# Patient Record
Sex: Male | Born: 1950 | Race: White | Hispanic: No | Marital: Single | Smoking: Current every day smoker
Health system: Southern US, Community
[De-identification: ages and names within clinical notes are randomized; demographics above are authoritative.]

## PROBLEM LIST (undated history)

## (undated) DIAGNOSIS — N289 Disorder of kidney and ureter, unspecified: Secondary | ICD-10-CM

## (undated) DIAGNOSIS — J189 Pneumonia, unspecified organism: Secondary | ICD-10-CM

## (undated) DIAGNOSIS — I509 Heart failure, unspecified: Secondary | ICD-10-CM

## (undated) DIAGNOSIS — D649 Anemia, unspecified: Secondary | ICD-10-CM

## (undated) DIAGNOSIS — Z992 Dependence on renal dialysis: Secondary | ICD-10-CM

## (undated) DIAGNOSIS — Z972 Presence of dental prosthetic device (complete) (partial): Secondary | ICD-10-CM

## (undated) DIAGNOSIS — K08109 Complete loss of teeth, unspecified cause, unspecified class: Secondary | ICD-10-CM

## (undated) DIAGNOSIS — J449 Chronic obstructive pulmonary disease, unspecified: Secondary | ICD-10-CM

## (undated) DIAGNOSIS — I639 Cerebral infarction, unspecified: Secondary | ICD-10-CM

## (undated) DIAGNOSIS — I10 Essential (primary) hypertension: Secondary | ICD-10-CM

## (undated) HISTORY — PX: MULTIPLE TOOTH EXTRACTIONS: SHX2053

## (undated) HISTORY — PX: COLON SURGERY: SHX602

---

## 2017-09-11 ENCOUNTER — Encounter (HOSPITAL_COMMUNITY): Payer: Self-pay | Admitting: *Deleted

## 2017-09-11 ENCOUNTER — Emergency Department (HOSPITAL_COMMUNITY): Payer: Medicare Other

## 2017-09-11 ENCOUNTER — Inpatient Hospital Stay (HOSPITAL_COMMUNITY)
Admission: EM | Admit: 2017-09-11 | Discharge: 2017-09-17 | DRG: 690 | Disposition: A | Discharge status: Home / Self Care | Payer: Medicare Other | Attending: Family Medicine | Admitting: Family Medicine

## 2017-09-11 ENCOUNTER — Inpatient Hospital Stay (HOSPITAL_COMMUNITY): Payer: Medicare Other

## 2017-09-11 DIAGNOSIS — Z1611 Resistance to penicillins: Secondary | ICD-10-CM | POA: Diagnosis present

## 2017-09-11 DIAGNOSIS — N10 Acute tubulo-interstitial nephritis: Secondary | ICD-10-CM | POA: Diagnosis present

## 2017-09-11 DIAGNOSIS — Z590 Homelessness: Secondary | ICD-10-CM | POA: Diagnosis not present

## 2017-09-11 DIAGNOSIS — I132 Hypertensive heart and chronic kidney disease with heart failure and with stage 5 chronic kidney disease, or end stage renal disease: Secondary | ICD-10-CM | POA: Diagnosis present

## 2017-09-11 DIAGNOSIS — R338 Other retention of urine: Secondary | ICD-10-CM | POA: Diagnosis present

## 2017-09-11 DIAGNOSIS — I701 Atherosclerosis of renal artery: Secondary | ICD-10-CM | POA: Diagnosis present

## 2017-09-11 DIAGNOSIS — I1311 Hypertensive heart and chronic kidney disease without heart failure, with stage 5 chronic kidney disease, or end stage renal disease: Secondary | ICD-10-CM | POA: Insufficient documentation

## 2017-09-11 DIAGNOSIS — I5032 Chronic diastolic (congestive) heart failure: Secondary | ICD-10-CM | POA: Diagnosis present

## 2017-09-11 DIAGNOSIS — D631 Anemia in chronic kidney disease: Secondary | ICD-10-CM | POA: Diagnosis present

## 2017-09-11 DIAGNOSIS — I160 Hypertensive urgency: Secondary | ICD-10-CM | POA: Diagnosis present

## 2017-09-11 DIAGNOSIS — Z5329 Procedure and treatment not carried out because of patient's decision for other reasons: Secondary | ICD-10-CM | POA: Diagnosis not present

## 2017-09-11 DIAGNOSIS — N401 Enlarged prostate with lower urinary tract symptoms: Secondary | ICD-10-CM | POA: Diagnosis present

## 2017-09-11 DIAGNOSIS — N179 Acute kidney failure, unspecified: Secondary | ICD-10-CM | POA: Diagnosis present

## 2017-09-11 DIAGNOSIS — N12 Tubulo-interstitial nephritis, not specified as acute or chronic: Secondary | ICD-10-CM

## 2017-09-11 DIAGNOSIS — R0602 Shortness of breath: Secondary | ICD-10-CM | POA: Diagnosis present

## 2017-09-11 DIAGNOSIS — N2889 Other specified disorders of kidney and ureter: Secondary | ICD-10-CM

## 2017-09-11 DIAGNOSIS — Z87891 Personal history of nicotine dependence: Secondary | ICD-10-CM | POA: Diagnosis not present

## 2017-09-11 DIAGNOSIS — J449 Chronic obstructive pulmonary disease, unspecified: Secondary | ICD-10-CM | POA: Diagnosis present

## 2017-09-11 DIAGNOSIS — I151 Hypertension secondary to other renal disorders: Secondary | ICD-10-CM

## 2017-09-11 DIAGNOSIS — I10 Essential (primary) hypertension: Secondary | ICD-10-CM

## 2017-09-11 DIAGNOSIS — N185 Chronic kidney disease, stage 5: Secondary | ICD-10-CM | POA: Diagnosis present

## 2017-09-11 DIAGNOSIS — F1010 Alcohol abuse, uncomplicated: Secondary | ICD-10-CM | POA: Diagnosis present

## 2017-09-11 DIAGNOSIS — B9620 Unspecified Escherichia coli [E. coli] as the cause of diseases classified elsewhere: Secondary | ICD-10-CM | POA: Diagnosis present

## 2017-09-11 DIAGNOSIS — N189 Chronic kidney disease, unspecified: Secondary | ICD-10-CM

## 2017-09-11 DIAGNOSIS — R0902 Hypoxemia: Secondary | ICD-10-CM

## 2017-09-11 DIAGNOSIS — N419 Inflammatory disease of prostate, unspecified: Secondary | ICD-10-CM

## 2017-09-11 HISTORY — DX: Disorder of kidney and ureter, unspecified: N28.9

## 2017-09-11 HISTORY — DX: Chronic obstructive pulmonary disease, unspecified: J44.9

## 2017-09-11 HISTORY — DX: Essential (primary) hypertension: I10

## 2017-09-11 HISTORY — DX: Heart failure, unspecified: I50.9

## 2017-09-11 HISTORY — DX: Tubulo-interstitial nephritis, not specified as acute or chronic: N12

## 2017-09-11 LAB — CBC
HEMATOCRIT: 32.8 % — AB (ref 39.0–52.0)
Hemoglobin: 10.8 g/dL — ABNORMAL LOW (ref 13.0–17.0)
MCH: 29.3 pg (ref 26.0–34.0)
MCHC: 32.9 g/dL (ref 30.0–36.0)
MCV: 88.9 fL (ref 78.0–100.0)
Platelets: 173 10*3/uL (ref 150–400)
RBC: 3.69 MIL/uL — ABNORMAL LOW (ref 4.22–5.81)
RDW: 15.6 % — ABNORMAL HIGH (ref 11.5–15.5)
WBC: 12.4 10*3/uL — AB (ref 4.0–10.5)

## 2017-09-11 LAB — COMPREHENSIVE METABOLIC PANEL
ALK PHOS: 83 U/L (ref 38–126)
ALT: 15 U/L — ABNORMAL LOW (ref 17–63)
ANION GAP: 9 (ref 5–15)
AST: 26 U/L (ref 15–41)
Albumin: 3.5 g/dL (ref 3.5–5.0)
BILIRUBIN TOTAL: 0.6 mg/dL (ref 0.3–1.2)
BUN: 42 mg/dL — AB (ref 6–20)
CALCIUM: 8.9 mg/dL (ref 8.9–10.3)
CO2: 19 mmol/L — ABNORMAL LOW (ref 22–32)
Chloride: 108 mmol/L (ref 101–111)
Creatinine, Ser: 3.64 mg/dL — ABNORMAL HIGH (ref 0.61–1.24)
GFR calc Af Amer: 18 mL/min — ABNORMAL LOW (ref >60)
GFR, EST NON AFRICAN AMERICAN: 16 mL/min — AB (ref >60)
GLUCOSE: 120 mg/dL — AB (ref 65–99)
POTASSIUM: 4.1 mmol/L (ref 3.5–5.1)
Sodium: 136 mmol/L (ref 135–145)
TOTAL PROTEIN: 7.2 g/dL (ref 6.5–8.1)

## 2017-09-11 LAB — URINALYSIS, ROUTINE W REFLEX MICROSCOPIC
BILIRUBIN URINE: NEGATIVE
Glucose, UA: NEGATIVE mg/dL
KETONES UR: NEGATIVE mg/dL
NITRITE: POSITIVE — AB
SPECIFIC GRAVITY, URINE: 1.011 (ref 1.005–1.030)
pH: 5 (ref 5.0–8.0)

## 2017-09-11 LAB — ETHANOL

## 2017-09-11 LAB — I-STAT CG4 LACTIC ACID, ED: LACTIC ACID, VENOUS: 0.72 mmol/L (ref 0.5–1.9)

## 2017-09-11 LAB — LIPASE, BLOOD: Lipase: 28 U/L (ref 11–51)

## 2017-09-11 MED ORDER — TAMSULOSIN HCL 0.4 MG PO CAPS
0.4000 mg | ORAL_CAPSULE | Freq: Every day | ORAL | Status: DC
  Administered 2017-09-11 – 2017-09-17 (×7): 0.4 mg via ORAL
  Filled 2017-09-11 (×7): qty 1

## 2017-09-11 MED ORDER — SODIUM CHLORIDE 0.9 % IV BOLUS (SEPSIS)
1000.0000 mL | Freq: Once | INTRAVENOUS | Status: DC
  Administered 2017-09-11: 1000 mL via INTRAVENOUS

## 2017-09-11 MED ORDER — DEXTROSE 5 % IV SOLN
1.0000 g | Freq: Once | INTRAVENOUS | Status: AC
  Administered 2017-09-11: 1 g via INTRAVENOUS
  Filled 2017-09-11: qty 10

## 2017-09-11 MED ORDER — LORAZEPAM 2 MG/ML IJ SOLN
1.0000 mg | Freq: Four times a day (QID) | INTRAMUSCULAR | Status: AC | PRN
  Administered 2017-09-11: 1 mg via INTRAVENOUS
  Filled 2017-09-11: qty 1

## 2017-09-11 MED ORDER — ONDANSETRON HCL 4 MG PO TABS: 4.0000 mg | ORAL_TABLET | Freq: Four times a day (QID) | ORAL | Status: DC | PRN

## 2017-09-11 MED ORDER — HEPARIN SODIUM (PORCINE) 5000 UNIT/ML IJ SOLN
5000.0000 [IU] | Freq: Three times a day (TID) | INTRAMUSCULAR | Status: DC
  Filled 2017-09-11 (×6): qty 1

## 2017-09-11 MED ORDER — ONDANSETRON HCL 4 MG/2ML IJ SOLN
4.0000 mg | Freq: Once | INTRAMUSCULAR | Status: AC
  Administered 2017-09-11: 4 mg via INTRAVENOUS
  Filled 2017-09-11: qty 2

## 2017-09-11 MED ORDER — LEVOFLOXACIN 500 MG PO TABS: 500.0000 mg | ORAL_TABLET | ORAL | 0 refills | Status: DC

## 2017-09-11 MED ORDER — IPRATROPIUM-ALBUTEROL 0.5-2.5 (3) MG/3ML IN SOLN
3.0000 mL | Freq: Four times a day (QID) | RESPIRATORY_TRACT | Status: DC
  Administered 2017-09-12: 3 mL via RESPIRATORY_TRACT
  Filled 2017-09-11: qty 3

## 2017-09-11 MED ORDER — LEVOFLOXACIN IN D5W 500 MG/100ML IV SOLN
500.0000 mg | INTRAVENOUS | Status: DC
  Administered 2017-09-11: 500 mg via INTRAVENOUS
  Filled 2017-09-11: qty 100

## 2017-09-11 MED ORDER — FOLIC ACID 1 MG PO TABS
1.0000 mg | ORAL_TABLET | Freq: Every day | ORAL | Status: DC
  Administered 2017-09-11 – 2017-09-17 (×7): 1 mg via ORAL
  Filled 2017-09-11 (×7): qty 1

## 2017-09-11 MED ORDER — ACETAMINOPHEN 325 MG PO TABS
650.0000 mg | ORAL_TABLET | Freq: Four times a day (QID) | ORAL | Status: DC | PRN
  Administered 2017-09-11 – 2017-09-15 (×3): 650 mg via ORAL
  Filled 2017-09-11 (×3): qty 2

## 2017-09-11 MED ORDER — IPRATROPIUM-ALBUTEROL 0.5-2.5 (3) MG/3ML IN SOLN
3.0000 mL | Freq: Once | RESPIRATORY_TRACT | Status: AC
  Administered 2017-09-11: 3 mL via RESPIRATORY_TRACT
  Filled 2017-09-11: qty 3

## 2017-09-11 MED ORDER — ACETAMINOPHEN 650 MG RE SUPP: 650.0000 mg | Freq: Four times a day (QID) | RECTAL | Status: DC | PRN

## 2017-09-11 MED ORDER — HYDRALAZINE HCL 25 MG PO TABS
25.0000 mg | ORAL_TABLET | Freq: Once | ORAL | Status: AC
  Administered 2017-09-11: 25 mg via ORAL
  Filled 2017-09-11: qty 1

## 2017-09-11 MED ORDER — THIAMINE HCL 100 MG/ML IJ SOLN: 100.0000 mg | Freq: Every day | INTRAMUSCULAR | Status: DC

## 2017-09-11 MED ORDER — ONDANSETRON HCL 4 MG/2ML IJ SOLN
4.0000 mg | Freq: Four times a day (QID) | INTRAMUSCULAR | Status: DC | PRN
  Administered 2017-09-12: 4 mg via INTRAVENOUS
  Filled 2017-09-11 (×2): qty 2

## 2017-09-11 MED ORDER — ADULT MULTIVITAMIN W/MINERALS CH
1.0000 | ORAL_TABLET | Freq: Every day | ORAL | Status: DC
  Administered 2017-09-11 – 2017-09-17 (×7): 1 via ORAL
  Filled 2017-09-11 (×7): qty 1

## 2017-09-11 MED ORDER — HYDRALAZINE HCL 25 MG PO TABS: 50.0000 mg | ORAL_TABLET | Freq: Two times a day (BID) | ORAL | Status: DC

## 2017-09-11 MED ORDER — CARVEDILOL 12.5 MG PO TABS
12.5000 mg | ORAL_TABLET | Freq: Two times a day (BID) | ORAL | Status: DC
  Administered 2017-09-12 – 2017-09-15 (×6): 12.5 mg via ORAL
  Filled 2017-09-11 (×8): qty 1

## 2017-09-11 MED ORDER — VITAMIN B-1 100 MG PO TABS
100.0000 mg | ORAL_TABLET | Freq: Every day | ORAL | Status: DC
  Administered 2017-09-11 – 2017-09-17 (×7): 100 mg via ORAL
  Filled 2017-09-11 (×7): qty 1

## 2017-09-11 MED ORDER — ISOSORBIDE MONONITRATE ER 30 MG PO TB24
30.0000 mg | ORAL_TABLET | Freq: Every morning | ORAL | Status: DC
  Administered 2017-09-12 – 2017-09-17 (×6): 30 mg via ORAL
  Filled 2017-09-11 (×6): qty 1

## 2017-09-11 MED ORDER — HYDRALAZINE HCL 50 MG PO TABS
50.0000 mg | ORAL_TABLET | Freq: Two times a day (BID) | ORAL | Status: DC
  Administered 2017-09-12 (×2): 50 mg via ORAL
  Filled 2017-09-11 (×2): qty 1

## 2017-09-11 MED ORDER — POLYETHYLENE GLYCOL 3350 17 G PO PACK: 17.0000 g | PACK | Freq: Every day | ORAL | Status: DC | PRN

## 2017-09-11 MED ORDER — DEXTROSE 5 % IV SOLN
1.0000 g | INTRAVENOUS | Status: DC
  Administered 2017-09-12 – 2017-09-13 (×2): 1 g via INTRAVENOUS
  Filled 2017-09-11 (×3): qty 10

## 2017-09-11 MED ORDER — LORAZEPAM 1 MG PO TABS: 1.0000 mg | ORAL_TABLET | Freq: Four times a day (QID) | ORAL | Status: AC | PRN

## 2017-09-11 MED ORDER — SODIUM CHLORIDE 0.9 % IV BOLUS (SEPSIS)
1000.0000 mL | Freq: Once | INTRAVENOUS | Status: AC
  Administered 2017-09-11: 1000 mL via INTRAVENOUS

## 2017-09-11 MED ORDER — ONDANSETRON HCL 4 MG PO TABS: 4.0000 mg | ORAL_TABLET | Freq: Four times a day (QID) | ORAL | 0 refills | Status: DC | PRN

## 2017-09-11 MED ORDER — LABETALOL HCL 5 MG/ML IV SOLN
10.0000 mg | Freq: Once | INTRAVENOUS | Status: AC
  Administered 2017-09-11: 10 mg via INTRAVENOUS
  Filled 2017-09-11: qty 4

## 2017-09-11 NOTE — Progress Notes (Signed)
Pharmacy Antibiotic Note  Travonte Byard is a 66 y.o. male admitted on 09/11/2017 with UTI.  Pharmacy has been consulted for Ceftriaxone dosing.  Plan: Ceftriaxone 1g IV X1 received in ED, then ceftriaxone 1g every 24 hours. Ceftriaxone is not renally adjusted. Pharmacy will sign off on consult.      Temp (24hrs), Avg:99.1 F (37.3 C), Min:99.1 F (37.3 C), Max:99.1 F (37.3 C)   Recent Labs Lab 09/11/17 1111 09/11/17 1356  WBC 12.4*  --   CREATININE 3.64*  --   LATICACIDVEN  --  0.72    CrCl cannot be calculated (Unknown ideal weight.).    No Known Allergies   Thank you for allowing pharmacy to be a part of this patient's care.  Jerrye Noble, PharmD Candidate 09/11/2017 8:53 PM

## 2017-09-11 NOTE — ED Notes (Signed)
Unsuccessful attempt at giving report to 2W. 

## 2017-09-11 NOTE — ED Notes (Signed)
Small condom catheter administered to pt. at 18:17.

## 2017-09-11 NOTE — ED Notes (Signed)
Per CT staff patient vomited and filled up 3 emesis bags with red tinged emesis. MD made aware and nausea meds ordered

## 2017-09-11 NOTE — ED Triage Notes (Signed)
To ED for eval of left lower abd pain and weakness since waking this am. States yesterday he walked to the store and felt fine. This weakness and pain started last pm. Vomiting this am. No pain currently but states it will come and go

## 2017-09-11 NOTE — ED Notes (Signed)
Kerry Underwood--[phone] [place], [place]  Pt is able to live with him.

## 2017-09-11 NOTE — H&P (Signed)
[facility] Admission History and Physical Service Pager: [phone]  Patient name: Kerry Underwood Medical record number: [id number] Date of birth: 1951-05-02 Age: 66 y.o. Gender: male  Primary Care Provider: Patient, No Pcp Per Consultants: none Code Status:   Chief Complaint: abdominal pain  Assessment and Plan: Kerry Underwood is a 66 y.o. male with PMH of HTN, COPD, HFpEF, BPH, CKD3, homelessness presenting with abdominal pain and dysuria.    # Pyelonephritis  Patient presents with 1 day history of abdominal and back pain with urination, foul smelling urine, dysuria. CT scan neg for stone but shows L perinephric edema and stranding with slight enlargement of L ureter. Patient is afebrile but with elevated white count to 12.4 and UA positive for leukocytes and nitrites. Does not meet sepsis criteria. He has CVA tenderness on exam. Given timecourse, imaging, and foul smelling urine the most likely diagnosis is pyelonephritis. Will plan to admit for IV antibiotics. Received dose of ceftriaxone in ed. Is possible that patient has accompanying stone that was difficult to visualize on CT scan. - admit to family medicine teaching service, appropriate for floor, Dr. Gwendlyn Deutscher - regular diet as tolerated - vitals signs per unit routine - Ceftriaxone 1g daily - tylenol prn for pain or fever - zofran prn for nausea - urine rapid drug screen - f/u urine culture for sensitivities - cbc, bmp in am  # Diastolic heart failure Patient with diastolic heart failure per cath at last admission at [facility] in June. Unclear what pulmonary artery pressure was at that time. LVEF 55%. Will be hesitant to give patient any fluids during admission. Patient desatted down to below 90s and became acutely confused with rhonchi/wheezes after laying flat. He had a difficult time recognizing family and developed difficulty with speech. DDX includes CHF exacerbation but no signs of fluid overload.  Patient also found to have CIWA score of 16 and was given dose of prn ativan. This falsely elevated score most likely to presenting symptoms. Patient with ethanol level of <10 so will stop CIWA precautions. - avoid giving fluids if possible - Sit upright in bed - bnp during admission - consider repeat echo - continuous pulse ox - chest xray to evaluate for vascular congestion - hold off lasix for now as appears euvolemic  #COPD. DDx for oxygen desaturation include possible COPD exacerbation thought patient has had no cough and had a transient episode of increased work of breathing. He is not on any controlled medications and is not requiring any oxygen supplementation at this time - duonebs q6prn - needs outpatient follow up for PFTs  # Hypertensive urgency, improved. BP now 152/87. Patient with recently diagnosed hypertension during admission at OSH around 3 months ago. Hypertensive up to 233/103 at presentation. Received on dose of labetolol and one dose of hydralazine in ed. Will continue home meds, plan to give prns if hypertensive during admission. - continue home coreg - continue home hydralazine - continue home imdur  # CKD Baseline creatinine appears to be around 3.5 from OSH records. Creatinine at 3.6 at presentation. Has not seen nephrologist - avoid nephrotoxic meds - monitor cr with daily bmp  # BPH. Has had difficuly urinating for the past year with weak stream. Was on flomax during hospital stay in June but not continued as outpatient. - restart flomax  #Poor social situation. Homelessness and questionable h/o ETOH abuse. Denies ETOH used in years on admission but had reportedly told ED provider that drinks 6 pack per  day. ETOH level <10. - Social work consulted - monitor under CIWA protocol  PMH is significant for copd, bph, ckd III, and hypertension.  FEN/GI: regular diet Prophylaxis: heparin 5000U, ciwa  Disposition: home  History of Present Illness:  Kerry Underwood is a 66 y.o. male presenting with one day history of nausea, vomiting, and abdominal pain. Patient had sudden onset of nausea and subsequent vomiting after dinner on 10/2. His vomit was Non-bloody and non-bilious and was mostly "clear". He said that he vomited 3 times prior to coming to the emergency department and they were all the same consistency and color. After the first episode of vomiting he noticed that the whenever he tried to urinate he would have pain in his left side. He would have difficulty starting a stream, and that whenever he would stop urinating the pain would dissipate. He has had these problems for around one year. No h/o kidney stones. He has noticed a very foul odor with urination since his pain began yesterday. This abdominal pain is a sharp, twisting pain that feels like a stabbing sensation.   Workup with the ed consisted of a cbc, bmp, LA, and CT abdomen and pelvis. CBC, BMP significant for hgb of 10.8, wbc 12.4, creatinine 3.64, bun 42. Lactic acid 0.72. CT showed  Left perinephric edema and fat stranding. No hydronephrosis, slight enlargement of the mid distal left ureter however no definitive ureteral stone visualized. Patient with atrophic right kidney. Patient received one dose hydralazine, and labetalol in ed. Patient received one dose of cetriaxone in ed. UA obtained positive for nitrites and a large amount of LE.   Review Of Systems: Per HPI with the following additions:  Review of Systems  Constitutional: Negative for chills and fever.  Respiratory: Negative for cough, shortness of breath and wheezing.   Cardiovascular: Negative for chest pain and palpitations.  Gastrointestinal: Positive for abdominal pain, nausea and vomiting.  Genitourinary: Positive for dysuria and urgency. Negative for hematuria.    There are no active problems to display for this patient.   Past Medical History: Past Medical History:  Diagnosis Date  . COPD (chronic obstructive  pulmonary disease) ([place])   . Hypertension   . Renal disorder   BPH CKD 3 CHF   Past Surgical History: History reviewed. No pertinent surgical history.  Social History: Social History  Substance Use Topics  . Smoking status: Former Research scientist (life sciences)  . Smokeless tobacco: Never Used  . Alcohol use No   Additional social history: denies ETOH "long time". 6-7 cig a day. No recreational drug use. Not sexually active in the past 8 years . No h/o STDs Please also refer to relevant sections of EMR.  Family History: Patient denies any family history   Allergies and Medications: No Known Allergies No current facility-administered medications on file prior to encounter.    No current outpatient prescriptions on file prior to encounter.  hydralazine 50mg  BID Coreg 12.5mg  BID  ASA 325mg  qd  imdur ER 30mg  qd   Objective: BP (!) 219/90   Pulse 75   Temp 99.1 F (37.3 C) (Oral)   Resp (!) 21   SpO2 94%  Physical Exam  Constitutional: He is oriented to person, place, and time.  Thin appearing male, no acute distress  HENT:  Head: Normocephalic and atraumatic.  Right Ear: External ear normal.  Left Ear: External ear normal.  Eyes: Pupils are equal, round, and reactive to light. Conjunctivae and EOM are normal.  Neck: Normal range of  motion. Thyromegaly present.  Cardiovascular: Normal rate, regular rhythm, normal heart sounds and intact distal pulses.   Palpable pt/dp bilaterally, palpable radial pulse bilaterally  Pulmonary/Chest: Effort normal and breath sounds normal. No respiratory distress. He has no wheezes.  Abdominal: Soft. He exhibits no distension. There is tenderness (tender to deep palpation in RLQ). There is no rebound.  Genitourinary:  Genitourinary Comments: Patient with costovertebral angle tenderness on left side  Musculoskeletal: Normal range of motion. He exhibits no edema or deformity.  Neurological: He is alert and oriented to person, place, and time. No cranial nerve  deficit. Coordination normal. GCS score is 15.  Skin: Skin is warm. No erythema.  Psychiatric: Affect and judgment normal.   Labs and Imaging: CBC BMET   Recent Labs Lab 09/11/17 1111  WBC 12.4*  HGB 10.8*  HCT 32.8*  PLT 173    Recent Labs Lab 09/11/17 1111  NA 136  K 4.1  CL 108  CO2 19*  BUN 42*  CREATININE 3.64*  GLUCOSE 120*  CALCIUM 8.9     Lactic Acid, Venous    Component Value Date/Time   LATICACIDVEN 0.72 09/11/2017 1356    Urinalysis    Component Value Date/Time   COLORURINE YELLOW 09/11/2017 1155   APPEARANCEUR CLOUDY (A) 09/11/2017 1155   LABSPEC 1.011 09/11/2017 1155   PHURINE 5.0 09/11/2017 1155   GLUCOSEU NEGATIVE 09/11/2017 1155   HGBUR SMALL (A) 09/11/2017 1155   BILIRUBINUR NEGATIVE 09/11/2017 1155   KETONESUR NEGATIVE 09/11/2017 1155   PROTEINUR >=300 (A) 09/11/2017 1155   NITRITE POSITIVE (A) 09/11/2017 1155   LEUKOCYTESUR LARGE (A) 09/11/2017 1155     Ct Renal Stone Study  Result Date: 09/11/2017 CLINICAL DATA:  Left lower abdominal pain EXAM: CT ABDOMEN AND PELVIS WITHOUT CONTRAST TECHNIQUE: Multidetector CT imaging of the abdomen and pelvis was performed following the standard protocol without IV contrast. COMPARISON:  None. FINDINGS: Lower chest: Lung bases demonstrate no acute consolidation or effusion. Normal heart size. Small hiatal hernia. Hepatobiliary: Lobulated liver contour. No focal hepatic abnormality, calcified gallstone or biliary dilatation Pancreas: Unremarkable. No pancreatic ductal dilatation or surrounding inflammatory changes. Spleen: Normal in size without focal abnormality. Adrenals/Urinary Tract: Adrenal glands are within normal limits. Markedly atrophic right kidney without hydronephrosis. Punctate stone in the upper pole of the right kidney. Moderate-to-marked left perinephric edema and fat stranding. 2 cm low-density lesion mid left kidney. No significant left hydronephrosis. Slightly enlarged mid to distal left  ureter. No stones along the course of the left ureter. Slightly thick-walled appearance of the bladder Stomach/Bowel: Stomach is within normal limits. Appendix appears normal. No evidence of bowel wall thickening, distention, or inflammatory changes. Vascular/Lymphatic: Aortic atherosclerosis. Mild retroperitoneal adenopathy with left periaortic lymph node measuring up to 9 mm. Reproductive: Slightly enlarged prostate Other: Negative for free air or free fluid. Within the bilateral inguinal canals Musculoskeletal: No acute or suspicious bone lesion IMPRESSION: 1. Moderate-to-marked left perinephric edema and fat stranding. No significant left hydronephrosis, slight enlargement of the mid to distal left ureter however no definitive ureteral stone is visualized. Differential considerations include recently passed stone ; could also consider pyelonephritis. 2. Thick-walled appearance of the bladder suggesting cystitis or chronic obstruction 3. Very atrophic right kidney without hydronephrosis. Punctate stone in the upper pole of the right kidney 4. 2 cm hypodensity mid pole left kidney, possible cyst but further evaluation limited without contrast 5. Lobulated liver contour, query cirrhosis or hepatocellular disease Electronically Signed   By: Madie Reno.D.  On: 09/11/2017 16:55    Guadalupe Dawn, MD 09/11/2017, 6:37 PM PGY-1, [facility] Intern pager: [phone], text pages welcome  FPTS Upper-Level Resident Addendum  I have independently interviewed and examined the patient. I have discussed the above with the original author and agree with their documentation. My edits for correction/addition/clarification are in blue. Please see also any attending notes.   Bufford Lope, DO PGY-2, [facility] 09/11/2017 11:58 PM  [facility] Service pager: [phone] (text pages welcome through [facility])

## 2017-09-11 NOTE — ED Notes (Signed)
Patient has had intermittent episodes of shortness of breath that have been relieved with readjusting patient in bed and deep breathing. Patient lungs diminished and mild rhonchi throughout, family notes edema in bilateral hands and sts pt has hx of CHF. Admitting residents made aware

## 2017-09-11 NOTE — ED Notes (Addendum)
Family friend that patient is staying with presents to visit and for update on patients status. Per Family friend patient does not have own place to stay but has been staying with them with plan to ride around on his [place] and visit different friends.  Rosalio Loud [phone]   Fulton Reek [employer] (friend) [phone]   Earnie Larsson (friend has his [place]) [phone]

## 2017-09-11 NOTE — ED Provider Notes (Signed)
[facility] DEPT Provider Note   CSN: [id number] Arrival date & time: 09/11/17  1045     History   Chief Complaint Chief Complaint  Patient presents with  . Abdominal Pain  . Emesis   HPI   Blood pressure (!) 149/75, pulse 61, temperature 99.1 F (37.3 C), temperature source Oral, resp. rate 17, SpO2 99 %.  Kerry Underwood is a 66 y.o. male complaining of Generalized fatigue, dysuria, difficulty initiating stream with a severe colicky left lower quadrant pain and multiple episodes of emesis onset this morning. Patient denies fevers, chills, history of kidney stones, diarrhea, melena, hematochezia. On review of systems he notes he has bowel movements only once every 4 days and he does state that they are painful.   Past Medical History:  Diagnosis Date  . Hypertension   . Renal disorder     There are no active problems to display for this patient.   History reviewed. No pertinent surgical history.     Home Medications    Prior to Admission medications   Medication Sig Start Date End Date Taking? Authorizing Provider  levofloxacin (LEVAQUIN) 500 MG tablet Take 1 tablet (500 mg total) by mouth every other day. 09/11/17   Elleanor Guyett, Elmyra Ricks, PA-C  ondansetron (ZOFRAN) 4 MG tablet Take 1 tablet (4 mg total) by mouth every 6 (six) hours as needed for nausea or vomiting. 09/11/17   Daxson Reffett, Charna Elizabeth    Family History No family history on file.  Social History Social History  Substance Use Topics  . Smoking status: Former Research scientist (life sciences)  . Smokeless tobacco: Never Used  . Alcohol use No     Allergies   Patient has no allergy information on record.   Review of Systems Review of Systems  A complete review of systems was obtained and all systems are negative except as noted in the HPI and PMH.    Physical Exam Updated Vital Signs BP (!) 187/86   Pulse 80   Temp 99.1 F (37.3 C) (Oral)   Resp (!) 23   SpO2 93%   Physical Exam  Constitutional: He is oriented  to person, place, and time. He appears well-developed and well-nourished. No distress.  HENT:  Head: Normocephalic and atraumatic.  Mouth/Throat: Oropharynx is clear and moist.  Eyes: Pupils are equal, round, and reactive to light. Conjunctivae and EOM are normal.  Neck: Normal range of motion.  Cardiovascular: Normal rate, regular rhythm and intact distal pulses.   Pulmonary/Chest: Effort normal. He has wheezes.  Abdominal: Soft. He exhibits no distension and no mass. There is no tenderness. There is no rebound and no guarding. No hernia.  Genitourinary:  Genitourinary Comments: No CVA tenderness to percussion bilaterally.  Musculoskeletal: Normal range of motion.  Neurological: He is alert and oriented to person, place, and time.  Skin: He is not diaphoretic.  Psychiatric: He has a normal mood and affect.  Nursing note and vitals reviewed.    ED Treatments / Results  Labs (all labs ordered are listed, but only abnormal results are displayed) Labs Reviewed  COMPREHENSIVE METABOLIC PANEL - Abnormal; Notable for the following:       Result Value   CO2 19 (*)    Glucose, Bld 120 (*)    BUN 42 (*)    Creatinine, Ser 3.64 (*)    ALT 15 (*)    GFR calc non Af Amer 16 (*)    GFR calc Af Amer 18 (*)    All other components within normal  limits  CBC - Abnormal; Notable for the following:    WBC 12.4 (*)    RBC 3.69 (*)    Hemoglobin 10.8 (*)    HCT 32.8 (*)    RDW 15.6 (*)    All other components within normal limits  URINALYSIS, ROUTINE W REFLEX MICROSCOPIC - Abnormal; Notable for the following:    APPearance CLOUDY (*)    Hgb urine dipstick SMALL (*)    Protein, ur >=300 (*)    Nitrite POSITIVE (*)    Leukocytes, UA LARGE (*)    Bacteria, UA MANY (*)    Squamous Epithelial / LPF 0-5 (*)    All other components within normal limits  URINE CULTURE  LIPASE, BLOOD  I-STAT CG4 LACTIC ACID, ED    EKG  EKG Interpretation None       Radiology No results  found.  Procedures Procedures (including critical care time)  Medications Ordered in ED Medications  levofloxacin (LEVAQUIN) IVPB 500 mg (500 mg Intravenous New Bag/Given 09/11/17 1559)  sodium chloride 0.9 % bolus 1,000 mL (0 mLs Intravenous Stopped 09/11/17 1509)  ondansetron (ZOFRAN) injection 4 mg (4 mg Intravenous Given 09/11/17 1352)  ipratropium-albuterol (DUONEB) 0.5-2.5 (3) MG/3ML nebulizer solution 3 mL (3 mLs Nebulization Given 09/11/17 1514)     Initial Impression / Assessment and Plan / ED Course  I have reviewed the triage vital signs and the nursing notes.  Pertinent labs & imaging results that were available during my care of the patient were reviewed by me and considered in my medical decision making (see chart for details).     Vitals:   09/11/17 1430 09/11/17 1445 09/11/17 1500 09/11/17 1530  BP: (!) 145/72 (!) 155/75 (!) 149/75 (!) 187/86  Pulse: 60 61 61 80  Resp: 17 12 17  (!) 23  Temp:      TempSrc:      SpO2: 100% 100% 99% 93%    Medications  levofloxacin (LEVAQUIN) IVPB 500 mg (500 mg Intravenous New Bag/Given 09/11/17 1559)  sodium chloride 0.9 % bolus 1,000 mL (0 mLs Intravenous Stopped 09/11/17 1509)  ondansetron (ZOFRAN) injection 4 mg (4 mg Intravenous Given 09/11/17 1352)  ipratropium-albuterol (DUONEB) 0.5-2.5 (3) MG/3ML nebulizer solution 3 mL (3 mLs Nebulization Given 09/11/17 1514)    Kerry Underwood is 66 y.o. male presenting with Dysuria, difficult to initiate urinary stream, pain with defecation left lower quadrant pain with several episodes of emesis this morning and generalized weakness. Abdominal exam is benign. Urinalysis is consistent with infection, urine culture pending. Patient has an elevated creatinine at 3.5. Looking at care everywhere with test from with forced [facility] health this is consistent with his baseline. Discussed with pharmacist Levaquin for COPD exacerbation in addition to his presumed prostatitis. She recommends 500 mg every 48  hours. CT to evaluate for renal stone.  Case signed out to Dr. Rene Kocher at shift change: Plan is to follow-up CT abdomen pelvis, by mouth challenge and discharged with Levaquin and urology follow-up.   Final Clinical Impressions(s) / ED Diagnoses   Final diagnoses:  Prostatitis, unspecified prostatitis type    New Prescriptions New Prescriptions   LEVOFLOXACIN (LEVAQUIN) 500 MG TABLET    Take 1 tablet (500 mg total) by mouth every other day.   ONDANSETRON (ZOFRAN) 4 MG TABLET    Take 1 tablet (4 mg total) by mouth every 6 (six) hours as needed for nausea or vomiting.     Waynetta Pean 09/11/17 1637    Malvin Johns, MD  09/12/17 0705  

## 2017-09-11 NOTE — ED Notes (Signed)
On way to XR 

## 2017-09-11 NOTE — ED Provider Notes (Signed)
Pt care assumed from [facility], [place]. Pt awaiting CT scan and PO challenge.  Pt vomited in CT scanner, pt denies seeing blood. Continues to look uncomfortable and mildly ill. He states he has nausea with movement.  CT stone study showing moderate-to-marked L perinephric edema and stranding, findings of possible cystitis vs chronic obstruction, atrophic right kidney, and possible cirrhosis or hepatocellular dz. He states he drinks at least a six-pack of beer daily. Feel pt requires admission for pyelonephritis and dehydration. Pt admitted to [facility] for further management and evaluation. Pt stable at time of transfer.   Payton Emerald, MD 09/12/17 0109    Isla Pence, MD 09/12/17 2253

## 2017-09-12 ENCOUNTER — Encounter (HOSPITAL_COMMUNITY): Payer: Self-pay | Admitting: *Deleted

## 2017-09-12 DIAGNOSIS — I160 Hypertensive urgency: Secondary | ICD-10-CM

## 2017-09-12 DIAGNOSIS — N12 Tubulo-interstitial nephritis, not specified as acute or chronic: Secondary | ICD-10-CM

## 2017-09-12 LAB — RAPID URINE DRUG SCREEN, HOSP PERFORMED
Amphetamines: NOT DETECTED
BENZODIAZEPINES: NOT DETECTED
Barbiturates: NOT DETECTED
Cocaine: NOT DETECTED
Opiates: NOT DETECTED
Tetrahydrocannabinol: NOT DETECTED

## 2017-09-12 LAB — BASIC METABOLIC PANEL
ANION GAP: 10 (ref 5–15)
BUN: 47 mg/dL — AB (ref 6–20)
CO2: 17 mmol/L — ABNORMAL LOW (ref 22–32)
Calcium: 8.2 mg/dL — ABNORMAL LOW (ref 8.9–10.3)
Chloride: 110 mmol/L (ref 101–111)
Creatinine, Ser: 4.02 mg/dL — ABNORMAL HIGH (ref 0.61–1.24)
GFR calc Af Amer: 16 mL/min — ABNORMAL LOW (ref >60)
GFR, EST NON AFRICAN AMERICAN: 14 mL/min — AB (ref >60)
Glucose, Bld: 103 mg/dL — ABNORMAL HIGH (ref 65–99)
POTASSIUM: 3.7 mmol/L (ref 3.5–5.1)
SODIUM: 137 mmol/L (ref 135–145)

## 2017-09-12 LAB — CBC
HEMATOCRIT: 28.6 % — AB (ref 39.0–52.0)
Hemoglobin: 9.4 g/dL — ABNORMAL LOW (ref 13.0–17.0)
MCH: 29.3 pg (ref 26.0–34.0)
MCHC: 32.9 g/dL (ref 30.0–36.0)
MCV: 89.1 fL (ref 78.0–100.0)
Platelets: 124 10*3/uL — ABNORMAL LOW (ref 150–400)
RBC: 3.21 MIL/uL — ABNORMAL LOW (ref 4.22–5.81)
RDW: 15.6 % — AB (ref 11.5–15.5)
WBC: 10.9 10*3/uL — AB (ref 4.0–10.5)

## 2017-09-12 MED ORDER — INFLUENZA VAC SPLIT HIGH-DOSE 0.5 ML IM SUSY
0.5000 mL | PREFILLED_SYRINGE | INTRAMUSCULAR | Status: DC
  Filled 2017-09-12: qty 0.5

## 2017-09-12 MED ORDER — PNEUMOCOCCAL VAC POLYVALENT 25 MCG/0.5ML IJ INJ
0.5000 mL | INJECTION | INTRAMUSCULAR | Status: DC
  Filled 2017-09-12: qty 0.5

## 2017-09-12 MED ORDER — IPRATROPIUM-ALBUTEROL 0.5-2.5 (3) MG/3ML IN SOLN
3.0000 mL | RESPIRATORY_TRACT | Status: DC | PRN
  Administered 2017-09-13: 3 mL via RESPIRATORY_TRACT
  Filled 2017-09-12: qty 3

## 2017-09-12 NOTE — Consult Note (Signed)
[facility] Consult Note     Date: 09/12/2017                  Patient Name:  Kerry Underwood  MRN: [id number]  DOB: 05-01-1951  Age / Sex: 66 y.o., male         PCP: Patient, No Pcp Per                 Service Requesting Consult: Family Medicine Teaching Service, Dr. 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    Briefly, pt was admitted to [facility] in 05/2017 with SOB and hypertensive emergency in the setting of a diastolic CHF exacerbation.  Creatinine at that hospitalization ranged from 3.2-3.9 and pt was discharged with a Cr of 3.56.  Of note, he was not discharged on any Lasix because he was living in a non-climate controlled horse trailer at the time.    He was also noted to have BPH with some mild hydronephrosis and bladder distention on renal US.  Kidneys were also echogenic.  He initially had a Foley catheter placed and this was removed prior to discharge.    He returned to his usual activities until 2 days prior to admission on 10/3 when he began experiencing L abd pain and n/v.  Presented to [facility] where he was found to have L perinephric edema and fat stranding consistent with pyelo and a very atrophic R kidney.  Dilated L ureter.  No frank hydronephrosis.  + Thick-walled bladder.  He was admitted and started on IV antibiotics (ceftriaxone).  His creatinine was 3.64 yesterday and is now 4.02 today.    He has never seen a nephrologist as an outpatient.    Past Medical History:  Diagnosis Date  . CHF (congestive heart failure) ([place])   . COPD (chronic obstructive pulmonary disease) ([place])   . Hypertension   . Renal disorder      History reviewed. No pertinent surgical history.  History reviewed. No pertinent family history. Social History:  reports that he has quit smoking. He has never used smokeless tobacco. He reports that he does not drink alcohol or use drugs.  Allergies: No Known Allergies  Medications Prior to Admission  Medication Sig Dispense Refill  . aspirin EC 325 MG tablet Take 325 mg by mouth 2 (two) times daily.    Marland Kitchen aspirin EC 81 MG tablet Take 81 mg by mouth once as needed ("for sudden onset of chest pain").    . carvedilol (COREG) 12.5 MG tablet Take 12.5 mg by mouth 2 (two) times daily with a meal.    . hydrALAZINE (APRESOLINE) 25 MG tablet Take 50 mg by mouth 2 (two) times daily.    . isosorbide mononitrate (IMDUR) 30 MG 24 hr tablet Take 30 mg by mouth every morning.      Results for orders placed or performed during the hospital encounter of 09/11/17 (from the past 48 hour(s))  Lipase, blood     Status: None   Collection Time:  09/11/17 11:11 AM  Result Value Ref Range   Lipase 28 11 - 51 U/L  Comprehensive metabolic panel     Status: Abnormal   Collection Time: 09/11/17 11:11 AM  Result Value Ref Range   Sodium 136 135 - 145 mmol/L   Potassium 4.1 3.5 - 5.1 mmol/L   Chloride 108 101 - 111 mmol/L   CO2 19 (L) 22 - 32 mmol/L   Glucose, Bld 120 (H) 65 - 99 mg/dL   BUN 42 (H) 6 - 20 mg/dL   Creatinine, Ser 3.64 (H) 0.61 - 1.24 mg/dL   Calcium 8.9 8.9 - 10.3 mg/dL   Total Protein 7.2 6.5 - 8.1 g/dL   Albumin 3.5 3.5 - 5.0 g/dL   AST 26 15 - 41 U/L   ALT 15 (L) 17 - 63 U/L   Alkaline Phosphatase 83 38 - 126 U/L   Total Bilirubin 0.6 0.3 - 1.2 mg/dL   GFR calc non Af Amer 16 (L) >60 mL/min   GFR calc Af Amer 18 (L) >60 mL/min    Comment: (NOTE) The eGFR has been calculated using the CKD EPI equation. This calculation has not been validated in all clinical situations. eGFR's persistently <60 mL/min signify possible Chronic Kidney Disease.    Anion gap 9 5 - 15  CBC      Status: Abnormal   Collection Time: 09/11/17 11:11 AM  Result Value Ref Range   WBC 12.4 (H) 4.0 - 10.5 K/uL   RBC 3.69 (L) 4.22 - 5.81 MIL/uL   Hemoglobin 10.8 (L) 13.0 - 17.0 g/dL   HCT 32.8 (L) 39.0 - 52.0 %   MCV 88.9 78.0 - 100.0 fL   MCH 29.3 26.0 - 34.0 pg   MCHC 32.9 30.0 - 36.0 g/dL   RDW 15.6 (H) 11.5 - 15.5 %   Platelets 173 150 - 400 K/uL  Urinalysis, Routine w reflex microscopic     Status: Abnormal   Collection Time: 09/11/17 11:55 AM  Result Value Ref Range   Color, Urine YELLOW YELLOW   APPearance CLOUDY (A) CLEAR   Specific Gravity, Urine 1.011 1.005 - 1.030   pH 5.0 5.0 - 8.0   Glucose, UA NEGATIVE NEGATIVE mg/dL   Hgb urine dipstick SMALL (A) NEGATIVE   Bilirubin Urine NEGATIVE NEGATIVE   Ketones, ur NEGATIVE NEGATIVE mg/dL   Protein, ur >=300 (A) NEGATIVE mg/dL   Nitrite POSITIVE (A) NEGATIVE   Leukocytes, UA LARGE (A) NEGATIVE   RBC / HPF 6-30 0 - 5 RBC/hpf   WBC, UA TOO NUMEROUS TO COUNT 0 - 5 WBC/hpf   Bacteria, UA MANY (A) NONE SEEN   Squamous Epithelial / LPF 0-5 (A) NONE SEEN   WBC Clumps PRESENT   Urine culture     Status: Abnormal (Preliminary result)   Collection Time: 09/11/17 11:55 AM  Result Value Ref Range   Specimen Description URINE, CLEAN CATCH    Special Requests NONE    Culture >=100,000 COLONIES/mL GRAM NEGATIVE RODS (A)    Report Status PENDING   Urine rapid drug screen (hosp performed)     Status: None   Collection Time: 09/11/17 11:55 AM  Result Value Ref Range   Opiates NONE DETECTED NONE DETECTED   Cocaine NONE DETECTED NONE DETECTED   Benzodiazepines NONE DETECTED NONE DETECTED   Amphetamines NONE DETECTED NONE DETECTED   Tetrahydrocannabinol NONE DETECTED NONE DETECTED   Barbiturates NONE DETECTED NONE DETECTED    Comment:  DRUG SCREEN FOR MEDICAL PURPOSES ONLY.  IF CONFIRMATION IS NEEDED FOR ANY PURPOSE, NOTIFY LAB WITHIN 5 DAYS.        LOWEST DETECTABLE LIMITS FOR URINE DRUG SCREEN Drug Class       Cutoff  (ng/mL) Amphetamine      1000 Barbiturate      200 Benzodiazepine   254 Tricyclics       270 Opiates          300 Cocaine          300 THC              50   I-Stat CG4 Lactic Acid, ED     Status: None   Collection Time: 09/11/17  1:56 PM  Result Value Ref Range   Lactic Acid, Venous 0.72 0.5 - 1.9 mmol/L  Ethanol     Status: None   Collection Time: 09/11/17  8:13 PM  Result Value Ref Range   Alcohol, Ethyl (B) <10 <10 mg/dL    Comment:        LOWEST DETECTABLE LIMIT FOR SERUM ALCOHOL IS 10 mg/dL FOR MEDICAL PURPOSES ONLY Please note change in reference range.   Basic metabolic panel     Status: Abnormal   Collection Time: 09/12/17  2:18 AM  Result Value Ref Range   Sodium 137 135 - 145 mmol/L   Potassium 3.7 3.5 - 5.1 mmol/L   Chloride 110 101 - 111 mmol/L   CO2 17 (L) 22 - 32 mmol/L   Glucose, Bld 103 (H) 65 - 99 mg/dL   BUN 47 (H) 6 - 20 mg/dL   Creatinine, Ser 4.02 (H) 0.61 - 1.24 mg/dL   Calcium 8.2 (L) 8.9 - 10.3 mg/dL   GFR calc non Af Amer 14 (L) >60 mL/min   GFR calc Af Amer 16 (L) >60 mL/min    Comment: (NOTE) The eGFR has been calculated using the CKD EPI equation. This calculation has not been validated in all clinical situations. eGFR's persistently <60 mL/min signify possible Chronic Kidney Disease.    Anion gap 10 5 - 15  CBC     Status: Abnormal   Collection Time: 09/12/17  2:18 AM  Result Value Ref Range   WBC 10.9 (H) 4.0 - 10.5 K/uL   RBC 3.21 (L) 4.22 - 5.81 MIL/uL   Hemoglobin 9.4 (L) 13.0 - 17.0 g/dL   HCT 28.6 (L) 39.0 - 52.0 %   MCV 89.1 78.0 - 100.0 fL   MCH 29.3 26.0 - 34.0 pg   MCHC 32.9 30.0 - 36.0 g/dL   RDW 15.6 (H) 11.5 - 15.5 %   Platelets 124 (L) 150 - 400 K/uL   Dg Chest 2 View  Result Date: 09/11/2017 CLINICAL DATA:  Intermittent shortness of breath, rhonchi. Hand edema. History of CHF, COPD. EXAM: CHEST  2 VIEW COMPARISON:  None. FINDINGS: Cardiomediastinal silhouette is normal. Calcified aortic arch. No pleural effusions or  focal consolidations. Mild bronchitic changes. Trachea projects midline and there is no pneumothorax. Soft tissue planes and included osseous structures are non-suspicious. Old LEFT mid clavicle fracture. IMPRESSION: Mild bronchitic changes without focal consolidation. Electronically Signed   By: Elon Alas M.D.   On: 09/11/2017 22:09   Ct Renal Stone Study  Result Date: 09/11/2017 CLINICAL DATA:  Left lower abdominal pain EXAM: CT ABDOMEN AND PELVIS WITHOUT CONTRAST TECHNIQUE: Multidetector CT imaging of the abdomen and pelvis was performed following the standard protocol without IV contrast. COMPARISON:  None. FINDINGS: Lower  chest: Lung bases demonstrate no acute consolidation or effusion. Normal heart size. Small hiatal hernia. Hepatobiliary: Lobulated liver contour. No focal hepatic abnormality, calcified gallstone or biliary dilatation Pancreas: Unremarkable. No pancreatic ductal dilatation or surrounding inflammatory changes. Spleen: Normal in size without focal abnormality. Adrenals/Urinary Tract: Adrenal glands are within normal limits. Markedly atrophic right kidney without hydronephrosis. Punctate stone in the upper pole of the right kidney. Moderate-to-marked left perinephric edema and fat stranding. 2 cm low-density lesion mid left kidney. No significant left hydronephrosis. Slightly enlarged mid to distal left ureter. No stones along the course of the left ureter. Slightly thick-walled appearance of the bladder Stomach/Bowel: Stomach is within normal limits. Appendix appears normal. No evidence of bowel wall thickening, distention, or inflammatory changes. Vascular/Lymphatic: Aortic atherosclerosis. Mild retroperitoneal adenopathy with left periaortic lymph node measuring up to 9 mm. Reproductive: Slightly enlarged prostate Other: Negative for free air or free fluid. Within the bilateral inguinal canals Musculoskeletal: No acute or suspicious bone lesion IMPRESSION: 1. Moderate-to-marked  left perinephric edema and fat stranding. No significant left hydronephrosis, slight enlargement of the mid to distal left ureter however no definitive ureteral stone is visualized. Differential considerations include recently passed stone ; could also consider pyelonephritis. 2. Thick-walled appearance of the bladder suggesting cystitis or chronic obstruction 3. Very atrophic right kidney without hydronephrosis. Punctate stone in the upper pole of the right kidney 4. 2 cm hypodensity mid pole left kidney, possible cyst but further evaluation limited without contrast 5. Lobulated liver contour, query cirrhosis or hepatocellular disease Electronically Signed   By: Donavan Foil M.D.   On: 09/11/2017 16:55    ROS: all other systems reviewed and are negative except as per HPI  Blood pressure 111/63, pulse 62, temperature (!) 97.4 F (36.3 C), temperature source Oral, resp. rate 16, height 5' 5"  (1.651 m), weight 76.2 kg (168 lb), SpO2 99 %. Physical Exam  GEN older gentleman, a little disheveled appearing, lying in bed HEENT EOMI, PERRL, MMM NECK no JVD PULM clear bilaterally, no c/w/r CV RRR, soft systolic murmur ABD soft, hypoactive bowel sounds, mid L-sided abd pain with voluntary guarding EXT no LE edema NEURO AAO x 3 SKIN multiple healed scars, + sun-damaged  Assessment/Plan  1.  Acute kidney injury superimposed on CKD IV: His underlying CKD is likely caused by poorly controlled HTN +/- renal artery stenosis given atrophic R kidney.  He doesn't seem to have a great contribution from obstruction.  AKI is likely hemodynamically mediated in the setting of n/v and pyelonephritis.  We had a discussion together regarding dialysis.  I explained that he would more than likely require dialysis in his lifetime and it would be sooner (potentially even < 1 year) than later.  He was adamant that he did not want to undergo dialysis treatments--> he looked shocked that treatments were generally 4 hr 3x  weekly.  We discussed what to expect as people become symptomatic with kidney failure; we also discussed the natural course of kidney failure.  We will continue this dialogue- unfortunately he's never seen a nephrologist before so I think he's still fairly surprised at this news.  I'll hold off on ordering vein mapping/ consulting [facility] until we talk a little bit more with him.  I also said that it would be a good idea to see a kidney doctor once discharged to optimize anemia management and bone/ mineral metabolism.    2.  Pyelonephritis: on ceftriaxone (10/3-).  UA dirty, urine culture showing > 100,000 cfus GNR,  speciation pending. Leukocytosis improving.   3.  Diastolic CHF: appears to be relatively euvolemic at this time.  Not on Lasix.  EF 97-98% with diastolic dysfunction 08/2118.  4.  Hypertension: on hydralazine 50 mg BID, coreg 12.5 mg BID, imdur 30 mg daily, and prn labetalol.  BP are improving.  No ACEi/ARB due to severely reduced GFR.  5.  EtOH abuse: CIWA protocol, per primary  6.  Anemia: hgb 9.4, adding on iron panel and ferritin  7. Bone/ mineral: adding on PTH and Vit D  8. BPH: on Flomax.    9.  Dispo: pending improvement.     Madelon Lips, MD [facility] pgr [phone] 09/12/2017, 2:03 PM

## 2017-09-12 NOTE — Progress Notes (Signed)
Family Medicine Teaching Service Daily Progress Note Intern Pager: [phone]  Patient name: Kerry Underwood Medical record number: [id number] Date of birth: 07-07-1951 Age: 66 y.o. Gender: male  Primary Care Provider: Patient, No Pcp Per Consultants: nephrology Code Status: full  Pt Overview and Major Events to Date:  Kerry Underwood is a 66 year old male with PMH of HTN, COPD, diastolic heart failure, bph, ckdIII, homelessness presenting with abdominal pain and dysuria. Patient started on ceftriaxone on 10/3. Have asked nephrology to evaluate patient given baseline creatinine of 3.6, and atrophic right kidney.   Assessment and Plan: # pyelonephritis Patient presented with 1 day history of abdominal pain and back pain with urination, foul smelling urine, dysuria. CT scan negative for stone but shows perinephric edema and stranding with slight enlargement of ureter. Started on ceftriaxone 10/3. He has had subjective improvement in pain, no fevers, and decreased white count. Will plan to continue ctx until culture data results and can de-escalate antibiotics. - regular diet as tolerated - vitals signs per unit routine - ceftriaxone 1g daily - tylenol prn for pain and fever - zofran prn for nausea - f/u urine culture for sensitivities - cbc, bmp in am  # AKI on CKD Baseline creatinine around 3.5 per OSH records. Creatinine at 3.6 on presentation, today cr 4.0. Patient without any electrolyte abnormalities to indicate urgent need for dialysis. Given his atrophic kidney and failed outpatient follow up with nephrology will ask nephrology to evaluate patient and provide recommendations. Will need to tread thin line with diuresis if patient's diastolic heart failure worsens.  - Appreciate nephrology recs - avoid nephrotoxic medications - monitor cr with daily bmp  # Diastolic heart failure Patient with diastolic heart failure per echo at last admission in June at OSH. Patent with occasionally desat  while laying flat down in ed. Has been satting well since admission.  - will continue to monitor fluid status - avoid giving fluids if possible - consider repeat echo  # COPD DDx for oxygen desaturation include possible COPD exacerbation thought patient has had no cough and had a transient episode of increased work of breathing. He is not on any controlled medications and is not requiring any oxygen supplementation at this time - duonebs q6prn - needs outpatient follow up for PFTs  # Hypertensive urgency Presented with pressure of 233/103. Received on dose of labetolol and one dose of hydralazine in ed. Will continue home meds, plan to give prns if hypertensive during admission. Pressure currently 111/63. - continue home coreg - continue home hydralazine - continue home imdur  # BPH Has had difficuly urinating for the past year with weak stream. Was on flomax during hospital stay in June but not continued as outpatient. - continue flomax - getting post void residual  # Poor social situation Homelessness and questionable h/o ETOH abuse. Denies ETOH used in years on admission but had reportedly told ED provider that drinks 6 pack per day. ETOH level <10. - Social work consulted - monitor under CIWA protocol  FEN/GI: regular diet PPx: heparin 5000U  Disposition: unclear, patient is currently homeless   Subjective:  Doing well this morning. Has not had much appetite. Urinating appropriately. Abdominal pain with urination has improved.   Objective: Temp:  [97.4 F (36.3 C)-98.5 F (36.9 C)] 97.4 F (36.3 C) (10/04 0518) Pulse Rate:  [59-96] 62 (10/04 0518) Resp:  [12-28] 16 (10/04 0518) BP: (111-233)/(63-103) 111/63 (10/04 0518) SpO2:  [93 %-100 %] 99 % (10/04 0518) Weight:  [  168 lb (76.2 kg)] 168 lb (76.2 kg) (10/04 0024) Physical Exam: General: well-appearing, no distress, alert, oriented x4 Cardiovascular: regular rate and rhythm, palpable pt/dpt  bilaterally Respiratory: lungs with coarse breath sounds in Bilateral bases, no chest pain, no increased work of breathing Abdomen: soft, non-distended, tender LLQ. No rebound. Bowel sounds present Back: Patient with left sided cva tenderness,  Extremities: 5/5 strength all muscle distributions BUE/BLE, no neuro deficits appreciated  Laboratory:  Recent Labs Lab 09/11/17 1111 09/12/17 0218  WBC 12.4* 10.9*  HGB 10.8* 9.4*  HCT 32.8* 28.6*  PLT 173 124*    Recent Labs Lab 09/11/17 1111 09/12/17 0218  NA 136 137  K 4.1 3.7  CL 108 110  CO2 19* 17*  BUN 42* 47*  CREATININE 3.64* 4.02*  CALCIUM 8.9 8.2*  PROT 7.2  --   BILITOT 0.6  --   ALKPHOS 83  --   ALT 15*  --   AST 26  --   GLUCOSE 120* 103*    Imaging/Diagnostic Tests: Study Result   CLINICAL DATA:  Intermittent shortness of breath, rhonchi. Hand edema. History of CHF, COPD.  EXAM: CHEST  2 VIEW  COMPARISON:  None.  FINDINGS: Cardiomediastinal silhouette is normal. Calcified aortic arch. No pleural effusions or focal consolidations. Mild bronchitic changes. Trachea projects midline and there is no pneumothorax. Soft tissue planes and included osseous structures are non-suspicious. Old LEFT mid clavicle fracture.  IMPRESSION: Mild bronchitic changes without focal consolidation.   Electronically Signed   By: Elon Alas M.D.   On: 09/11/2017 22:09     Guadalupe Dawn, MD 09/12/2017, 11:49 AM PGY-1, [place] Intern pager: [phone], text pages welcome

## 2017-09-13 DIAGNOSIS — R0602 Shortness of breath: Secondary | ICD-10-CM

## 2017-09-13 DIAGNOSIS — N419 Inflammatory disease of prostate, unspecified: Secondary | ICD-10-CM

## 2017-09-13 DIAGNOSIS — R0902 Hypoxemia: Secondary | ICD-10-CM

## 2017-09-13 LAB — BASIC METABOLIC PANEL
Anion gap: 7 (ref 5–15)
BUN: 64 mg/dL — AB (ref 6–20)
CHLORIDE: 107 mmol/L (ref 101–111)
CO2: 19 mmol/L — AB (ref 22–32)
CREATININE: 4.75 mg/dL — AB (ref 0.61–1.24)
Calcium: 8.1 mg/dL — ABNORMAL LOW (ref 8.9–10.3)
GFR calc Af Amer: 13 mL/min — ABNORMAL LOW (ref >60)
GFR calc non Af Amer: 11 mL/min — ABNORMAL LOW (ref >60)
GLUCOSE: 90 mg/dL (ref 65–99)
POTASSIUM: 4 mmol/L (ref 3.5–5.1)
SODIUM: 133 mmol/L — AB (ref 135–145)

## 2017-09-13 LAB — URINE CULTURE

## 2017-09-13 LAB — CBC
HEMATOCRIT: 27.5 % — AB (ref 39.0–52.0)
Hemoglobin: 9.1 g/dL — ABNORMAL LOW (ref 13.0–17.0)
MCH: 29.3 pg (ref 26.0–34.0)
MCHC: 33.1 g/dL (ref 30.0–36.0)
MCV: 88.4 fL (ref 78.0–100.0)
PLATELETS: 129 10*3/uL — AB (ref 150–400)
RBC: 3.11 MIL/uL — ABNORMAL LOW (ref 4.22–5.81)
RDW: 16 % — AB (ref 11.5–15.5)
WBC: 11 10*3/uL — ABNORMAL HIGH (ref 4.0–10.5)

## 2017-09-13 LAB — FERRITIN: Ferritin: 111 ng/mL (ref 24–336)

## 2017-09-13 LAB — IRON AND TIBC
IRON: 7 ug/dL — AB (ref 45–182)
SATURATION RATIOS: 3 % — AB (ref 17.9–39.5)
TIBC: 210 ug/dL — AB (ref 250–450)
UIBC: 203 ug/dL

## 2017-09-13 MED ORDER — HYDRALAZINE HCL 25 MG PO TABS
25.0000 mg | ORAL_TABLET | Freq: Two times a day (BID) | ORAL | Status: DC
  Administered 2017-09-13 – 2017-09-15 (×5): 25 mg via ORAL
  Filled 2017-09-13 (×5): qty 1

## 2017-09-13 NOTE — Progress Notes (Signed)
Family Medicine Teaching Service Daily Progress Note Intern Pager: [phone]  Patient name: Kerry Underwood Medical record number: [id number] Date of birth: 08-Dec-1951 Age: 66 y.o. Gender: male  Primary Care Provider: Patient, No Pcp Per Consultants: nephrology Code Status: full   Pt Overview and Major Events to Date:  Kerry Underwood is a 66 year old male with PMH of HTN, COPD, diastolic heart failure, bph, ckdIII, homelessness presenting with abdominal pain and dysuria. Patient started on ceftriaxone on 10/3. Nephrology evaluated and discussed dialysis. Patient is adamant about not getting dialysis. Pyelonephritis improving, afebrile, decreasing leukocytosis.   Assessment and Plan: # pyelonephritis Patient presented with 1 day history of abdominal pain and back pain with urination, foul smelling urine, dysuria. CT scan negative for stone but shows perinephric edema and stranding with slight enlargement of ureter. Started on ceftriaxone 10/3. He has had subjective improvement in pain, no fevers, and decreased white count. Will plan to continue ctx until culture data results and can de-escalate antibiotics. Has speciated to E coli but no sensitivities back. Can likely switch to po antibiotics after speciation back and can likely dc at that point. - regular diet as tolerated - vitals signs per unit routine - ceftriaxone 1g daily - tylenol prn for pain and fever - zofran prn for nausea - f/u urine culture for sensitivities - cbc, bmp in am  # AKI on CKD Baseline creatinine around 3.5 per OSH records. Creatinine at 3.6 on presentation, today cr 4.75 from 4.0. Patient without any electrolyte abnormalities to indicate urgent need for dialysis, but will need dialysis in near future given his worsening renal status. Nephrology following and have discussed at length his options and future need for dialysis. Patient has thus far been adamant about not getting dialysis. Will need to tread thin line with  diuresis if patient's diastolic heart failure worsens.  - Appreciate nephrology recs - Patient currently refusing dialysis - avoid nephrotoxic medications  - monitor cr with daily bmp  # Diastolic heart failure Patient with diastolic heart failure per echo at last admission in June at OSH. Patent with occasionally desat while laying flat down in ed. Has been satting well since admission.  - will continue to monitor fluid status - avoid giving fluids if possible - consider repeat echo  # COPD DDx for oxygen desaturation include possible COPD exacerbation thought patient has had no cough and had a transient episode of increased work of breathing. He is not on any controlled medications and is not requiring any oxygen supplementation at this time - duonebs q6prn - needs outpatient follow up for PFTs  # Hypertensive urgency Presented with pressure of 233/103. Received on dose of labetolol and one dose of hydralazine in ed. Will continue home meds, plan to give prns if hypertensive during admission. Pressure currently 111/63. - continue home coreg - continue home hydralazine - continue home imdur  # BPH Has had difficuly urinating for the past year with weak stream. Was on flomax during hospital stay in June but not continued as outpatient. - continue flomax - getting post void residual  # Poor social situation Homelessness and questionable h/o ETOH abuse. Denies ETOH used in years on admission but had reportedly told ED provider that drinks 6 pack per day. ETOH level <10. - Social work consulted - monitor under CIWA protocol  FEN/GI: regular diet PPx: heparin 5000U  Disposition: is a drifter that passes through, was staying with friends  Subjective:  Says that he is sore all over but  is otherwise doing well. Still not much appetite. Abdominal pain has improved from 10.4 Back improving but still present.  Objective: Temp:  [97.8 F (36.6 C)-98.7 F (37.1 C)] 98 F (36.7 C)  (10/05 0847) Pulse Rate:  [73-80] 80 (10/05 0847) Resp:  [16] 16 (10/05 0847) BP: (120-131)/(59-73) 127/60 (10/05 0847) SpO2:  [97 %-99 %] 97 % (10/05 0847) Weight:  [168 lb (76.2 kg)] 168 lb (76.2 kg) (10/04 2118) Physical Exam: General: well-appearing, no distress, alert, oriented x4 Cardiovascular: regular rate and rhythm, palpable pt/dpt bilaterally Respiratory: lungs with coarse breath sounds in Bilateral bases, no chest pain, no increased work of breathing Abdomen: soft, non-distended, tender LLQ. No rebound. Bowel sounds present Back: Decreasing left sided cva tenderness,  Extremities: 5/5 strength all muscle distributions BUE/BLE, no neuro deficits appreciated  Laboratory:  Recent Labs Lab 09/11/17 1111 09/12/17 0218 09/13/17 0354  WBC 12.4* 10.9* 11.0*  HGB 10.8* 9.4* 9.1*  HCT 32.8* 28.6* 27.5*  PLT 173 124* 129*    Recent Labs Lab 09/11/17 1111 09/12/17 0218 09/13/17 0354  NA 136 137 133*  K 4.1 3.7 4.0  CL 108 110 107  CO2 19* 17* 19*  BUN 42* 47* 64*  CREATININE 3.64* 4.02* 4.75*  CALCIUM 8.9 8.2* 8.1*  PROT 7.2  --   --   BILITOT 0.6  --   --   ALKPHOS 83  --   --   ALT 15*  --   --   AST 26  --   --   GLUCOSE 120* 103* 90    Imaging/Diagnostic Tests: Study Result   CLINICAL DATA:  Intermittent shortness of breath, rhonchi. Hand edema. History of CHF, COPD.  EXAM: CHEST  2 VIEW  COMPARISON:  None.  FINDINGS: Cardiomediastinal silhouette is normal. Calcified aortic arch. No pleural effusions or focal consolidations. Mild bronchitic changes. Trachea projects midline and there is no pneumothorax. Soft tissue planes and included osseous structures are non-suspicious. Old LEFT mid clavicle fracture.  IMPRESSION: Mild bronchitic changes without focal consolidation.   Electronically Signed   By: Elon Alas M.D.   On: 09/11/2017 22:09     Guadalupe Dawn, MD 09/13/2017, 9:47 AM PGY-1, [place]  Intern pager: [phone], text pages welcome

## 2017-09-13 NOTE — Progress Notes (Signed)
Family Medicine progress update  Patient with culture results showing ecoli and sensitives to cefazoling, cipro, bactrim. Given patient's poor renal function we will switch him to keflex. We will give patient one more dose of CTX giving him three total days. He will then switch to kelfex 10/6. He will likely be ok for discharge at that time, unless he changes his mind about dialysis. Will continue to follow but expect d/c home 10/6.  Guadalupe Dawn MD PGY-1 Family Medicine Resident

## 2017-09-13 NOTE — Progress Notes (Signed)
[name]  Subjective/Interval History:  Says "hurts all over" Has been living with friends recently ("they got me out of the horse trailer" Has a [place] named Posey Pronto - "my best friend for 11 years" - worried about her Says was planning to leave town this week - just moving along  Objective Vital signs in last 24 hours: Vitals:   09/12/17 1500 09/12/17 1800 09/12/17 2118 09/13/17 0416  BP: (!) 120/59 120/60 125/73 131/65  Pulse: 78 80 73 73  Resp: 16  16 16   Temp: 97.8 F (36.6 C)  98.7 F (37.1 C) 98.4 F (36.9 C)  TempSrc: Oral  Oral Oral  SpO2: 99%  98% 97%  Weight:   76.2 kg (168 lb)   Height:       Weight change: 0 kg (0 lb)  Intake/Output Summary (Last 24 hours) at 09/13/17 0821 Last data filed at 09/13/17 0600  Gross per 24 hour  Intake              590 ml  Output             1250 ml  Net             -660 ml   Physical Exam:  Blood pressure 131/65, pulse 73, temperature 98.4 F (36.9 C), temperature source Oral, resp. rate 16, height 5\' 5"  (1.651 m), weight 76.2 kg (168 lb), SpO2 97 %.  Mildly disheveled in appearance Very pleasant, soft spoken No JVD Lungs clear S4S1S2 Soft nontender abdomen No edema of LE's No asterixus   Recent Labs Lab 09/11/17 1111 09/12/17 0218 09/13/17 0354  NA 136 137 133*  K 4.1 3.7 4.0  CL 108 110 107  CO2 19* 17* 19*  GLUCOSE 120* 103* 90  BUN 42* 47* 64*  CREATININE 3.64* 4.02* 4.75*  CALCIUM 8.9 8.2* 8.1*    Recent Labs Lab 09/11/17 1111  AST 26  ALT 15*  ALKPHOS 83  BILITOT 0.6  PROT 7.2  ALBUMIN 3.5    Recent Labs Lab 09/11/17 1111  LIPASE 28     Recent Labs Lab 09/11/17 1111 09/12/17 0218 09/13/17 0354  WBC 12.4* 10.9* 11.0*  HGB 10.8* 9.4* 9.1*  HCT 32.8* 28.6* 27.5*  MCV 88.9 89.1 88.4  PLT 173 124* 129*    Iron/TIBC/Ferritin/ %Sat    Component Value Date/Time   IRON 7 (L) 09/13/2017 0354   TIBC 210 (L) 09/13/2017 0354   FERRITIN 111 09/13/2017 0354   IRONPCTSAT 3 (L)  09/13/2017 0354   Studies/Results: Dg Chest 2 View  Result Date: 09/11/2017 CLINICAL DATA:  Intermittent shortness of breath, rhonchi. Hand edema. History of CHF, COPD. EXAM: CHEST  2 VIEW COMPARISON:  None. FINDINGS: Cardiomediastinal silhouette is normal. Calcified aortic arch. No pleural effusions or focal consolidations. Mild bronchitic changes. Trachea projects midline and there is no pneumothorax. Soft tissue planes and included osseous structures are non-suspicious. Old LEFT mid clavicle fracture. IMPRESSION: Mild bronchitic changes without focal consolidation. Electronically Signed   By: Elon Alas M.D.   On: 09/11/2017 22:09   Ct Renal Stone Study  Result Date: 09/11/2017 CLINICAL DATA:  Left lower abdominal pain EXAM: CT ABDOMEN AND PELVIS WITHOUT CONTRAST TECHNIQUE: Multidetector CT imaging of the abdomen and pelvis was performed following the standard protocol without IV contrast. COMPARISON:  None. FINDINGS: Lower chest: Lung bases demonstrate no acute consolidation or effusion. Normal heart size. Small hiatal hernia. Hepatobiliary: Lobulated liver contour. No focal hepatic abnormality, calcified gallstone or biliary dilatation  Pancreas: Unremarkable. No pancreatic ductal dilatation or surrounding inflammatory changes. Spleen: Normal in size without focal abnormality. Adrenals/Urinary Tract: Adrenal glands are within normal limits. Markedly atrophic right kidney without hydronephrosis. Punctate stone in the upper pole of the right kidney. Moderate-to-marked left perinephric edema and fat stranding. 2 cm low-density lesion mid left kidney. No significant left hydronephrosis. Slightly enlarged mid to distal left ureter. No stones along the course of the left ureter. Slightly thick-walled appearance of the bladder Stomach/Bowel: Stomach is within normal limits. Appendix appears normal. No evidence of bowel wall thickening, distention, or inflammatory changes. Vascular/Lymphatic: Aortic  atherosclerosis. Mild retroperitoneal adenopathy with left periaortic lymph node measuring up to 9 mm. Reproductive: Slightly enlarged prostate Other: Negative for free air or free fluid. Within the bilateral inguinal canals Musculoskeletal: No acute or suspicious bone lesion IMPRESSION: 1. Moderate-to-marked left perinephric edema and fat stranding. No significant left hydronephrosis, slight enlargement of the mid to distal left ureter however no definitive ureteral stone is visualized. Differential considerations include recently passed stone ; could also consider pyelonephritis. 2. Thick-walled appearance of the bladder suggesting cystitis or chronic obstruction 3. Very atrophic right kidney without hydronephrosis. Punctate stone in the upper pole of the right kidney 4. 2 cm hypodensity mid pole left kidney, possible cyst but further evaluation limited without contrast 5. Lobulated liver contour, query cirrhosis or hepatocellular disease Electronically Signed   By: Donavan Foil M.D.   On: 09/11/2017 16:55   Medications: . cefTRIAXone (ROCEPHIN)  IV Stopped (09/12/17 1915)   . carvedilol  12.5 mg Oral BID WC  . folic acid  1 mg Oral Daily  . heparin  5,000 Units Subcutaneous Q8H  . hydrALAZINE  50 mg Oral BID  . Influenza vac split quadrivalent PF  0.5 mL Intramuscular Tomorrow-1000  . isosorbide mononitrate  30 mg Oral q morning - 10a  . multivitamin with minerals  1 tablet Oral Daily  . pneumococcal 23 valent vaccine  0.5 mL Intramuscular Tomorrow-1000  . tamsulosin  0.4 mg Oral Daily  . thiamine  100 mg Oral Daily   Or  . thiamine  100 mg Intravenous Daily    Background: 25M with a PMH significant for HTN, dCHF, COPD and a h/o BPH with urinary retention who is now seen in consultation at the request of Dr. 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 Past CKD hisotry from admission to pt [facility] 05/2017 w/SOB and hypertensive emergency,  diastolic CHF exacerbation.  Creatinine at that hospitalization ranged from 3.2-3.9 (discharged with a Cr of 3.56).  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  (workup then BPH with mild hydro/bladder distention on renal US.  Kidneys echogenic). Had Foley catheter placed/removed prior to discharge. Admitted on current occasion with L renal pyelo (and notably R atrophic kidney). Dilated L ureter. Thick walled bladder. Creatinine 3.6-4. We were asked to see.   Assessment/Plan  1. AKI on CKD IV: Underlying CKD 2/2 poorly controlled HTN +/- renal artery stenosis given atrophic R kidney.  Doesn't seem to have a great contribution from obstruction.  AKI likely hemodynamically mediated in the setting of n/v and pyelonephritis. Creatinine continues to rise. Hemodynamically drive with normalization of severely elevated BP.   1. Dr. Hollie Salk had d/w pt re HD, explained that he would require dialysis in his lifetime and it would be sooner (  potentially even < 1 year) than later.   2. Pt remains adamant in a very nice way that he will not ever undergo dialysis treatments--> says he isn't likely to change his mind 3. Was discussed what to expect as people become symptomatic with kidney failure, the natural course of kidney failure.   4. He would not commit to any type of outpt renal followup with either myself or Dr. Hollie Salk "I'm a drifter - I don't know where I will be" but will continue these conversations with him  2    Pyelonephritis: on ceftriaxone (10/3-).  UA dirty, urine culture showing > 100,000 cfus GNR, speciation still pending. Leukocytosis improving.  3.   Diastolic CHF: appears to be relatively euvolemic at this time.  Not on Lasix.  EF 67-89% with diastolic dysfunction 02/8100.  4.   Hypertension: on hydralazine 50 mg BID, coreg 12.5 mg BID, imdur 30 mg daily, and prn labetalol.  BP are  improving.    No ACEi/ARB due to severely reduced GFR.   "Normalization" of BP hemodynamically driving AKI on CKD (751'W->258'N).   Hopefully creatinine will plateau soon.   Would be OK with me to reduce BP meds some, let BP drift back up a bit (I have actually reduced his hydralazine to 25)   5.   BPH - with prior hydro (last adm).  6.   Anemia - needs Fe. Best IV. Still on ATB's for pyelo.   Give Feraheme once off IV ATBs   [name], MD [facility] [phone] pager 09/13/2017, 8:21 AM

## 2017-09-14 LAB — CBC WITH DIFFERENTIAL/PLATELET
BASOS PCT: 0 %
Basophils Absolute: 0 10*3/uL (ref 0.0–0.1)
Eosinophils Absolute: 0.1 10*3/uL (ref 0.0–0.7)
Eosinophils Relative: 1 %
HEMATOCRIT: 28 % — AB (ref 39.0–52.0)
HEMOGLOBIN: 9.1 g/dL — AB (ref 13.0–17.0)
LYMPHS PCT: 10 %
Lymphs Abs: 0.8 10*3/uL (ref 0.7–4.0)
MCH: 29 pg (ref 26.0–34.0)
MCHC: 32.5 g/dL (ref 30.0–36.0)
MCV: 89.2 fL (ref 78.0–100.0)
MONOS PCT: 8 %
Monocytes Absolute: 0.7 10*3/uL (ref 0.1–1.0)
NEUTROS ABS: 7.1 10*3/uL (ref 1.7–7.7)
NEUTROS PCT: 81 %
Platelets: 144 10*3/uL — ABNORMAL LOW (ref 150–400)
RBC: 3.14 MIL/uL — ABNORMAL LOW (ref 4.22–5.81)
RDW: 15.8 % — ABNORMAL HIGH (ref 11.5–15.5)
WBC: 8.8 10*3/uL (ref 4.0–10.5)

## 2017-09-14 LAB — BASIC METABOLIC PANEL
ANION GAP: 11 (ref 5–15)
BUN: 75 mg/dL — AB (ref 6–20)
CHLORIDE: 105 mmol/L (ref 101–111)
CO2: 18 mmol/L — ABNORMAL LOW (ref 22–32)
Calcium: 8.3 mg/dL — ABNORMAL LOW (ref 8.9–10.3)
Creatinine, Ser: 4.9 mg/dL — ABNORMAL HIGH (ref 0.61–1.24)
GFR, EST AFRICAN AMERICAN: 13 mL/min — AB (ref >60)
GFR, EST NON AFRICAN AMERICAN: 11 mL/min — AB (ref >60)
Glucose, Bld: 94 mg/dL (ref 65–99)
POTASSIUM: 4.5 mmol/L (ref 3.5–5.1)
Sodium: 134 mmol/L — ABNORMAL LOW (ref 135–145)

## 2017-09-14 LAB — VITAMIN D 25 HYDROXY (VIT D DEFICIENCY, FRACTURES): Vit D, 25-Hydroxy: 22.7 ng/mL — ABNORMAL LOW (ref 30.0–100.0)

## 2017-09-14 LAB — PARATHYROID HORMONE, INTACT (NO CA): PTH: 71 pg/mL — AB (ref 15–65)

## 2017-09-14 MED ORDER — SODIUM CHLORIDE 0.9 % IV SOLN
510.0000 mg | Freq: Once | INTRAVENOUS | Status: AC
  Administered 2017-09-14: 510 mg via INTRAVENOUS
  Filled 2017-09-14: qty 17

## 2017-09-14 MED ORDER — CEPHALEXIN 250 MG PO CAPS
250.0000 mg | ORAL_CAPSULE | Freq: Two times a day (BID) | ORAL | Status: DC
  Administered 2017-09-14: 250 mg via ORAL
  Filled 2017-09-14 (×2): qty 1

## 2017-09-14 MED ORDER — CIPROFLOXACIN HCL 500 MG PO TABS
500.0000 mg | ORAL_TABLET | ORAL | Status: DC
  Administered 2017-09-14 – 2017-09-17 (×4): 500 mg via ORAL
  Filled 2017-09-14 (×4): qty 1

## 2017-09-14 MED ORDER — CEPHALEXIN 500 MG PO CAPS: 500.0000 mg | ORAL_CAPSULE | Freq: Four times a day (QID) | ORAL | Status: DC

## 2017-09-14 NOTE — Evaluation (Signed)
Physical Therapy Evaluation Patient Details Name: Kerry Underwood MRN: [id number] DOB: 1951/04/21 Today's Date: 09/14/2017   History of Present Illness  66 year old male who presented on 10/3 with 1 day history of n/v, abdominal pain, back pain, foul smelling urine. CT scan showed atropic right kidney and perinephric stranding around left kidney consistent with pyelonephritis.  Creatinine continued to increase and patient refused dialysis.    Clinical Impression  Patient presents with mild dependencies in gait and transfers and balance.  Patient will benefit from continued PT while in the hospital to progress mobility to maximum potential.  Patient is not interested in f/u PT or cane for balance.      Follow Up Recommendations No PT follow up (pt has no means for f/u PT)    Equipment Recommendations  None recommended by PT    Recommendations for Other Services       Precautions / Restrictions Precautions Precautions: Fall      Mobility  Bed Mobility               General bed mobility comments: in chair upon arrival  Transfers Overall transfer level: Modified independent Equipment used: None                Ambulation/Gait Ambulation/Gait assistance: Min guard Ambulation Distance (Feet): 100 Feet Assistive device: 1 person hand held assist Gait Pattern/deviations: Step-through pattern;Decreased stride length;Narrow base of support;Decreased weight shift to right;Decreased weight shift to left Gait velocity: decreased Gait velocity interpretation: Below normal speed for age/gender    Stairs            Wheelchair Mobility    Modified Rankin (Stroke Patients Only)       Balance Overall balance assessment: Needs assistance Sitting-balance support: No upper extremity supported;Feet supported Sitting balance-Leahy Scale: Good     Standing balance support: No upper extremity supported Standing balance-Leahy Scale: Fair                               Pertinent Vitals/Pain Pain Assessment: No/denies pain    Home Living Family/patient expects to be discharged to:: Shelter/Homeless                 Additional Comments: plans to go [place] the wood upon discharge    Prior Function Level of Independence: Independent               Hand Dominance        Extremity/Trunk Assessment   Upper Extremity Assessment Upper Extremity Assessment: Generalized weakness    Lower Extremity Assessment Lower Extremity Assessment: Generalized weakness       Communication   Communication: No difficulties  Cognition Arousal/Alertness: Awake/alert Behavior During Therapy: WFL for tasks assessed/performed Overall Cognitive Status: Within Functional Limits for tasks assessed                                        General Comments      Exercises General Exercises - Lower Extremity Ankle Circles/Pumps: AROM;Both;10 reps;Seated   Assessment/Plan    PT Assessment Patient needs continued PT services  PT Problem List Decreased strength;Decreased range of motion;Decreased activity tolerance;Decreased balance;Decreased mobility       PT Treatment Interventions Gait training;Functional mobility training;Therapeutic activities;Therapeutic exercise;Balance training    PT Goals (Current goals can be found in the Care Plan section)  Acute Rehab PT Goals Patient Stated Goal: get back to the woods PT Goal Formulation: With patient Time For Goal Achievement: 09/21/17 Potential to Achieve Goals: Good    Frequency Min 3X/week   Barriers to discharge   homelessness    Co-evaluation               AM-PAC PT "6 Clicks" Daily Activity  Outcome Measure Difficulty turning over in bed (including adjusting bedclothes, sheets and blankets)?: A Little Difficulty moving from lying on back to sitting on the side of the bed? : A Little Difficulty sitting down on and standing up from a chair with arms (e.g.,  wheelchair, bedside commode, etc,.)?: A Little Help needed moving to and from a bed to chair (including a wheelchair)?: A Little Help needed walking in hospital room?: A Little Help needed climbing 3-5 steps with a railing? : A Little 6 Click Score: 18    End of Session   Activity Tolerance: Patient tolerated treatment well Patient left: in chair;with chair alarm set;with call bell/phone within reach   PT Visit Diagnosis: Unsteadiness on feet (R26.81);Muscle weakness (generalized) (M62.81)    Time: 0160-1093 PT Time Calculation (min) (ACUTE ONLY): 12 min   Charges:   PT Evaluation $PT Eval Low Complexity: 1 Low     PT G Codes:        09/24/17 [facility], PT [phone]    Shanna Cisco 2017-09-24, 3:57 PM

## 2017-09-14 NOTE — Progress Notes (Signed)
Notified MD of pt compliant of dry non reproductive and weakness with ambulation.    Will continue to monitor  Paulla Fore, RN

## 2017-09-14 NOTE — Plan of Care (Signed)
Problem: Tissue Perfusion: Goal: Risk factors for ineffective tissue perfusion will decrease Outcome: Not Progressing Patient refused heparin and VTE prophylaxis. Patient educated on risks of refusing VTE prophylaxis.

## 2017-09-14 NOTE — Progress Notes (Signed)
[name]  Subjective/Interval History:   Feels bad Dizzy, lightheaded Talked about his kidney failure again and he at least has given it some thought - then says "just don't know how I would do ;t" Describes self as a drifter, has lived variously in the woods, in a horse trailer, with friends (says will probably "go to the woods" when he gets out of the hospital)  Objective Vital signs in last 24 hours: Vitals:   09/13/17 1805 09/13/17 2112 09/14/17 0412 09/14/17 0919  BP: (!) 122/49 (!) 168/81 (!) 149/67 (!) 161/76  Pulse: 77 78 64 60  Resp: 16 17 19 20   Temp: 98.7 F (37.1 C) 99.9 F (37.7 C) 98.2 F (36.8 C) 97.8 F (36.6 C)  TempSrc: Oral Oral Oral Oral  SpO2: 97% 97% 98% 98%  Weight:  78.3 kg (172 lb 9.6 oz)    Height:       Weight change: 2.087 kg (4 lb 9.6 oz)  Intake/Output Summary (Last 24 hours) at 09/14/17 1324 Last data filed at 09/14/17 1200  Gross per 24 hour  Intake              770 ml  Output             2251 ml  Net            -1481 ml   Physical Exam:  Blood pressure (!) 161/76, pulse 60, temperature 97.8 F (36.6 C), temperature source Oral, resp. rate 20, height 5\' 5"  (1.651 m), weight 78.3 kg (172 lb 9.6 oz), SpO2 98 %.  Mildly disheveled in appearance Very pleasant, soft spoken No JVD Lungs clear S4S1S2 Soft nontender abdomen No edema of LE's No asterixus   Recent Labs Lab 09/11/17 1111 09/12/17 0218 09/13/17 0354 09/14/17 0317  NA 136 137 133* 134*  K 4.1 3.7 4.0 4.5  CL 108 110 107 105  CO2 19* 17* 19* 18*  GLUCOSE 120* 103* 90 94  BUN 42* 47* 64* 75*  CREATININE 3.64* 4.02* 4.75* 4.90*  CALCIUM 8.9 8.2* 8.1* 8.3*    Recent Labs Lab 09/11/17 1111  AST 26  ALT 15*  ALKPHOS 83  BILITOT 0.6  PROT 7.2  ALBUMIN 3.5    Recent Labs Lab 09/11/17 1111  LIPASE 28     Recent Labs Lab 09/11/17 1111 09/12/17 0218 09/13/17 0354 09/14/17 0317  WBC 12.4* 10.9* 11.0* 8.8  NEUTROABS  --   --   --  7.1  HGB 10.8*  9.4* 9.1* 9.1*  HCT 32.8* 28.6* 27.5* 28.0*  MCV 88.9 89.1 88.4 89.2  PLT 173 124* 129* 144*    Iron/TIBC/Ferritin/ %Sat    Component Value Date/Time   IRON 7 (L) 09/13/2017 0354   TIBC 210 (L) 09/13/2017 0354   FERRITIN 111 09/13/2017 0354   IRONPCTSAT 3 (L) 09/13/2017 0354   Medications:  . carvedilol  12.5 mg Oral BID WC  . cephALEXin  250 mg Oral Q12H  . folic acid  1 mg Oral Daily  . heparin  5,000 Units Subcutaneous Q8H  . hydrALAZINE  25 mg Oral BID  . Influenza vac split quadrivalent PF  0.5 mL Intramuscular Tomorrow-1000  . isosorbide mononitrate  30 mg Oral q morning - 10a  . multivitamin with minerals  1 tablet Oral Daily  . pneumococcal 23 valent vaccine  0.5 mL Intramuscular Tomorrow-1000  . tamsulosin  0.4 mg Oral Daily  . thiamine  100 mg Oral Daily   Or  .  thiamine  100 mg Intravenous Daily    Background: 33M with a PMH significant for HTN, dCHF, COPD and a h/o BPH with urinary retention who is now seen in consultation at the request of Dr. 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 Past CKD hisotry from admission to pt [facility] 05/2017 w/SOB and hypertensive emergency, diastolic CHF exacerbation.  Creatinine at that hospitalization ranged from 3.2-3.9 (discharged with a Cr of 3.56).  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  (workup then BPH with mild hydro/bladder distention on renal US.  Kidneys echogenic). Had Foley catheter placed/removed prior to discharge. Admitted on current occasion with L renal pyelo (and notably R atrophic kidney). Dilated L ureter. Thick walled bladder. Creatinine 3.6-4. We were asked to see.   Assessment/Plan  1. AKI on CKD IV: Underlying CKD 2/2 poorly controlled HTN +/- renal artery stenosis given atrophic R kidney.  Doesn't seem to have a great contribution from  obstruction.  AKI hemodynamically driven in the setting of n/v and pyelonephritis and BP control. Creatinine continues to rise but may be reaching plateau.   1. Dr. Hollie Salk had d/w pt re HD, explained that he would require dialysis in his lifetime and it would be sooner (potentially even < 1 year) than later.   2. Pt is less adamant today about not ever undergo dialysis treatments--> maybe giving it a second thought... 3. Has been discussed what to expect as people become symptomatic with kidney failure, the natural course of kidney failure.   4. He still won't commit to any type of outpt renal followup with either myself or Dr. Hollie Salk "I'm a drifter - I don't know where I will be" but will continue these conversations with him 2    Pyelonephritis: Ceftriaxone-->now keflex. 3.   Diastolic CHF: appears to be relatively euvolemic at this time.  Not on Lasix.  EF 79-02% with diastolic dysfunction 03/972. 4.   Hypertension: well controlled    No ACEi/ARB due to severely reduced GFR.   "Normalization" of BP hemodynamically driving AKI on CKD (532'D->924'Q).   Hopefully creatinine will plateau soon - rate of rise may be slowing down 5.   BPH - with prior hydro (last adm).  6.   Anemia - needs Fe. Best IV. Feraheme ordered. 7.   Disposition - homeless, has variously lived in the woods, horse trailers, w/friends. Says "will probably go to the woods when I leave". "I'd die before I'd go to a shelter".   Kerry Maes, MD [facility] [phone] pager 09/14/2017, 1:24 PM

## 2017-09-14 NOTE — Discharge Summary (Signed)
[facility] Discharge Summary  Patient name: Kerry Underwood Medical record number: [id number] Date of birth: 1951-10-07 Age: 66 y.o. Gender: male Date of Admission: 09/11/2017  Date of Discharge: 09/17/17  Admitting Physician: Kinnie Feil, MD  Primary Care Provider: patient goes to [facility] in [place]  Consultants: nephrology  Indication for Hospitalization: Pyelonephritis Atrophic Right kidney Chronic Kidney disease Acute Kidney injury Hypertensive urgency  Discharge Diagnoses/Problem List:  Pyelonephritis Atrophic Right kidney Chronic Kidney disease Acute Kidney injury Copd Diastolic heart failure BPH  Disposition: home  Discharge Condition: stable  Discharge Exam:  General: awake and alert, sitting in bed eating breakfast, no acute distress Cardiovascular: RRR, no m/r/g, palpable radial pulse bilaterally  Respiratory: CTAB, no wheezes, rales, or rhonchi  Abdomen: soft, non tender, non distended, bowel sounds x 4 quadrants, no CVA tenderness Extremities: no edema, non tender   Patient was examined by Dr. Kris Mouton the morning of discharge.  Brief Hospital Course:   66 year old male with history of hypertension, COPD, diastolic CHF, BPH, CKD-4, homelessness who presented with abdominal pain, dysuria and elevated blood pressure, and found to have pyelonephritis and hypertensive urgency.   #Pyelonephritis: Improved. Patient with leukocytosis to 10.8 on admission. UA with nitrites and large amount of bacteria. Had CVA tenderness on exam. CT scan showed atropic right kidney and perinephric stranding around left kidney consistent with pyelonephritis. Patient was started on ceftriaxone on 09/11/2017 and transitioned to ciprofloxacin 500 mg daily on 09/14/2017. Patient's symptoms improved. He remained afebrile throughout his hospitalizations. He was discharged on ciprofloxacin to complete the course through 09/25/2017.  #Hypertensive urgency:  Resolved. patient's systolic blood pressure up into  220s. Patient was on carvedilol, hydralazine and  Imdur prior to admission. Patient has no history of CHF nor CAD. His medications were changed to amlodipine 10 mg daily and metoprolol 25 mg daily. Coreg was discontinued and replaced by the metoprolol for more beta-1 selectivity.  #Progressive CKD-5: Patient with serum creatinine to 4.02 on admission. Serum creatinine trended up gradually over the course of his hospitalization to 5.15. Nephrology was consulted and discussed dialysis with the patient. Patient refused dialysis despite worsening renal function, "stating that he would never be interested in it."  Per nephrology," patient does not really want to have access placed after much discussion. He may not be a candidate for dialysis as his social situation is always going to interfere with his ability to comply with dialysis treatment.". Patient voices understanding the risk of his condition including death. Off note, patient had atrophic right kidney as seen on CT abdomen.   #Poor social situation: Homeless, has very slightly relieved in the woods, horse trailers, with friends. He says he would probably go to the ward when he leaves the hospital. He says he has a [place] friend that he has been taking care. Social work was consulted and evaluated the patient. He was provided with shelter resources. Per social work notes, patient is planning to stay with friends. He was also offered a bus pass but reports that he can have someone to pick him up. Patient was also evaluated by case management. He was given a match benefit for his prescriptions.   Other chronic medical problems are stable.  Issues for Follow Up:  1. Pyelonephritis: Discharge on ciprofloxacin 500 mg daily to take through 09/25/2017 2. CKD-5: re-evaluate need for dialysis if he changes his mind 3. Hypertension: Assess blood pressure and follow-up. Adjust medication as  needed.   Significant Procedures:  none  Significant Labs and Imaging:   Recent Labs Lab 09/12/17 0218 09/13/17 0354 09/14/17 0317  WBC 10.9* 11.0* 8.8  HGB 9.4* 9.1* 9.1*  HCT 28.6* 27.5* 28.0*  PLT 124* 129* 144*    Recent Labs Lab 09/11/17 1111  09/13/17 0354 09/14/17 0317 09/15/17 0251 09/16/17 0348 09/17/17 0344  NA 136  < > 133* 134* 135 140 140  K 4.1  < > 4.0 4.5 4.0 4.3 5.0  CL 108  < > 107 105 108 109 109  CO2 19*  < > 19* 18* 18* 20* 21*  GLUCOSE 120*  < > 90 94 109* 100* 101*  BUN 42*  < > 64* 75* 76* 72* 72*  CREATININE 3.64*  < > 4.75* 4.90* 4.67* 4.81* 5.15*  CALCIUM 8.9  < > 8.1* 8.3* 8.3* 8.5* 8.6*  PHOS  --   --   --   --  4.6 4.7*  --   ALKPHOS 83  --   --   --   --   --   --   AST 26  --   --   --   --   --   --   ALT 15*  --   --   --   --   --   --   ALBUMIN 3.5  --   --   --  2.4* 2.5*  --   < > = values in this interval not displayed.  Results/Tests Pending at Time of Discharge: none  Discharge Medications:  Allergies as of 09/17/2017   No Known Allergies     Medication List    STOP taking these medications   carvedilol 12.5 MG tablet Commonly known as:  COREG   hydrALAZINE 25 MG tablet Commonly known as:  APRESOLINE   isosorbide mononitrate 30 MG 24 hr tablet Commonly known as:  IMDUR     TAKE these medications   albuterol 108 (90 Base) MCG/ACT inhaler Commonly known as:  PROVENTIL HFA;VENTOLIN HFA Inhale 2 puffs into the lungs every 6 (six) hours as needed for wheezing or shortness of breath.   amLODipine 10 MG tablet Commonly known as:  NORVASC Take 1 tablet (10 mg total) by mouth daily.   aspirin EC 81 MG tablet Take 81 mg by mouth once as needed ("for sudden onset of chest pain"). What changed:  Another medication with the same name was removed. Continue taking this medication, and follow the directions you see here.   ciprofloxacin 500 MG tablet Commonly known as:  CIPRO Take 1 tablet (500 mg total) by mouth  daily.   metoprolol tartrate 25 MG tablet Commonly known as:  LOPRESSOR Take 1 tablet (25 mg total) by mouth 2 (two) times daily.   multivitamin with minerals Tabs tablet Take 1 tablet by mouth daily.   ondansetron 4 MG tablet Commonly known as:  ZOFRAN Take 1 tablet (4 mg total) by mouth every 6 (six) hours as needed for nausea or vomiting.   tamsulosin 0.4 MG Caps capsule Commonly known as:  FLOMAX Take 1 capsule (0.4 mg total) by mouth daily.   tiotropium 18 MCG inhalation capsule Commonly known as:  SPIRIVA HANDIHALER Place 1 capsule (18 mcg total) into inhaler and inhale daily.       Discharge Instructions: Please refer to Patient Instructions section of EMR for full details.  Patient was counseled important signs and symptoms that should prompt return to medical care, changes in medications, dietary instructions, activity restrictions, and  follow up appointments.   Follow-Up Appointments: Follow-up Information    Schedule an appointment as soon as possible for a visit with Pa, [facility].   Contact information: [place] [address] [phone]        [facility] Schedule an appointment as soon as possible for a visit in 1 day(s).   Why:  for hospital follow up Contact information: [address] [place], [place]  Phone: [phone]          Mercy Riding, MD 09/17/2017, 5:44 PM PGY-3, [place]

## 2017-09-14 NOTE — Plan of Care (Signed)
Problem: Activity: Goal: Risk for activity intolerance will decrease Outcome: Progressing Patient up to chair today and walked with PT down hall. Patient stated "I feel like I'm getting stronger"

## 2017-09-14 NOTE — Progress Notes (Signed)
Pt wanted to walk to bathroom this am for a bowel movement. Pt is very unsteady on his feet and almost fell in the bathroom. He states he is very dizzy and weak.   Eleanora Neighbor, RN

## 2017-09-14 NOTE — Progress Notes (Signed)
PHARMACY NOTE:  ANTIMICROBIAL RENAL DOSAGE ADJUSTMENT  Current antimicrobial regimen includes a mismatch between antimicrobial dosage and estimated renal function.  As per policy approved by the Pharmacy & Therapeutics and Medical Executive Committees, the antimicrobial dosage will be adjusted accordingly.  Current antimicrobial dosage:  Cephalexin 500mg  Q6H  Indication: UTI  Renal Function: Estimated Creatinine Clearance: 13.9 mL/min (A) (by C-G formula based on SCr of 4.9 mg/dL (H)). Pt does not want to undergo HD    Antimicrobial dosage has been changed to:  Cephalexin 250mg  Q12H   Thank you for allowing pharmacy to be a part of this patient's care.  Haze Boyden, [facility] 09/14/2017 8:44 AM

## 2017-09-14 NOTE — Clinical Social Work Note (Signed)
CSW met with pt @ bedside and provided shelter resources. Pt reports that he has been staying with friends and the plan is for him to go back to a friends. CSW offered bus pass, pt reports that he can have someone pick him up.  No other DC needs identified.  Indigo Chaddock B. Joline Maxcy Clinical Social Work Dept Weekend Social Worker [phone] 11:01 AM

## 2017-09-14 NOTE — Progress Notes (Signed)
Family Medicine Teaching Service Daily Progress Note Intern Pager: [phone]  Patient name: Kerry Underwood Medical record number: [id number] Date of birth: Jul 25, 1951 Age: 66 y.o. Gender: male  Primary Care Provider: Patient, No Pcp Per Consultants: nephrology Code Status: full  Assessment and Plan: Kerry Underwood is a 66 year old male with PMH of HTN, COPD, diastolic heart failure, bph, ckdIII, homelessness presenting with abdominal pain and dysuria and found to have pyelonephritis.  # Pyelonephritis, resolving Patient presented with 1 day history of abdominal pain and back pain with urination, foul smelling urine and dysuria. CT showed perinephric edema and stranding with slight enlargement of ureter consistent with pyelonephritis. Urine culture was positive for >100,000 colonies  Escherichia coli. Sensitivities shows almost and pansensitivity. Patient with normal vitals, and improved CVA tenderness. Given significant clinical improvement will transition patient from IV to oral in anticipation for discharge. -Start patient on ciprofloxacin 500 mg twice a day -Discontinue ceftriaxone 1g daily -Continue tylenol prn for pain and fever -Continue zofran prn for nausea - Follow-up on cbc, bmp in am  # Acute on CKD, stable Baseline creatinine around 3.5 per OSH records. This morning creatinine is 4.9 slightly from 4.7 yesterday. Nephrology has been following on a daily basis and has been discussing dialysis with patient. Patient is still reluctant but seems to be thinking about it option for him going forward.   - Appreciate nephrology recs - avoid nephrotoxic medications  - monitor cr with daily bmp  # Diastolic heart failure Patient with diastolic heart failure per echo at last admission in June at OSH. Patent with occasionally desat while laying flat down in ed. Has been satting well since admission.  - will continue to monitor fluid status - avoid giving fluids if possible - consider repeat  echo  # COPD DDx for oxygen desaturation include possible COPD exacerbation thought patient has had no cough and had a transient episode of increased work of breathing. He is not on any controlled medications and is not requiring any oxygen supplementation at this time - duonebs q6prn - needs outpatient follow up for PFTs  # Hypertensive urgency On admission BP was 233/103. Blood pressure has been much better controlled since admission though still up above goal. This morning BP 161/76. Likely renal stenosis contributing to elevated blood pressure. - continue home coreg - continue home hydralazine - continue home imdur  # BPH Patient is currently on Flomax with some signs of retention. Currently wearing a condom cath. Will order postvoid residual to assess for retention. If needed for Place Foley. - continue flomax - getting post void residual  # Poor social situation Homelessness and questionable h/o ETOH abuse. Denies ETOH used in years on admission but had reportedly told ED provider that drinks 6 pack per day. ETOH level <10. - Social work consulted - monitor under CIWA protocol  FEN/GI: regular diet PPx: heparin 5000U  Disposition: Homeless, likely discharge to SNF after PT eval  Subjective:  Patient reports no back pain this morning. Patient reports some weakness. Good appetite. Patient has been thinking about dialysis as an option.  Objective: Temp:  [97.8 F (36.6 C)-98.2 F (36.8 C)] 97.8 F (36.6 C) (10/06 0919) Pulse Rate:  [54-64] 55 (10/06 1852) Resp:  [19-20] 20 (10/06 0919) BP: (149-161)/(67-76) 154/69 (10/06 1614) SpO2:  [98 %] 98 % (10/06 0919)   Physical Exam: General: well-appearing, no distress, alert, oriented x4 Cardiovascular: regular rate and rhythm, palpable pt/dpt bilaterally Respiratory: lungs with coarse breath sounds in Bilateral  bases, no chest pain, no increased work of breathing Abdomen: soft, non-distended, tender LLQ. No rebound. Bowel  sounds present Back: Decreasing left sided cva tenderness,  Extremities: 5/5 strength all muscle distributions BUE/BLE, no neuro deficits appreciated  Laboratory:  Recent Labs Lab 09/12/17 0218 09/13/17 0354 09/14/17 0317  WBC 10.9* 11.0* 8.8  HGB 9.4* 9.1* 9.1*  HCT 28.6* 27.5* 28.0*  PLT 124* 129* 144*    Recent Labs Lab 09/11/17 1111 09/12/17 0218 09/13/17 0354 09/14/17 0317  NA 136 137 133* 134*  K 4.1 3.7 4.0 4.5  CL 108 110 107 105  CO2 19* 17* 19* 18*  BUN 42* 47* 64* 75*  CREATININE 3.64* 4.02* 4.75* 4.90*  CALCIUM 8.9 8.2* 8.1* 8.3*  PROT 7.2  --   --   --   BILITOT 0.6  --   --   --   ALKPHOS 83  --   --   --   ALT 15*  --   --   --   AST 26  --   --   --   GLUCOSE 120* 103* 90 94    Imaging/Diagnostic Tests: Study Result   CLINICAL DATA:  Intermittent shortness of breath, rhonchi. Hand edema. History of CHF, COPD.  EXAM: CHEST  2 VIEW  COMPARISON:  None.  FINDINGS: Cardiomediastinal silhouette is normal. Calcified aortic arch. No pleural effusions or focal consolidations. Mild bronchitic changes. Trachea projects midline and there is no pneumothorax. Soft tissue planes and included osseous structures are non-suspicious. Old LEFT mid clavicle fracture.  IMPRESSION: Mild bronchitic changes without focal consolidation.   Electronically Signed   By: Elon Alas M.D.   On: 09/11/2017 22:09     Marjie Skiff, MD 09/14/2017, 10:05 PM PGY-1, [place] Intern pager: [phone], text pages welcome

## 2017-09-15 LAB — RENAL FUNCTION PANEL
Albumin: 2.4 g/dL — ABNORMAL LOW (ref 3.5–5.0)
Anion gap: 9 (ref 5–15)
BUN: 76 mg/dL — ABNORMAL HIGH (ref 6–20)
CALCIUM: 8.3 mg/dL — AB (ref 8.9–10.3)
CO2: 18 mmol/L — ABNORMAL LOW (ref 22–32)
CREATININE: 4.67 mg/dL — AB (ref 0.61–1.24)
Chloride: 108 mmol/L (ref 101–111)
GFR calc Af Amer: 14 mL/min — ABNORMAL LOW (ref >60)
GFR calc non Af Amer: 12 mL/min — ABNORMAL LOW (ref >60)
GLUCOSE: 109 mg/dL — AB (ref 65–99)
Phosphorus: 4.6 mg/dL (ref 2.5–4.6)
Potassium: 4 mmol/L (ref 3.5–5.1)
Sodium: 135 mmol/L (ref 135–145)

## 2017-09-15 LAB — GLUCOSE, CAPILLARY: Glucose-Capillary: 107 mg/dL — ABNORMAL HIGH (ref 65–99)

## 2017-09-15 MED ORDER — CARVEDILOL 3.125 MG PO TABS
3.1250 mg | ORAL_TABLET | Freq: Two times a day (BID) | ORAL | Status: DC
  Administered 2017-09-16: 3.125 mg via ORAL
  Filled 2017-09-15: qty 1

## 2017-09-15 MED ORDER — HYDRALAZINE HCL 25 MG PO TABS
25.0000 mg | ORAL_TABLET | Freq: Once | ORAL | Status: AC
  Administered 2017-09-15: 25 mg via ORAL
  Filled 2017-09-15: qty 1

## 2017-09-15 MED ORDER — AMLODIPINE BESYLATE 5 MG PO TABS
5.0000 mg | ORAL_TABLET | Freq: Every day | ORAL | Status: DC
  Administered 2017-09-15 – 2017-09-16 (×2): 5 mg via ORAL
  Filled 2017-09-15 (×2): qty 1

## 2017-09-15 MED ORDER — HYDRALAZINE HCL 20 MG/ML IJ SOLN
10.0000 mg | Freq: Four times a day (QID) | INTRAMUSCULAR | Status: DC | PRN
  Administered 2017-09-16 (×2): 10 mg via INTRAVENOUS
  Filled 2017-09-15 (×2): qty 1

## 2017-09-15 NOTE — Progress Notes (Addendum)
Pt complaining of severe headache 9/10. Pt is clammy and anxious. CIWA score of 8 no ativan ordered. VS WDL and neuro intact. Administered Tylenol. Pt states no relief. Describes headache as pounding on the left temple.   Have notified MD Nori Riis, awaiting response.    1120 pt is sitting up in chair and has washed up. Pt states he feels a lot better and rates his headache at a 3/10. No complaints at this time.  Will continue to monitor.

## 2017-09-15 NOTE — Progress Notes (Signed)
Spoke with MD and was given verbal orders to give IMDUR at this time d/t BP being high. Medication was given.   Eleanora Neighbor, RN

## 2017-09-15 NOTE — Progress Notes (Addendum)
[name] Subjective/Interval History:   Complains of headache and "just want to get some sleep" Thinking about his CKD options Seems more open but not decided  Objective Vital signs in last 24 hours: Vitals:   09/15/17 0529 09/15/17 0743 09/15/17 0746 09/15/17 0919  BP: (!) 187/82 (!) 165/81 (!) 163/77 115/72  Pulse: (!) 56 62 64 (!) 58  Resp: 18  20   Temp: 98.2 F (36.8 C)  98.5 F (36.9 C)   TempSrc: Oral  Oral   SpO2: 98% 97% 97%   Weight:      Height:       Weight change:   Intake/Output Summary (Last 24 hours) at 09/15/17 1001 Last data filed at 09/15/17 0900  Gross per 24 hour  Intake              597 ml  Output             2100 ml  Net            -1503 ml   Physical Exam:  Blood pressure 115/72, pulse (!) 58, temperature 98.5 F (36.9 C), temperature source Oral, resp. rate 20, height 5\' 5"  (1.651 m), weight 78.3 kg (172 lb 9.6 oz), SpO2 97 %.  Mildly disheveled in appearance Very pleasant, soft spoken No JVD Lungs clear [id number] Soft nontender abdomen No edema of LE's No asterixus   Recent Labs Lab 09/11/17 1111 09/12/17 0218 09/13/17 0354 09/14/17 0317 09/15/17 0251  NA 136 137 133* 134* 135  K 4.1 3.7 4.0 4.5 4.0  CL 108 110 107 105 108  CO2 19* 17* 19* 18* 18*  GLUCOSE 120* 103* 90 94 109*  BUN 42* 47* 64* 75* 76*  CREATININE 3.64* 4.02* 4.75* 4.90* 4.67*  CALCIUM 8.9 8.2* 8.1* 8.3* 8.3*  PHOS  --   --   --   --  4.6    Recent Labs Lab 09/11/17 1111 09/15/17 0251  AST 26  --   ALT 15*  --   ALKPHOS 83  --   BILITOT 0.6  --   PROT 7.2  --   ALBUMIN 3.5 2.4*    Recent Labs Lab 09/11/17 1111  LIPASE 28     Recent Labs Lab 09/11/17 1111 09/12/17 0218 09/13/17 0354 09/14/17 0317  WBC 12.4* 10.9* 11.0* 8.8  NEUTROABS  --   --   --  7.1  HGB 10.8* 9.4* 9.1* 9.1*  HCT 32.8* 28.6* 27.5* 28.0*  MCV 88.9 89.1 88.4 89.2  PLT 173 124* 129* 144*    Iron/TIBC/Ferritin/ %Sat    Component Value Date/Time   IRON 7 (L)  09/13/2017 0354   TIBC 210 (L) 09/13/2017 0354   FERRITIN 111 09/13/2017 0354   IRONPCTSAT 3 (L) 09/13/2017 0354   Medications:  . carvedilol  12.5 mg Oral BID WC  . ciprofloxacin  500 mg Oral Q24H  . folic acid  1 mg Oral Daily  . heparin  5,000 Units Subcutaneous Q8H  . hydrALAZINE  25 mg Oral BID  . Influenza vac split quadrivalent PF  0.5 mL Intramuscular Tomorrow-1000  . isosorbide mononitrate  30 mg Oral q morning - 10a  . multivitamin with minerals  1 tablet Oral Daily  . pneumococcal 23 valent vaccine  0.5 mL Intramuscular Tomorrow-1000  . tamsulosin  0.4 mg Oral Daily  . thiamine  100 mg Oral Daily   Or  . thiamine  100 mg Intravenous Daily    Background: 8M with a  PMH significant for HTN, dCHF, COPD and a h/o BPH with urinary retention who is now seen in consultation at the request of Dr. 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 Past CKD history from admission to pt [facility] 05/2017 w/SOB and hypertensive emergency, diastolic CHF exacerbation.  Creatinine at that hospitalization ranged from 3.2-3.9 (discharged with a Cr of 3.56).  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 Workup then BPH with mild hydro/bladder distention on renal US.  Kidneys echogenic. Had Foley catheter placed/removed prior to discharge. Admitted on current occasion with L renal pyelo (and notably R atrophic kidney). Dilated L ureter. Thick walled bladder. Creatinine 3.6-4. We were asked to see.   Assessment/Plan  1. AKI on CKD IV: Underlying CKD 2/2 poorly controlled HTN +/- renal artery stenosis given atrophic R kidney.  Doesn't seem to have a great contribution from obstruction.  AKI hemodynamically driven in the setting of n/v and pyelonephritis and BP control. Creatinine has reached plateau and starting to decline some. 1. Talked again  about his CKD. Softening some. Discussed merits of getting a vascular access done (which if he then decides to do HD down the road would be in place). If he agrees this should be done as INPATIENT as his ability to follow with outpt appts wil be limited.  2. He will let us know tomorrow of his decision. I am also happy to follow in the office if he agrees to let us make an appt 2    Pyelonephritis: Ceftriaxone-->now cipro 3.   Diastolic CHF: appears to be relatively euvolemic at this time.  Not on Lasix.  EF 06-26% with diastolic dysfunction 08/4853. 4.   Hypertension: well controlled  - No ACEi/ARB due to severely reduced GFR. "Normalization" of BP hemodynamically driving AKI on CKD (627'O->350'K). Creatinine is now reaching a plateau and starting to fall 5.   BPH - with prior hydro (last adm).  6.   Anemia - needs Fe. Best IV. Feraheme dosed 10/6. 7.   Disposition - homeless, has variously lived in the woods, horse trailers, w/friends. Says "will probably go to the woods when I leave". "I'd die before I'd go to a shelter".  Dr. Justin Mend will be seeing pt this coming week   [name], MD [facility] [phone] pager 09/15/2017, 10:01 AM

## 2017-09-15 NOTE — Progress Notes (Signed)
Messaged MD regarding BP 187/82, P 56 this am. Awaiting response.   Eleanora Neighbor, RN

## 2017-09-15 NOTE — Progress Notes (Signed)
Family Medicine Teaching Service Daily Progress Note Intern Pager: [phone]  Patient name: Kerry Underwood Medical record number: [id number] Date of birth: [birth year] Age: 66 y.o. Gender: male  Primary Care Provider: Patient, No Pcp Per Consultants: nephrology Code Status: Full  Assessment and Plan: Kerry Underwood is a 66 year old male with PMH of HTN, COPD, diastolic heart failure, bph, ckdIII, homelessness presenting with abdominal pain and dysuria and found to have pyelonephritis.  Pyelonephritis: Resolving. Presented with 1 day history of abdominal pain and back pain with urination, foul smelling urine and dysuria. CT showed perinephric edema and stranding with slight enlargement of ureter consistent with pyelonephritis. Urine culture was positive for >100,000 colonies  Escherichia coli. Sensitivities shows almost and pansensitivity. Afebrile and resolved leukocytosis. Improved CVA tenderness. S/p 3 day course of Ceftriaxone (10/3-10/5).  -Vitals per floor protocol -Ciprofloxacin 500 mg twice a day (10/6- ) -Continue tylenol prn for pain and fever -Continue zofran prn for nausea  Acute on CKD IV: Multifactoral;  Poorly controlled BP, RAS, BPH. Baseline creatinine around 3.5 per OSH records. Cr 4.75>4.9>4.67. UO: 1.6 L over last 24 hours. Nephro recommending HD, patient still deciding. - Nephrology consulted, appreciate assistance - avoid nephrotoxic medications  - Daily BMP  HFpEF: Diastolic heart failure per echo at last admission in June at OSH. Patent with occasionally desat while laying flat down in ed. Euvolemic on exam.  - will continue to monitor fluid status - avoid giving fluids if possible - No ACE/ARB to to severe renal impairment - Continue home Coreg  COPD: He is not on any controlled medications and is not requiring any oxygen supplementation at this time - duonebs q6prn - needs outpatient follow up for PFTs  Hypertension: On admission BP was 233/103. Blood pressure has  been much better controlled since admission though still up above goal. This morning BP 163/77. Likely RAS contributing  - continue home coreg - continue home hydralazine for today, stop on 10/8 - Start Amlodipine 5 mg  - continue home imdur - PRN Hydralazine with parameters  BPH: Patient is currently on Flomax with some signs of retention on admission. Currently wearing a condom cath. PVR 83 cc on 10/6.  - continue flomax - place foley if he is not urinating or fails another PVR  Poor social situation Homelessness and questionable h/o ETOH abuse. Denies ETOH used in years on admission but had reportedly told ED provider that drinks 6 pack per day. ETOH level <10. - Social work consulted - monitor under CIWA protocol  FEN/GI: regular diet PPx: heparin 5000U  Disposition: SNF?   Subjective:  No events overnight. No complaints this morning. Not endorsing any pain.   Objective: Temp:  [97.8 F (36.6 C)-99.1 F (37.3 C)] 98.5 F (36.9 C) (10/07 0746) Pulse Rate:  [54-64] 64 (10/07 0746) Resp:  [18-20] 20 (10/07 0746) BP: (150-196)/(60-82) 163/77 (10/07 0746) SpO2:  [97 %-100 %] 97 % (10/07 0746)   Physical Exam: General: NAD Cardiovascular: regular rate and rhythm, palpable DP bilaterally  Respiratory: Normal work of breathing, faint course breath sounds at the bases Abdomen: soft, non-distended, non tender. No rebound. Bowel sounds present Back: Very mild L CVA tenderness Extremities: Moves all extremities spontaneously   Laboratory:  Recent Labs Lab 09/12/17 0218 09/13/17 0354 09/14/17 0317  WBC 10.9* 11.0* 8.8  HGB 9.4* 9.1* 9.1*  HCT 28.6* 27.5* 28.0*  PLT 124* 129* 144*    Recent Labs Lab 09/11/17 1111  09/13/17 0354 09/14/17 0317 09/15/17 0251  NA  136  < > 133* 134* 135  K 4.1  < > 4.0 4.5 4.0  CL 108  < > 107 105 108  CO2 19*  < > 19* 18* 18*  BUN 42*  < > 64* 75* 76*  CREATININE 3.64*  < > 4.75* 4.90* 4.67*  CALCIUM 8.9  < > 8.1* 8.3* 8.3*   PROT 7.2  --   --   --   --   BILITOT 0.6  --   --   --   --   ALKPHOS 83  --   --   --   --   ALT 15*  --   --   --   --   AST 26  --   --   --   --   GLUCOSE 120*  < > 90 94 109*  < > = values in this interval not displayed.  Imaging/Diagnostic Tests: Study Result   CLINICAL DATA:  Intermittent shortness of breath, rhonchi. Hand edema. History of CHF, COPD.  EXAM: CHEST  2 VIEW  COMPARISON:  None.  FINDINGS: Cardiomediastinal silhouette is normal. Calcified aortic arch. No pleural effusions or focal consolidations. Mild bronchitic changes. Trachea projects midline and there is no pneumothorax. Soft tissue planes and included osseous structures are non-suspicious. Old LEFT mid clavicle fracture.  IMPRESSION: Mild bronchitic changes without focal consolidation.   Electronically Signed   By: Elon Alas M.D.   On: 09/11/2017 22:09     Carlyle Dolly, MD 09/15/2017, 8:54 AM PGY-3, [place] Intern pager: [phone], text pages welcome

## 2017-09-16 DIAGNOSIS — I1311 Hypertensive heart and chronic kidney disease without heart failure, with stage 5 chronic kidney disease, or end stage renal disease: Secondary | ICD-10-CM

## 2017-09-16 DIAGNOSIS — N179 Acute kidney failure, unspecified: Secondary | ICD-10-CM

## 2017-09-16 DIAGNOSIS — I151 Hypertension secondary to other renal disorders: Secondary | ICD-10-CM

## 2017-09-16 DIAGNOSIS — N189 Chronic kidney disease, unspecified: Secondary | ICD-10-CM

## 2017-09-16 DIAGNOSIS — N401 Enlarged prostate with lower urinary tract symptoms: Secondary | ICD-10-CM | POA: Diagnosis present

## 2017-09-16 DIAGNOSIS — I10 Essential (primary) hypertension: Secondary | ICD-10-CM

## 2017-09-16 DIAGNOSIS — N185 Chronic kidney disease, stage 5: Secondary | ICD-10-CM

## 2017-09-16 DIAGNOSIS — R338 Other retention of urine: Secondary | ICD-10-CM

## 2017-09-16 DIAGNOSIS — J449 Chronic obstructive pulmonary disease, unspecified: Secondary | ICD-10-CM

## 2017-09-16 DIAGNOSIS — N2889 Other specified disorders of kidney and ureter: Secondary | ICD-10-CM

## 2017-09-16 LAB — RENAL FUNCTION PANEL
Albumin: 2.5 g/dL — ABNORMAL LOW (ref 3.5–5.0)
Anion gap: 11 (ref 5–15)
BUN: 72 mg/dL — AB (ref 6–20)
CALCIUM: 8.5 mg/dL — AB (ref 8.9–10.3)
CO2: 20 mmol/L — AB (ref 22–32)
CREATININE: 4.81 mg/dL — AB (ref 0.61–1.24)
Chloride: 109 mmol/L (ref 101–111)
GFR calc non Af Amer: 11 mL/min — ABNORMAL LOW (ref >60)
GFR, EST AFRICAN AMERICAN: 13 mL/min — AB (ref >60)
GLUCOSE: 100 mg/dL — AB (ref 65–99)
Phosphorus: 4.7 mg/dL — ABNORMAL HIGH (ref 2.5–4.6)
Potassium: 4.3 mmol/L (ref 3.5–5.1)
SODIUM: 140 mmol/L (ref 135–145)

## 2017-09-16 MED ORDER — METOPROLOL TARTRATE 25 MG PO TABS
25.0000 mg | ORAL_TABLET | Freq: Two times a day (BID) | ORAL | Status: DC
  Administered 2017-09-16 – 2017-09-17 (×3): 25 mg via ORAL
  Filled 2017-09-16 (×4): qty 1

## 2017-09-16 MED ORDER — AMLODIPINE BESYLATE 10 MG PO TABS
10.0000 mg | ORAL_TABLET | Freq: Every day | ORAL | Status: DC
  Administered 2017-09-17: 10 mg via ORAL
  Filled 2017-09-16: qty 1

## 2017-09-16 MED ORDER — LABETALOL HCL 200 MG PO TABS: 200.0000 mg | ORAL_TABLET | Freq: Two times a day (BID) | ORAL | Status: DC

## 2017-09-16 NOTE — Progress Notes (Signed)
Family Medicine Teaching Service Daily Progress Note Intern Pager: [phone]  Patient name: Kerry Underwood Medical record number: [id number] Date of birth: 05-07-1951 Age: 66 y.o. Gender: male  Primary Care Provider: Patient, No Pcp Per Consultants: Nephrology  Code Status: Full   Pt Overview and Major Events to Date:  Admitted to [facility] on 09/11/2017  Assessment and Plan: Torez Beauregard is a 66 year old male with PMH of HTN, COPD, diastolic heart failure, bph, ckdIII, and homelessness presenting with abdominal pain and dysuria and found to have pyelonephritis.  Pyelonephritis, improving Presented with 1 day history of abdominal pain and back pain with urination, foul smelling urine and dysuria. CT showed perinephric edema and stranding with slight enlargement of ureter consistent with pyelonephritis. Urine culture was positive for >100,000 colonies  Escherichia coli. Sensitivities shows almost pansensitivity but resistance to ampicillin and ampicillin/sulbactam. Afebrile and resolved leukocytosis. Improved CVA tenderness. S/p 3 day course of Ceftriaxone (10/3-10/5).  -Vitals per floor protocol -Ciprofloxacin 500 mg twice a day (10/6- ) -Continue tylenol prn for pain and fever -Continue zofran prn for nausea  Acute on CKD IV Multifactoral;  Poorly controlled BP, RAS, BPH. Baseline creatinine around 3.5 per OSH records. Cr 4.75>4.9>4.67>4.81. UO: 1.665 L over last 24 hours. Nephro recommending HD, patient still deciding. - continue to discuss risks and benefits of dialysis with patient  - Nephrology consulted, appreciate assistance - avoid nephrotoxic medications  - Daily BMP  HFpEF Diastolic heart failure per echo at last admission in June at OSH. Patent with occasionally desat while laying flat down in ed. Euvolemic on exam. Patient had episodes of bradycardia overnight, so coreg was reduced to 3.125 mg bid - will continue to monitor fluid status - avoid giving fluids if possible - No  ACE/ARB to to severe renal impairment - discontinue home Coreg - metoprolol 25 mg bid, titrate as needed  COPD He is not on any controlled medications and is not requiring any oxygen supplementation at this time - discontinue coreg due to history of COPD - duonebs q6prn - needs outpatient follow up for PFTs  Hypertension On admission BP was 233/103. Blood pressure has been much better controlled since admission though still up above goal. This morning BP 157/72. Likely RAS contributing. Goal BP of 130/80. - discontinue home coreg - discontinue home hydralazine for today  - increase Amlodipine to 10 mg  - continue home imdur 30 mg - PRN Hydralazine with parameters - metoprolol 25 mg bid, titrate as needed  BPH Patient is currently on Flomax with some signs of retention on admission. Currently wearing a condom cath. PVR 83 cc on 10/6. Consider alpha blocker if continued hypertension and urinary retention.  - continueflomax - place foley if he is not urinating or fails another PVR  Poor social situation Homelessness and questionable h/o ETOH abuse. Denies ETOH used in years on admission but had reportedly told ED provider that drinks 6 pack per day. ETOH level <10. Patient today states that he is unable to afford medications.  - case management consulted and will determine if patient meets criteria for medication assistance  - Social work consulted - monitor under CIWA protocol  FEN/GI: regular diet PPx: heparin 5000U  Disposition: will be discharged home (patient refusing SNF)   Subjective:  Patient today states he feels "ok". Denies pain or odor on urination. Denies headache, dizziness, or vision changes. Patient today states "I know my blood pressure will be high" as an outpatient. Patient states he cannot afford HTN medications.  Objective: Temp:  [97.9 F (36.6 C)-98.7 F (37.1 C)] 97.9 F (36.6 C) (10/08 1020) Pulse Rate:  [55-66] 65 (10/08 1020) Resp:  [16] 16  (10/08 1020) BP: (109-198)/(53-97) 198/97 (10/08 1213) SpO2:  [99 %-100 %] 100 % (10/08 1020) Weight:  [172 lb (78 kg)] 172 lb (78 kg) (10/07 2028)  I/O last 3 completed shifts: In: 684 [P.O.:684] Out: 2415 [Urine:2415] Total I/O In: 360 [P.O.:360] Out: 125 [Urine:125]  Physical Exam: General: awake and alert, sitting up in bed  Cardiovascular: RRR, no MRG  Respiratory: CTAB, no wheezes, rales, or rhonchi  Abdomen: soft, non tender, non distended, bowel sounds x 4 quadrants  Extremities: no edema, non tender   Laboratory:  Recent Labs Lab 09/12/17 0218 09/13/17 0354 09/14/17 0317  WBC 10.9* 11.0* 8.8  HGB 9.4* 9.1* 9.1*  HCT 28.6* 27.5* 28.0*  PLT 124* 129* 144*    Recent Labs Lab 09/11/17 1111  09/14/17 0317 09/15/17 0251 09/16/17 0348  NA 136  < > 134* 135 140  K 4.1  < > 4.5 4.0 4.3  CL 108  < > 105 108 109  CO2 19*  < > 18* 18* 20*  BUN 42*  < > 75* 76* 72*  CREATININE 3.64*  < > 4.90* 4.67* 4.81*  CALCIUM 8.9  < > 8.3* 8.3* 8.5*  PROT 7.2  --   --   --   --   BILITOT 0.6  --   --   --   --   ALKPHOS 83  --   --   --   --   ALT 15*  --   --   --   --   AST 26  --   --   --   --   GLUCOSE 120*  < > 94 109* 100*  < > = values in this interval not displayed.   Imaging/Diagnostic Tests: Dg Chest 2 View  Result Date: 09/11/2017 CLINICAL DATA:  Intermittent shortness of breath, rhonchi. Hand edema. History of CHF, COPD. EXAM: CHEST  2 VIEW COMPARISON:  None. FINDINGS: Cardiomediastinal silhouette is normal. Calcified aortic arch. No pleural effusions or focal consolidations. Mild bronchitic changes. Trachea projects midline and there is no pneumothorax. Soft tissue planes and included osseous structures are non-suspicious. Old LEFT mid clavicle fracture. IMPRESSION: Mild bronchitic changes without focal consolidation. Electronically Signed   By: Elon Alas M.D.   On: 09/11/2017 22:09   Ct Renal Stone Study  Result Date: 09/11/2017 CLINICAL DATA:  Left  lower abdominal pain EXAM: CT ABDOMEN AND PELVIS WITHOUT CONTRAST TECHNIQUE: Multidetector CT imaging of the abdomen and pelvis was performed following the standard protocol without IV contrast. COMPARISON:  None. FINDINGS: Lower chest: Lung bases demonstrate no acute consolidation or effusion. Normal heart size. Small hiatal hernia. Hepatobiliary: Lobulated liver contour. No focal hepatic abnormality, calcified gallstone or biliary dilatation Pancreas: Unremarkable. No pancreatic ductal dilatation or surrounding inflammatory changes. Spleen: Normal in size without focal abnormality. Adrenals/Urinary Tract: Adrenal glands are within normal limits. Markedly atrophic right kidney without hydronephrosis. Punctate stone in the upper pole of the right kidney. Moderate-to-marked left perinephric edema and fat stranding. 2 cm low-density lesion mid left kidney. No significant left hydronephrosis. Slightly enlarged mid to distal left ureter. No stones along the course of the left ureter. Slightly thick-walled appearance of the bladder Stomach/Bowel: Stomach is within normal limits. Appendix appears normal. No evidence of bowel wall thickening, distention, or inflammatory changes. Vascular/Lymphatic: Aortic atherosclerosis. Mild retroperitoneal adenopathy with  left periaortic lymph node measuring up to 9 mm. Reproductive: Slightly enlarged prostate Other: Negative for free air or free fluid. Within the bilateral inguinal canals Musculoskeletal: No acute or suspicious bone lesion IMPRESSION: 1. Moderate-to-marked left perinephric edema and fat stranding. No significant left hydronephrosis, slight enlargement of the mid to distal left ureter however no definitive ureteral stone is visualized. Differential considerations include recently passed stone ; could also consider pyelonephritis. 2. Thick-walled appearance of the bladder suggesting cystitis or chronic obstruction 3. Very atrophic right kidney without hydronephrosis.  Punctate stone in the upper pole of the right kidney 4. 2 cm hypodensity mid pole left kidney, possible cyst but further evaluation limited without contrast 5. Lobulated liver contour, query cirrhosis or hepatocellular disease Electronically Signed   By: Donavan Foil M.D.   On: 09/11/2017 16:55    Caroline More, DO 09/16/2017, 12:27 PM PGY-1, [place] Intern pager: [phone], text pages welcome

## 2017-09-16 NOTE — Progress Notes (Signed)
[facility] ROUNDING NOTE   Subjective:   Interval History:  Chronic renal disease stage 5 with poorly controlled hypertension and quite agreeable this morning  He understands the severity of his renal disease and has some social barriers that are going to preclude his outpatient management  It would be desirable to place access here as transportation is going to be a problem.    Objective:  Vital signs in last 24 hours:  Temp:  [97.9 F (36.6 C)-98.7 F (37.1 C)] 97.9 F (36.6 C) (10/08 1020) Pulse Rate:  [55-66] 65 (10/08 1020) Resp:  [16] 16 (10/08 1020) BP: (109-198)/(53-80) 198/78 (10/08 1020) SpO2:  [99 %-100 %] 100 % (10/08 1020) Weight:  [172 lb (78 kg)] 172 lb (78 kg) (10/07 2028)  Weight change:  Filed Weights   09/12/17 2118 09/13/17 2112 09/15/17 2028  Weight: 168 lb (76.2 kg) 172 lb 9.6 oz (78.3 kg) 172 lb (78 kg)    Intake/Output: I/O last 3 completed shifts: In: 684 [P.O.:684] Out: 2415 [Urine:2415]   Intake/Output this shift:  Total I/O In: 360 [P.O.:360] Out: 125 [Urine:125]  CVS- RRR RS- CTA ABD- BS present soft non-distended EXT- no edema   Basic Metabolic Panel:  Recent Labs Lab 09/12/17 0218 09/13/17 0354 09/14/17 0317 09/15/17 0251 09/16/17 0348  NA 137 133* 134* 135 140  K 3.7 4.0 4.5 4.0 4.3  CL 110 107 105 108 109  CO2 17* 19* 18* 18* 20*  GLUCOSE 103* 90 94 109* 100*  BUN 47* 64* 75* 76* 72*  CREATININE 4.02* 4.75* 4.90* 4.67* 4.81*  CALCIUM 8.2* 8.1* 8.3* 8.3* 8.5*  PHOS  --   --   --  4.6 4.7*    Liver Function Tests:  Recent Labs Lab 09/11/17 1111 09/15/17 0251 09/16/17 0348  AST 26  --   --   ALT 15*  --   --   ALKPHOS 83  --   --   BILITOT 0.6  --   --   PROT 7.2  --   --   ALBUMIN 3.5 2.4* 2.5*    Recent Labs Lab 09/11/17 1111  LIPASE 28   No results for input(s): AMMONIA in the last 168 hours.  CBC:  Recent Labs Lab 09/11/17 1111 09/12/17 0218 09/13/17 0354 09/14/17 0317  WBC  12.4* 10.9* 11.0* 8.8  NEUTROABS  --   --   --  7.1  HGB 10.8* 9.4* 9.1* 9.1*  HCT 32.8* 28.6* 27.5* 28.0*  MCV 88.9 89.1 88.4 89.2  PLT 173 124* 129* 144*    Cardiac Enzymes: No results for input(s): CKTOTAL, CKMB, CKMBINDEX, TROPONINI in the last 168 hours.  BNP: Invalid input(s): POCBNP  CBG:  Recent Labs Lab 09/15/17 0919  GLUCAP 107*    Microbiology: Results for orders placed or performed during the hospital encounter of 09/11/17  Urine culture     Status: Abnormal   Collection Time: 09/11/17 11:55 AM  Result Value Ref Range Status   Specimen Description URINE, CLEAN CATCH  Final   Special Requests NONE  Final   Culture >=100,000 COLONIES/mL ESCHERICHIA COLI (A)  Final   Report Status 09/13/2017 FINAL  Final   Organism ID, Bacteria ESCHERICHIA COLI (A)  Final      Susceptibility   Escherichia coli - MIC*    AMPICILLIN >=32 RESISTANT Resistant     CEFAZOLIN <=4 SENSITIVE Sensitive     CEFTRIAXONE <=1 SENSITIVE Sensitive     CIPROFLOXACIN <=0.25 SENSITIVE Sensitive  GENTAMICIN <=1 SENSITIVE Sensitive     IMIPENEM <=0.25 SENSITIVE Sensitive     NITROFURANTOIN <=16 SENSITIVE Sensitive     TRIMETH/SULFA <=20 SENSITIVE Sensitive     AMPICILLIN/SULBACTAM >=32 RESISTANT Resistant     PIP/TAZO <=4 SENSITIVE Sensitive     Extended ESBL NEGATIVE Sensitive     * >=100,000 COLONIES/mL ESCHERICHIA COLI    Coagulation Studies: No results for input(s): LABPROT, INR in the last 72 hours.  Urinalysis: No results for input(s): COLORURINE, LABSPEC, PHURINE, GLUCOSEU, HGBUR, BILIRUBINUR, KETONESUR, PROTEINUR, UROBILINOGEN, NITRITE, LEUKOCYTESUR in the last 72 hours.  Invalid input(s): APPERANCEUR    Imaging: No results found.   Medications:    . amLODipine  5 mg Oral Daily  . ciprofloxacin  500 mg Oral Q24H  . folic acid  1 mg Oral Daily  . heparin  5,000 Units Subcutaneous Q8H  . Influenza vac split quadrivalent PF  0.5 mL Intramuscular Tomorrow-1000  .  isosorbide mononitrate  30 mg Oral q morning - 10a  . labetalol  200 mg Oral BID  . multivitamin with minerals  1 tablet Oral Daily  . pneumococcal 23 valent vaccine  0.5 mL Intramuscular Tomorrow-1000  . tamsulosin  0.4 mg Oral Daily  . thiamine  100 mg Oral Daily   Or  . thiamine  100 mg Intravenous Daily   acetaminophen **OR** acetaminophen, hydrALAZINE, ipratropium-albuterol, ondansetron **OR** ondansetron (ZOFRAN) IV, polyethylene glycol  Assessment/ Plan:  1. AKI on CKD IV: Underlying CKD 2/2 poorly controlled HTN +/- renal artery stenosis given atrophic R kidney. Doesn't seem to have a great contribution from obstruction. AKI hemodynamically driven in the setting of n/v and pyelonephritis and BP control. Creatinine has reached plateau and starting to decline some. 2    Pyelonephritis: Ceftriaxone-->now cipro 3.   Diastolic CHF: appears to be relatively euvolemic at this time. Not on Lasix. EF 78-93% with diastolic dysfunction 07/1016. 4.  Hypertension: well controlled - No ACEi/ARB due to severely reduced GFR. "Normalization" of BP hemodynamically driving AKI on CKD (510'C->585'I). Creatinine is now reaching a plateau and starting to fall 5.   BPH - with prior hydro (last adm).  6.   Anemia - needs Fe. Best IV. Feraheme dosed 10/6. 7.   Disposition - homeless, has variously lived in the woods, horse trailers, w/friends. Says "will probably go to the woods when I leave". "I'd die before I'd go to a shelter".   Will continue to pursue access placement in hospital if patient is agreeable   LOS: 5 Atul Delucia W @TODAY @10 :42 AM

## 2017-09-16 NOTE — Progress Notes (Signed)
Physical Therapy Treatment Patient Details Name: Kerry Underwood MRN: [id number] DOB: 1951/10/26 Today's Date: 09/16/2017    History of Present Illness 66 year old male who presented on 10/3 with 1 day history of n/v, abdominal pain, back pain, foul smelling urine. CT scan showed atropic right kidney and perinephric stranding around left kidney consistent with pyelonephritis.  Creatinine continued to increase and patient refused dialysis.      PT Comments    Continuing work on functional mobility and activity tolerance, with noted improvements in balance and activity tolerance; Walked the hallways carrying a cup on coffee, and while his gait pattern was inefficient, he still had no gross losses of balance and did not spill a drop!   Follow Up Recommendations  No PT follow up (pt has no means for f/u PT)     Equipment Recommendations  None recommended by PT    Recommendations for Other Services       Precautions / Restrictions Precautions Precautions: Fall    Mobility  Bed Mobility Overal bed mobility: Independent                Transfers Overall transfer level: Modified independent Equipment used: None                Ambulation/Gait Ambulation/Gait assistance: Supervision Ambulation Distance (Feet): 250 Feet Assistive device: None Gait Pattern/deviations: Step-through pattern;Decreased stride length;Decreased weight shift to right;Decreased weight shift to left Gait velocity: decreased   General Gait Details: Slow gait, with decr weight shift laterally onto stance leg R and L, leading to shorter step length, and less efficient pattern; still, no loss of balance noted; He held a cup on coffee (no top) during our walk as well, and to his credit, no spills   Stairs Stairs: Yes   Stair Management: One rail Right;Forwards;Alternating pattern Number of Stairs: 12 General stair comments: No difficulty noted; no coffee spills either  Wheelchair Mobility     Modified Rankin (Stroke Patients Only)       Balance     Sitting balance-Leahy Scale: Good       Standing balance-Leahy Scale: Good                              Cognition Arousal/Alertness: Awake/alert Behavior During Therapy: WFL for tasks assessed/performed Overall Cognitive Status: Within Functional Limits for tasks assessed                                        Exercises      General Comments        Pertinent Vitals/Pain Pain Assessment: No/denies pain    Home Living                      Prior Function            PT Goals (current goals can now be found in the care plan section) Acute Rehab PT Goals Patient Stated Goal: get back to the woods PT Goal Formulation: With patient Time For Goal Achievement: 09/21/17 Potential to Achieve Goals: Good Progress towards PT goals: Progressing toward goals    Frequency    Min 3X/week      PT Plan Current plan remains appropriate    Co-evaluation              AM-PAC PT "6 Clicks" Daily  Activity  Outcome Measure  Difficulty turning over in bed (including adjusting bedclothes, sheets and blankets)?: None Difficulty moving from lying on back to sitting on the side of the bed? : None Difficulty sitting down on and standing up from a chair with arms (e.g., wheelchair, bedside commode, etc,.)?: None Help needed moving to and from a bed to chair (including a wheelchair)?: None Help needed walking in hospital room?: None Help needed climbing 3-5 steps with a railing? : A Little 6 Click Score: 23    End of Session Equipment Utilized During Treatment: Gait belt Activity Tolerance: Patient tolerated treatment well Patient left: in chair;with chair alarm set;with call bell/phone within reach Nurse Communication: Mobility status PT Visit Diagnosis: Unsteadiness on feet (R26.81);Muscle weakness (generalized) (M62.81)     Time: 2355-7322 PT Time Calculation (min)  (ACUTE ONLY): 20 min  Charges:  $Gait Training: 8-22 mins                    G Codes:       Roney Marion, PT  Acute Rehabilitation Services Pager [phone] Office [place] 09/16/2017, 10:02 AM

## 2017-09-16 NOTE — Care Management Important Message (Signed)
Important Message  Patient Details  Name: Kerry Underwood MRN: [id number] Date of Birth: Aug 01, 1951   Medicare Important Message Given:  Yes    Lurline Caver Abena 09/16/2017, 8:17 AM

## 2017-09-16 NOTE — Progress Notes (Signed)
Patient refused bed alarm. Falls education done with patient and patient verbalizes understanding

## 2017-09-17 LAB — BASIC METABOLIC PANEL
ANION GAP: 10 (ref 5–15)
BUN: 72 mg/dL — AB (ref 6–20)
CHLORIDE: 109 mmol/L (ref 101–111)
CO2: 21 mmol/L — ABNORMAL LOW (ref 22–32)
Calcium: 8.6 mg/dL — ABNORMAL LOW (ref 8.9–10.3)
Creatinine, Ser: 5.15 mg/dL — ABNORMAL HIGH (ref 0.61–1.24)
GFR, EST AFRICAN AMERICAN: 12 mL/min — AB (ref >60)
GFR, EST NON AFRICAN AMERICAN: 10 mL/min — AB (ref >60)
Glucose, Bld: 101 mg/dL — ABNORMAL HIGH (ref 65–99)
POTASSIUM: 5 mmol/L (ref 3.5–5.1)
Sodium: 140 mmol/L (ref 135–145)

## 2017-09-17 MED ORDER — ALBUTEROL SULFATE HFA 108 (90 BASE) MCG/ACT IN AERS: 2.0000 | INHALATION_SPRAY | Freq: Four times a day (QID) | RESPIRATORY_TRACT | 2 refills | Status: DC | PRN

## 2017-09-17 MED ORDER — TAMSULOSIN HCL 0.4 MG PO CAPS: 0.4000 mg | ORAL_CAPSULE | Freq: Every day | ORAL | 0 refills | Status: DC

## 2017-09-17 MED ORDER — CIPROFLOXACIN HCL 500 MG PO TABS: 500.0000 mg | ORAL_TABLET | ORAL | 0 refills | Status: DC

## 2017-09-17 MED ORDER — METOPROLOL TARTRATE 25 MG PO TABS: 25.0000 mg | ORAL_TABLET | Freq: Two times a day (BID) | ORAL | 0 refills | Status: DC

## 2017-09-17 MED ORDER — TIOTROPIUM BROMIDE MONOHYDRATE 18 MCG IN CAPS: 18.0000 ug | ORAL_CAPSULE | Freq: Every day | RESPIRATORY_TRACT | 2 refills | Status: DC

## 2017-09-17 MED ORDER — AMLODIPINE BESYLATE 10 MG PO TABS: 10.0000 mg | ORAL_TABLET | Freq: Every day | ORAL | 0 refills | Status: DC

## 2017-09-17 MED ORDER — ADULT MULTIVITAMIN W/MINERALS CH: 1.0000 | ORAL_TABLET | Freq: Every day | ORAL | 0 refills | Status: DC

## 2017-09-17 NOTE — Progress Notes (Signed)
[facility] ROUNDING NOTE   Subjective:      Interval History:  Chronic renal disease stage 5 with poorly controlled hypertension and quite agreeable this morning  He understands the severity of his renal disease and has some social barriers that are going to preclude his outpatient management  It would be desirable to place access here as transportation is going to be a problem.  Patient is not thrilled about placement of access as he is concerned about complications and the fact it may limit his ability to self care and care for his animals    Objective:  Vital signs in last 24 hours:  Temp:  [97.8 F (36.6 C)-98.5 F (36.9 C)] 98.5 F (36.9 C) (10/09 0430) Pulse Rate:  [59-65] 59 (10/09 0430) Resp:  [16-18] 16 (10/09 0430) BP: (156-198)/(75-97) 156/75 (10/09 0430) SpO2:  [99 %-100 %] 99 % (10/09 0430) Weight:  [163 lb (73.9 kg)] 163 lb (73.9 kg) (10/08 2131)  Weight change: -9 lb (-4.082 kg) Filed Weights   09/13/17 2112 09/15/17 2028 09/16/17 2131  Weight: 172 lb 9.6 oz (78.3 kg) 172 lb (78 kg) 163 lb (73.9 kg)    Intake/Output: I/O last 3 completed shifts: In: 940 [P.O.:940] Out: 2515 [Urine:2515]   Intake/Output this shift:  Total I/O In: 0  Out: 250 [Urine:250]  CVS- RRR RS- CTA ABD- BS present soft non-distended EXT- no edema   Basic Metabolic Panel:  Recent Labs Lab 09/13/17 0354 09/14/17 0317 09/15/17 0251 09/16/17 0348 09/17/17 0344  NA 133* 134* 135 140 140  K 4.0 4.5 4.0 4.3 5.0  CL 107 105 108 109 109  CO2 19* 18* 18* 20* 21*  GLUCOSE 90 94 109* 100* 101*  BUN 64* 75* 76* 72* 72*  CREATININE 4.75* 4.90* 4.67* 4.81* 5.15*  CALCIUM 8.1* 8.3* 8.3* 8.5* 8.6*  PHOS  --   --  4.6 4.7*  --     Liver Function Tests:  Recent Labs Lab 09/11/17 1111 09/15/17 0251 09/16/17 0348  AST 26  --   --   ALT 15*  --   --   ALKPHOS 83  --   --   BILITOT 0.6  --   --   PROT 7.2  --   --   ALBUMIN 3.5 2.4* 2.5*    Recent Labs Lab  09/11/17 1111  LIPASE 28   No results for input(s): AMMONIA in the last 168 hours.  CBC:  Recent Labs Lab 09/11/17 1111 09/12/17 0218 09/13/17 0354 09/14/17 0317  WBC 12.4* 10.9* 11.0* 8.8  NEUTROABS  --   --   --  7.1  HGB 10.8* 9.4* 9.1* 9.1*  HCT 32.8* 28.6* 27.5* 28.0*  MCV 88.9 89.1 88.4 89.2  PLT 173 124* 129* 144*    Cardiac Enzymes: No results for input(s): CKTOTAL, CKMB, CKMBINDEX, TROPONINI in the last 168 hours.  BNP: Invalid input(s): POCBNP  CBG:  Recent Labs Lab 09/15/17 0919  GLUCAP 107*    Microbiology: Results for orders placed or performed during the hospital encounter of 09/11/17  Urine culture     Status: Abnormal   Collection Time: 09/11/17 11:55 AM  Result Value Ref Range Status   Specimen Description URINE, CLEAN CATCH  Final   Special Requests NONE  Final   Culture >=100,000 COLONIES/mL ESCHERICHIA COLI (A)  Final   Report Status 09/13/2017 FINAL  Final   Organism ID, Bacteria ESCHERICHIA COLI (A)  Final      Susceptibility   Escherichia  coli - MIC*    AMPICILLIN >=32 RESISTANT Resistant     CEFAZOLIN <=4 SENSITIVE Sensitive     CEFTRIAXONE <=1 SENSITIVE Sensitive     CIPROFLOXACIN <=0.25 SENSITIVE Sensitive     GENTAMICIN <=1 SENSITIVE Sensitive     IMIPENEM <=0.25 SENSITIVE Sensitive     NITROFURANTOIN <=16 SENSITIVE Sensitive     TRIMETH/SULFA <=20 SENSITIVE Sensitive     AMPICILLIN/SULBACTAM >=32 RESISTANT Resistant     PIP/TAZO <=4 SENSITIVE Sensitive     Extended ESBL NEGATIVE Sensitive     * >=100,000 COLONIES/mL ESCHERICHIA COLI    Coagulation Studies: No results for input(s): LABPROT, INR in the last 72 hours.  Urinalysis: No results for input(s): COLORURINE, LABSPEC, PHURINE, GLUCOSEU, HGBUR, BILIRUBINUR, KETONESUR, PROTEINUR, UROBILINOGEN, NITRITE, LEUKOCYTESUR in the last 72 hours.  Invalid input(s): APPERANCEUR    Imaging: No results found.   Medications:    . amLODipine  10 mg Oral Daily  .  ciprofloxacin  500 mg Oral Q24H  . folic acid  1 mg Oral Daily  . heparin  5,000 Units Subcutaneous Q8H  . isosorbide mononitrate  30 mg Oral q morning - 10a  . metoprolol tartrate  25 mg Oral BID  . multivitamin with minerals  1 tablet Oral Daily  . tamsulosin  0.4 mg Oral Daily  . thiamine  100 mg Oral Daily   acetaminophen **OR** acetaminophen, hydrALAZINE, ipratropium-albuterol, ondansetron **OR** ondansetron (ZOFRAN) IV, polyethylene glycol  Assessment/ Plan:  1. AKI on CKD IV: Underlying CKD 2/2 poorly controlled HTN +/- renal artery stenosis given atrophic R kidney. Doesn't seem to have a great contribution from obstruction. AKI hemodynamically driven in the setting of n/v and pyelonephritis and BP control. Creatinine has reached plateau and starting to decline some. 2 Pyelonephritis: Ceftriaxone-->now cipro 3. Diastolic CHF: appears to be relatively euvolemic at this time. Not on Lasix. EF 68-12% with diastolic dysfunction 06/5169. 4. Hypertension: well controlled - No ACEi/ARB due to severely reduced GFR. "Normalization" of BP hemodynamically driving AKI on CKD (017'C->944'H). Creatinine is now reaching a plateau and starting to fall 5. BPH - with prior hydro (last adm).  6. Anemia - needs Fe. Best IV. Feraheme dosed 10/6. 7. Disposition - homeless, has variously lived in the woods, horse trailers, w/friends. Says "will probably go to the woods when I leave". "I'd die before I'd go to a shelter".    Patient does not really want to have access placed after much discussion. He may not be a candidate for dialysis as his social situation is always going to interfere with his ability to comply with dialysis treatments Would recommend a case manager work with him so a decision can be reached      LOS: 6 Jacklin Zwick W @TODAY @9 :38 AM

## 2017-09-17 NOTE — Progress Notes (Signed)
All discharge instructions including new medications & follow up appointments, reviewed in detail with understanding verbalized. Denies any questions or concerns prior to discharge. Daughter arrived & pt ambulatory off unit in stable condition.

## 2017-09-17 NOTE — Discharge Instructions (Addendum)
Please call your primary care doctor office as soon as possible to schedule hospital follow up!  Do not hesitate to return to the Emergency Department for any new, worsening or concerning symptoms.   If you do not have a primary care doctor you can establish one at the   [facility]: [place] [id number] [phone]  After you establish care. Let them know you were seen in the emergency room. They must obtain records for further management.

## 2017-09-17 NOTE — Care Management Note (Addendum)
Case Management Note  Patient Details  Name: Kerry Underwood MRN: [id number] Date of Birth: [birth year]  Subjective/Objective:      CM following for progression and d/c planning.               Action/Plan: 09/17/2017 Met with pt re d/c needs, pt states that he is unable to have any surgical procedures done at this time. This CM discussed applying for Medicaid with this pt . Pt currently lives in [facility] and uses his brothers address for his SS checks , he can use this address for his application however he will inform the SS dept that he is homeless at this time. This CM encouraged the pt to apply as soon as possible after d/c as the cost of his medications would be covered at a more reasonable rate with Medicaid. Pt understands that he has a chronic disease and that his medications are essential. Will assist in any way that CM can at this time.  [place] letter prepared and given to pt to assist with medications.   Expected Discharge Date:                  Expected Discharge Plan:  Home/Self Care  In-House Referral:  Clinical Social Work  Discharge planning Services  CM Consult  Post Acute Care Choice:  NA Choice offered to:  NA  DME Arranged:    DME Agency:     HH Arranged:  NA HH Agency:  NA  Status of Service:  Completed, signed off  If discussed at [employer] of Stay Meetings, dates discussed:    Additional Comments:  Adron Bene, RN 09/17/2017, 12:48 PM

## 2017-09-17 NOTE — Progress Notes (Signed)
Family Medicine Teaching Service Daily Progress Note Intern Pager: [phone]  Patient name: Kerry Underwood Medical record number: [id number] Date of birth: 06-30-1951 Age: 66 y.o. Gender: male  Primary Care Provider: Patient, No Pcp Per Consultants: Nephrology  Code Status: Full   Pt Overview and Major Events to Date:  Admitted to [facility] on 09/11/2017  Assessment and Plan: Nygel Prokop is a 66 year old male with PMH of HTN, COPD, diastolic heart failure, bph, ckdIII, and homelessness presenting with abdominal pain and dysuria and found to have pyelonephritis. Given that patient has improved significantly he is ready for discharge in terms of his pyelonephritis. He has been contemplating getting a fistula for future dialysis access but he appears to have finally reached a decision regarding this. He wishes to be discharged today.  Pyelonephritis, improving Presented with 1 day history of abdominal pain and back pain with urination, foul smelling urine and dysuria. CT showed perinephric edema and stranding with slight enlargement of ureter consistent with pyelonephritis. Urine culture was positive for >100,000 colonies  Escherichia coli. Sensitivities shows almost pansensitivity but resistance to ampicillin and ampicillin/sulbactam. Afebrile and resolved leukocytosis. Improved CVA tenderness. S/p 3 day course of Ceftriaxone (10/3-10/5).  -Vitals per floor protocol -Ciprofloxacin 500 mg twice a day (10/6- ) -Continue tylenol prn for pain and fever -Continue zofran prn for nausea - likely dc home today  Acute on CKD IV Multifactoral;  Poorly controlled BP, RAS, BPH. Baseline creatinine around 3.5 per OSH records. Cr 4.75>4.9>4.67>4.81>5.15. UO: 1.525 L over last 24 hours. Nephro recommending HD, patient still deciding. Patient is adamant to me that he does not want "dialysis, stating that he doesn't think he can be down for that long" in terms of the weekly time requirements of dialysis as well as  fistula placement. Given that he does not wish to pursue dialysis he can likely be discharged to home today. - continue to discuss risks and benefits of dialysis with patient  - Nephrology consulted, appreciate assistance - avoid nephrotoxic medications  - Daily BMP - likely dc home today  HFpEF Diastolic heart failure per echo at last admission in June at OSH. Patent with occasionally desat while laying flat down in ed. Euvolemic on exam. Patient had coreg stopped on 10/8, switched to metoprolol 25mg  bid. Amlodipine 10mg  started on 10/9. - will continue to monitor fluid status - avoid giving fluids if possible - No ACE/ARB to to severe renal impairment - discontinue home Coreg - metoprolol 25 mg bid, titrate as needed  COPD He is not on any controlled medications and is not requiring any oxygen supplementation at this time - discontinue coreg due to history of COPD - duonebs q6prn - needs outpatient follow up for PFTs  Hypertension On admission BP was 233/103. Blood pressure has been much better controlled since admission though still up above goal. This morning BP 156/75. Likely RAS contributing. Goal BP of 130/80. - discontinue home coreg - discontinue home hydralazine for today  - increase Amlodipine to 10 mg  - continue home imdur 30 mg - PRN Hydralazine with parameters - metoprolol 25 mg bid, titrate as needed  BPH Patient is currently on Flomax with some signs of retention on admission. Currently wearing a condom cath. PVR 83 cc on 10/6. - continueflomax - place foley if he is not urinating or fails another PVR  Poor social situation Homelessness and questionable h/o ETOH abuse. Denies ETOH used in years on admission but had reportedly told ED provider that drinks 6  pack per day. ETOH level <10. Patient today states that he is unable to afford medications.  - case management consulted and will determine if patient meets criteria for medication assistance  - Social  work consulted - monitor under CIWA protocol  FEN/GI: regular diet PPx: heparin 5000U  Disposition: will be discharged home (patient refusing SNF)   Subjective:  Patient doing well this morning. Tolerating PO. Has had several looses bowel movements. Urinating appropriately. Had long discussion with patient regarding dialysis he   Objective: Temp:  [97.8 F (36.6 C)-98.5 F (36.9 C)] 98.5 F (36.9 C) (10/09 0430) Pulse Rate:  [59-65] 59 (10/09 0430) Resp:  [16-18] 16 (10/09 0430) BP: (156-198)/(75-97) 156/75 (10/09 0430) SpO2:  [99 %-100 %] 99 % (10/09 0430) Weight:  [163 lb (73.9 kg)] 163 lb (73.9 kg) (10/08 2131)  I/O last 3 completed shifts: In: 940 [P.O.:940] Out: 2515 [Urine:2515] Total I/O In: 0  Out: 250 [Urine:250]  Physical Exam: General: awake and alert, sitting in bed eating breakfast, no acute distress Cardiovascular: RRR, no m/r/g, palpable radial pulse bilaterally  Respiratory: CTAB, no wheezes, rales, or rhonchi  Abdomen: soft, non tender, non distended, bowel sounds x 4 quadrants, no CVA tenderness Extremities: no edema, non tender   Laboratory:  Recent Labs Lab 09/12/17 0218 09/13/17 0354 09/14/17 0317  WBC 10.9* 11.0* 8.8  HGB 9.4* 9.1* 9.1*  HCT 28.6* 27.5* 28.0*  PLT 124* 129* 144*    Recent Labs Lab 09/11/17 1111  09/15/17 0251 09/16/17 0348 09/17/17 0344  NA 136  < > 135 140 140  K 4.1  < > 4.0 4.3 5.0  CL 108  < > 108 109 109  CO2 19*  < > 18* 20* 21*  BUN 42*  < > 76* 72* 72*  CREATININE 3.64*  < > 4.67* 4.81* 5.15*  CALCIUM 8.9  < > 8.3* 8.5* 8.6*  PROT 7.2  --   --   --   --   BILITOT 0.6  --   --   --   --   ALKPHOS 83  --   --   --   --   ALT 15*  --   --   --   --   AST 26  --   --   --   --   GLUCOSE 120*  < > 109* 100* 101*  < > = values in this interval not displayed.   Imaging/Diagnostic Tests: Dg Chest 2 View  Result Date: 09/11/2017 CLINICAL DATA:  Intermittent shortness of breath, rhonchi. Hand edema. History  of CHF, COPD. EXAM: CHEST  2 VIEW COMPARISON:  None. FINDINGS: Cardiomediastinal silhouette is normal. Calcified aortic arch. No pleural effusions or focal consolidations. Mild bronchitic changes. Trachea projects midline and there is no pneumothorax. Soft tissue planes and included osseous structures are non-suspicious. Old LEFT mid clavicle fracture. IMPRESSION: Mild bronchitic changes without focal consolidation. Electronically Signed   By: Elon Alas M.D.   On: 09/11/2017 22:09   Ct Renal Stone Study  Result Date: 09/11/2017 CLINICAL DATA:  Left lower abdominal pain EXAM: CT ABDOMEN AND PELVIS WITHOUT CONTRAST TECHNIQUE: Multidetector CT imaging of the abdomen and pelvis was performed following the standard protocol without IV contrast. COMPARISON:  None. FINDINGS: Lower chest: Lung bases demonstrate no acute consolidation or effusion. Normal heart size. Small hiatal hernia. Hepatobiliary: Lobulated liver contour. No focal hepatic abnormality, calcified gallstone or biliary dilatation Pancreas: Unremarkable. No pancreatic ductal dilatation or surrounding inflammatory changes. Spleen: Normal  in size without focal abnormality. Adrenals/Urinary Tract: Adrenal glands are within normal limits. Markedly atrophic right kidney without hydronephrosis. Punctate stone in the upper pole of the right kidney. Moderate-to-marked left perinephric edema and fat stranding. 2 cm low-density lesion mid left kidney. No significant left hydronephrosis. Slightly enlarged mid to distal left ureter. No stones along the course of the left ureter. Slightly thick-walled appearance of the bladder Stomach/Bowel: Stomach is within normal limits. Appendix appears normal. No evidence of bowel wall thickening, distention, or inflammatory changes. Vascular/Lymphatic: Aortic atherosclerosis. Mild retroperitoneal adenopathy with left periaortic lymph node measuring up to 9 mm. Reproductive: Slightly enlarged prostate Other: Negative for  free air or free fluid. Within the bilateral inguinal canals Musculoskeletal: No acute or suspicious bone lesion IMPRESSION: 1. Moderate-to-marked left perinephric edema and fat stranding. No significant left hydronephrosis, slight enlargement of the mid to distal left ureter however no definitive ureteral stone is visualized. Differential considerations include recently passed stone ; could also consider pyelonephritis. 2. Thick-walled appearance of the bladder suggesting cystitis or chronic obstruction 3. Very atrophic right kidney without hydronephrosis. Punctate stone in the upper pole of the right kidney 4. 2 cm hypodensity mid pole left kidney, possible cyst but further evaluation limited without contrast 5. Lobulated liver contour, query cirrhosis or hepatocellular disease Electronically Signed   By: Donavan Foil M.D.   On: 09/11/2017 16:55    Guadalupe Dawn, MD 09/17/2017, 8:51 AM PGY-1, [facility] Intern pager: [phone], text pages welcome

## 2017-09-17 NOTE — Progress Notes (Signed)
Physical Therapy Treatment and Discharge Note Patient Details Name: Kerry Underwood MRN: [id number] DOB: [birth year] Today's Date: 09/17/2017    History of Present Illness 66 year old male who presented on 10/3 with 1 day history of n/v, abdominal pain, back pain, foul smelling urine. CT scan showed atropic right kidney and perinephric stranding around left kidney consistent with pyelonephritis.  Creatinine continued to increase and patient refused dialysis.      PT Comments    Notable improvement in gait and balance today; Much smoother gait; Acute PT goals met; will dc acute PT; Pt described concern for weak ankles effecting balance; gave him tips and standing exercise for ankle strengthening   Follow Up Recommendations  No PT follow up (pt has no means for f/u PT)     Equipment Recommendations       Recommendations for Other Services       Precautions / Restrictions Precautions Precautions: Fall    Mobility  Bed Mobility Overal bed mobility: Independent                Transfers Overall transfer level: Independent Equipment used: None                Ambulation/Gait Ambulation/Gait assistance: Modified independent (Device/Increase time) Ambulation Distance (Feet): 300 Feet Assistive device: None Gait Pattern/deviations: Step-through pattern     General Gait Details: Slow gait, but smoother and with more time in single limb stance; notable improvements   Stairs            Wheelchair Mobility    Modified Rankin (Stroke Patients Only)       Balance     Sitting balance-Leahy Scale: Normal       Standing balance-Leahy Scale: Good   Single Leg Stance - Right Leg: 3 Single Leg Stance - Left Leg: 3             Standardized Balance Assessment Standardized Balance Assessment : Dynamic Gait Index   Dynamic Gait Index Level Surface: Normal Change in Gait Speed: Mild Impairment Gait with Horizontal Head Turns: Mild Impairment Gait with  Vertical Head Turns: Mild Impairment Gait and Pivot Turn: Normal Step Over Obstacle: Mild Impairment Step Around Obstacles: Normal Steps: Normal Total Score: 20      Cognition Arousal/Alertness: Awake/alert Behavior During Therapy: WFL for tasks assessed/performed Overall Cognitive Status: Within Functional Limits for tasks assessed                                        Exercises Other Exercises Other Exercises: Instructed pt in ankle strengthening, standing single limb stance at stable surface for prn support; return demonstrated    General Comments        Pertinent Vitals/Pain Pain Assessment: No/denies pain    Home Living                      Prior Function            PT Goals (current goals can now be found in the care plan section) Acute Rehab PT Goals Patient Stated Goal: get back to the woods Progress towards PT goals: Goals met/education completed, patient discharged from PT    Frequency    Min 3X/week      PT Plan Current plan remains appropriate    Co-evaluation              AM-PAC  PT "6 Clicks" Daily Activity  Outcome Measure  Difficulty turning over in bed (including adjusting bedclothes, sheets and blankets)?: None Difficulty moving from lying on back to sitting on the side of the bed? : None Difficulty sitting down on and standing up from a chair with arms (e.g., wheelchair, bedside commode, etc,.)?: None Help needed moving to and from a bed to chair (including a wheelchair)?: None Help needed walking in hospital room?: None Help needed climbing 3-5 steps with a railing? : None 6 Click Score: 24    End of Session   Activity Tolerance: Patient tolerated treatment well Patient left: Other (comment) (managing independently in room) Nurse Communication: Mobility status PT Visit Diagnosis: Unsteadiness on feet (R26.81);Muscle weakness (generalized) (M62.81)     Time: 0230-1720 PT Time Calculation (min)  (ACUTE ONLY): 20 min  Charges:  $Gait Training: 8-22 mins                    G Codes:       Roney Marion, PT  Acute Rehabilitation Services Pager [phone] Office [place] 09/17/2017, 9:02 AM

## 2020-04-15 ENCOUNTER — Other Ambulatory Visit: Payer: Self-pay

## 2020-04-15 ENCOUNTER — Ambulatory Visit: Payer: Medicare Other | Admitting: Family Medicine

## 2020-04-15 ENCOUNTER — Inpatient Hospital Stay (HOSPITAL_COMMUNITY): Payer: Medicare Other

## 2020-04-15 ENCOUNTER — Inpatient Hospital Stay (HOSPITAL_COMMUNITY)
Admission: EM | Admit: 2020-04-15 | Discharge: 2020-04-20 | DRG: 674 | Disposition: A | Discharge status: Home / Self Care | Payer: Medicare Other | Attending: Internal Medicine | Admitting: Internal Medicine

## 2020-04-15 ENCOUNTER — Emergency Department (HOSPITAL_COMMUNITY): Payer: Medicare Other

## 2020-04-15 ENCOUNTER — Encounter (HOSPITAL_COMMUNITY): Payer: Self-pay | Admitting: *Deleted

## 2020-04-15 DIAGNOSIS — N401 Enlarged prostate with lower urinary tract symptoms: Secondary | ICD-10-CM | POA: Diagnosis present

## 2020-04-15 DIAGNOSIS — R531 Weakness: Secondary | ICD-10-CM | POA: Diagnosis present

## 2020-04-15 DIAGNOSIS — R42 Dizziness and giddiness: Secondary | ICD-10-CM | POA: Diagnosis present

## 2020-04-15 DIAGNOSIS — E872 Acidosis: Secondary | ICD-10-CM | POA: Diagnosis present

## 2020-04-15 DIAGNOSIS — E875 Hyperkalemia: Secondary | ICD-10-CM | POA: Diagnosis present

## 2020-04-15 DIAGNOSIS — Z992 Dependence on renal dialysis: Secondary | ICD-10-CM

## 2020-04-15 DIAGNOSIS — Z419 Encounter for procedure for purposes other than remedying health state, unspecified: Secondary | ICD-10-CM

## 2020-04-15 DIAGNOSIS — R351 Nocturia: Secondary | ICD-10-CM | POA: Diagnosis present

## 2020-04-15 DIAGNOSIS — F17210 Nicotine dependence, cigarettes, uncomplicated: Secondary | ICD-10-CM | POA: Diagnosis present

## 2020-04-15 DIAGNOSIS — I151 Hypertension secondary to other renal disorders: Secondary | ICD-10-CM

## 2020-04-15 DIAGNOSIS — Z7951 Long term (current) use of inhaled steroids: Secondary | ICD-10-CM | POA: Diagnosis not present

## 2020-04-15 DIAGNOSIS — N186 End stage renal disease: Secondary | ICD-10-CM | POA: Diagnosis present

## 2020-04-15 DIAGNOSIS — N185 Chronic kidney disease, stage 5: Secondary | ICD-10-CM

## 2020-04-15 DIAGNOSIS — Z792 Long term (current) use of antibiotics: Secondary | ICD-10-CM

## 2020-04-15 DIAGNOSIS — Z9114 Patient's other noncompliance with medication regimen: Secondary | ICD-10-CM

## 2020-04-15 DIAGNOSIS — I509 Heart failure, unspecified: Secondary | ICD-10-CM | POA: Diagnosis present

## 2020-04-15 DIAGNOSIS — N189 Chronic kidney disease, unspecified: Secondary | ICD-10-CM | POA: Diagnosis not present

## 2020-04-15 DIAGNOSIS — L299 Pruritus, unspecified: Secondary | ICD-10-CM | POA: Diagnosis present

## 2020-04-15 DIAGNOSIS — Z20822 Contact with and (suspected) exposure to covid-19: Secondary | ICD-10-CM | POA: Diagnosis present

## 2020-04-15 DIAGNOSIS — Z7982 Long term (current) use of aspirin: Secondary | ICD-10-CM

## 2020-04-15 DIAGNOSIS — Z79899 Other long term (current) drug therapy: Secondary | ICD-10-CM

## 2020-04-15 DIAGNOSIS — J449 Chronic obstructive pulmonary disease, unspecified: Secondary | ICD-10-CM | POA: Diagnosis present

## 2020-04-15 DIAGNOSIS — D631 Anemia in chronic kidney disease: Secondary | ICD-10-CM

## 2020-04-15 DIAGNOSIS — R55 Syncope and collapse: Secondary | ICD-10-CM | POA: Diagnosis present

## 2020-04-15 DIAGNOSIS — I132 Hypertensive heart and chronic kidney disease with heart failure and with stage 5 chronic kidney disease, or end stage renal disease: Secondary | ICD-10-CM | POA: Diagnosis present

## 2020-04-15 DIAGNOSIS — Z66 Do not resuscitate: Secondary | ICD-10-CM | POA: Diagnosis present

## 2020-04-15 DIAGNOSIS — N179 Acute kidney failure, unspecified: Secondary | ICD-10-CM

## 2020-04-15 DIAGNOSIS — E869 Volume depletion, unspecified: Secondary | ICD-10-CM | POA: Diagnosis present

## 2020-04-15 DIAGNOSIS — N2581 Secondary hyperparathyroidism of renal origin: Secondary | ICD-10-CM | POA: Diagnosis present

## 2020-04-15 DIAGNOSIS — N2889 Other specified disorders of kidney and ureter: Secondary | ICD-10-CM

## 2020-04-15 DIAGNOSIS — R338 Other retention of urine: Secondary | ICD-10-CM

## 2020-04-15 DIAGNOSIS — N170 Acute kidney failure with tubular necrosis: Principal | ICD-10-CM | POA: Diagnosis present

## 2020-04-15 DIAGNOSIS — Z5320 Procedure and treatment not carried out because of patient's decision for unspecified reasons: Secondary | ICD-10-CM | POA: Diagnosis not present

## 2020-04-15 LAB — CBC WITH DIFFERENTIAL/PLATELET
Abs Immature Granulocytes: 0.02 10*3/uL (ref 0.00–0.07)
Basophils Absolute: 0 10*3/uL (ref 0.0–0.1)
Basophils Relative: 0 %
Eosinophils Absolute: 1.6 10*3/uL — ABNORMAL HIGH (ref 0.0–0.5)
Eosinophils Relative: 28 %
HCT: 25.1 % — ABNORMAL LOW (ref 39.0–52.0)
Hemoglobin: 7.6 g/dL — ABNORMAL LOW (ref 13.0–17.0)
Immature Granulocytes: 0 %
Lymphocytes Relative: 13 %
Lymphs Abs: 0.8 10*3/uL (ref 0.7–4.0)
MCH: 31.1 pg (ref 26.0–34.0)
MCHC: 30.3 g/dL (ref 30.0–36.0)
MCV: 102.9 fL — ABNORMAL HIGH (ref 80.0–100.0)
Monocytes Absolute: 0.4 10*3/uL (ref 0.1–1.0)
Monocytes Relative: 7 %
Neutro Abs: 2.9 10*3/uL (ref 1.7–7.7)
Neutrophils Relative %: 52 %
Platelets: 267 10*3/uL (ref 150–400)
RBC: 2.44 MIL/uL — ABNORMAL LOW (ref 4.22–5.81)
RDW: 15.3 % (ref 11.5–15.5)
WBC: 5.7 10*3/uL (ref 4.0–10.5)
nRBC: 0 % (ref 0.0–0.2)

## 2020-04-15 LAB — URINALYSIS, COMPLETE (UACMP) WITH MICROSCOPIC
Bilirubin Urine: NEGATIVE
Glucose, UA: NEGATIVE mg/dL
Ketones, ur: NEGATIVE mg/dL
Nitrite: POSITIVE — AB
Protein, ur: 100 mg/dL — AB
Specific Gravity, Urine: 1.01 (ref 1.005–1.030)
WBC, UA: >50 WBC/hpf — ABNORMAL HIGH (ref 0–5)
pH: 6 (ref 5.0–8.0)

## 2020-04-15 LAB — COMPREHENSIVE METABOLIC PANEL
ALT: 17 U/L (ref 0–44)
AST: 25 U/L (ref 15–41)
Albumin: 3.7 g/dL (ref 3.5–5.0)
Alkaline Phosphatase: 105 U/L (ref 38–126)
Anion gap: 15 (ref 5–15)
BUN: 86 mg/dL — ABNORMAL HIGH (ref 8–23)
CO2: 16 mmol/L — ABNORMAL LOW (ref 22–32)
Calcium: 6.4 mg/dL — CL (ref 8.9–10.3)
Chloride: 112 mmol/L — ABNORMAL HIGH (ref 98–111)
Creatinine, Ser: 10.08 mg/dL — ABNORMAL HIGH (ref 0.61–1.24)
GFR calc Af Amer: 5 mL/min — ABNORMAL LOW (ref >60)
GFR calc non Af Amer: 5 mL/min — ABNORMAL LOW (ref >60)
Glucose, Bld: 103 mg/dL — ABNORMAL HIGH (ref 70–99)
Potassium: 5.7 mmol/L — ABNORMAL HIGH (ref 3.5–5.1)
Sodium: 143 mmol/L (ref 135–145)
Total Bilirubin: 0.5 mg/dL (ref 0.3–1.2)
Total Protein: 7.5 g/dL (ref 6.5–8.1)

## 2020-04-15 LAB — RESPIRATORY PANEL BY RT PCR (FLU A&B, COVID)
Influenza A by PCR: NEGATIVE
Influenza B by PCR: NEGATIVE
SARS Coronavirus 2 by RT PCR: NEGATIVE

## 2020-04-15 LAB — ABO/RH: ABO/RH(D): A POS

## 2020-04-15 LAB — PROTEIN / CREATININE RATIO, URINE
Creatinine, Urine: 81.75 mg/dL
Protein Creatinine Ratio: 2.19 mg/mg{Cre} — ABNORMAL HIGH (ref 0.00–0.15)
Total Protein, Urine: 179 mg/dL

## 2020-04-15 LAB — SODIUM, URINE, RANDOM: Sodium, Ur: 79 mmol/L

## 2020-04-15 LAB — CREATININE, URINE, RANDOM: Creatinine, Urine: 81.34 mg/dL

## 2020-04-15 MED ORDER — HEPARIN SODIUM (PORCINE) 5000 UNIT/ML IJ SOLN
5000.0000 [IU] | Freq: Three times a day (TID) | INTRAMUSCULAR | Status: DC
  Administered 2020-04-15 – 2020-04-20 (×13): 5000 [IU] via SUBCUTANEOUS
  Filled 2020-04-15 (×12): qty 1

## 2020-04-15 MED ORDER — UMECLIDINIUM BROMIDE 62.5 MCG/INH IN AEPB
1.0000 | INHALATION_SPRAY | Freq: Every day | RESPIRATORY_TRACT | Status: DC
  Filled 2020-04-15: qty 7

## 2020-04-15 MED ORDER — CALCIUM CARBONATE-VITAMIN D 500-200 MG-UNIT PO TABS
4.0000 | ORAL_TABLET | Freq: Once | ORAL | Status: AC
  Administered 2020-04-15: 4 via ORAL
  Filled 2020-04-15: qty 4

## 2020-04-15 MED ORDER — MECLIZINE HCL 12.5 MG PO TABS
25.0000 mg | ORAL_TABLET | Freq: Once | ORAL | Status: AC
  Administered 2020-04-15: 25 mg via ORAL
  Filled 2020-04-15: qty 2

## 2020-04-15 MED ORDER — AMLODIPINE BESYLATE 10 MG PO TABS
10.0000 mg | ORAL_TABLET | Freq: Every day | ORAL | Status: DC
  Administered 2020-04-15 – 2020-04-16 (×2): 10 mg via ORAL
  Filled 2020-04-15 (×2): qty 1

## 2020-04-15 MED ORDER — METOPROLOL TARTRATE 25 MG PO TABS
25.0000 mg | ORAL_TABLET | Freq: Two times a day (BID) | ORAL | Status: DC
  Administered 2020-04-15 – 2020-04-20 (×10): 25 mg via ORAL
  Filled 2020-04-15 (×10): qty 1

## 2020-04-15 MED ORDER — ONDANSETRON HCL 4 MG/2ML IJ SOLN: 4.0000 mg | Freq: Four times a day (QID) | INTRAMUSCULAR | Status: DC | PRN

## 2020-04-15 MED ORDER — ALBUTEROL SULFATE (2.5 MG/3ML) 0.083% IN NEBU: 2.5000 mg | INHALATION_SOLUTION | Freq: Four times a day (QID) | RESPIRATORY_TRACT | Status: DC | PRN

## 2020-04-15 MED ORDER — ONDANSETRON HCL 4 MG/2ML IJ SOLN
4.0000 mg | Freq: Once | INTRAMUSCULAR | Status: AC
  Administered 2020-04-15: 12:00:00 4 mg via INTRAVENOUS
  Filled 2020-04-15: qty 2

## 2020-04-15 MED ORDER — SODIUM ZIRCONIUM CYCLOSILICATE 5 G PO PACK
5.0000 g | PACK | Freq: Once | ORAL | Status: AC
  Administered 2020-04-15: 5 g via ORAL
  Filled 2020-04-15: qty 1

## 2020-04-15 MED ORDER — TAMSULOSIN HCL 0.4 MG PO CAPS
0.4000 mg | ORAL_CAPSULE | Freq: Every day | ORAL | Status: DC
  Administered 2020-04-15 – 2020-04-20 (×6): 0.4 mg via ORAL
  Filled 2020-04-15 (×6): qty 1

## 2020-04-15 MED ORDER — CALCIUM ACETATE (PHOS BINDER) 667 MG PO CAPS
1334.0000 mg | ORAL_CAPSULE | Freq: Three times a day (TID) | ORAL | Status: DC
  Administered 2020-04-16 – 2020-04-20 (×11): 1334 mg via ORAL
  Filled 2020-04-15 (×11): qty 2

## 2020-04-15 MED ORDER — SODIUM CHLORIDE 0.9% IV SOLUTION: Freq: Once | INTRAVENOUS | Status: AC

## 2020-04-15 MED ORDER — SODIUM CHLORIDE 0.9 % IV BOLUS
1000.0000 mL | Freq: Once | INTRAVENOUS | Status: AC
  Administered 2020-04-15: 1000 mL via INTRAVENOUS

## 2020-04-15 MED ORDER — ONDANSETRON HCL 4 MG PO TABS: 4.0000 mg | ORAL_TABLET | Freq: Four times a day (QID) | ORAL | Status: DC | PRN

## 2020-04-15 MED ORDER — SODIUM CHLORIDE 0.9 % IV SOLN: INTRAVENOUS | Status: DC

## 2020-04-15 MED ORDER — POLYETHYLENE GLYCOL 3350 17 G PO PACK: 17.0000 g | PACK | Freq: Every day | ORAL | Status: DC | PRN

## 2020-04-15 NOTE — ED Triage Notes (Signed)
Pt states he has been without his meds x one year due to not being able to go to the doctor; pt states yesterday he had 2 near syncopal episodes where he was driving and had to pull over; pt states he vomited before the episodes occurred; pt denies any pain; pt states he has noticed the dizziness getting worse over the last month; pt states he feels like his legs are not connected to his body, like they have a mind of their own

## 2020-04-15 NOTE — Consult Note (Signed)
Reason for Consult: AKI/CKD stage 5 Referring Physician: Nehemiah Settle, DO  Kerry Underwood is an 69 y.o. 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  He also reports a near syncopal event associated with N/V yesterday.  He also has been having difficulty walking, feeling "like my feet don't belong to me".  He admits that he has not had any medications for the past year nor has he seen a physician in over a year.  In the ED he was noted to have an Hgb of 7.6, potassium of 5.7, BUN/Cr of 86/10.8, and Ca 6.4.  He was admitted for further evaluation and transferred to [facility] for possible hemodialysis.  He also reports urinary hesitancy, retention, and nocturia x 6.  Of note, he had been seen by our practice in October 2018 when he presented with prostatitis.  His Scr rose to 5.15 with an eGFR of 10 ml/min.  He declined AVF creation or initiation of dialysis at that time and did not follow up with our office.  The trend in Scr is seen below.  Trend in Creatinine: Creatinine, Ser  Date/Time Value Ref Range Status  04/15/2020 12:46 PM 10.08 (H) 0.61 - 1.24 mg/dL Final  09/17/2017 03:44 AM 5.15 (H) 0.61 - 1.24 mg/dL Final  09/16/2017 03:48 AM 4.81 (H) 0.61 - 1.24 mg/dL Final  09/15/2017 02:51 AM 4.67 (H) 0.61 - 1.24 mg/dL Final  09/14/2017 03:17 AM 4.90 (H) 0.61 - 1.24 mg/dL Final  09/13/2017 03:54 AM 4.75 (H) 0.61 - 1.24 mg/dL Final  09/12/2017 02:18 AM 4.02 (H) 0.61 - 1.24 mg/dL Final  09/11/2017 11:11 AM 3.64 (H) 0.61 - 1.24 mg/dL Final    PMH:   Past Medical History:  Diagnosis Date  . CHF (congestive heart failure) ([place])   . COPD (chronic obstructive pulmonary disease) ([place])   . Hypertension   . Renal disorder     PSH:  History reviewed. No pertinent surgical history.  Allergies:   Allergies  Allergen Reactions  . Strawberry (Diagnostic)     Medications:   Prior to Admission medications   Medication Sig Start Date End Date Taking? Authorizing Provider  amLODipine (NORVASC) 10 MG tablet Take 1 tablet (10 mg total) by mouth daily. 09/18/17  Yes Mercy Riding, MD  aspirin EC 325 MG tablet Take 325 mg by mouth daily.    Yes [provider]  hydrochlorothiazide (HYDRODIURIL) 25 MG tablet Take 25 mg by mouth daily.   Yes [provider]  metoprolol tartrate (LOPRESSOR) 25 MG tablet Take 1 tablet (25 mg total) by mouth 2 (two) times daily. 09/17/17  Yes Mercy Riding, MD  tamsulosin (FLOMAX) 0.4 MG CAPS capsule Take 1 capsule (0.4 mg total) by mouth daily. 09/18/17  Yes Mercy Riding, MD  tiotropium (SPIRIVA HANDIHALER) 18 MCG inhalation capsule Place 1 capsule (18 mcg total) into inhaler and inhale daily. 09/17/17 04/15/20 Yes Mercy Riding, MD  albuterol (PROVENTIL HFA;VENTOLIN HFA) 108 (90 Base) MCG/ACT inhaler Inhale 2 puffs into the lungs every 6 (six) hours as needed for wheezing or shortness of breath. Patient not taking: Reported on 04/15/2020 09/17/17   Mercy Riding, MD  ciprofloxacin (CIPRO) 500 MG tablet Take 1 tablet (500 mg total) by mouth daily. Patient not taking: Reported on 04/15/2020 09/17/17   Wendee Beavers  T, MD  Multiple Vitamin (MULTIVITAMIN WITH MINERALS) TABS tablet Take 1 tablet by mouth daily. Patient not taking: Reported on 04/15/2020 09/18/17   Mercy Riding, MD  ondansetron (ZOFRAN) 4 MG tablet Take 1 tablet (4 mg total) by mouth every 6 (six) hours as needed for nausea or vomiting. Patient not taking: Reported on 04/15/2020 09/11/17   Pisciotta, Elmyra Ricks, PA-C    Inpatient medications: . sodium chloride   Intravenous Once  . amLODipine  10 mg Oral Daily  . [START ON 04/16/2020] calcium acetate  1,334 mg Oral TID WC  . heparin  5,000 Units Subcutaneous Q8H  . metoprolol tartrate  25 mg Oral BID  . tamsulosin  0.4 mg Oral Daily  .  umeclidinium bromide  1 puff Inhalation Daily    Discontinued Meds:  There are no discontinued medications.  Social History:  reports that he has been smoking. He has been smoking about 0.50 packs per day. He has never used smokeless tobacco. He reports that he does not drink alcohol or use drugs.  Family History:  History reviewed. No pertinent family history.  A comprehensive review of systems was negative except for: Constitutional: positive for fatigue and malaise Cardiovascular: positive for dizziness and pre-syncope Gastrointestinal: positive for nausea and vomiting Genitourinary: positive for hesitancy, nocturia and retention Neurological: positive for coordination problems, dizziness and weakness Weight change:   Intake/Output Summary (Last 24 hours) at 04/15/2020 2012 Last data filed at 04/15/2020 1830 Gross per 24 hour  Intake 240 ml  Output 0 ml  Net 240 ml   BP (!) 165/87 (BP Location: Left Arm)   Pulse 87   Temp 97.8 F (36.6 C) (Oral)   Resp 18   Ht _0  (1.651 m)   Wt 77.3 kg   SpO2 100%   BMI 28.36 kg/m  Vitals:   04/15/20 1615 04/15/20 1659 04/15/20 1830 04/15/20 1836  BP:  (!) 172/87  (!) 165/87  Pulse: 78 79  87  Resp: _1 Temp:  98.1 F (36.7 C)  97.8 F (36.6 C)  TempSrc:  Oral  Oral  SpO2: 100% 100%  100%  Weight:   77.3 kg   Height:   _2  (1.651 m)      General appearance: alert, cooperative and no distress Head: Normocephalic, without obvious abnormality, atraumatic Resp: clear to auscultation bilaterally Cardio: regular rate and rhythm GI: soft, non-tender; bowel sounds normal; no masses,  no organomegaly and some suprapubic fullness Extremities: extremities normal, atraumatic, no cyanosis or edema Neuro:  no asterixis  Labs: Basic Metabolic Panel: Recent Labs  Lab 04/15/20 1246  NA 143  K 5.7*  CL 112*  CO2 16*  GLUCOSE 103*  BUN 86*  CREATININE 10.08*  ALBUMIN 3.7  CALCIUM 6.4*   Liver Function Tests: Recent Labs   Lab 04/15/20 1246  AST 25  ALT 17  ALKPHOS 105  BILITOT 0.5  PROT 7.5  ALBUMIN 3.7   No results for input(s): LIPASE, AMYLASE in the last 168 hours. No results for input(s): AMMONIA in the last 168 hours. CBC: Recent Labs  Lab 04/15/20 1246  WBC 5.7  NEUTROABS 2.9  HGB 7.6*  HCT 25.1*  MCV 102.9*  PLT 267   PT/INR: _3 (inr:5) Cardiac Enzymes: )No results for input(s): CKTOTAL, CKMB, CKMBINDEX, TROPONINI in the last 168 hours. CBG: No results for input(s): GLUCAP in the last 168 hours.  Iron Studies: No results for input(s): IRON, TIBC, TRANSFERRIN, FERRITIN in the last 168  hours.  Xrays/Other Studies: CT Head Wo Contrast  Result Date: 04/15/2020 CLINICAL DATA:  Ataxia with near syncopal episodes EXAM: CT HEAD WITHOUT CONTRAST TECHNIQUE: Contiguous axial images were obtained from the base of the skull through the vertex without intravenous contrast. COMPARISON:  None. FINDINGS: Brain: There is mild diffuse atrophy. There is no intracranial mass, hemorrhage, extra-axial fluid collection, or midline shift. There is patchy small vessel disease in the centra semiovale bilaterally. 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 No acute infarct is demonstrable on this study. Vascular: There is no hyperdense vessel. There is calcification in each distal vertebral artery and carotid siphon region. Skull: The bony calvarium appears intact. Sinuses/Orbits: There is mucosal thickening in several ethmoid air cells. Other visualized paranasal sinuses are clear. Orbits appear symmetric bilaterally. Other: Mastoid air cells are clear. IMPRESSION: Mild atrophy with periventricular small vessel disease. 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 No acute infarct evident. No mass or hemorrhage. There are foci of arterial vascular calcification. There is mucosal thickening in several ethmoid air cells. Electronically Signed   By: Lowella Grip III M.D.    On: 04/15/2020 12:23     Assessment/Plan: 1.  AKI vs. Progressive CKD stage V- mild uremic symptoms.  Agree with starting IVF's and follow UOP and Scr.  Will need vascular access placement with AVF, likely will also require a TDC if his renal function does not significantly improve.   1. 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 We discussed different modalities of RRT including HD, and PD.  He does not have anyone at home for HHD.  He would be amenable to HD if needed. 2. Hypocalcemia- presumably due to secondary HPTH.  Will check vit D levels and iPTH.  Given oral calcium gluconate and started on phoslo for hypercalcemia. 3. Hyperkalemia- given lokelma and will also start bicarb.   4. Anemia of CKD- will check iron stores, and will start ESA.  To receive PRBC's.   5. HTN- poorly controlled due to noncompliance with medications.  To resume previous outpatient medications, amlodipine and metoprolol. 6. Pre-syncope/dizziness- possibly related to significant anemia.   7. Metabolic acidosis- start oral bicarb 8. Secondary HPTH- check vit D and iPTH, phos in am.  Will start calcitriol due to hypocalcemia. 9. BPH- with nocturia x 6.  Will check bladder scan and agree with restarting flomax. 10. dCHF- appears volume depleted on exam.  11. COPD- stable.  12. Tobacco abuse- ongoing.   Governor Rooks Yaritsa Savarino 04/15/2020, 8:12 PM

## 2020-04-15 NOTE — ED Notes (Signed)
Patient transport by [facility]  All belongings with patient.

## 2020-04-15 NOTE — H&P (Signed)
History and Physical  Kerry Underwood KGU:[id number] DOB: Jan 01, 1951 DOA: 04/15/2020  Referring physician: Dr Dewayne Hatch, ED physician PCP: Patient, No Pcp Per  Outpatient Specialists:   Patient Coming From: home  Chief Complaint: syncopal episode  HPI: Kerry Underwood is a 69 y.o. male with a history of chronic kidney disease, COPD, CHF, hypertension.  Patient has not seen a physician over the past year and has not been taking his medications for the past year.  Over the past couple weeks, he has been increasingly tired, fatigued, with shortness of breath with activity and feeling like he is going to pass out.  Today, he did pass out and was brought to the hospital by family member.  His symptoms have been worsening.  No palliating or provoking factors.  Emergency Department Course: Potassium 5.7, creatinine 10.08, calcium 6.4, LFTs normal, hemoglobin 7.6  Review of Systems:   Pt denies any fevers, chills, nausea, vomiting, diarrhea, constipation, abdominal pain, shortness of breath, dyspnea on exertion, orthopnea, cough, wheezing, palpitations, headache, vision changes, melena, rectal bleeding.  Review of systems are otherwise negative  Past Medical History:  Diagnosis Date  . CHF (congestive heart failure) ([place])   . COPD (chronic obstructive pulmonary disease) ([place])   . Hypertension   . Renal disorder    History reviewed. No pertinent surgical history. Social History:  reports that he has been smoking. He has been smoking about 0.50 packs per day. He has never used smokeless tobacco. He reports that he does not drink alcohol or use drugs. Patient lives at home  Allergies  Allergen Reactions  . Strawberry (Diagnostic)     History reviewed. No pertinent family history.    Prior to Admission medications   Medication Sig Start Date End Date Taking? Authorizing Provider  amLODipine (NORVASC) 10 MG tablet Take 1 tablet (10 mg total) by mouth daily. 09/18/17  Yes Mercy Riding, MD  aspirin  EC 325 MG tablet Take 325 mg by mouth daily.    Yes [provider]  hydrochlorothiazide (HYDRODIURIL) 25 MG tablet Take 25 mg by mouth daily.   Yes [provider]  metoprolol tartrate (LOPRESSOR) 25 MG tablet Take 1 tablet (25 mg total) by mouth 2 (two) times daily. 09/17/17  Yes Mercy Riding, MD  tamsulosin (FLOMAX) 0.4 MG CAPS capsule Take 1 capsule (0.4 mg total) by mouth daily. 09/18/17  Yes Mercy Riding, MD  tiotropium (SPIRIVA HANDIHALER) 18 MCG inhalation capsule Place 1 capsule (18 mcg total) into inhaler and inhale daily. 09/17/17 04/15/20 Yes Mercy Riding, MD  albuterol (PROVENTIL HFA;VENTOLIN HFA) 108 (90 Base) MCG/ACT inhaler Inhale 2 puffs into the lungs every 6 (six) hours as needed for wheezing or shortness of breath. Patient not taking: Reported on 04/15/2020 09/17/17   Mercy Riding, MD  ciprofloxacin (CIPRO) 500 MG tablet Take 1 tablet (500 mg total) by mouth daily. Patient not taking: Reported on 04/15/2020 09/17/17   Mercy Riding, MD  Multiple Vitamin (MULTIVITAMIN WITH MINERALS) TABS tablet Take 1 tablet by mouth daily. Patient not taking: Reported on 04/15/2020 09/18/17   Mercy Riding, MD  ondansetron (ZOFRAN) 4 MG tablet Take 1 tablet (4 mg total) by mouth every 6 (six) hours as needed for nausea or vomiting. Patient not taking: Reported on 04/15/2020 09/11/17   Pisciotta, Elmyra Ricks, PA-C    Physical Exam: BP (!) 146/71   Pulse 78   Temp 97.9 F (36.6 C) (Oral)   Resp 13   Ht 5\' 5"  (1.651  m)   Wt 75.3 kg   SpO2 100%   BMI 27.62 kg/m   . General: Elderly male. Awake and alert and oriented x3. No acute cardiopulmonary distress.  Marland Kitchen HEENT: Normocephalic atraumatic.  Right and left ears normal in appearance.  Pupils equal, round, reactive to light. Extraocular muscles are intact. Sclerae anicteric and noninjected.  Moist mucosal membranes. No mucosal lesions.  . Neck: Neck supple without lymphadenopathy. No carotid bruits. No masses palpated.  . Cardiovascular:  Regular rate with normal S1-S2 sounds. No murmurs, rubs, gallops auscultated. No JVD.  Marland Kitchen Respiratory: Good respiratory effort with no wheezes, rales, rhonchi. Lungs clear to auscultation bilaterally.  No accessory muscle use. . Abdomen: Soft, nontender, nondistended. Active bowel sounds. No masses or hepatosplenomegaly  . Skin: No rashes, lesions, or ulcerations.  Dry, warm to touch. 2+ dorsalis pedis and radial pulses. . Musculoskeletal: No calf or leg pain. All major joints not erythematous nontender.  No upper or lower joint deformation.  Good ROM.  No contractures  . Psychiatric: Intact judgment and insight. Pleasant and cooperative. . Neurologic: No focal neurological deficits. Strength is 5/5 and symmetric in upper and lower extremities.  Cranial nerves II through XII are grossly intact.           Labs on Admission: I have personally reviewed following labs and imaging studies  CBC: Recent Labs  Lab 04/15/20 1246  WBC 5.7  NEUTROABS 2.9  HGB 7.6*  HCT 25.1*  MCV 102.9*  PLT 382   Basic Metabolic Panel: Recent Labs  Lab 04/15/20 1246  NA 143  K 5.7*  CL 112*  CO2 16*  GLUCOSE 103*  BUN 86*  CREATININE 10.08*  CALCIUM 6.4*   GFR: Estimated Creatinine Clearance: 6.5 mL/min (A) (by C-G formula based on SCr of 10.08 mg/dL (H)). Liver Function Tests: Recent Labs  Lab 04/15/20 1246  AST 25  ALT 17  ALKPHOS 105  BILITOT 0.5  PROT 7.5  ALBUMIN 3.7   No results for input(s): LIPASE, AMYLASE in the last 168 hours. No results for input(s): AMMONIA in the last 168 hours. Coagulation Profile: No results for input(s): INR, PROTIME in the last 168 hours. Cardiac Enzymes: No results for input(s): CKTOTAL, CKMB, CKMBINDEX, TROPONINI in the last 168 hours. BNP (last 3 results) No results for input(s): PROBNP in the last 8760 hours. HbA1C: No results for input(s): HGBA1C in the last 72 hours. CBG: No results for input(s): GLUCAP in the last 168 hours. Lipid  Profile: No results for input(s): CHOL, HDL, LDLCALC, TRIG, CHOLHDL, LDLDIRECT in the last 72 hours. Thyroid Function Tests: No results for input(s): TSH, T4TOTAL, FREET4, T3FREE, THYROIDAB in the last 72 hours. Anemia Panel: No results for input(s): VITAMINB12, FOLATE, FERRITIN, TIBC, IRON, RETICCTPCT in the last 72 hours. Urine analysis:    Component Value Date/Time   COLORURINE YELLOW 09/11/2017 1155   APPEARANCEUR CLOUDY (A) 09/11/2017 1155   LABSPEC 1.011 09/11/2017 1155   PHURINE 5.0 09/11/2017 1155   GLUCOSEU NEGATIVE 09/11/2017 1155   HGBUR SMALL (A) 09/11/2017 1155   BILIRUBINUR NEGATIVE 09/11/2017 1155   KETONESUR NEGATIVE 09/11/2017 1155   PROTEINUR >=300 (A) 09/11/2017 1155   NITRITE POSITIVE (A) 09/11/2017 1155   LEUKOCYTESUR LARGE (A) 09/11/2017 1155   Sepsis Labs: @LABRCNTIP (procalcitonin:4,lacticidven:4) ) Recent Results (from the past 240 hour(s))  Respiratory Panel by RT PCR (Flu A&B, Covid) - Nasopharyngeal Swab     Status: None   Collection Time: 04/15/20  2:54 PM   Specimen: Nasopharyngeal Swab  Result Value Ref Range Status   SARS Coronavirus 2 by RT PCR NEGATIVE NEGATIVE Final    Comment: (NOTE) SARS-CoV-2 target nucleic acids are NOT DETECTED. The SARS-CoV-2 RNA is generally detectable in upper respiratoy specimens during the acute phase of infection. The lowest concentration of SARS-CoV-2 viral copies this assay can detect is 131 copies/mL. A negative result does not preclude SARS-Cov-2 infection and should not be used as the sole basis for treatment or other patient management decisions. A negative result may occur with  improper specimen collection/handling, submission of specimen other than nasopharyngeal swab, presence of viral mutation(s) within the areas targeted by this assay, and inadequate number of viral copies (<131 copies/mL). A negative result must be combined with clinical observations, patient history, and epidemiological information.  The expected result is Negative. Fact Sheet for Patients:  PinkCheek.be Fact Sheet for Healthcare Providers:  GravelBags.it This test is not yet ap proved or cleared by the [place] FDA and  has been authorized for detection and/or diagnosis of SARS-CoV-2 by FDA under an Emergency Use Authorization (EUA). This EUA will remain  in effect (meaning this test can be used) for the duration of the COVID-19 declaration under Section 564(b)(1) of the Act, 21 U.S.C. section 360bbb-3(b)(1), unless the authorization is terminated or revoked sooner.    Influenza A by PCR NEGATIVE NEGATIVE Final   Influenza B by PCR NEGATIVE NEGATIVE Final    Comment: (NOTE) The Xpert Xpress SARS-CoV-2/FLU/RSV assay is intended as an aid in  the diagnosis of influenza from Nasopharyngeal swab specimens and  should not be used as a sole basis for treatment. Nasal washings and  aspirates are unacceptable for Xpert Xpress SARS-CoV-2/FLU/RSV  testing. Fact Sheet for Patients: PinkCheek.be Fact Sheet for Healthcare Providers: GravelBags.it This test is not yet approved or cleared by the [place] FDA and  has been authorized for detection and/or diagnosis of SARS-CoV-2 by  FDA under an Emergency Use Authorization (EUA). This EUA will remain  in effect (meaning this test can be used) for the duration of the  Covid-19 declaration under Section 564(b)(1) of the Act, 21  U.S.C. section 360bbb-3(b)(1), unless the authorization is  terminated or revoked. Performed at [facility], [address] [place], [place] [address]      Radiological Exams on Admission: CT Head Wo Contrast  Result Date: 04/15/2020 CLINICAL DATA:  Ataxia with near syncopal episodes EXAM: CT HEAD WITHOUT CONTRAST TECHNIQUE: Contiguous axial images were obtained from the base of the skull through the vertex without intravenous  contrast. COMPARISON:  None. FINDINGS: Brain: There is mild diffuse atrophy. There is no intracranial mass, hemorrhage, extra-axial fluid collection, or midline shift. There is patchy small vessel disease in the centra semiovale bilaterally. 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 No acute infarct is demonstrable on this study. Vascular: There is no hyperdense vessel. There is calcification in each distal vertebral artery and carotid siphon region. Skull: The bony calvarium appears intact. Sinuses/Orbits: There is mucosal thickening in several ethmoid air cells. Other visualized paranasal sinuses are clear. Orbits appear symmetric bilaterally. Other: Mastoid air cells are clear. IMPRESSION: Mild atrophy with periventricular small vessel disease. 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 No acute infarct evident. No mass or hemorrhage. There are foci of arterial vascular calcification. There is mucosal thickening in several ethmoid air cells. Electronically Signed   By: Lowella Grip III M.D.   On: 04/15/2020 12:23    Assessment/Plan: Principal Problem:  Acute on chronic renal failure (HCC) Active Problems:   Chronic obstructive pulmonary disease (HCC)   Hypertension secondary to other renal disorders   Benign prostatic hyperplasia with urinary retention   Anemia due to chronic kidney disease   Hyperkalemia   Hypocalcemia    This patient was discussed with the ED physician, including pertinent vitals, physical exam findings, labs, and imaging.  We also discussed care given by the ED provider.  1. Acute on chronic renal failure a. Admit to [facility] b. Patient makes urine c. Fluid hydrate d. Nephrology consulted e. will likely need dialysis f. Follow creatinine g. Left to see how his creatinine improves with hydration 2. Hypertension a. Restart antihypertensives: Amlodipine and metoprolol 3. Hyperkalemia a. Dose of Lokelma given b. Recheck  acid in the morning 4. Hypocalcemia a. Patient given calcium b. Start PhosLo with meals c. Recheck calcium in the morning 5. Anemia of chronic kidney disease a. Symptomatic anemia b. Transfused 2 units 6. BPH a. Restart Flomax 7. COPD a. Compensated  DVT prophylaxis: Heparin Consultants: Nephrology Code Status: DNR Family Communication: Daughter present Disposition Plan: Return home following admission   Truett Mainland, DO

## 2020-04-15 NOTE — ED Notes (Signed)
Date and time results received: 04/15/20 1350 (use smartphrase ".now" to insert current time)  Test: calcium Critical Value: 6.4  Name of Provider Notified: Dr. Roderic Palau  Orders Received? Or Actions Taken?: n/a

## 2020-04-15 NOTE — Progress Notes (Signed)
NEW ADMISSION NOTE New Admission Note:   Arrival Method: [facility] Mental Orientation: A&O X4 Telemetry: [id number] Assessment: Completed Skin: WDL IV: WDL Pain: Denies Safety Measures: Safety Fall Prevention Plan has been given, discussed and signed Admission: Completed [facility] Orientation: Patient has been orientated to the room, unit and staff.   Orders have been reviewed and implemented. Will continue to monitor the patient. Call light has been placed within reach and bed alarm has been activated.   Aneta Mins BSN, RN3

## 2020-04-15 NOTE — ED Provider Notes (Signed)
[facility] EMERGENCY DEPARTMENT Provider Note   CSN: [id number] Arrival date & time: 04/15/20  1032     History Chief Complaint  Patient presents with  . Dizziness  . Near Syncope    Juventino Pavone is a 69 y.o. male.  Patient complains of dizziness and weakness and head spinning for a few days  The history is provided by the patient. No language interpreter was used.  Dizziness Quality:  Lightheadedness Severity:  Moderate Onset quality:  Sudden Timing:  Constant Progression:  Worsening Chronicity:  Recurrent Context: bending over   Relieved by:  Nothing Worsened by:  Nothing Ineffective treatments:  None tried Associated symptoms: no chest pain, no diarrhea and no headaches   Near Syncope Pertinent negatives include no chest pain, no abdominal pain and no headaches.       Past Medical History:  Diagnosis Date  . CHF (congestive heart failure) ([place])   . COPD (chronic obstructive pulmonary disease) ([place])   . Hypertension   . Renal disorder     Patient Active Problem List   Diagnosis Date Noted  . Acute kidney injury superimposed on chronic kidney disease ([place]) 09/16/2017  . 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 Essential hypertension, malignant   . Benign prostatic hyperplasia with urinary retention   . Hypertensive urgency   . Pyelonephritis 09/11/2017  . Hypoxia   . Shortness of breath   . Urinary retention due to benign prostatic hyperplasia   . Chronic obstructive pulmonary disease ([place])   . Hypertension secondary to other renal disorders     History reviewed. No pertinent surgical history.     History reviewed. No pertinent family history.  Social History   Tobacco Use  . Smoking status: Current Every Day Smoker    Packs/day: 0.50  . Smokeless tobacco: Never Used  Substance Use Topics  . Alcohol use: No  . Drug use: No    Home Medications Prior to Admission medications   Medication Sig Start  Date End Date Taking? Authorizing Provider  amLODipine (NORVASC) 10 MG tablet Take 1 tablet (10 mg total) by mouth daily. 09/18/17  Yes Mercy Riding, MD  aspirin EC 325 MG tablet Take 325 mg by mouth daily.    Yes [provider]  hydrochlorothiazide (HYDRODIURIL) 25 MG tablet Take 25 mg by mouth daily.   Yes [provider]  metoprolol tartrate (LOPRESSOR) 25 MG tablet Take 1 tablet (25 mg total) by mouth 2 (two) times daily. 09/17/17  Yes Mercy Riding, MD  tamsulosin (FLOMAX) 0.4 MG CAPS capsule Take 1 capsule (0.4 mg total) by mouth daily. 09/18/17  Yes Mercy Riding, MD  tiotropium (SPIRIVA HANDIHALER) 18 MCG inhalation capsule Place 1 capsule (18 mcg total) into inhaler and inhale daily. 09/17/17 04/15/20 Yes Mercy Riding, MD  albuterol (PROVENTIL HFA;VENTOLIN HFA) 108 (90 Base) MCG/ACT inhaler Inhale 2 puffs into the lungs every 6 (six) hours as needed for wheezing or shortness of breath. Patient not taking: Reported on 04/15/2020 09/17/17   Mercy Riding, MD  ciprofloxacin (CIPRO) 500 MG tablet Take 1 tablet (500 mg total) by mouth daily. Patient not taking: Reported on 04/15/2020 09/17/17   Mercy Riding, MD  Multiple Vitamin (MULTIVITAMIN WITH MINERALS) TABS tablet Take 1 tablet by mouth daily. Patient not taking: Reported on 04/15/2020 09/18/17   Mercy Riding, MD  ondansetron (ZOFRAN) 4 MG tablet Take 1 tablet (4 mg total) by mouth  every 6 (six) hours as needed for nausea or vomiting. Patient not taking: Reported on 04/15/2020 09/11/17   Pisciotta, Elmyra Ricks, PA-C    Allergies    Strawberry (diagnostic)  Review of Systems   Review of Systems  Constitutional: Negative for appetite change and fatigue.  HENT: Negative for congestion, ear discharge and sinus pressure.   Eyes: Negative for discharge.  Respiratory: Negative for cough.   Cardiovascular: Positive for near-syncope. Negative for chest pain.  Gastrointestinal: Negative for abdominal pain and diarrhea.  Genitourinary:  Negative for frequency and hematuria.  Musculoskeletal: Negative for back pain.  Skin: Negative for rash.  Neurological: Positive for dizziness. Negative for seizures and headaches.  Psychiatric/Behavioral: Negative for hallucinations.    Physical Exam Updated Vital Signs BP (!) 163/82   Pulse 79   Temp 97.9 F (36.6 C) (Oral)   Resp 13   Ht 5\' 5"  (1.651 m)   Wt 75.3 kg   SpO2 100%   BMI 27.62 kg/m   Physical Exam Vitals and nursing note reviewed.  Constitutional:      Appearance: He is well-developed.  HENT:     Head: Normocephalic.     Nose: Nose normal.  Eyes:     General: No scleral icterus.    Conjunctiva/sclera: Conjunctivae normal.  Neck:     Thyroid: No thyromegaly.  Cardiovascular:     Rate and Rhythm: Normal rate and regular rhythm.     Heart sounds: No murmur. No friction rub. No gallop.   Pulmonary:     Breath sounds: No stridor. No wheezing or rales.  Chest:     Chest wall: No tenderness.  Abdominal:     General: There is no distension.     Tenderness: There is no abdominal tenderness. There is no rebound.  Musculoskeletal:        General: Normal range of motion.     Cervical back: Neck supple.  Lymphadenopathy:     Cervical: No cervical adenopathy.  Skin:    Findings: No erythema or rash.  Neurological:     Mental Status: He is alert and oriented to person, place, and time.     Motor: No abnormal muscle tone.     Coordination: Coordination normal.  Psychiatric:        Behavior: Behavior normal.     ED Results / Procedures / Treatments   Labs (all labs ordered are listed, but only abnormal results are displayed) Labs Reviewed  CBC WITH DIFFERENTIAL/PLATELET - Abnormal; Notable for the following components:      Result Value   RBC 2.44 (*)    Hemoglobin 7.6 (*)    HCT 25.1 (*)    MCV 102.9 (*)    Eosinophils Absolute 1.6 (*)    All other components within normal limits  COMPREHENSIVE METABOLIC PANEL - Abnormal; Notable for the  following components:   Potassium 5.7 (*)    Chloride 112 (*)    CO2 16 (*)    Glucose, Bld 103 (*)    BUN 86 (*)    Creatinine, Ser 10.08 (*)    Calcium 6.4 (*)    GFR calc non Af Amer 5 (*)    GFR calc Af Amer 5 (*)    All other components within normal limits  RESPIRATORY PANEL BY RT PCR (FLU A&B, COVID)  CBC WITH DIFFERENTIAL/PLATELET  URINALYSIS, ROUTINE W REFLEX MICROSCOPIC    EKG None  Radiology CT Head Wo Contrast  Result Date: 04/15/2020 CLINICAL DATA:  Ataxia with near  syncopal episodes EXAM: CT HEAD WITHOUT CONTRAST TECHNIQUE: Contiguous axial images were obtained from the base of the skull through the vertex without intravenous contrast. COMPARISON:  None. FINDINGS: Brain: There is mild diffuse atrophy. There is no intracranial mass, hemorrhage, extra-axial fluid collection, or midline shift. There is patchy small vessel disease in the centra semiovale bilaterally. 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 No acute infarct is demonstrable on this study. Vascular: There is no hyperdense vessel. There is calcification in each distal vertebral artery and carotid siphon region. Skull: The bony calvarium appears intact. Sinuses/Orbits: There is mucosal thickening in several ethmoid air cells. Other visualized paranasal sinuses are clear. Orbits appear symmetric bilaterally. Other: Mastoid air cells are clear. IMPRESSION: Mild atrophy with periventricular small vessel disease. 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 No acute infarct evident. No mass or hemorrhage. There are foci of arterial vascular calcification. There is mucosal thickening in several ethmoid air cells. Electronically Signed   By: Lowella Grip III M.D.   On: 04/15/2020 12:23    Procedures Procedures (including critical care time)  Medications Ordered in ED Medications  sodium zirconium cyclosilicate (LOKELMA) packet 5 g (has no administration in time range)    calcium-vitamin D (OSCAL WITH D) 500-200 MG-UNIT per tablet 4 tablet (has no administration in time range)  sodium chloride 0.9 % bolus 1,000 mL (0 mLs Intravenous Stopped 04/15/20 1353)  ondansetron (ZOFRAN) injection 4 mg (4 mg Intravenous Given 04/15/20 1150)  meclizine (ANTIVERT) tablet 25 mg (25 mg Oral Given 04/15/20 1151)    ED Course  I have reviewed the triage vital signs and the nursing notes.  Pertinent labs & imaging results that were available during my care of the patient were reviewed by me and considered in my medical decision making (see chart for details). CRITICAL CARE Performed by: Milton Ferguson Total critical care time: 35 minutes Critical care time was exclusive of separately billable procedures and treating other patients. Critical care was necessary to treat or prevent imminent or life-threatening deterioration. Critical care was time spent personally by me on the following activities: development of treatment plan with patient and/or surrogate as well as nursing, discussions with consultants, evaluation of patient's response to treatment, examination of patient, obtaining history from patient or surrogate, ordering and performing treatments and interventions, ordering and review of laboratory studies, ordering and review of radiographic studies, pulse oximetry and re-evaluation of patient's condition.    MDM Rules/Calculators/A&P                      Patient with renal failure.  I spoke with nephrology Dr. Marval Regal and he feels like the patient should be admitted over at [facility] under the hospitalist service.     This patient presents to the ED for concern of dizziness, this involves an extensive number of treatment options, and is a complaint that carries with it a high risk of complications and morbidity.  The differential diagnosis includes stroke or metabolic abnormality   Lab Tests:   I Ordered, reviewed, and interpreted labs, which included CBC  and chemistries which showed renal failure and along with anemia elevated potassium and low calcium  Medicines ordered:   I ordered medication calcium supplement for low calcium and treatment for elevated potassium  Imaging Studies ordered:   I ordered imaging studies which included CT head and  I independently visualized and interpreted imaging which showed no acute disease  Additional history obtained:   Additional history obtained from family member  Previous records obtained and reviewed   Consultations Obtained:   I consulted nephrology and hospitalist and discussed lab and imaging findings  Reevaluation:  After the interventions stated above, I reevaluated the patient and found unchanged  Critical Interventions:  .   Final Clinical Impression(s) / ED Diagnoses Final diagnoses:  Acute renal failure with acute tubular necrosis superimposed on stage 4 chronic kidney disease ([place])    Rx / DC Orders ED Discharge Orders    None       Milton Ferguson, MD 04/15/20 1528

## 2020-04-15 NOTE — ED Notes (Signed)
Called [facility] for transport to [facility]. 

## 2020-04-16 ENCOUNTER — Inpatient Hospital Stay (HOSPITAL_COMMUNITY): Payer: Medicare Other

## 2020-04-16 DIAGNOSIS — N179 Acute kidney failure, unspecified: Secondary | ICD-10-CM | POA: Diagnosis not present

## 2020-04-16 DIAGNOSIS — N185 Chronic kidney disease, stage 5: Secondary | ICD-10-CM

## 2020-04-16 DIAGNOSIS — N189 Chronic kidney disease, unspecified: Secondary | ICD-10-CM

## 2020-04-16 DIAGNOSIS — J449 Chronic obstructive pulmonary disease, unspecified: Secondary | ICD-10-CM

## 2020-04-16 LAB — CBC
HCT: 26 % — ABNORMAL LOW (ref 39.0–52.0)
Hemoglobin: 8.1 g/dL — ABNORMAL LOW (ref 13.0–17.0)
MCH: 30 pg (ref 26.0–34.0)
MCHC: 31.2 g/dL (ref 30.0–36.0)
MCV: 96.3 fL (ref 80.0–100.0)
Platelets: 211 10*3/uL (ref 150–400)
RBC: 2.7 MIL/uL — ABNORMAL LOW (ref 4.22–5.81)
RDW: 17.2 % — ABNORMAL HIGH (ref 11.5–15.5)
WBC: 5.4 10*3/uL (ref 4.0–10.5)
nRBC: 0 % (ref 0.0–0.2)

## 2020-04-16 LAB — IRON AND TIBC
Iron: 126 ug/dL (ref 45–182)
Saturation Ratios: 44 % — ABNORMAL HIGH (ref 17.9–39.5)
TIBC: 287 ug/dL (ref 250–450)
UIBC: 161 ug/dL

## 2020-04-16 LAB — FERRITIN: Ferritin: 67 ng/mL (ref 24–336)

## 2020-04-16 LAB — COMPREHENSIVE METABOLIC PANEL
ALT: 15 U/L (ref 0–44)
AST: 22 U/L (ref 15–41)
Albumin: 2.9 g/dL — ABNORMAL LOW (ref 3.5–5.0)
Alkaline Phosphatase: 84 U/L (ref 38–126)
Anion gap: 13 (ref 5–15)
BUN: 83 mg/dL — ABNORMAL HIGH (ref 8–23)
CO2: 15 mmol/L — ABNORMAL LOW (ref 22–32)
Calcium: 5.9 mg/dL — CL (ref 8.9–10.3)
Chloride: 114 mmol/L — ABNORMAL HIGH (ref 98–111)
Creatinine, Ser: 9.85 mg/dL — ABNORMAL HIGH (ref 0.61–1.24)
GFR calc Af Amer: 6 mL/min — ABNORMAL LOW (ref >60)
GFR calc non Af Amer: 5 mL/min — ABNORMAL LOW (ref >60)
Glucose, Bld: 90 mg/dL (ref 70–99)
Potassium: 4.8 mmol/L (ref 3.5–5.1)
Sodium: 142 mmol/L (ref 135–145)
Total Bilirubin: 0.3 mg/dL (ref 0.3–1.2)
Total Protein: 6.1 g/dL — ABNORMAL LOW (ref 6.5–8.1)

## 2020-04-16 LAB — VITAMIN B12: Vitamin B-12: 225 pg/mL (ref 180–914)

## 2020-04-16 LAB — HIV ANTIBODY (ROUTINE TESTING W REFLEX): HIV Screen 4th Generation wRfx: NONREACTIVE

## 2020-04-16 LAB — PHOSPHORUS: Phosphorus: 8.7 mg/dL — ABNORMAL HIGH (ref 2.5–4.6)

## 2020-04-16 LAB — VITAMIN D 25 HYDROXY (VIT D DEFICIENCY, FRACTURES): Vit D, 25-Hydroxy: 10.93 ng/mL — ABNORMAL LOW (ref 30–100)

## 2020-04-16 MED ORDER — AMLODIPINE BESYLATE 5 MG PO TABS
5.0000 mg | ORAL_TABLET | Freq: Every day | ORAL | Status: DC
  Administered 2020-04-17 – 2020-04-20 (×4): 5 mg via ORAL
  Filled 2020-04-16 (×4): qty 1

## 2020-04-16 MED ORDER — CAMPHOR-MENTHOL 0.5-0.5 % EX LOTN
TOPICAL_LOTION | CUTANEOUS | Status: DC | PRN
  Filled 2020-04-16: qty 222

## 2020-04-16 MED ORDER — CALCITRIOL 0.5 MCG PO CAPS
0.5000 ug | ORAL_CAPSULE | Freq: Every day | ORAL | Status: DC
  Administered 2020-04-16 – 2020-04-20 (×5): 0.5 ug via ORAL
  Filled 2020-04-16 (×5): qty 1

## 2020-04-16 MED ORDER — CALCIUM GLUCONATE-NACL 1-0.675 GM/50ML-% IV SOLN
1.0000 g | Freq: Once | INTRAVENOUS | Status: AC
  Administered 2020-04-16: 1000 mg via INTRAVENOUS
  Filled 2020-04-16: qty 50

## 2020-04-16 MED ORDER — CALCIUM CARBONATE 1250 (500 CA) MG PO TABS
1000.0000 mg | ORAL_TABLET | Freq: Every day | ORAL | Status: DC
  Administered 2020-04-16 – 2020-04-19 (×4): 1000 mg via ORAL
  Filled 2020-04-16 (×5): qty 1

## 2020-04-16 MED ORDER — SALINE SPRAY 0.65 % NA SOLN
1.0000 | NASAL | Status: DC | PRN
  Administered 2020-04-16: 1 via NASAL
  Filled 2020-04-16: qty 44

## 2020-04-16 MED ORDER — STERILE WATER FOR INJECTION IV SOLN
INTRAVENOUS | Status: DC
  Filled 2020-04-16 (×9): qty 850

## 2020-04-16 NOTE — Consult Note (Signed)
ASSESSMENT & PLAN   STAGE V CHRONIC KIDNEY DISEASE: We have been consulted for placement of an AV fistula or AV graft and a tunneled dialysis catheter early next week.  I have ordered a vein map and upper extremity arterial duplex for preoperative planning.  He has IVs in both forearms and I will wait to see the results of his vein map before removing one of his IVs.  I have discussed with him the indications for placement of an AV fistula or AV graft.  I explained that if he does have an adequate vein for a fistula would prefer to place a fistula as this generally works longer and has fewer problems.  We have discussed the potential complications of surgery including but not limited to failure of the fistula to mature, graft thrombosis, wound healing problems and bleeding.  We have also been asked to place a tunneled dialysis catheter.  I will try to schedule this for Tuesday next week.  I will follow up on his vein map and arterial duplex.  REASON FOR CONSULT:    Stage V chronic kidney disease.  The consult is requested by Dr. Marval Regal.   HPI:   Kerry Underwood is a 69 y.o. male who was admitted yesterday after a syncopal episode.  He was found to have progressively worsening renal insufficiency and vascular surgery was consulted for hemodialysis access.  I spoke with Dr. Marval Regal and he would like [place] to place a catheter at the same time.  He has had some nausea.  According to the records he has not been taking his medications over the last year his blood pressure has been poorly controlled.  His potassium was 5.7 on admission.  He is right-handed.  He does not have a pacemaker and has not previously had any catheters.  His risk factors for peripheral vascular disease include hypertension and tobacco use.  He currently smokes 10 cigarettes a day.  Is been smoking for 60 years he tells me.  He denies any history of diabetes, hypercholesterolemia, or family history of premature  cardiovascular disease.  Past Medical History:  Diagnosis Date  . CHF (congestive heart failure) ([place])   . COPD (chronic obstructive pulmonary disease) ([place])   . Hypertension   . Renal disorder     History reviewed. No pertinent family history.  SOCIAL HISTORY: Social History   Tobacco Use  . Smoking status: Current Every Day Smoker    Packs/day: 0.50  . Smokeless tobacco: Never Used  Substance Use Topics  . Alcohol use: No    Allergies  Allergen Reactions  . Strawberry (Diagnostic)     Current Facility-Administered Medications  Medication Dose Route Frequency Provider Last Rate Last Admin  . albuterol (PROVENTIL) (2.5 MG/3ML) 0.083% nebulizer solution 2.5 mg  2.5 mg Inhalation Q6H PRN Truett Mainland, DO      . amLODipine (NORVASC) tablet 10 mg  10 mg Oral Daily Truett Mainland, DO   10 mg at 04/15/20 1849  . calcitRIOL (ROCALTROL) capsule 0.5 mcg  0.5 mcg Oral Daily Donato Heinz, MD      . calcium acetate (PHOSLO) capsule 1,334 mg  1,334 mg Oral TID WC Truett Mainland, DO      . calcium carbonate (OS-CAL - dosed in mg of elemental calcium) tablet 1,000 mg of elemental calcium  1,000 mg of elemental calcium Oral QHS Coladonato, Broadus John, MD      . calcium gluconate 1 g/ 50 mL sodium chloride IVPB  1  g Intravenous Once Donato Heinz, MD      . heparin injection 5,000 Units  5,000 Units Subcutaneous Q8H Truett Mainland, DO   5,000 Units at 04/16/20 0981  . metoprolol tartrate (LOPRESSOR) tablet 25 mg  25 mg Oral BID Truett Mainland, DO   25 mg at 04/15/20 2128  . ondansetron (ZOFRAN) tablet 4 mg  4 mg Oral Q6H PRN Truett Mainland, DO       Or  . ondansetron [facility]) injection 4 mg  4 mg Intravenous Q6H PRN Truett Mainland, DO      . polyethylene glycol (MIRALAX / GLYCOLAX) packet 17 g  17 g Oral Daily PRN Truett Mainland, DO      . sodium bicarbonate 150 mEq in sterile water 1,000 mL infusion   Intravenous Continuous Coladonato, Broadus John, MD      . sodium  chloride (OCEAN) 0.65 % nasal spray 1 spray  1 spray Each Nare PRN Truett Mainland, DO   1 spray at 04/16/20 0630  . tamsulosin (FLOMAX) capsule 0.4 mg  0.4 mg Oral Daily Truett Mainland, DO   0.4 mg at 04/15/20 1849  . umeclidinium bromide (INCRUSE ELLIPTA) 62.5 MCG/INH 1 puff  1 puff Inhalation Daily Truett Mainland, DO        REVIEW OF SYSTEMS:  [X]  denotes positive finding, [ ]  denotes negative finding Cardiac  Comments:  Chest pain or chest pressure:    Shortness of breath upon exertion:    Short of breath when lying flat:    Irregular heart rhythm:        Vascular    Pain in calf, thigh, or hip brought on by ambulation:    Pain in feet at night that wakes you up from your sleep:     Blood clot in your veins:    Leg swelling:         Pulmonary    Oxygen at home:    Productive cough:     Wheezing:         Neurologic    Sudden weakness in arms or legs:     Sudden numbness in arms or legs:     Sudden onset of difficulty speaking or slurred speech:    Temporary loss of vision in one eye:     Problems with dizziness:         Gastrointestinal    Blood in stool:     Vomited blood:         Genitourinary    Burning when urinating:     Blood in urine:        Psychiatric    Major depression:         Hematologic    Bleeding problems:    Problems with blood clotting too easily:        Skin    Rashes or ulcers:        Constitutional    Fever or chills:    -  PHYSICAL EXAM:   Vitals:   04/16/20 0121 04/16/20 0154 04/16/20 0425 04/16/20 0430  BP: 118/70 (!) 105/52 122/72 (!) 113/59  Pulse: 73 75 76 74  Resp: 19 18 18 18   Temp: 98.4 F (36.9 C) 98.3 F (36.8 C) 98.1 F (36.7 C) 98.1 F (36.7 C)  TempSrc: Oral Oral Oral Oral  SpO2: 100% 100% 99% 100%  Weight:      Height:       Body mass index is 28.36 kg/m.  GENERAL: The patient is a well-nourished male, in no acute distress. The vital signs are documented above. CARDIAC: There is a regular rate and  rhythm.  VASCULAR: I do not detect carotid bruits. He has palpable brachial and radial pulses bilaterally.  I cannot palpate pedal pulses. He does not have significant lower extremity swelling. PULMONARY: There is good air exchange bilaterally without wheezing or rales. ABDOMEN: Soft and non-tender with normal pitched bowel sounds.  MUSCULOSKELETAL: There are no major deformities. NEUROLOGIC: No focal weakness or paresthesias are detected. SKIN: There are no ulcers or rashes noted. PSYCHIATRIC: The patient has a normal affect.  DATA:    Lab Results  Component Value Date   WBC 5.4 04/16/2020   HGB 8.1 (L) 04/16/2020   HCT 26.0 (L) 04/16/2020   MCV 96.3 04/16/2020   PLT 211 04/16/2020   Lab Results  Component Value Date   NA 142 04/16/2020   K 4.8 04/16/2020   CL 114 (H) 04/16/2020   CO2 15 (L) 04/16/2020   Lab Results  Component Value Date   CREATININE 9.85 (H) 04/16/2020   VEIN MAP: Pending  UPPER EXTREMITY ARTERIAL DUPLEX: Pending  Deitra Mayo [facility]: [phone] Office: [phone]

## 2020-04-16 NOTE — Plan of Care (Signed)
  Problem: Education: Goal: Knowledge of General Education information will improve Description Including pain rating scale, medication(s)/side effects and non-pharmacologic comfort measures Outcome: Progressing   

## 2020-04-16 NOTE — Progress Notes (Addendum)
Patient ID: Kerry Underwood, male   DOB: 1951/08/13, 69 y.o.   MRN: [id number] S: pt without complaints. O:BP (!) 113/59   Pulse 74   Temp 98.1 F (36.7 C) (Oral)   Resp 18   Ht 5\' 5"  (1.651 m)   Wt 77.3 kg   SpO2 100%   BMI 28.36 kg/m   Intake/Output Summary (Last 24 hours) at 04/16/2020 0804 Last data filed at 04/16/2020 0109 Gross per 24 hour  Intake 2263 ml  Output 1000 ml  Net 1263 ml   Intake/Output: I/O last 3 completed shifts: In: 2263 [P.O.:360; I.V.:1161; Blood:742] Out: 1000 [Urine:1000]  Intake/Output this shift:  No intake/output data recorded. Weight change:  Gen:NAD CVS: RRR, no rub Resp: occ exp wheezes Abd: +BS, soft, NT/ND Ext: no edema  Recent Labs  Lab 04/15/20 1246 04/16/20 0700  NA 143 142  K 5.7* 4.8  CL 112* 114*  CO2 16* 15*  GLUCOSE 103* 90  BUN 86* 83*  CREATININE 10.08* 9.85*  ALBUMIN 3.7 2.9*  CALCIUM 6.4* 5.9*  PHOS  --  8.7*  AST 25 22  ALT 17 15   Liver Function Tests: Recent Labs  Lab 04/15/20 1246 04/16/20 0700  AST 25 22  ALT 17 15  ALKPHOS 105 84  BILITOT 0.5 0.3  PROT 7.5 6.1*  ALBUMIN 3.7 2.9*   No results for input(s): LIPASE, AMYLASE in the last 168 hours. No results for input(s): AMMONIA in the last 168 hours. CBC: Recent Labs  Lab 04/15/20 1246 04/16/20 0700  WBC 5.7 5.4  NEUTROABS 2.9  --   HGB 7.6* 8.1*  HCT 25.1* 26.0*  MCV 102.9* 96.3  PLT 267 211   Cardiac Enzymes: No results for input(s): CKTOTAL, CKMB, CKMBINDEX, TROPONINI in the last 168 hours. CBG: No results for input(s): GLUCAP in the last 168 hours.  Iron Studies: No results for input(s): IRON, TIBC, TRANSFERRIN, FERRITIN in the last 72 hours. Studies/Results: CT Head Wo Contrast  Result Date: 04/15/2020 CLINICAL DATA:  Ataxia with near syncopal episodes EXAM: CT HEAD WITHOUT CONTRAST TECHNIQUE: Contiguous axial images were obtained from the base of the skull through the vertex without intravenous contrast. COMPARISON:  None. FINDINGS:  Brain: There is mild diffuse atrophy. There is no intracranial mass, hemorrhage, extra-axial fluid collection, or midline shift. There is patchy small vessel disease in the centra semiovale bilaterally. 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 No acute infarct is demonstrable on this study. Vascular: There is no hyperdense vessel. There is calcification in each distal vertebral artery and carotid siphon region. Skull: The bony calvarium appears intact. Sinuses/Orbits: There is mucosal thickening in several ethmoid air cells. Other visualized paranasal sinuses are clear. Orbits appear symmetric bilaterally. Other: Mastoid air cells are clear. IMPRESSION: Mild atrophy with periventricular small vessel disease. 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 No acute infarct evident. No mass or hemorrhage. There are foci of arterial vascular calcification. There is mucosal thickening in several ethmoid air cells. Electronically Signed   By: Lowella Grip III M.D.   On: 04/15/2020 12:23   US RENAL  Result Date: 04/15/2020 CLINICAL DATA:  Acute renal failure EXAM: RENAL / URINARY TRACT ULTRASOUND COMPLETE COMPARISON:  CT 09/11/2017 FINDINGS: Right Kidney: Renal measurements: 7.6 x 2.8 x 4.4 cm = volume: 47.9 mL. Increased cortical echogenicity. Atrophic with diffuse cortical thinning. No hydronephrosis or mass Left Kidney: Renal measurements: 8.7 x 5 x 4.8 cm = volume: 109.4 mL. Echogenic  cortex. No hydronephrosis. Cyst at the midpole measuring 3 cm. Cortical thinning is present Bladder: Appears normal for degree of bladder distention. Other: None. IMPRESSION: 1. Echogenic kidneys bilaterally consistent with medical renal disease. Marked atrophy of the right kidney with mild atrophy of left kidney. No hydronephrosis 2. Cyst in the left kidney Electronically Signed   By: Donavan Foil M.D.   On: 04/15/2020 22:24   . amLODipine  10 mg Oral Daily  . calcium acetate  1,334 mg Oral  TID WC  . heparin  5,000 Units Subcutaneous Q8H  . metoprolol tartrate  25 mg Oral BID  . tamsulosin  0.4 mg Oral Daily  . umeclidinium bromide  1 puff Inhalation Daily    BMET    Component Value Date/Time   NA 142 04/16/2020 0700   K 4.8 04/16/2020 0700   CL 114 (H) 04/16/2020 0700   CO2 15 (L) 04/16/2020 0700   GLUCOSE 90 04/16/2020 0700   BUN 83 (H) 04/16/2020 0700   CREATININE 9.85 (H) 04/16/2020 0700   CALCIUM 5.9 (LL) 04/16/2020 0700   GFRNONAA 5 (L) 04/16/2020 0700   GFRAA 6 (L) 04/16/2020 0700   CBC    Component Value Date/Time   WBC 5.4 04/16/2020 0700   RBC 2.70 (L) 04/16/2020 0700   HGB 8.1 (L) 04/16/2020 0700   HCT 26.0 (L) 04/16/2020 0700   PLT 211 04/16/2020 0700   MCV 96.3 04/16/2020 0700   MCH 30.0 04/16/2020 0700   MCHC 31.2 04/16/2020 0700   RDW 17.2 (H) 04/16/2020 0700   LYMPHSABS 0.8 04/15/2020 1246   MONOABS 0.4 04/15/2020 1246   EOSABS 1.6 (H) 04/15/2020 1246   BASOSABS 0.0 04/15/2020 1246     Assessment/Plan: 1.  AKI vs. Progressive CKD stage V- mild uremic symptoms.  Agree with starting IVF's and follow UOP and Scr.  Will need vascular access placement with AVF, likely will also require a TDC if his renal function does not significantly improve.   1. 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 We discussed different modalities of RRT including HD, and PD.  He does not have anyone at home for HHD.  He would be amenable to HD if needed. 3. Will consult [facility] to evaluate for vein mapping and TDC for impending need of dialysis. 2. Hypocalcemia- presumably due to secondary HPTH.  Will check vit D levels and iPTH.  Given oral calcium gluconate and started on phoslo for hypercalcemia.  Will give dose of IV calcium and start calcitriol 0.5 mcg daily.  iPTH pending.  3. Hyperkalemia- improved after given lokelma and bicarb.   4. Anemia of CKD- will check iron stores, and will start ESA.  To  receive PRBC's.   5. HTN- poorly controlled due to noncompliance with medications.  To resume previous outpatient medications, amlodipine and metoprolol. 6. Pre-syncope/dizziness- possibly related to significant anemia.   7. Metabolic acidosis- change IVF's to IV bicarb 8. Secondary HPTH- check vit D and iPTH, phos in am.  Will start calcitriol due to hypocalcemia. 9. BPH- with nocturia x 6.  Will check bladder scan and agree with restarting flomax. 10. dCHF- appears volume depleted on exam.  11. COPD- stable.  12. Tobacco abuse- ongoing.  [name], MD [employer] [phone]

## 2020-04-16 NOTE — Plan of Care (Signed)
  Problem: Education: Goal: Knowledge of General Education information will improve Description: Including pain rating scale, medication(s)/side effects and non-pharmacologic comfort measures Outcome: Progressing   Problem: Activity: Goal: Risk for activity intolerance will decrease Outcome: Progressing   

## 2020-04-16 NOTE — Progress Notes (Signed)
Upper extremity mapping and arterial duplex has been completed.   Preliminary results in CV Proc.   Abram Sander 04/16/2020 3:43 PM

## 2020-04-16 NOTE — Progress Notes (Signed)
Progress Note    Kerry Underwood  OEH:[id number] DOB: 1951-06-01  DOA: 04/15/2020 PCP: Patient, No Pcp Per    Brief Narrative:    Medical records reviewed and are as summarized below:  Kerry Underwood is an 69 y.o. male with a history of chronic kidney disease, COPD,  hypertension.  Patient has not seen a physician over the past year and has not been taking his medications for the past year.  Over the past couple weeks, he has been increasingly tired, fatigued, with shortness of breath with activity and feeling like he is going to pass out.  Today, he did pass out and was brought to the hospital by family member.   Assessment/Plan:   Principal Problem:   Acute on chronic renal failure (HCC) Active Problems:   Chronic obstructive pulmonary disease (HCC)   Hypertension secondary to other renal disorders   Benign prostatic hyperplasia with urinary retention   Anemia due to chronic kidney disease   Hyperkalemia   Hypocalcemia   Acute vs progressive chronic renal failure stage V -Patient is slightly tremulous -Nephrology consult appreciated -Vascular consulted for placement of aVF and possibly TCD: Plan for vein map and upper extremity arterial duplex.  Possible surgery on 5/11 -May need dialysis -IV fluids per renal  Hypertension -Amlodipine and metoprolol  Hyperkalemia -Dose of Lokelma given -monitor closely  Hypocalcemia -Patient is status post IV calcium -Calcitriol was started by nephrology -PTH and vitamin D levels are pending  Anemia of chronic kidney disease -patient is Symptomatic - s/p 2 units  BPH -Flomax  COPD -Compensated -continue to monitor    Family Communication/Anticipated D/C date and plan/Code Status   DVT prophylaxis: Heparin Code Status: DNR Disposition Plan: Status is: Inpatient  Remains inpatient appropriate because:Ongoing diagnostic testing needed not appropriate for outpatient work up   Dispo: The patient is from: Home               Anticipated d/c is to: Home              Anticipated d/c date is: > 3 days              Patient currently is not medically stable to d/c.  Patient may need dialysis pending response to IV fluids.   Medical Consultants:    Renal  Vascular surgery     Subjective:   Patient denies shortness of breath  Objective:    Vitals:   04/16/20 0425 04/16/20 0430 04/16/20 0854 04/16/20 0907  BP: 122/72 (!) 113/59  129/76  Pulse: 76 74 82 82  Resp: 18 18  18   Temp: 98.1 F (36.7 C) 98.1 F (36.7 C)  98.6 F (37 C)  TempSrc: Oral Oral  Oral  SpO2: 99% 100%  100%  Weight:      Height:        Intake/Output Summary (Last 24 hours) at 04/16/2020 1155 Last data filed at 04/16/2020 1000 Gross per 24 hour  Intake 2263 ml  Output 1400 ml  Net 863 ml   Filed Weights   04/15/20 1054 04/15/20 1830  Weight: 75.3 kg 77.3 kg    Exam:  General: Appearance:     Chronically ill appearing male in no acute distress     Lungs:     Clear to auscultation bilaterally, respirations unlabored  Heart:    Normal heart rate. Normal rhythm. No murmurs, rubs, or gallops.   MS:   All extremities are intact.   Neurologic:   Awake,  alert, oriented x 3.   tremulous    Data Reviewed:   I have personally reviewed following labs and imaging studies:  Labs: Labs show the following:   Basic Metabolic Panel: Recent Labs  Lab 04/15/20 1246 04/16/20 0700  NA 143 142  K 5.7* 4.8  CL 112* 114*  CO2 16* 15*  GLUCOSE 103* 90  BUN 86* 83*  CREATININE 10.08* 9.85*  CALCIUM 6.4* 5.9*  PHOS  --  8.7*   GFR Estimated Creatinine Clearance: 6.7 mL/min (A) (by C-G formula based on SCr of 9.85 mg/dL (H)). Liver Function Tests: Recent Labs  Lab 04/15/20 1246 04/16/20 0700  AST 25 22  ALT 17 15  ALKPHOS 105 84  BILITOT 0.5 0.3  PROT 7.5 6.1*  ALBUMIN 3.7 2.9*   No results for input(s): LIPASE, AMYLASE in the last 168 hours. No results for input(s): AMMONIA in the last 168 hours. Coagulation  profile No results for input(s): INR, PROTIME in the last 168 hours.  CBC: Recent Labs  Lab 04/15/20 1246 04/16/20 0700  WBC 5.7 5.4  NEUTROABS 2.9  --   HGB 7.6* 8.1*  HCT 25.1* 26.0*  MCV 102.9* 96.3  PLT 267 211   Cardiac Enzymes: No results for input(s): CKTOTAL, CKMB, CKMBINDEX, TROPONINI in the last 168 hours. BNP (last 3 results) No results for input(s): PROBNP in the last 8760 hours. CBG: No results for input(s): GLUCAP in the last 168 hours. D-Dimer: No results for input(s): DDIMER in the last 72 hours. Hgb A1c: No results for input(s): HGBA1C in the last 72 hours. Lipid Profile: No results for input(s): CHOL, HDL, LDLCALC, TRIG, CHOLHDL, LDLDIRECT in the last 72 hours. Thyroid function studies: No results for input(s): TSH, T4TOTAL, T3FREE, THYROIDAB in the last 72 hours.  Invalid input(s): FREET3 Anemia work up: Recent Labs    04/16/20 0700  VITAMINB12 225  FERRITIN 67  TIBC 287  IRON 126   Sepsis Labs: Recent Labs  Lab 04/15/20 1246 04/16/20 0700  WBC 5.7 5.4    Microbiology Recent Results (from the past 240 hour(s))  Respiratory Panel by RT PCR (Flu A&B, Covid) - Nasopharyngeal Swab     Status: None   Collection Time: 04/15/20  2:54 PM   Specimen: Nasopharyngeal Swab  Result Value Ref Range Status   SARS Coronavirus 2 by RT PCR NEGATIVE NEGATIVE Final    Comment: (NOTE) SARS-CoV-2 target nucleic acids are NOT DETECTED. The SARS-CoV-2 RNA is generally detectable in upper respiratoy specimens during the acute phase of infection. The lowest concentration of SARS-CoV-2 viral copies this assay can detect is 131 copies/mL. A negative result does not preclude SARS-Cov-2 infection and should not be used as the sole basis for treatment or other patient management decisions. A negative result may occur with  improper specimen collection/handling, submission of specimen other than nasopharyngeal swab, presence of viral mutation(s) within the areas  targeted by this assay, and inadequate number of viral copies (<131 copies/mL). A negative result must be combined with clinical observations, patient history, and epidemiological information. The expected result is Negative. Fact Sheet for Patients:  PinkCheek.be Fact Sheet for Healthcare Providers:  GravelBags.it This test is not yet ap proved or cleared by the [place] FDA and  has been authorized for detection and/or diagnosis of SARS-CoV-2 by FDA under an Emergency Use Authorization (EUA). This EUA will remain  in effect (meaning this test can be used) for the duration of the COVID-19 declaration under Section 564(b)(1) of the Act,  21 U.S.C. section 360bbb-3(b)(1), unless the authorization is terminated or revoked sooner.    Influenza A by PCR NEGATIVE NEGATIVE Final   Influenza B by PCR NEGATIVE NEGATIVE Final    Comment: (NOTE) The Xpert Xpress SARS-CoV-2/FLU/RSV assay is intended as an aid in  the diagnosis of influenza from Nasopharyngeal swab specimens and  should not be used as a sole basis for treatment. Nasal washings and  aspirates are unacceptable for Xpert Xpress SARS-CoV-2/FLU/RSV  testing. Fact Sheet for Patients: PinkCheek.be Fact Sheet for Healthcare Providers: GravelBags.it This test is not yet approved or cleared by the [place] FDA and  has been authorized for detection and/or diagnosis of SARS-CoV-2 by  FDA under an Emergency Use Authorization (EUA). This EUA will remain  in effect (meaning this test can be used) for the duration of the  Covid-19 declaration under Section 564(b)(1) of the Act, 21  U.S.C. section 360bbb-3(b)(1), unless the authorization is  terminated or revoked. Performed at [facility], [address], [place], [place] [address]     Procedures and diagnostic studies:  CT Head Wo Contrast  Result Date:  04/15/2020 CLINICAL DATA:  Ataxia with near syncopal episodes EXAM: CT HEAD WITHOUT CONTRAST TECHNIQUE: Contiguous axial images were obtained from the base of the skull through the vertex without intravenous contrast. COMPARISON:  None. FINDINGS: Brain: There is mild diffuse atrophy. There is no intracranial mass, hemorrhage, extra-axial fluid collection, or midline shift. There is patchy small vessel disease in the centra semiovale bilaterally. 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 No acute infarct is demonstrable on this study. Vascular: There is no hyperdense vessel. There is calcification in each distal vertebral artery and carotid siphon region. Skull: The bony calvarium appears intact. Sinuses/Orbits: There is mucosal thickening in several ethmoid air cells. Other visualized paranasal sinuses are clear. Orbits appear symmetric bilaterally. Other: Mastoid air cells are clear. IMPRESSION: Mild atrophy with periventricular small vessel disease. 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 No acute infarct evident. No mass or hemorrhage. There are foci of arterial vascular calcification. There is mucosal thickening in several ethmoid air cells. Electronically Signed   By: Lowella Grip III M.D.   On: 04/15/2020 12:23   US RENAL  Result Date: 04/15/2020 CLINICAL DATA:  Acute renal failure EXAM: RENAL / URINARY TRACT ULTRASOUND COMPLETE COMPARISON:  CT 09/11/2017 FINDINGS: Right Kidney: Renal measurements: 7.6 x 2.8 x 4.4 cm = volume: 47.9 mL. Increased cortical echogenicity. Atrophic with diffuse cortical thinning. No hydronephrosis or mass Left Kidney: Renal measurements: 8.7 x 5 x 4.8 cm = volume: 109.4 mL. Echogenic cortex. No hydronephrosis. Cyst at the midpole measuring 3 cm. Cortical thinning is present Bladder: Appears normal for degree of bladder distention. Other: None. IMPRESSION: 1. Echogenic kidneys bilaterally consistent with medical renal disease. Marked  atrophy of the right kidney with mild atrophy of left kidney. No hydronephrosis 2. Cyst in the left kidney Electronically Signed   By: Donavan Foil M.D.   On: 04/15/2020 22:24    Medications:   . amLODipine  10 mg Oral Daily  . calcitRIOL  0.5 mcg Oral Daily  . calcium acetate  1,334 mg Oral TID WC  . calcium carbonate  1,000 mg of elemental calcium Oral QHS  . heparin  5,000 Units Subcutaneous Q8H  . metoprolol tartrate  25 mg Oral BID  . tamsulosin  0.4 mg Oral Daily  . umeclidinium bromide  1 puff Inhalation Daily   Continuous  Infusions: .  sodium bicarbonate (isotonic) infusion in sterile water 75 mL/hr at 04/16/20 0858     LOS: 1 day   Geradine Girt  [facility]   How to contact the [facility] Attending or Consulting provider [place] or covering provider during after hours [place], for this patient?  1. Check the care team in [facility] and look for a) attending/consulting [facility] provider listed and b) the [facility] team listed 2. Log into [email or web address] and use [facility] universal password to access. If you do not have the password, please contact the hospital operator. 3. Locate the [facility] provider you are looking for under [facility] and page to a number that you can be directly reached. 4. If you still have difficulty reaching the provider, please page the [facility] (Director on Call) for the Hospitalists listed on amion for assistance.  04/16/2020, 11:55 AM

## 2020-04-17 LAB — BPAM RBC
Blood Product Expiration Date: 202105292359
Blood Product Expiration Date: 202105292359
ISSUE DATE / TIME: 202105072049
ISSUE DATE / TIME: 202105080134
Unit Type and Rh: 6200
Unit Type and Rh: 6200

## 2020-04-17 LAB — RENAL FUNCTION PANEL
Albumin: 2.7 g/dL — ABNORMAL LOW (ref 3.5–5.0)
Anion gap: 16 — ABNORMAL HIGH (ref 5–15)
BUN: 81 mg/dL — ABNORMAL HIGH (ref 8–23)
CO2: 17 mmol/L — ABNORMAL LOW (ref 22–32)
Calcium: 6.6 mg/dL — ABNORMAL LOW (ref 8.9–10.3)
Chloride: 111 mmol/L (ref 98–111)
Creatinine, Ser: 9 mg/dL — ABNORMAL HIGH (ref 0.61–1.24)
GFR calc Af Amer: 6 mL/min — ABNORMAL LOW (ref >60)
GFR calc non Af Amer: 5 mL/min — ABNORMAL LOW (ref >60)
Glucose, Bld: 91 mg/dL (ref 70–99)
Phosphorus: 7.1 mg/dL — ABNORMAL HIGH (ref 2.5–4.6)
Potassium: 4.3 mmol/L (ref 3.5–5.1)
Sodium: 144 mmol/L (ref 135–145)

## 2020-04-17 LAB — TYPE AND SCREEN
ABO/RH(D): A POS
Antibody Screen: NEGATIVE
Unit division: 0
Unit division: 0

## 2020-04-17 LAB — PARATHYROID HORMONE, INTACT (NO CA): PTH: 207 pg/mL — ABNORMAL HIGH (ref 15–65)

## 2020-04-17 MED ORDER — HYDROXYZINE HCL 25 MG PO TABS
25.0000 mg | ORAL_TABLET | Freq: Three times a day (TID) | ORAL | Status: DC | PRN
  Administered 2020-04-19: 25 mg via ORAL
  Filled 2020-04-17: qty 1

## 2020-04-17 MED ORDER — HYDROXYZINE HCL 25 MG PO TABS
25.0000 mg | ORAL_TABLET | Freq: Once | ORAL | Status: AC
  Administered 2020-04-17: 25 mg via ORAL
  Filled 2020-04-17: qty 1

## 2020-04-17 MED ORDER — ACETAMINOPHEN 325 MG PO TABS
650.0000 mg | ORAL_TABLET | Freq: Four times a day (QID) | ORAL | Status: DC | PRN
  Administered 2020-04-17 – 2020-04-18 (×2): 650 mg via ORAL
  Filled 2020-04-17 (×2): qty 2

## 2020-04-17 MED ORDER — DARBEPOETIN ALFA 40 MCG/0.4ML IJ SOSY
40.0000 ug | PREFILLED_SYRINGE | INTRAMUSCULAR | Status: DC
  Administered 2020-04-17: 40 ug via SUBCUTANEOUS
  Filled 2020-04-17: qty 0.4

## 2020-04-17 NOTE — Progress Notes (Signed)
VASCULAR SURGERY ASSESSMENT & PLAN:   STAGE V CHRONIC KIDNEY DISEASE: Looks like his best option for an AV fistula would be a right basilic vein transposition. I have explained to the patient that this would be done in two stages. I have scheduled him for surgery on Tuesday for placement of an AV fistula or AV graft and tunneled dialysis catheter.  I have ordered to remove the IV from his right arm.  I have explained the indications for placement of an AV fistula or AV graft. I've explained that if at all possible we will place an AV fistula.  I have reviewed the risks of placement of an AV fistula including but not limited to: failure of the fistula to mature, need for subsequent interventions, and thrombosis. In addition I have reviewed the potential complications of placement of an AV graft. These risks include, but are not limited to, graft thrombosis, graft infection, wound healing problems, bleeding, arm swelling, and steal syndrome. All the patient's questions were answered and they are agreeable to proceed with surgery.  SUBJECTIVE:   No specific complaints. No nausea or vomiting. No uremic symptoms.  ROS: no CP. No SOB   PHYSICAL EXAM:   Vitals:   04/16/20 0907 04/16/20 1545 04/16/20 2226 04/17/20 0540  BP: 129/76 127/83 136/70 (!) 128/59  Pulse: 82 81 86 74  Resp: 18 18 18 16   Temp: 98.6 F (37 C) 97.9 F (36.6 C) 98.1 F (36.7 C) 98.8 F (37.1 C)  TempSrc: Oral Oral Oral Oral  SpO2: 100% 100% 99% 99%  Weight:   79.2 kg   Height:       Lungs clear. Palpable brachial and radial pulses bilaterally.  DATA:   ARTERIAL DOPPLER: I have independently interpreted his arterial Doppler study today.  On the right side he has a triphasic radial and ulnar signal with the Doppler.  On the left side he has a triphasic radial and ulnar signal with the Doppler.  UPPER EXTREMITY VEIN MAP: I have independently interpreted his upper extremity vein map. The forearm and upper arm  cephalic vein on the left do not appear to be adequate. Likewise the basilic vein on the left is quite small. On the right side the forearm cephalic vein are small. The basilic vein looks reasonable in size. It looks like the basilic vein on the right is his best chance for a fistula.  Lab Results  Component Value Date   WBC 5.4 04/16/2020   HGB 8.1 (L) 04/16/2020   HCT 26.0 (L) 04/16/2020   MCV 96.3 04/16/2020   PLT 211 04/16/2020   Lab Results  Component Value Date   CREATININE 9.00 (H) 04/17/2020   PROBLEM LIST:    Principal Problem:   Acute on chronic renal failure (HCC) Active Problems:   Chronic obstructive pulmonary disease (HCC)   Hypertension secondary to other renal disorders   Benign prostatic hyperplasia with urinary retention   Anemia due to chronic kidney disease   Hyperkalemia   Hypocalcemia   CURRENT MEDS:   . amLODipine  5 mg Oral Daily  . calcitRIOL  0.5 mcg Oral Daily  . calcium acetate  1,334 mg Oral TID WC  . calcium carbonate  1,000 mg of elemental calcium Oral QHS  . heparin  5,000 Units Subcutaneous Q8H  . metoprolol tartrate  25 mg Oral BID  . tamsulosin  0.4 mg Oral Daily  . umeclidinium bromide  1 puff Inhalation Daily    Deitra Mayo Office: [phone]  04/17/2020  

## 2020-04-17 NOTE — Progress Notes (Signed)
Pt c/o itching all over and Sarna lotion does not help. RN messaged on call to see if we can have an order for Atarax. Awaiting response.   Kerry Underwood

## 2020-04-17 NOTE — Progress Notes (Signed)
Patient ID: Burns Timson, male   DOB: [birth year], 69 y.o.   MRN: [id number] S: Had some diarrhea last night and this morning.  Also lost his iv. O:BP (!) 128/59 (BP Location: Right Arm)   Pulse 74   Temp 98.8 F (37.1 C) (Oral)   Resp 16   Ht 5\' 5"  (1.651 m)   Wt 79.2 kg   SpO2 99%   BMI 29.06 kg/m   Intake/Output Summary (Last 24 hours) at 04/17/2020 1139 Last data filed at 04/17/2020 1100 Gross per 24 hour  Intake 1498.41 ml  Output 975 ml  Net 523.41 ml   Intake/Output: I/O last 3 completed shifts: In: 3350 [P.O.:180; I.V.:2428; Blood:742] Out: 2375 [Urine:2375]  Intake/Output this shift:  Total I/O In: 171.4 [I.V.:171.4] Out: -  Weight change: 3.903 kg Gen: NAD CVS: RRR, no rub Resp: cta Abd: +BS, soft, NT/ND Ext: no edema  Recent Labs  Lab 04/15/20 1246 04/16/20 0700 04/17/20 0332  NA 143 142 144  K 5.7* 4.8 4.3  CL 112* 114* 111  CO2 16* 15* 17*  GLUCOSE 103* 90 91  BUN 86* 83* 81*  CREATININE 10.08* 9.85* 9.00*  ALBUMIN 3.7 2.9* 2.7*  CALCIUM 6.4* 5.9* 6.6*  PHOS  --  8.7* 7.1*  AST 25 22  --   ALT 17 15  --    Liver Function Tests: Recent Labs  Lab 04/15/20 1246 04/16/20 0700 04/17/20 0332  AST 25 22  --   ALT 17 15  --   ALKPHOS 105 84  --   BILITOT 0.5 0.3  --   PROT 7.5 6.1*  --   ALBUMIN 3.7 2.9* 2.7*   No results for input(s): LIPASE, AMYLASE in the last 168 hours. No results for input(s): AMMONIA in the last 168 hours. CBC: Recent Labs  Lab 04/15/20 1246 04/16/20 0700  WBC 5.7 5.4  NEUTROABS 2.9  --   HGB 7.6* 8.1*  HCT 25.1* 26.0*  MCV 102.9* 96.3  PLT 267 211   Cardiac Enzymes: No results for input(s): CKTOTAL, CKMB, CKMBINDEX, TROPONINI in the last 168 hours. CBG: No results for input(s): GLUCAP in the last 168 hours.  Iron Studies:  Recent Labs    04/16/20 0700  IRON 126  TIBC 287  FERRITIN 67   Studies/Results: CT Head Wo Contrast  Result Date: 04/15/2020 CLINICAL DATA:  Ataxia with near syncopal episodes  EXAM: CT HEAD WITHOUT CONTRAST TECHNIQUE: Contiguous axial images were obtained from the base of the skull through the vertex without intravenous contrast. COMPARISON:  None. FINDINGS: Brain: There is mild diffuse atrophy. There is no intracranial mass, hemorrhage, extra-axial fluid collection, or midline shift. There is patchy small vessel disease in the centra semiovale bilaterally. 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 No acute infarct is demonstrable on this study. Vascular: There is no hyperdense vessel. There is calcification in each distal vertebral artery and carotid siphon region. Skull: The bony calvarium appears intact. Sinuses/Orbits: There is mucosal thickening in several ethmoid air cells. Other visualized paranasal sinuses are clear. Orbits appear symmetric bilaterally. Other: Mastoid air cells are clear. IMPRESSION: Mild atrophy with periventricular small vessel disease. 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 No acute infarct evident. No mass or hemorrhage. There are foci of arterial vascular calcification. There is mucosal thickening in several ethmoid air cells. Electronically Signed   By: Lowella Grip III M.D.   On: 04/15/2020 12:23   US RENAL  Result Date:  04/15/2020 CLINICAL DATA:  Acute renal failure EXAM: RENAL / URINARY TRACT ULTRASOUND COMPLETE COMPARISON:  CT 09/11/2017 FINDINGS: Right Kidney: Renal measurements: 7.6 x 2.8 x 4.4 cm = volume: 47.9 mL. Increased cortical echogenicity. Atrophic with diffuse cortical thinning. No hydronephrosis or mass Left Kidney: Renal measurements: 8.7 x 5 x 4.8 cm = volume: 109.4 mL. Echogenic cortex. No hydronephrosis. Cyst at the midpole measuring 3 cm. Cortical thinning is present Bladder: Appears normal for degree of bladder distention. Other: None. IMPRESSION: 1. Echogenic kidneys bilaterally consistent with medical renal disease. Marked atrophy of the right kidney with mild atrophy of left kidney.  No hydronephrosis 2. Cyst in the left kidney Electronically Signed   By: Donavan Foil M.D.   On: 04/15/2020 22:24   VAS [place] UPPER EXTREMITY ARTERIAL DUPLEX  Result Date: 04/17/2020 UPPER EXTREMITY DUPLEX STUDY Indications: Patient complains of for avf/avg Tuesday next week.  Risk Factors: Hypertension. Comparison Study: no prior Performing Technologist: Abram Sander RVS  Examination Guidelines: A complete evaluation includes B-mode imaging, spectral Doppler, color Doppler, and power Doppler as needed of all accessible portions of each vessel. Bilateral testing is considered an integral part of a complete examination. Limited examinations for reoccurring indications may be performed as noted.  Right Doppler Findings: +---------------+----------+---------+--------+--------+ Site           PSV (cm/s)Waveform StenosisComments +---------------+----------+---------+--------+--------+ Subclavian Prox81        triphasic                 +---------------+----------+---------+--------+--------+                                                    +---------------+----------+---------+--------+--------+ Axillary       154.00    triphasic                 +---------------+----------+---------+--------+--------+ Brachial Prox  115       triphasic                 +---------------+----------+---------+--------+--------+ Brachial Mid   123       triphasic                 +---------------+----------+---------+--------+--------+ Brachial Dist  97        triphasic                 +---------------+----------+---------+--------+--------+ Radial Dist    165       triphasic                 +---------------+----------+---------+--------+--------+ Ulnar Dist     111       triphasic                 +---------------+----------+---------+--------+--------+  Left Doppler Findings: +---------------+----------+---------+--------+--------+ Site           PSV (cm/s)Waveform StenosisComments  +---------------+----------+---------+--------+--------+ Subclavian Prox114       triphasic                 +---------------+----------+---------+--------+--------+ Axillary       82.00     triphasic                 +---------------+----------+---------+--------+--------+ Brachial Prox  103       triphasic                 +---------------+----------+---------+--------+--------+ Brachial Mid   73  triphasic                 +---------------+----------+---------+--------+--------+ Brachial Dist  86        triphasic                 +---------------+----------+---------+--------+--------+ Radial Dist    62        triphasic                 +---------------+----------+---------+--------+--------+ Ulnar Dist     166       triphasic                 +---------------+----------+---------+--------+--------+   Summary:  Right: No obstruction visualized in the right upper extremity. Left: No obstruction visualized in the left upper extremity. *See table(s) above for measurements and observations. Electronically signed by Deitra Mayo MD on 04/17/2020 at 7:21:17 AM.    Final    VAS [place] UPPER EXT VEIN MAPPING (PRE-OP AVF)  Result Date: 04/17/2020 UPPER EXTREMITY VEIN MAPPING  Indications: History of PAD; patient is pre-operative for bypass. Comparison Study: no prior Performing Technologist: Abram Sander RVS  Examination Guidelines: A complete evaluation includes B-mode imaging, spectral Doppler, color Doppler, and power Doppler as needed of all accessible portions of each vessel. Bilateral testing is considered an integral part of a complete examination. Limited examinations for reoccurring indications may be performed as noted. +-----------------+-------------+----------+--------------+ Right Cephalic   Diameter (cm)Depth (cm)   Findings    +-----------------+-------------+----------+--------------+ Shoulder             0.24        1.07                   +-----------------+-------------+----------+--------------+ Prox upper arm       0.20        0.66                  +-----------------+-------------+----------+--------------+ Mid upper arm        0.22        0.57                  +-----------------+-------------+----------+--------------+ Dist upper arm       0.23        0.71     branching    +-----------------+-------------+----------+--------------+ Antecubital fossa    0.32        0.58                  +-----------------+-------------+----------+--------------+ Prox forearm         0.32        0.62                  +-----------------+-------------+----------+--------------+ Mid forearm                               branching    +-----------------+-------------+----------+--------------+ Dist forearm                             iv bandages   +-----------------+-------------+----------+--------------+ Wrist                                   not visualized +-----------------+-------------+----------+--------------+ +-----------------+-------------+----------+------------------------------+ Right Basilic    Diameter (cm)Depth (cm)           Findings            +-----------------+-------------+----------+------------------------------+  Prox upper arm       0.36        1.24                                  +-----------------+-------------+----------+------------------------------+ Mid upper arm        0.42        0.84                                  +-----------------+-------------+----------+------------------------------+ Dist upper arm       0.48        0.78                                  +-----------------+-------------+----------+------------------------------+ Antecubital fossa    0.30        0.40                                  +-----------------+-------------+----------+------------------------------+ Prox forearm         0.46        0.50             branching             +-----------------+-------------+----------+------------------------------+ Mid forearm                             not visualized and iv bandages +-----------------+-------------+----------+------------------------------+ Distal forearm                          not visualized and iv bandages +-----------------+-------------+----------+------------------------------+ Elbow                                           not visualized         +-----------------+-------------+----------+------------------------------+ Wrist                                           not visualized         +-----------------+-------------+----------+------------------------------+ +-----------------+-------------+----------+-----------+ Left Cephalic    Diameter (cm)Depth (cm) Findings   +-----------------+-------------+----------+-----------+ Shoulder             0.26        0.62               +-----------------+-------------+----------+-----------+ Prox upper arm       0.18        0.72               +-----------------+-------------+----------+-----------+ Mid upper arm        0.18        0.73               +-----------------+-------------+----------+-----------+ Dist upper arm       0.24        0.69               +-----------------+-------------+----------+-----------+ Antecubital fossa    0.53        0.39               +-----------------+-------------+----------+-----------+  Prox forearm         0.27        0.40               +-----------------+-------------+----------+-----------+ Mid forearm          0.37        0.38               +-----------------+-------------+----------+-----------+ Dist forearm                            iv bandages +-----------------+-------------+----------+-----------+ +-----------------+-------------+----------+--------------+ Left Basilic     Diameter (cm)Depth (cm)   Findings    +-----------------+-------------+----------+--------------+  Prox upper arm       0.43        1.12                  +-----------------+-------------+----------+--------------+ Mid upper arm        0.43        1.39                  +-----------------+-------------+----------+--------------+ Dist upper arm       0.22        0.99                  +-----------------+-------------+----------+--------------+ Antecubital fossa    0.24        0.63                  +-----------------+-------------+----------+--------------+ Prox forearm                            not visualized +-----------------+-------------+----------+--------------+ Mid forearm                              iv bandages   +-----------------+-------------+----------+--------------+ Distal forearm                           iv bandages   +-----------------+-------------+----------+--------------+ *See table(s) above for measurements and observations.  Diagnosing physician: Deitra Mayo MD Electronically signed by Deitra Mayo MD on 04/17/2020 at 7:21:26 AM.    Final    . amLODipine  5 mg Oral Daily  . calcitRIOL  0.5 mcg Oral Daily  . calcium acetate  1,334 mg Oral TID WC  . calcium carbonate  1,000 mg of elemental calcium Oral QHS  . heparin  5,000 Units Subcutaneous Q8H  . metoprolol tartrate  25 mg Oral BID  . tamsulosin  0.4 mg Oral Daily  . umeclidinium bromide  1 puff Inhalation Daily    BMET    Component Value Date/Time   NA 144 04/17/2020 0332   K 4.3 04/17/2020 0332   CL 111 04/17/2020 0332   CO2 17 (L) 04/17/2020 0332   GLUCOSE 91 04/17/2020 0332   BUN 81 (H) 04/17/2020 0332   CREATININE 9.00 (H) 04/17/2020 0332   CALCIUM 6.6 (L) 04/17/2020 0332   GFRNONAA 5 (L) 04/17/2020 0332   GFRAA 6 (L) 04/17/2020 0332   CBC    Component Value Date/Time   WBC 5.4 04/16/2020 0700   RBC 2.70 (L) 04/16/2020 0700   HGB 8.1 (L) 04/16/2020 0700   HCT 26.0 (L) 04/16/2020 0700   PLT 211 04/16/2020 0700   MCV 96.3 04/16/2020 0700   MCH 30.0  04/16/2020 0700   MCHC 31.2 04/16/2020 0700  RDW 17.2 (H) 04/16/2020 0700   LYMPHSABS 0.8 04/15/2020 1246   MONOABS 0.4 04/15/2020 1246   EOSABS 1.6 (H) 04/15/2020 1246   BASOSABS 0.0 04/15/2020 1246    Assessment/Plan: 1. AKI vs. Progressive CKD stage V- mild uremic symptoms. Agree with starting IVF's and follow UOP and Scr. Will need vascular access placement with AVF, likely will also require a TDC if his renal function does not significantly improve.  1. 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 We discussed different modalities of RRT including HD, and PD. He does not have anyone at home for HHD. He would be amenable to HD if needed. 3. Consulted [facility] to evaluate for vein mapping and TDC for impending need of dialysis.  Scheduled for 04/19/20 and will initiate HD afterwards. 2. Hypocalcemia- presumably due to secondary HPTH. Will check vit D levels and iPTH. Given oral calcium gluconate and started on phoslo for hypercalcemia.   1. Gave dose of IV calcium  2. started calcitriol 0.5 mcg daily.  iPTH pending.  3. Also on phoslo with meals 4. Continue to follow. 3. Hyperkalemia- improved after given lokelma and bicarb.  4. Anemia of CKD- will check iron stores, and will start ESA. To receive PRBC's.  5. HTN- poorly controlled due to noncompliance with medications. To resume previous outpatient medications, amlodipine and metoprolol. 6. Pre-syncope/dizziness- possibly related to significant anemia.  7. 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 Will start calcitriol due to hypocalcemia. 9. BPH- with nocturia x 6. Will check bladder scan and agree with restarting flomax. 10. dCHF- appears volume depleted on exam.  11. COPD- stable.  12. Tobacco abuse- ongoing.  [name], MD [employer] [phone]

## 2020-04-17 NOTE — Progress Notes (Signed)
Progress Note    Kerry Underwood  OEV:[id number] DOB: October 21, 1951  DOA: 04/15/2020 PCP: Patient, No Pcp Per    Brief Narrative:    Medical records reviewed and are as summarized below:  Kerry Underwood is an 69 y.o. male with a history of chronic kidney disease, COPD,  hypertension.  Patient has not seen a physician over the past year and has not been taking his medications for the past year.  Over the past couple weeks, he has been increasingly tired, fatigued, with shortness of breath with activity and feeling like he is going to pass out.  Today, he did pass out and was brought to the hospital by family member.   Assessment/Plan:   Principal Problem:   Acute on chronic renal failure (HCC) Active Problems:   Chronic obstructive pulmonary disease (HCC)   Hypertension secondary to other renal disorders   Benign prostatic hyperplasia with urinary retention   Anemia due to chronic kidney disease   Hyperkalemia   Hypocalcemia   Acute vs progressive chronic renal failure stage V -Patient is slightly tremulous -Nephrology consult appreciated -Vascular consulted for placement of aVF and possibly TCD: Plan for vein map and upper extremity arterial duplex.  Possible surgery on 5/11 -dialysis to start after -IV fluids per renal  Hypertension -Amlodipine and metoprolol  Hyperkalemia -Dose of Lokelma given -monitor closely  Hypocalcemia -Patient is status post IV calcium -Calcitriol was started by nephrology -PTH pending and vit D low  Anemia of chronic kidney disease -patient was Symptomatic - s/p 2 units  BPH -Flomax  COPD -Compensated -continue to monitor  Itching  - atarax or sarna   Family Communication/Anticipated D/C date and plan/Code Status   DVT prophylaxis: Heparin Code Status: DNR Disposition Plan: Status is: Inpatient  Remains inpatient appropriate because:Ongoing diagnostic testing needed not appropriate for outpatient work up   Dispo: The patient  is from: Home              Anticipated d/c is to: Home              Anticipated d/c date is: > 3 days              Patient currently is not medically stable to d/c.  Patient  need dialysis and access placed   Medical Consultants:    Renal  Vascular surgery     Subjective:   C/o itching  Objective:    Vitals:   04/16/20 0907 04/16/20 1545 04/16/20 2226 04/17/20 0540  BP: 129/76 127/83 136/70 (!) 128/59  Pulse: 82 81 86 74  Resp: 18 18 18 16   Temp: 98.6 F (37 C) 97.9 F (36.6 C) 98.1 F (36.7 C) 98.8 F (37.1 C)  TempSrc: Oral Oral Oral Oral  SpO2: 100% 100% 99% 99%  Weight:   79.2 kg   Height:        Intake/Output Summary (Last 24 hours) at 04/17/2020 1111 Last data filed at 04/17/2020 8299 Gross per 24 hour  Intake 1327.02 ml  Output 975 ml  Net 352.02 ml   Filed Weights   04/15/20 1054 04/15/20 1830 04/16/20 2226  Weight: 75.3 kg 77.3 kg 79.2 kg    Exam:  General: Appearance:     chronically ill male in no acute distress     Lungs:     Clear to auscultation bilaterally, respirations unlabored  Heart:    Normal heart rate. Normal rhythm. No murmurs, rubs, or gallops.   MS:   All extremities  are intact.   Neurologic:   Awake, alert, oriented x 3. No apparent focal neurological           defect.     Data Reviewed:   I have personally reviewed following labs and imaging studies:  Labs: Labs show the following:   Basic Metabolic Panel: Recent Labs  Lab 04/15/20 1246 04/15/20 1246 04/16/20 0700 04/17/20 0332  NA 143  --  142 144  K 5.7*   < > 4.8 4.3  CL 112*  --  114* 111  CO2 16*  --  15* 17*  GLUCOSE 103*  --  90 91  BUN 86*  --  83* 81*  CREATININE 10.08*  --  9.85* 9.00*  CALCIUM 6.4*  --  5.9* 6.6*  PHOS  --   --  8.7* 7.1*   < > = values in this interval not displayed.   GFR Estimated Creatinine Clearance: 7.4 mL/min (A) (by C-G formula based on SCr of 9 mg/dL (H)). Liver Function Tests: Recent Labs  Lab 04/15/20 1246  04/16/20 0700 04/17/20 0332  AST 25 22  --   ALT 17 15  --   ALKPHOS 105 84  --   BILITOT 0.5 0.3  --   PROT 7.5 6.1*  --   ALBUMIN 3.7 2.9* 2.7*   No results for input(s): LIPASE, AMYLASE in the last 168 hours. No results for input(s): AMMONIA in the last 168 hours. Coagulation profile No results for input(s): INR, PROTIME in the last 168 hours.  CBC: Recent Labs  Lab 04/15/20 1246 04/16/20 0700  WBC 5.7 5.4  NEUTROABS 2.9  --   HGB 7.6* 8.1*  HCT 25.1* 26.0*  MCV 102.9* 96.3  PLT 267 211   Cardiac Enzymes: No results for input(s): CKTOTAL, CKMB, CKMBINDEX, TROPONINI in the last 168 hours. BNP (last 3 results) No results for input(s): PROBNP in the last 8760 hours. CBG: No results for input(s): GLUCAP in the last 168 hours. D-Dimer: No results for input(s): DDIMER in the last 72 hours. Hgb A1c: No results for input(s): HGBA1C in the last 72 hours. Lipid Profile: No results for input(s): CHOL, HDL, LDLCALC, TRIG, CHOLHDL, LDLDIRECT in the last 72 hours. Thyroid function studies: No results for input(s): TSH, T4TOTAL, T3FREE, THYROIDAB in the last 72 hours.  Invalid input(s): FREET3 Anemia work up: Recent Labs    04/16/20 0700  VITAMINB12 225  FERRITIN 67  TIBC 287  IRON 126   Sepsis Labs: Recent Labs  Lab 04/15/20 1246 04/16/20 0700  WBC 5.7 5.4    Microbiology Recent Results (from the past 240 hour(s))  Respiratory Panel by RT PCR (Flu A&B, Covid) - Nasopharyngeal Swab     Status: None   Collection Time: 04/15/20  2:54 PM   Specimen: Nasopharyngeal Swab  Result Value Ref Range Status   SARS Coronavirus 2 by RT PCR NEGATIVE NEGATIVE Final    Comment: (NOTE) SARS-CoV-2 target nucleic acids are NOT DETECTED. The SARS-CoV-2 RNA is generally detectable in upper respiratoy specimens during the acute phase of infection. The lowest concentration of SARS-CoV-2 viral copies this assay can detect is 131 copies/mL. A negative result does not preclude  SARS-Cov-2 infection and should not be used as the sole basis for treatment or other patient management decisions. A negative result may occur with  improper specimen collection/handling, submission of specimen other than nasopharyngeal swab, presence of viral mutation(s) within the areas targeted by this assay, and inadequate number of viral copies (<131 copies/mL).  A negative result must be combined with clinical observations, patient history, and epidemiological information. The expected result is Negative. Fact Sheet for Patients:  PinkCheek.be Fact Sheet for Healthcare Providers:  GravelBags.it This test is not yet ap proved or cleared by the [place] FDA and  has been authorized for detection and/or diagnosis of SARS-CoV-2 by FDA under an Emergency Use Authorization (EUA). This EUA will remain  in effect (meaning this test can be used) for the duration of the COVID-19 declaration under Section 564(b)(1) of the Act, 21 U.S.C. section 360bbb-3(b)(1), unless the authorization is terminated or revoked sooner.    Influenza A by PCR NEGATIVE NEGATIVE Final   Influenza B by PCR NEGATIVE NEGATIVE Final    Comment: (NOTE) The Xpert Xpress SARS-CoV-2/FLU/RSV assay is intended as an aid in  the diagnosis of influenza from Nasopharyngeal swab specimens and  should not be used as a sole basis for treatment. Nasal washings and  aspirates are unacceptable for Xpert Xpress SARS-CoV-2/FLU/RSV  testing. Fact Sheet for Patients: PinkCheek.be Fact Sheet for Healthcare Providers: GravelBags.it This test is not yet approved or cleared by the [place] FDA and  has been authorized for detection and/or diagnosis of SARS-CoV-2 by  FDA under an Emergency Use Authorization (EUA). This EUA will remain  in effect (meaning this test can be used) for the duration of the  Covid-19  declaration under Section 564(b)(1) of the Act, 21  U.S.C. section 360bbb-3(b)(1), unless the authorization is  terminated or revoked. Performed at [facility], [address] [place], [place] [address]     Procedures and diagnostic studies:  CT Head Wo Contrast  Result Date: 04/15/2020 CLINICAL DATA:  Ataxia with near syncopal episodes EXAM: CT HEAD WITHOUT CONTRAST TECHNIQUE: Contiguous axial images were obtained from the base of the skull through the vertex without intravenous contrast. COMPARISON:  None. FINDINGS: Brain: There is mild diffuse atrophy. There is no intracranial mass, hemorrhage, extra-axial fluid collection, or midline shift. There is patchy small vessel disease in the centra semiovale bilaterally. 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 No acute infarct is demonstrable on this study. Vascular: There is no hyperdense vessel. There is calcification in each distal vertebral artery and carotid siphon region. Skull: The bony calvarium appears intact. Sinuses/Orbits: There is mucosal thickening in several ethmoid air cells. Other visualized paranasal sinuses are clear. Orbits appear symmetric bilaterally. Other: Mastoid air cells are clear. IMPRESSION: Mild atrophy with periventricular small vessel disease. 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 No acute infarct evident. No mass or hemorrhage. There are foci of arterial vascular calcification. There is mucosal thickening in several ethmoid air cells. Electronically Signed   By: Lowella Grip III M.D.   On: 04/15/2020 12:23   US RENAL  Result Date: 04/15/2020 CLINICAL DATA:  Acute renal failure EXAM: RENAL / URINARY TRACT ULTRASOUND COMPLETE COMPARISON:  CT 09/11/2017 FINDINGS: Right Kidney: Renal measurements: 7.6 x 2.8 x 4.4 cm = volume: 47.9 mL. Increased cortical echogenicity. Atrophic with diffuse cortical thinning. No hydronephrosis or mass Left Kidney: Renal measurements: 8.7 x 5 x 4.8  cm = volume: 109.4 mL. Echogenic cortex. No hydronephrosis. Cyst at the midpole measuring 3 cm. Cortical thinning is present Bladder: Appears normal for degree of bladder distention. Other: None. IMPRESSION: 1. Echogenic kidneys bilaterally consistent with medical renal disease. Marked atrophy of the right kidney with mild atrophy of left kidney. No hydronephrosis 2. Cyst in the left kidney Electronically Signed  By: Donavan Foil M.D.   On: 04/15/2020 22:24   VAS [place] UPPER EXTREMITY ARTERIAL DUPLEX  Result Date: 04/17/2020 UPPER EXTREMITY DUPLEX STUDY Indications: Patient complains of for avf/avg Tuesday next week.  Risk Factors: Hypertension. Comparison Study: no prior Performing Technologist: Abram Sander RVS  Examination Guidelines: A complete evaluation includes B-mode imaging, spectral Doppler, color Doppler, and power Doppler as needed of all accessible portions of each vessel. Bilateral testing is considered an integral part of a complete examination. Limited examinations for reoccurring indications may be performed as noted.  Right Doppler Findings: +---------------+----------+---------+--------+--------+  Site            PSV (cm/s) Waveform  Stenosis Comments  +---------------+----------+---------+--------+--------+  Subclavian Prox 81         triphasic                    +---------------+----------+---------+--------+--------+                                                          +---------------+----------+---------+--------+--------+  Axillary        154.00     triphasic                    +---------------+----------+---------+--------+--------+  Brachial Prox   115        triphasic                    +---------------+----------+---------+--------+--------+  Brachial Mid    123        triphasic                    +---------------+----------+---------+--------+--------+  Brachial Dist   97         triphasic                    +---------------+----------+---------+--------+--------+  Radial Dist      165        triphasic                    +---------------+----------+---------+--------+--------+  Ulnar Dist      111        triphasic                    +---------------+----------+---------+--------+--------+  Left Doppler Findings: +---------------+----------+---------+--------+--------+  Site            PSV (cm/s) Waveform  Stenosis Comments  +---------------+----------+---------+--------+--------+  Subclavian Prox 114        triphasic                    +---------------+----------+---------+--------+--------+  Axillary        82.00      triphasic                    +---------------+----------+---------+--------+--------+  Brachial Prox   103        triphasic                    +---------------+----------+---------+--------+--------+  Brachial Mid    73         triphasic                    +---------------+----------+---------+--------+--------+  Brachial Dist   86  triphasic                    +---------------+----------+---------+--------+--------+  Radial Dist     62         triphasic                    +---------------+----------+---------+--------+--------+  Ulnar Dist      166        triphasic                    +---------------+----------+---------+--------+--------+   Summary:  Right: No obstruction visualized in the right upper extremity. Left: No obstruction visualized in the left upper extremity. *See table(s) above for measurements and observations. Electronically signed by Deitra Mayo MD on 04/17/2020 at 7:21:17 AM.    Final    VAS [place] UPPER EXT VEIN MAPPING (PRE-OP AVF)  Result Date: 04/17/2020 UPPER EXTREMITY VEIN MAPPING  Indications: History of PAD; patient is pre-operative for bypass. Comparison Study: no prior Performing Technologist: Abram Sander RVS  Examination Guidelines: A complete evaluation includes B-mode imaging, spectral Doppler, color Doppler, and power Doppler as needed of all accessible portions of each vessel. Bilateral testing is considered an integral part of a  complete examination. Limited examinations for reoccurring indications may be performed as noted. +-----------------+-------------+----------+--------------+  Right Cephalic    Diameter (cm) Depth (cm)    Findings     +-----------------+-------------+----------+--------------+  Shoulder              0.24         1.07                    +-----------------+-------------+----------+--------------+  Prox upper arm        0.20         0.66                    +-----------------+-------------+----------+--------------+  Mid upper arm         0.22         0.57                    +-----------------+-------------+----------+--------------+  Dist upper arm        0.23         0.71      branching     +-----------------+-------------+----------+--------------+  Antecubital fossa     0.32         0.58                    +-----------------+-------------+----------+--------------+  Prox forearm          0.32         0.62                    +-----------------+-------------+----------+--------------+  Mid forearm                                  branching     +-----------------+-------------+----------+--------------+  Dist forearm                                iv bandages    +-----------------+-------------+----------+--------------+  Wrist  not visualized  +-----------------+-------------+----------+--------------+ +-----------------+-------------+----------+------------------------------+  Right Basilic     Diameter (cm) Depth (cm)            Findings             +-----------------+-------------+----------+------------------------------+  Prox upper arm        0.36         1.24                                    +-----------------+-------------+----------+------------------------------+  Mid upper arm         0.42         0.84                                    +-----------------+-------------+----------+------------------------------+  Dist upper arm        0.48         0.78                                     +-----------------+-------------+----------+------------------------------+  Antecubital fossa     0.30         0.40                                    +-----------------+-------------+----------+------------------------------+  Prox forearm          0.46         0.50              branching             +-----------------+-------------+----------+------------------------------+  Mid forearm                                not visualized and iv bandages  +-----------------+-------------+----------+------------------------------+  Distal forearm                             not visualized and iv bandages  +-----------------+-------------+----------+------------------------------+  Elbow                                              not visualized          +-----------------+-------------+----------+------------------------------+  Wrist                                              not visualized          +-----------------+-------------+----------+------------------------------+ +-----------------+-------------+----------+-----------+  Left Cephalic     Diameter (cm) Depth (cm)  Findings    +-----------------+-------------+----------+-----------+  Shoulder              0.26         0.62                 +-----------------+-------------+----------+-----------+  Prox upper arm        0.18         0.72                 +-----------------+-------------+----------+-----------+  Mid upper arm         0.18         0.73                 +-----------------+-------------+----------+-----------+  Dist upper arm        0.24         0.69                 +-----------------+-------------+----------+-----------+  Antecubital fossa     0.53         0.39                 +-----------------+-------------+----------+-----------+  Prox forearm          0.27         0.40                 +-----------------+-------------+----------+-----------+  Mid forearm           0.37         0.38                  +-----------------+-------------+----------+-----------+  Dist forearm                               iv bandages  +-----------------+-------------+----------+-----------+ +-----------------+-------------+----------+--------------+  Left Basilic      Diameter (cm) Depth (cm)    Findings     +-----------------+-------------+----------+--------------+  Prox upper arm        0.43         1.12                    +-----------------+-------------+----------+--------------+  Mid upper arm         0.43         1.39                    +-----------------+-------------+----------+--------------+  Dist upper arm        0.22         0.99                    +-----------------+-------------+----------+--------------+  Antecubital fossa     0.24         0.63                    +-----------------+-------------+----------+--------------+  Prox forearm                               not visualized  +-----------------+-------------+----------+--------------+  Mid forearm                                 iv bandages    +-----------------+-------------+----------+--------------+  Distal forearm                              iv bandages    +-----------------+-------------+----------+--------------+ *See table(s) above for measurements and observations.  Diagnosing physician: Deitra Mayo MD Electronically signed by Deitra Mayo MD on 04/17/2020 at 7:21:26 AM.    Final     Medications:    amLODipine  5 mg Oral Daily   calcitRIOL  0.5 mcg Oral Daily   calcium acetate  1,334 mg Oral TID WC   calcium carbonate  1,000 mg of elemental calcium Oral QHS  heparin  5,000 Units Subcutaneous Q8H   metoprolol tartrate  25 mg Oral BID   tamsulosin  0.4 mg Oral Daily   umeclidinium bromide  1 puff Inhalation Daily   Continuous Infusions:   sodium bicarbonate (isotonic) infusion in sterile water 75 mL/hr at 04/17/20 0132     LOS: 2 days   Geradine Girt  [facility]   How to contact the [facility] Attending or  Consulting provider [place] or covering provider during after hours [place], for this patient?  1. Check the care team in [facility] and look for a) attending/consulting [facility] provider listed and b) the [facility] team listed 2. Log into [email or web address] and use 's universal password to access. If you do not have the password, please contact the hospital operator. 3. Locate the [facility] provider you are looking for under [facility] and page to a number that you can be directly reached. 4. If you still have difficulty reaching the provider, please page the [facility] (Director on Call) for the Hospitalists listed on amion for assistance.  04/17/2020, 11:11 AM

## 2020-04-18 LAB — FOLATE RBC
Folate, Hemolysate: 303 ng/mL
Folate, RBC: 1161 ng/mL (ref >498)
Hematocrit: 26.1 % — ABNORMAL LOW (ref 37.5–51.0)

## 2020-04-18 LAB — CBC
HCT: 24.2 % — ABNORMAL LOW (ref 39.0–52.0)
Hemoglobin: 7.7 g/dL — ABNORMAL LOW (ref 13.0–17.0)
MCH: 29.8 pg (ref 26.0–34.0)
MCHC: 31.8 g/dL (ref 30.0–36.0)
MCV: 93.8 fL (ref 80.0–100.0)
Platelets: 168 10*3/uL (ref 150–400)
RBC: 2.58 MIL/uL — ABNORMAL LOW (ref 4.22–5.81)
RDW: 16.6 % — ABNORMAL HIGH (ref 11.5–15.5)
WBC: 4.5 10*3/uL (ref 4.0–10.5)
nRBC: 0 % (ref 0.0–0.2)

## 2020-04-18 LAB — RENAL FUNCTION PANEL
Albumin: 2.6 g/dL — ABNORMAL LOW (ref 3.5–5.0)
Anion gap: 13 (ref 5–15)
BUN: 80 mg/dL — ABNORMAL HIGH (ref 8–23)
CO2: 21 mmol/L — ABNORMAL LOW (ref 22–32)
Calcium: 6.4 mg/dL — CL (ref 8.9–10.3)
Chloride: 108 mmol/L (ref 98–111)
Creatinine, Ser: 9.37 mg/dL — ABNORMAL HIGH (ref 0.61–1.24)
GFR calc Af Amer: 6 mL/min — ABNORMAL LOW (ref >60)
GFR calc non Af Amer: 5 mL/min — ABNORMAL LOW (ref >60)
Glucose, Bld: 92 mg/dL (ref 70–99)
Phosphorus: 5.4 mg/dL — ABNORMAL HIGH (ref 2.5–4.6)
Potassium: 3.8 mmol/L (ref 3.5–5.1)
Sodium: 142 mmol/L (ref 135–145)

## 2020-04-18 MED ORDER — DARBEPOETIN ALFA 100 MCG/0.5ML IJ SOSY: 100.0000 ug | PREFILLED_SYRINGE | INTRAMUSCULAR | Status: DC

## 2020-04-18 MED ORDER — CEFAZOLIN SODIUM-DEXTROSE 2-4 GM/100ML-% IV SOLN
2.0000 g | INTRAVENOUS | Status: AC
  Administered 2020-04-19: 2 g via INTRAVENOUS
  Filled 2020-04-18: qty 100

## 2020-04-18 MED ORDER — CALCIUM GLUCONATE-NACL 1-0.675 GM/50ML-% IV SOLN
1.0000 g | Freq: Once | INTRAVENOUS | Status: AC
  Administered 2020-04-18: 1000 mg via INTRAVENOUS
  Filled 2020-04-18: qty 50

## 2020-04-18 MED ORDER — CHLORHEXIDINE GLUCONATE CLOTH 2 % EX PADS: 6.0000 | MEDICATED_PAD | Freq: Every day | CUTANEOUS | Status: DC

## 2020-04-18 NOTE — Progress Notes (Signed)
   VASCULAR SURGERY ASSESSMENT & PLAN:   STAGE V CHRONIC KIDNEY DISEASE: Based on his vein map it looks like his best chance for a fistula would be a basilic vein transposition on the right.  I explained that if we do this this would be done in 2 stages.  If the vein is not adequate he would likely require placement of an AV graft.  He will also have placement of a tunneled dialysis catheter.  I have previously discussed the indications for the procedure and the potential complications and he is agreeable to proceed.  He is scheduled for surgery tomorrow.  His IV has been removed from the right arm.  I have written preop orders.  SUBJECTIVE:   No complaints this morning.  PHYSICAL EXAM:   Vitals:   04/17/20 0540 04/17/20 1328 04/17/20 1651 04/17/20 2200  BP: (!) 128/59 136/66 107/63 (!) 159/74  Pulse: 74 72 76 86  Resp: 16     Temp: 98.8 F (37.1 C) 98.4 F (36.9 C) 98.5 F (36.9 C) 98.5 F (36.9 C)  TempSrc: Oral Oral Oral Oral  SpO2: 99%  98% 100%  Weight:      Height:       Palpable right radial pulse. His IV has been removed from the right forearm.  LABS:   Lab Results  Component Value Date   WBC 4.5 04/18/2020   HGB 7.7 (L) 04/18/2020   HCT 24.2 (L) 04/18/2020   MCV 93.8 04/18/2020   PLT 168 04/18/2020   Lab Results  Component Value Date   CREATININE 9.37 (H) 04/18/2020   No results found for: INR, PROTIME CBG (last 3)  No results for input(s): GLUCAP in the last 72 hours.  PROBLEM LIST:    Principal Problem:   Acute on chronic renal failure (HCC) Active Problems:   Chronic obstructive pulmonary disease (HCC)   Hypertension secondary to other renal disorders   Benign prostatic hyperplasia with urinary retention   Anemia due to chronic kidney disease   Hyperkalemia   Hypocalcemia   CURRENT MEDS:   . amLODipine  5 mg Oral Daily  . calcitRIOL  0.5 mcg Oral Daily  . calcium acetate  1,334 mg Oral TID WC  . calcium carbonate  1,000 mg of elemental  calcium Oral QHS  . darbepoetin (ARANESP) injection - NON-DIALYSIS  40 mcg Subcutaneous Q Sun-1800  . heparin  5,000 Units Subcutaneous Q8H  . metoprolol tartrate  25 mg Oral BID  . tamsulosin  0.4 mg Oral Daily  . umeclidinium bromide  1 puff Inhalation Daily    Deitra Mayo Office: [phone] 04/18/2020

## 2020-04-18 NOTE — Progress Notes (Addendum)
Patient ID: Kerry Underwood, male   DOB: Jun 21, 1951, 69 y.o.   MRN: [id number] S:    Appreciate VVS-  On deck to get AVF and [facility] tomorrow.  BUN and crt poor but stable.  He is not sure he wants to do this "but I better"    O:BP (!) 159/74   Pulse 86   Temp 98.5 F (36.9 C) (Oral)   Resp 16   Ht 5\' 5"  (1.651 m)   Wt 79.2 kg   SpO2 100%   BMI 29.06 kg/m   Intake/Output Summary (Last 24 hours) at 04/18/2020 1231 Last data filed at 04/18/2020 1000 Gross per 24 hour  Intake 1356.58 ml  Output 550 ml  Net 806.58 ml   Intake/Output: I/O last 3 completed shifts: In: 2615 [P.O.:180; I.V.:2435] Out: 1525 [Urine:1525]  Intake/Output this shift:  Total I/O In: 240 [P.O.:240] Out: -  Weight change:  Gen: NAD CVS: RRR, no rub Resp: cta Abd: +BS, soft, NT/ND Ext: no edema  Recent Labs  Lab 04/15/20 1246 04/16/20 0700 04/17/20 0332 04/18/20 0340  NA 143 142 144 142  K 5.7* 4.8 4.3 3.8  CL 112* 114* 111 108  CO2 16* 15* 17* 21*  GLUCOSE 103* 90 91 92  BUN 86* 83* 81* 80*  CREATININE 10.08* 9.85* 9.00* 9.37*  ALBUMIN 3.7 2.9* 2.7* 2.6*  CALCIUM 6.4* 5.9* 6.6* 6.4*  PHOS  --  8.7* 7.1* 5.4*  AST 25 22  --   --   ALT 17 15  --   --    Liver Function Tests: Recent Labs  Lab 04/15/20 1246 04/15/20 1246 04/16/20 0700 04/17/20 0332 04/18/20 0340  AST 25  --  22  --   --   ALT 17  --  15  --   --   ALKPHOS 105  --  84  --   --   BILITOT 0.5  --  0.3  --   --   PROT 7.5  --  6.1*  --   --   ALBUMIN 3.7   < > 2.9* 2.7* 2.6*   < > = values in this interval not displayed.   No results for input(s): LIPASE, AMYLASE in the last 168 hours. No results for input(s): AMMONIA in the last 168 hours. CBC: Recent Labs  Lab 04/15/20 1246 04/16/20 0700 04/18/20 0340  WBC 5.7 5.4 4.5  NEUTROABS 2.9  --   --   HGB 7.6* 8.1* 7.7*  HCT 25.1* 26.0* 24.2*  MCV 102.9* 96.3 93.8  PLT 267 211 168   Cardiac Enzymes: No results for input(s): CKTOTAL, CKMB, CKMBINDEX, TROPONINI in the  last 168 hours. CBG: No results for input(s): GLUCAP in the last 168 hours.  Iron Studies:  Recent Labs    04/16/20 0700  IRON 126  TIBC 287  FERRITIN 67   Studies/Results: VAS [place] UPPER EXTREMITY ARTERIAL DUPLEX  Result Date: 04/17/2020 UPPER EXTREMITY DUPLEX STUDY Indications: Patient complains of for avf/avg Tuesday next week.  Risk Factors: Hypertension. Comparison Study: no prior Performing Technologist: Abram Sander RVS  Examination Guidelines: A complete evaluation includes B-mode imaging, spectral Doppler, color Doppler, and power Doppler as needed of all accessible portions of each vessel. Bilateral testing is considered an integral part of a complete examination. Limited examinations for reoccurring indications may be performed as noted.  Right Doppler Findings: +---------------+----------+---------+--------+--------+ Site           PSV (cm/s)Waveform StenosisComments +---------------+----------+---------+--------+--------+ Subclavian Prox81  triphasic                 +---------------+----------+---------+--------+--------+                                                    +---------------+----------+---------+--------+--------+ Axillary       154.00    triphasic                 +---------------+----------+---------+--------+--------+ Brachial Prox  115       triphasic                 +---------------+----------+---------+--------+--------+ Brachial Mid   123       triphasic                 +---------------+----------+---------+--------+--------+ Brachial Dist  97        triphasic                 +---------------+----------+---------+--------+--------+ Radial Dist    165       triphasic                 +---------------+----------+---------+--------+--------+ Ulnar Dist     111       triphasic                 +---------------+----------+---------+--------+--------+  Left Doppler Findings:  +---------------+----------+---------+--------+--------+ Site           PSV (cm/s)Waveform StenosisComments +---------------+----------+---------+--------+--------+ Subclavian Prox114       triphasic                 +---------------+----------+---------+--------+--------+ Axillary       82.00     triphasic                 +---------------+----------+---------+--------+--------+ Brachial Prox  103       triphasic                 +---------------+----------+---------+--------+--------+ Brachial Mid   73        triphasic                 +---------------+----------+---------+--------+--------+ Brachial Dist  86        triphasic                 +---------------+----------+---------+--------+--------+ Radial Dist    62        triphasic                 +---------------+----------+---------+--------+--------+ Ulnar Dist     166       triphasic                 +---------------+----------+---------+--------+--------+   Summary:  Right: No obstruction visualized in the right upper extremity. Left: No obstruction visualized in the left upper extremity. *See table(s) above for measurements and observations. Electronically signed by Deitra Mayo MD on 04/17/2020 at 7:21:17 AM.    Final    VAS [place] UPPER EXT VEIN MAPPING (PRE-OP AVF)  Result Date: 04/17/2020 UPPER EXTREMITY VEIN MAPPING  Indications: History of PAD; patient is pre-operative for bypass. Comparison Study: no prior Performing Technologist: Abram Sander RVS  Examination Guidelines: A complete evaluation includes B-mode imaging, spectral Doppler, color Doppler, and power Doppler as needed of all accessible portions of each vessel. Bilateral testing is considered an integral part of a complete examination. Limited examinations for reoccurring indications may be performed as noted. +-----------------+-------------+----------+--------------+  Right Cephalic   Diameter (cm)Depth (cm)   Findings     +-----------------+-------------+----------+--------------+ Shoulder             0.24        1.07                  +-----------------+-------------+----------+--------------+ Prox upper arm       0.20        0.66                  +-----------------+-------------+----------+--------------+ Mid upper arm        0.22        0.57                  +-----------------+-------------+----------+--------------+ Dist upper arm       0.23        0.71     branching    +-----------------+-------------+----------+--------------+ Antecubital fossa    0.32        0.58                  +-----------------+-------------+----------+--------------+ Prox forearm         0.32        0.62                  +-----------------+-------------+----------+--------------+ Mid forearm                               branching    +-----------------+-------------+----------+--------------+ Dist forearm                             iv bandages   +-----------------+-------------+----------+--------------+ Wrist                                   not visualized +-----------------+-------------+----------+--------------+ +-----------------+-------------+----------+------------------------------+ Right Basilic    Diameter (cm)Depth (cm)           Findings            +-----------------+-------------+----------+------------------------------+ Prox upper arm       0.36        1.24                                  +-----------------+-------------+----------+------------------------------+ Mid upper arm        0.42        0.84                                  +-----------------+-------------+----------+------------------------------+ Dist upper arm       0.48        0.78                                  +-----------------+-------------+----------+------------------------------+ Antecubital fossa    0.30        0.40                                   +-----------------+-------------+----------+------------------------------+ Prox forearm         0.46        0.50  branching            +-----------------+-------------+----------+------------------------------+ Mid forearm                             not visualized and iv bandages +-----------------+-------------+----------+------------------------------+ Distal forearm                          not visualized and iv bandages +-----------------+-------------+----------+------------------------------+ Elbow                                           not visualized         +-----------------+-------------+----------+------------------------------+ Wrist                                           not visualized         +-----------------+-------------+----------+------------------------------+ +-----------------+-------------+----------+-----------+ Left Cephalic    Diameter (cm)Depth (cm) Findings   +-----------------+-------------+----------+-----------+ Shoulder             0.26        0.62               +-----------------+-------------+----------+-----------+ Prox upper arm       0.18        0.72               +-----------------+-------------+----------+-----------+ Mid upper arm        0.18        0.73               +-----------------+-------------+----------+-----------+ Dist upper arm       0.24        0.69               +-----------------+-------------+----------+-----------+ Antecubital fossa    0.53        0.39               +-----------------+-------------+----------+-----------+ Prox forearm         0.27        0.40               +-----------------+-------------+----------+-----------+ Mid forearm          0.37        0.38               +-----------------+-------------+----------+-----------+ Dist forearm                            iv bandages +-----------------+-------------+----------+-----------+  +-----------------+-------------+----------+--------------+ Left Basilic     Diameter (cm)Depth (cm)   Findings    +-----------------+-------------+----------+--------------+ Prox upper arm       0.43        1.12                  +-----------------+-------------+----------+--------------+ Mid upper arm        0.43        1.39                  +-----------------+-------------+----------+--------------+ Dist upper arm       0.22        0.99                  +-----------------+-------------+----------+--------------+ Antecubital fossa  0.24        0.63                  +-----------------+-------------+----------+--------------+ Prox forearm                            not visualized +-----------------+-------------+----------+--------------+ Mid forearm                              iv bandages   +-----------------+-------------+----------+--------------+ Distal forearm                           iv bandages   +-----------------+-------------+----------+--------------+ *See table(s) above for measurements and observations.  Diagnosing physician: Deitra Mayo MD Electronically signed by Deitra Mayo MD on 04/17/2020 at 7:21:26 AM.    Final    . amLODipine  5 mg Oral Daily  . calcitRIOL  0.5 mcg Oral Daily  . calcium acetate  1,334 mg Oral TID WC  . calcium carbonate  1,000 mg of elemental calcium Oral QHS  . darbepoetin (ARANESP) injection - NON-DIALYSIS  40 mcg Subcutaneous Q Sun-1800  . heparin  5,000 Units Subcutaneous Q8H  . metoprolol tartrate  25 mg Oral BID  . tamsulosin  0.4 mg Oral Daily  . umeclidinium bromide  1 puff Inhalation Daily    BMET    Component Value Date/Time   NA 142 04/18/2020 0340   K 3.8 04/18/2020 0340   CL 108 04/18/2020 0340   CO2 21 (L) 04/18/2020 0340   GLUCOSE 92 04/18/2020 0340   BUN 80 (H) 04/18/2020 0340   CREATININE 9.37 (H) 04/18/2020 0340   CALCIUM 6.4 (LL) 04/18/2020 0340   GFRNONAA 5 (L) 04/18/2020 0340    GFRAA 6 (L) 04/18/2020 0340   CBC    Component Value Date/Time   WBC 4.5 04/18/2020 0340   RBC 2.58 (L) 04/18/2020 0340   HGB 7.7 (L) 04/18/2020 0340   HCT 24.2 (L) 04/18/2020 0340   PLT 168 04/18/2020 0340   MCV 93.8 04/18/2020 0340   MCH 29.8 04/18/2020 0340   MCHC 31.8 04/18/2020 0340   RDW 16.6 (H) 04/18/2020 0340   LYMPHSABS 0.8 04/15/2020 1246   MONOABS 0.4 04/15/2020 1246   EOSABS 1.6 (H) 04/15/2020 1246   BASOSABS 0.0 04/15/2020 1246    Assessment/Plan: 1. 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 Progressive CKD stage V-  Feel is more consistent with CKD and actually ESRD.  mild uremic symptoms.  Will need vascular access placement with AVF, likely will also require a TDC as renal function has not improved 1. 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 We discussed different modalities of RRT including HD, and PD. He does not have anyone at home for HHD. He would be amenable to HD if needed. 3. Consulted VVS to evaluate for vein mapping and TDC for impending need of dialysis.  Scheduled for 04/19/20 and will initiate HD afterwards. 2. Hypocalcemia- presumably due to secondary HPTH. Will check vit D levels and iPTH. Given oral calcium gluconate and started on phoslo for hypercalcemia.   1. Gave dose of IV calcium  2. started calcitriol 0.5 mcg daily.  iPTH 207 3. Also on phoslo with meals.  Corrected is better and he is not having sxms  3. Hyperkalemia- improved  4. Anemia of CKD-  iron stores OK, on ESA- needs  inc dose. PRBC's prn 5. HTN- poorly controlled due to noncompliance with medications. To resume previous outpatient medications, amlodipine and metoprolol. 6. Pre-syncope/dizziness- possibly related to significant anemia.  7. Metabolic acidosis- improved-  Will improve further with HD 8. 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 BPH- with nocturia x 6.  restarting flomax. 10. dCHF- appears volume depleted on  exam.  11. COPD- stable.  12. Tobacco abuse- ongoing.  Louis Meckel  [employer] [phone]

## 2020-04-18 NOTE — Progress Notes (Addendum)
Critical Calcium of 6.4 at this time. Latest value was at 6.6 yesterday am. RN messaged on call to make aware  Eleanora Neighbor, RN

## 2020-04-18 NOTE — Progress Notes (Signed)
Pt is refusing bed alarm. Pt states he will call if he needs to get up.   Eleanora Neighbor, RN

## 2020-04-18 NOTE — Progress Notes (Signed)
Progress Note    Kerry Underwood  NLG:[id number] DOB: 1951-08-14  DOA: 04/15/2020 PCP: Patient, No Pcp Per    Brief Narrative:    Medical records reviewed and are as summarized below:  Kerry Underwood is an 69 y.o. male with a history of chronic kidney disease, COPD,  hypertension.  Patient has not seen a physician over the past year and has not been taking his medications for the past year.  Over the past couple weeks, he has been increasingly tired, fatigued, with shortness of breath with activity and feeling like he is going to pass out.  Today, he did pass out and was brought to the hospital by family member.   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   surgery on 5/11 -dialysis per renal -IV fluids per renal  Hypertension -Amlodipine and metoprolol  Hyperkalemia -s/p lokelma -monitor closely  Hypocalcemia -Patient is status post IV calcium -Calcitriol was started by nephrology -PTH elevated and vit D low  Anemia of chronic kidney disease -patient was Symptomatic - s/p 2 units -Hgb trending down, may need further transfusions -daily labs  BPH -Flomax  COPD -Compensated -continue to monitor  Itching  - atarax or sarna   Family Communication/Anticipated D/C date and plan/Code Status   DVT prophylaxis: Heparin Code Status: DNR Disposition Plan: Status is: Inpatient  Remains inpatient appropriate because:Ongoing diagnostic testing needed not appropriate for outpatient work up   Dispo: The patient  is from: Home              Anticipated d/c is to: Home              Anticipated d/c date is: undetermined due to need for procedures etc              Patient currently is not medically stable to d/c.  Patient  need dialysis and access placed   Medical Consultants:    Renal  Vascular surgery     Subjective:   Says he is making urine-- getting up several times during the night  Objective:    Vitals:   04/17/20 0540 04/17/20 1328 04/17/20 1651 04/17/20 2200  BP: (!) 128/59 136/66 107/63 (!) 159/74  Pulse: 74 72 76 86  Resp: 16     Temp: 98.8 F (37.1 C) 98.4 F (36.9 C) 98.5 F (36.9 C) 98.5 F (36.9 C)  TempSrc: Oral Oral Oral Oral  SpO2: 99%  98% 100%  Weight:      Height:        Intake/Output Summary (Last 24 hours) at 04/18/2020 0815 Last data filed at 04/18/2020 0451 Gross per 24 hour  Intake 1287.97 ml  Output 550 ml  Net 737.97 ml   Filed Weights   04/15/20 1054 04/15/20 1830 04/16/20 2226  Weight: 75.3 kg 77.3 kg 79.2 kg    Exam:  General: Appearance:     chronically ill male in no acute distress     Lungs:     Clear to auscultation bilaterally, respirations unlabored  Heart:    Normal heart rate.  Normal rhythm. No murmurs, rubs, or gallops.   MS:   All extremities are intact.   Neurologic:   Awake, alert, oriented x 3. No apparent focal neurological           defect.     Data Reviewed:   I have personally reviewed following labs and imaging studies:  Labs: Labs show the following:   Basic Metabolic Panel: Recent Labs  Lab 04/15/20 1246 04/15/20 1246 04/16/20 0700 04/16/20 0700 04/17/20 0332 04/18/20 0340  NA 143  --  142  --  144 142  K 5.7*   < > 4.8   < > 4.3 3.8  CL 112*  --  114*  --  111 108  CO2 16*  --  15*  --  17* 21*  GLUCOSE 103*  --  90  --  91 92  BUN 86*  --  83*  --  81* 80*  CREATININE 10.08*  --  9.85*  --  9.00* 9.37*  CALCIUM 6.4*  --  5.9*  --  6.6* 6.4*  PHOS  --   --  8.7*  --  7.1* 5.4*   < > = values in  this interval not displayed.   GFR Estimated Creatinine Clearance: 7.1 mL/min (A) (by C-G formula based on SCr of 9.37 mg/dL (H)). Liver Function Tests: Recent Labs  Lab 04/15/20 1246 04/16/20 0700 04/17/20 0332 04/18/20 0340  AST 25 22  --   --   ALT 17 15  --   --   ALKPHOS 105 84  --   --   BILITOT 0.5 0.3  --   --   PROT 7.5 6.1*  --   --   ALBUMIN 3.7 2.9* 2.7* 2.6*   No results for input(s): LIPASE, AMYLASE in the last 168 hours. No results for input(s): AMMONIA in the last 168 hours. Coagulation profile No results for input(s): INR, PROTIME in the last 168 hours.  CBC: Recent Labs  Lab 04/15/20 1246 04/16/20 0700 04/18/20 0340  WBC 5.7 5.4 4.5  NEUTROABS 2.9  --   --   HGB 7.6* 8.1* 7.7*  HCT 25.1* 26.0* 24.2*  MCV 102.9* 96.3 93.8  PLT 267 211 168   Cardiac Enzymes: No results for input(s): CKTOTAL, CKMB, CKMBINDEX, TROPONINI in the last 168 hours. BNP (last 3 results) No results for input(s): PROBNP in the last 8760 hours. CBG: No results for input(s): GLUCAP in the last 168 hours. D-Dimer: No results for input(s): DDIMER in the last 72 hours. Hgb A1c: No results for input(s): HGBA1C in the last 72 hours. Lipid Profile: No results for input(s): CHOL, HDL, LDLCALC, TRIG, CHOLHDL, LDLDIRECT in the last 72 hours. Thyroid function studies: No results for input(s): TSH, T4TOTAL, T3FREE, THYROIDAB in the last 72 hours.  Invalid input(s): FREET3 Anemia work up: Recent Labs    04/16/20 0700  VITAMINB12 225  FERRITIN 67  TIBC 287  IRON 126   Sepsis Labs: Recent Labs  Lab 04/15/20 1246 04/16/20 0700 04/18/20 0340  WBC 5.7 5.4 4.5    Microbiology Recent Results (from the past 240 hour(s))  Respiratory Panel by RT PCR (Flu A&B, Covid) - Nasopharyngeal Swab     Status: None   Collection Time: 04/15/20  2:54 PM   Specimen: Nasopharyngeal Swab  Result Value Ref Range Status   SARS Coronavirus 2 by RT PCR NEGATIVE NEGATIVE Final    Comment:  (NOTE) SARS-CoV-2 target nucleic acids are NOT DETECTED. The SARS-CoV-2 RNA is  generally detectable in upper respiratoy specimens during the acute phase of infection. The lowest concentration of SARS-CoV-2 viral copies this assay can detect is 131 copies/mL. A negative result does not preclude SARS-Cov-2 infection and should not be used as the sole basis for treatment or other patient management decisions. A negative result may occur with  improper specimen collection/handling, submission of specimen other than nasopharyngeal swab, presence of viral mutation(s) within the areas targeted by this assay, and inadequate number of viral copies (<131 copies/mL). A negative result must be combined with clinical observations, patient history, and epidemiological information. The expected result is Negative. Fact Sheet for Patients:  PinkCheek.be Fact Sheet for Healthcare Providers:  GravelBags.it This test is not yet ap proved or cleared by the [place] FDA and  has been authorized for detection and/or diagnosis of SARS-CoV-2 by FDA under an Emergency Use Authorization (EUA). This EUA will remain  in effect (meaning this test can be used) for the duration of the COVID-19 declaration under Section 564(b)(1) of the Act, 21 U.S.C. section 360bbb-3(b)(1), unless the authorization is terminated or revoked sooner.    Influenza A by PCR NEGATIVE NEGATIVE Final   Influenza B by PCR NEGATIVE NEGATIVE Final    Comment: (NOTE) The Xpert Xpress SARS-CoV-2/FLU/RSV assay is intended as an aid in  the diagnosis of influenza from Nasopharyngeal swab specimens and  should not be used as a sole basis for treatment. Nasal washings and  aspirates are unacceptable for Xpert Xpress SARS-CoV-2/FLU/RSV  testing. Fact Sheet for Patients: PinkCheek.be Fact Sheet for Healthcare  Providers: GravelBags.it This test is not yet approved or cleared by the [place] FDA and  has been authorized for detection and/or diagnosis of SARS-CoV-2 by  FDA under an Emergency Use Authorization (EUA). This EUA will remain  in effect (meaning this test can be used) for the duration of the  Covid-19 declaration under Section 564(b)(1) of the Act, 21  U.S.C. section 360bbb-3(b)(1), unless the authorization is  terminated or revoked. Performed at [facility], [address], [place], [place] [address]     Procedures and diagnostic studies:  VAS [place] UPPER EXTREMITY ARTERIAL DUPLEX  Result Date: 04/17/2020 UPPER EXTREMITY DUPLEX STUDY Indications: Patient complains of for avf/avg Tuesday next week.  Risk Factors: Hypertension. Comparison Study: no prior Performing Technologist: Abram Sander RVS  Examination Guidelines: A complete evaluation includes B-mode imaging, spectral Doppler, color Doppler, and power Doppler as needed of all accessible portions of each vessel. Bilateral testing is considered an integral part of a complete examination. Limited examinations for reoccurring indications may be performed as noted.  Right Doppler Findings: +---------------+----------+---------+--------+--------+ Site           PSV (cm/s)Waveform StenosisComments +---------------+----------+---------+--------+--------+ Subclavian Prox81        triphasic                 +---------------+----------+---------+--------+--------+                                                    +---------------+----------+---------+--------+--------+ Axillary       154.00    triphasic                 +---------------+----------+---------+--------+--------+ Brachial Prox  115       triphasic                 +---------------+----------+---------+--------+--------+  Brachial Mid   123       triphasic                 +---------------+----------+---------+--------+--------+  Brachial Dist  97        triphasic                 +---------------+----------+---------+--------+--------+ Radial Dist    165       triphasic                 +---------------+----------+---------+--------+--------+ Ulnar Dist     111       triphasic                 +---------------+----------+---------+--------+--------+  Left Doppler Findings: +---------------+----------+---------+--------+--------+ Site           PSV (cm/s)Waveform StenosisComments +---------------+----------+---------+--------+--------+ Subclavian Prox114       triphasic                 +---------------+----------+---------+--------+--------+ Axillary       82.00     triphasic                 +---------------+----------+---------+--------+--------+ Brachial Prox  103       triphasic                 +---------------+----------+---------+--------+--------+ Brachial Mid   73        triphasic                 +---------------+----------+---------+--------+--------+ Brachial Dist  86        triphasic                 +---------------+----------+---------+--------+--------+ Radial Dist    62        triphasic                 +---------------+----------+---------+--------+--------+ Ulnar Dist     166       triphasic                 +---------------+----------+---------+--------+--------+   Summary:  Right: No obstruction visualized in the right upper extremity. Left: No obstruction visualized in the left upper extremity. *See table(s) above for measurements and observations. Electronically signed by Deitra Mayo MD on 04/17/2020 at 7:21:17 AM.    Final    VAS [place] UPPER EXT VEIN MAPPING (PRE-OP AVF)  Result Date: 04/17/2020 UPPER EXTREMITY VEIN MAPPING  Indications: History of PAD; patient is pre-operative for bypass. Comparison Study: no prior Performing Technologist: Abram Sander RVS  Examination Guidelines: A complete evaluation includes B-mode imaging, spectral Doppler, color  Doppler, and power Doppler as needed of all accessible portions of each vessel. Bilateral testing is considered an integral part of a complete examination. Limited examinations for reoccurring indications may be performed as noted. +-----------------+-------------+----------+--------------+ Right Cephalic   Diameter (cm)Depth (cm)   Findings    +-----------------+-------------+----------+--------------+ Shoulder             0.24        1.07                  +-----------------+-------------+----------+--------------+ Prox upper arm       0.20        0.66                  +-----------------+-------------+----------+--------------+ Mid upper arm        0.22        0.57                  +-----------------+-------------+----------+--------------+  Dist upper arm       0.23        0.71     branching    +-----------------+-------------+----------+--------------+ Antecubital fossa    0.32        0.58                  +-----------------+-------------+----------+--------------+ Prox forearm         0.32        0.62                  +-----------------+-------------+----------+--------------+ Mid forearm                               branching    +-----------------+-------------+----------+--------------+ Dist forearm                             iv bandages   +-----------------+-------------+----------+--------------+ Wrist                                   not visualized +-----------------+-------------+----------+--------------+ +-----------------+-------------+----------+------------------------------+ Right Basilic    Diameter (cm)Depth (cm)           Findings            +-----------------+-------------+----------+------------------------------+ Prox upper arm       0.36        1.24                                  +-----------------+-------------+----------+------------------------------+ Mid upper arm        0.42        0.84                                   +-----------------+-------------+----------+------------------------------+ Dist upper arm       0.48        0.78                                  +-----------------+-------------+----------+------------------------------+ Antecubital fossa    0.30        0.40                                  +-----------------+-------------+----------+------------------------------+ Prox forearm         0.46        0.50             branching            +-----------------+-------------+----------+------------------------------+ Mid forearm                             not visualized and iv bandages +-----------------+-------------+----------+------------------------------+ Distal forearm                          not visualized and iv bandages +-----------------+-------------+----------+------------------------------+ Elbow  not visualized         +-----------------+-------------+----------+------------------------------+ Wrist                                           not visualized         +-----------------+-------------+----------+------------------------------+ +-----------------+-------------+----------+-----------+ Left Cephalic    Diameter (cm)Depth (cm) Findings   +-----------------+-------------+----------+-----------+ Shoulder             0.26        0.62               +-----------------+-------------+----------+-----------+ Prox upper arm       0.18        0.72               +-----------------+-------------+----------+-----------+ Mid upper arm        0.18        0.73               +-----------------+-------------+----------+-----------+ Dist upper arm       0.24        0.69               +-----------------+-------------+----------+-----------+ Antecubital fossa    0.53        0.39               +-----------------+-------------+----------+-----------+ Prox forearm         0.27        0.40                +-----------------+-------------+----------+-----------+ Mid forearm          0.37        0.38               +-----------------+-------------+----------+-----------+ Dist forearm                            iv bandages +-----------------+-------------+----------+-----------+ +-----------------+-------------+----------+--------------+ Left Basilic     Diameter (cm)Depth (cm)   Findings    +-----------------+-------------+----------+--------------+ Prox upper arm       0.43        1.12                  +-----------------+-------------+----------+--------------+ Mid upper arm        0.43        1.39                  +-----------------+-------------+----------+--------------+ Dist upper arm       0.22        0.99                  +-----------------+-------------+----------+--------------+ Antecubital fossa    0.24        0.63                  +-----------------+-------------+----------+--------------+ Prox forearm                            not visualized +-----------------+-------------+----------+--------------+ Mid forearm                              iv bandages   +-----------------+-------------+----------+--------------+ Distal forearm  iv bandages   +-----------------+-------------+----------+--------------+ *See table(s) above for measurements and observations.  Diagnosing physician: Deitra Mayo MD Electronically signed by Deitra Mayo MD on 04/17/2020 at 7:21:26 AM.    Final     Medications:   . amLODipine  5 mg Oral Daily  . calcitRIOL  0.5 mcg Oral Daily  . calcium acetate  1,334 mg Oral TID WC  . calcium carbonate  1,000 mg of elemental calcium Oral QHS  . darbepoetin (ARANESP) injection - NON-DIALYSIS  40 mcg Subcutaneous Q Sun-1800  . heparin  5,000 Units Subcutaneous Q8H  . metoprolol tartrate  25 mg Oral BID  . tamsulosin  0.4 mg Oral Daily  . umeclidinium bromide  1 puff Inhalation Daily   Continuous  Infusions: . [START ON 04/19/2020]  ceFAZolin (ANCEF) IV    .  sodium bicarbonate (isotonic) infusion in sterile water 75 mL/hr at 04/18/20 0729     LOS: 3 days   Geradine Girt  [facility]   How to contact the [facility] Attending or Consulting provider [place] or covering provider during after hours [place], for this patient?  1. Check the care team in [facility] and look for a) attending/consulting [facility] provider listed and b) the [facility] team listed 2. Log into [email or web address] and use [place]'s universal password to access. If you do not have the password, please contact the hospital operator. 3. Locate the [facility] provider you are looking for under [facility] and page to a number that you can be directly reached. 4. If you still have difficulty reaching the provider, please page the [facility] (Director on Call) for the Hospitalists listed on amion for assistance.  04/18/2020, 8:15 AM

## 2020-04-19 ENCOUNTER — Inpatient Hospital Stay (HOSPITAL_COMMUNITY): Payer: Medicare Other

## 2020-04-19 ENCOUNTER — Encounter (HOSPITAL_COMMUNITY): Payer: Self-pay | Admitting: Certified Registered Nurse Anesthetist

## 2020-04-19 ENCOUNTER — Encounter (HOSPITAL_COMMUNITY)
Admission: EM | Disposition: A | Discharge status: Home / Self Care | Payer: Self-pay | Source: Home / Self Care | Attending: Internal Medicine

## 2020-04-19 ENCOUNTER — Inpatient Hospital Stay (HOSPITAL_COMMUNITY): Payer: Medicare Other | Admitting: Certified Registered Nurse Anesthetist

## 2020-04-19 HISTORY — PX: INSERTION OF DIALYSIS CATHETER: SHX1324

## 2020-04-19 HISTORY — PX: BASCILIC VEIN TRANSPOSITION: SHX5742

## 2020-04-19 LAB — RENAL FUNCTION PANEL
Albumin: 2.5 g/dL — ABNORMAL LOW (ref 3.5–5.0)
Anion gap: 13 (ref 5–15)
BUN: 78 mg/dL — ABNORMAL HIGH (ref 8–23)
CO2: 24 mmol/L (ref 22–32)
Calcium: 6.2 mg/dL — CL (ref 8.9–10.3)
Chloride: 105 mmol/L (ref 98–111)
Creatinine, Ser: 9.52 mg/dL — ABNORMAL HIGH (ref 0.61–1.24)
GFR calc Af Amer: 6 mL/min — ABNORMAL LOW (ref >60)
GFR calc non Af Amer: 5 mL/min — ABNORMAL LOW (ref >60)
Glucose, Bld: 87 mg/dL (ref 70–99)
Phosphorus: 4.7 mg/dL — ABNORMAL HIGH (ref 2.5–4.6)
Potassium: 3.7 mmol/L (ref 3.5–5.1)
Sodium: 142 mmol/L (ref 135–145)

## 2020-04-19 LAB — PROTEIN ELECTROPHORESIS, SERUM
A/G Ratio: 1.1 (ref 0.7–1.7)
Albumin ELP: 2.9 g/dL (ref 2.9–4.4)
Alpha-1-Globulin: 0.2 g/dL (ref 0.0–0.4)
Alpha-2-Globulin: 0.8 g/dL (ref 0.4–1.0)
Beta Globulin: 0.8 g/dL (ref 0.7–1.3)
Gamma Globulin: 0.9 g/dL (ref 0.4–1.8)
Globulin, Total: 2.7 g/dL (ref 2.2–3.9)
Total Protein ELP: 5.6 g/dL — ABNORMAL LOW (ref 6.0–8.5)

## 2020-04-19 LAB — CBC
HCT: 26.6 % — ABNORMAL LOW (ref 39.0–52.0)
Hemoglobin: 8.4 g/dL — ABNORMAL LOW (ref 13.0–17.0)
MCH: 29.9 pg (ref 26.0–34.0)
MCHC: 31.6 g/dL (ref 30.0–36.0)
MCV: 94.7 fL (ref 80.0–100.0)
Platelets: 175 10*3/uL (ref 150–400)
RBC: 2.81 MIL/uL — ABNORMAL LOW (ref 4.22–5.81)
RDW: 16.1 % — ABNORMAL HIGH (ref 11.5–15.5)
WBC: 4.9 10*3/uL (ref 4.0–10.5)
nRBC: 0 % (ref 0.0–0.2)

## 2020-04-19 LAB — SURGICAL PCR SCREEN
MRSA, PCR: POSITIVE — AB
Staphylococcus aureus: POSITIVE — AB

## 2020-04-19 SURGERY — INSERTION OF DIALYSIS CATHETER
Laterality: Right

## 2020-04-19 MED ORDER — LIDOCAINE 2% (20 MG/ML) 5 ML SYRINGE
INTRAMUSCULAR | Status: DC | PRN
  Administered 2020-04-19: 50 mg via INTRAVENOUS

## 2020-04-19 MED ORDER — PHENYLEPHRINE 40 MCG/ML (10ML) SYRINGE FOR IV PUSH (FOR BLOOD PRESSURE SUPPORT)
PREFILLED_SYRINGE | INTRAVENOUS | Status: DC | PRN
  Administered 2020-04-19: 80 ug via INTRAVENOUS

## 2020-04-19 MED ORDER — HYDROCODONE-ACETAMINOPHEN 5-325 MG PO TABS: 1.0000 | ORAL_TABLET | ORAL | Status: DC | PRN

## 2020-04-19 MED ORDER — DIPHENHYDRAMINE HCL 50 MG/ML IJ SOLN
INTRAMUSCULAR | Status: DC | PRN
  Administered 2020-04-19 (×2): 12.5 mg via INTRAVENOUS

## 2020-04-19 MED ORDER — SODIUM CHLORIDE 0.9 % IV SOLN: INTRAVENOUS | Status: DC

## 2020-04-19 MED ORDER — PROPOFOL 500 MG/50ML IV EMUL
INTRAVENOUS | Status: DC | PRN
  Administered 2020-04-19: 50 ug/kg/min via INTRAVENOUS

## 2020-04-19 MED ORDER — CALCIUM GLUCONATE-NACL 2-0.675 GM/100ML-% IV SOLN
2.0000 g | Freq: Once | INTRAVENOUS | Status: AC
  Administered 2020-04-19: 2000 mg via INTRAVENOUS
  Filled 2020-04-19: qty 100

## 2020-04-19 MED ORDER — DEXAMETHASONE SODIUM PHOSPHATE 10 MG/ML IJ SOLN
INTRAMUSCULAR | Status: DC | PRN
  Administered 2020-04-19: 4 mg via INTRAVENOUS

## 2020-04-19 MED ORDER — FENTANYL CITRATE (PF) 100 MCG/2ML IJ SOLN
INTRAMUSCULAR | Status: DC | PRN
  Administered 2020-04-19 (×2): 50 ug via INTRAVENOUS

## 2020-04-19 MED ORDER — MUPIROCIN 2 % EX OINT
1.0000 "application " | TOPICAL_OINTMENT | Freq: Two times a day (BID) | CUTANEOUS | Status: DC
  Administered 2020-04-19 – 2020-04-20 (×4): 1 via NASAL
  Filled 2020-04-19 (×2): qty 22

## 2020-04-19 MED ORDER — EPHEDRINE SULFATE-NACL 50-0.9 MG/10ML-% IV SOSY
PREFILLED_SYRINGE | INTRAVENOUS | Status: DC | PRN
  Administered 2020-04-19 (×2): 10 mg via INTRAVENOUS

## 2020-04-19 MED ORDER — HEPARIN SODIUM (PORCINE) 1000 UNIT/ML IJ SOLN
INTRAMUSCULAR | Status: AC
  Filled 2020-04-19: qty 1

## 2020-04-19 MED ORDER — DIPHENHYDRAMINE HCL 50 MG/ML IJ SOLN
INTRAMUSCULAR | Status: AC
  Filled 2020-04-19: qty 1

## 2020-04-19 MED ORDER — PROPOFOL 10 MG/ML IV BOLUS
INTRAVENOUS | Status: DC | PRN
Start: 2020-04-19 — End: 2020-04-19
  Administered 2020-04-19: 20 mg via INTRAVENOUS
  Administered 2020-04-19: 80 mg via INTRAVENOUS

## 2020-04-19 MED ORDER — CEFAZOLIN SODIUM-DEXTROSE 2-4 GM/100ML-% IV SOLN
INTRAVENOUS | Status: AC
  Filled 2020-04-19: qty 100

## 2020-04-19 MED ORDER — HEPARIN SODIUM (PORCINE) 1000 UNIT/ML IJ SOLN
INTRAMUSCULAR | Status: DC | PRN
  Administered 2020-04-19: 3200 [IU]

## 2020-04-19 MED ORDER — 0.9 % SODIUM CHLORIDE (POUR BTL) OPTIME
TOPICAL | Status: DC | PRN
  Administered 2020-04-19: 13:00:00 1000 mL

## 2020-04-19 MED ORDER — PHENYLEPHRINE HCL-NACL 10-0.9 MG/250ML-% IV SOLN
INTRAVENOUS | Status: DC | PRN
  Administered 2020-04-19: 50 ug/min via INTRAVENOUS

## 2020-04-19 MED ORDER — ONDANSETRON HCL 4 MG/2ML IJ SOLN
INTRAMUSCULAR | Status: DC | PRN
  Administered 2020-04-19: 4 mg via INTRAVENOUS

## 2020-04-19 MED ORDER — CHLORHEXIDINE GLUCONATE CLOTH 2 % EX PADS
6.0000 | MEDICATED_PAD | Freq: Every day | CUTANEOUS | Status: DC
  Administered 2020-04-19 – 2020-04-20 (×2): 6 via TOPICAL

## 2020-04-19 MED ORDER — FENTANYL CITRATE (PF) 250 MCG/5ML IJ SOLN
INTRAMUSCULAR | Status: AC
  Filled 2020-04-19: qty 5

## 2020-04-19 MED ORDER — LIDOCAINE 2% (20 MG/ML) 5 ML SYRINGE
INTRAMUSCULAR | Status: AC
  Filled 2020-04-19: qty 5

## 2020-04-19 MED ORDER — LIDOCAINE-EPINEPHRINE (PF) 1 %-1:200000 IJ SOLN
INTRAMUSCULAR | Status: AC
  Filled 2020-04-19: qty 60

## 2020-04-19 MED ORDER — SODIUM CHLORIDE 0.9 % IV SOLN
INTRAVENOUS | Status: DC | PRN
  Administered 2020-04-19: 500 mL

## 2020-04-19 MED ORDER — SODIUM CHLORIDE 0.9 % IV SOLN
INTRAVENOUS | Status: AC
  Filled 2020-04-19: qty 1.2

## 2020-04-19 SURGICAL SUPPLY — 37 items
ARMBAND PINK RESTRICT EXTREMIT (MISCELLANEOUS) ×3 IMPLANT
BIOPATCH RED 1 DISK 7.0 (GAUZE/BANDAGES/DRESSINGS) ×2 IMPLANT
BIOPATCH RED 1IN DISK 7.0MM (GAUZE/BANDAGES/DRESSINGS) ×1
CANISTER SUCT 3000ML PPV (MISCELLANEOUS) ×3 IMPLANT
CATH PALINDROME-P 19CM W/VT (CATHETERS) ×3 IMPLANT
CLIP VESOCCLUDE MED 6/CT (CLIP) ×3 IMPLANT
CLIP VESOCCLUDE SM WIDE 6/CT (CLIP) ×3 IMPLANT
COVER PROBE W GEL 5X96 (DRAPES) ×3 IMPLANT
COVER WAND RF STERILE (DRAPES) IMPLANT
DERMABOND ADVANCED (GAUZE/BANDAGES/DRESSINGS) ×4
DERMABOND ADVANCED .7 DNX12 (GAUZE/BANDAGES/DRESSINGS) ×2 IMPLANT
DRSG COVADERM 4X6 (GAUZE/BANDAGES/DRESSINGS) ×3 IMPLANT
ELECT REM PT RETURN 9FT ADLT (ELECTROSURGICAL) ×3
ELECTRODE REM PT RTRN 9FT ADLT (ELECTROSURGICAL) ×1 IMPLANT
GLOVE BIO SURGEON STRL SZ 6.5 (GLOVE) ×4 IMPLANT
GLOVE BIO SURGEON STRL SZ7.5 (GLOVE) ×6 IMPLANT
GLOVE BIO SURGEONS STRL SZ 6.5 (GLOVE) ×2
GLOVE BIOGEL PI IND STRL 6.5 (GLOVE) ×1 IMPLANT
GLOVE BIOGEL PI INDICATOR 6.5 (GLOVE) ×2
GOWN STRL REUS W/ TWL LRG LVL3 (GOWN DISPOSABLE) ×2 IMPLANT
GOWN STRL REUS W/ TWL XL LVL3 (GOWN DISPOSABLE) ×2 IMPLANT
GOWN STRL REUS W/TWL LRG LVL3 (GOWN DISPOSABLE) ×4
GOWN STRL REUS W/TWL XL LVL3 (GOWN DISPOSABLE) ×4
INSERT FOGARTY SM (MISCELLANEOUS) ×3 IMPLANT
KIT BASIN OR (CUSTOM PROCEDURE TRAY) ×3 IMPLANT
KIT TURNOVER KIT B (KITS) ×3 IMPLANT
NS IRRIG 1000ML POUR BTL (IV SOLUTION) ×3 IMPLANT
PACK CV ACCESS (CUSTOM PROCEDURE TRAY) ×3 IMPLANT
PAD ARMBOARD 7.5X6 YLW CONV (MISCELLANEOUS) ×6 IMPLANT
SUT ETHILON 3 0 PS 1 (SUTURE) ×3 IMPLANT
SUT MNCRL AB 4-0 PS2 18 (SUTURE) ×9 IMPLANT
SUT PROLENE 6 0 BV (SUTURE) ×3 IMPLANT
SUT VIC AB 3-0 SH 27 (SUTURE) ×2
SUT VIC AB 3-0 SH 27X BRD (SUTURE) ×1 IMPLANT
TOWEL GREEN STERILE (TOWEL DISPOSABLE) ×3 IMPLANT
UNDERPAD 30X36 HEAVY ABSORB (UNDERPADS AND DIAPERS) ×3 IMPLANT
WATER STERILE IRR 1000ML POUR (IV SOLUTION) ×3 IMPLANT

## 2020-04-19 NOTE — Anesthesia Procedure Notes (Signed)
Procedure Name: LMA Insertion Date/Time: 04/19/2020 12:48 PM Performed by: Imagene Riches, CRNA Pre-anesthesia Checklist: Patient identified, Emergency Drugs available, Suction available and Patient being monitored Patient Re-evaluated:Patient Re-evaluated prior to induction Oxygen Delivery Method: Circle System Utilized Preoxygenation: Pre-oxygenation with 100% oxygen Induction Type: IV induction Ventilation: Mask ventilation without difficulty LMA: LMA inserted LMA Size: 4.0 Number of attempts: 1 Airway Equipment and Method: Bite block Placement Confirmation: positive ETCO2 Tube secured with: Tape Dental Injury: Teeth and Oropharynx as per pre-operative assessment

## 2020-04-19 NOTE — Progress Notes (Signed)
Patient ID: Kerry Underwood, male   DOB: 06-28-1951, 69 y.o.   MRN: [id number]   S:    Back from AVF and TDC placement-  Looks pretty good- no c/o's    O:BP 121/63 (BP Location: Right Arm)   Pulse 76   Temp 98 F (36.7 C)   Resp 14   Ht 5\' 5"  (1.651 m)   Wt 81.8 kg   SpO2 98%   BMI 30.01 kg/m   Intake/Output Summary (Last 24 hours) at 04/19/2020 1453 Last data filed at 04/19/2020 1331 Gross per 24 hour  Intake 2372.79 ml  Output 1700 ml  Net 672.79 ml   Intake/Output: I/O last 3 completed shifts: In: 3285.6 [P.O.:820; I.V.:2465.6] Out: 2900 [Urine:2900]  Intake/Output this shift:  Total I/O In: 500 [I.V.:500] Out: -  Weight change:  Gen: NAD CVS: RRR, no rub Resp: cta Abd: +BS, soft, NT/ND Ext: no edema  Recent Labs  Lab 04/15/20 1246 04/16/20 0700 04/17/20 0332 04/18/20 0340 04/19/20 0326  NA 143 142 144 142 142  K 5.7* 4.8 4.3 3.8 3.7  CL 112* 114* 111 108 105  CO2 16* 15* 17* 21* 24  GLUCOSE 103* 90 91 92 87  BUN 86* 83* 81* 80* 78*  CREATININE 10.08* 9.85* 9.00* 9.37* 9.52*  ALBUMIN 3.7 2.9* 2.7* 2.6* 2.5*  CALCIUM 6.4* 5.9* 6.6* 6.4* 6.2*  PHOS  --  8.7* 7.1* 5.4* 4.7*  AST 25 22  --   --   --   ALT 17 15  --   --   --    Liver Function Tests: Recent Labs  Lab 04/15/20 1246 04/15/20 1246 04/16/20 0700 04/16/20 0700 04/17/20 0332 04/18/20 0340 04/19/20 0326  AST 25  --  22  --   --   --   --   ALT 17  --  15  --   --   --   --   ALKPHOS 105  --  84  --   --   --   --   BILITOT 0.5  --  0.3  --   --   --   --   PROT 7.5  --  6.1*  --   --   --   --   ALBUMIN 3.7   < > 2.9*   < > 2.7* 2.6* 2.5*   < > = values in this interval not displayed.   No results for input(s): LIPASE, AMYLASE in the last 168 hours. No results for input(s): AMMONIA in the last 168 hours. CBC: Recent Labs  Lab 04/15/20 1246 04/15/20 1246 04/16/20 0700 04/16/20 1052 04/18/20 0340 04/19/20 0326  WBC 5.7   < > 5.4  --  4.5 4.9  NEUTROABS 2.9  --   --   --   --   --    HGB 7.6*   < > 8.1*  --  7.7* 8.4*  HCT 25.1*   < > 26.0* 26.1* 24.2* 26.6*  MCV 102.9*  --  96.3  --  93.8 94.7  PLT 267   < > 211  --  168 175   < > = values in this interval not displayed.   Cardiac Enzymes: No results for input(s): CKTOTAL, CKMB, CKMBINDEX, TROPONINI in the last 168 hours. CBG: No results for input(s): GLUCAP in the last 168 hours.  Iron Studies:  No results for input(s): IRON, TIBC, TRANSFERRIN, FERRITIN in the last 72 hours. Studies/Results: DG Fluoro Guide CV Line-No Report  Result Date:  04/19/2020 Fluoroscopy was utilized by the requesting physician.  No radiographic interpretation.   Marland Kitchen amLODipine  5 mg Oral Daily  . calcitRIOL  0.5 mcg Oral Daily  . calcium acetate  1,334 mg Oral TID WC  . calcium carbonate  1,000 mg of elemental calcium Oral QHS  . Chlorhexidine Gluconate Cloth  6 each Topical Q0600  . Chlorhexidine Gluconate Cloth  6 each Topical Q0600  . [START ON 04/24/2020] darbepoetin (ARANESP) injection - NON-DIALYSIS  100 mcg Subcutaneous Q Sun-1800  . heparin  5,000 Units Subcutaneous Q8H  . metoprolol tartrate  25 mg Oral BID  . mupirocin ointment  1 application Nasal BID  . tamsulosin  0.4 mg Oral Daily  . umeclidinium bromide  1 puff Inhalation Daily    BMET    Component Value Date/Time   NA 142 04/19/2020 0326   K 3.7 04/19/2020 0326   CL 105 04/19/2020 0326   CO2 24 04/19/2020 0326   GLUCOSE 87 04/19/2020 0326   BUN 78 (H) 04/19/2020 0326   CREATININE 9.52 (H) 04/19/2020 0326   CALCIUM 6.2 (LL) 04/19/2020 0326   GFRNONAA 5 (L) 04/19/2020 0326   GFRAA 6 (L) 04/19/2020 0326   CBC    Component Value Date/Time   WBC 4.9 04/19/2020 0326   RBC 2.81 (L) 04/19/2020 0326   HGB 8.4 (L) 04/19/2020 0326   HCT 26.6 (L) 04/19/2020 0326   HCT 26.1 (L) 04/16/2020 1052   PLT 175 04/19/2020 0326   MCV 94.7 04/19/2020 0326   MCH 29.9 04/19/2020 0326   MCHC 31.6 04/19/2020 0326   RDW 16.1 (H) 04/19/2020 0326   LYMPHSABS 0.8 04/15/2020  1246   MONOABS 0.4 04/15/2020 1246   EOSABS 1.6 (H) 04/15/2020 1246   BASOSABS 0.0 04/15/2020 1246    Assessment/Plan: 1. AKI vs. Progressive CKD stage V-  Feel is more consistent with CKD and actually ESRD.  uremic symptoms.  Will need vascular access placement with AVF, plus TDC   1. We discussed different modalities of RRT including HD, and PD. He does not have anyone at home for HHD. He would be amenable to HD if needed. 2. Consulted [facility] to evaluate for vein mapping and TDC for impending need of dialysis.  S/p AVF and TDC placement, getting  First HD later today.  Will plan for second on Thurs. Have heard from [facility]-  Possibly tts at [facility] 2. Hypocalcemia- Given oral calcium gluconate, calcitriol and started on phoslo   1. Gave dose of IV calcium  2. started calcitriol 0.5 mcg daily.  iPTH 207 3.   Corrected is better and he is not having sxms   3. Anemia of CKD-  iron stores OK, on ESA-  inc dose. PRBC's prn 4. HTN- poorly controlled due to noncompliance with medications. To resume previous outpatient medications, amlodipine and metoprolol. 5. Pre-syncope/dizziness- possibly related to significant anemia.   6. Secondary HPTH- numbers better on phoslo and calcitriol 7. BPH- with nocturia x 6.  restarted flomax. 8. dCHF- appears volume depleted on exam.  9. COPD- stable.  10. Tobacco abuse- ongoing.  Louis Meckel  [employer] [phone]

## 2020-04-19 NOTE — Progress Notes (Signed)
63M RN with nursing student came and picked up patient dentures prior to patient leaving Short Stay.

## 2020-04-19 NOTE — Progress Notes (Signed)
OP HD referral initiated to request treatment at [facility] as it is noted that patient has previously had care with [facility]. Referral will be completed after patient has had his first HD treatment in the hospital at which time HD orders and HepB lab results will be available to submit to [facility] Admissions coordinator.  Navigator will follow up with patient once OP HD seat has been confirmed.  Alphonzo Cruise, [place] Renal Navigator [phone]

## 2020-04-19 NOTE — Transfer of Care (Signed)
Immediate Anesthesia Transfer of Care Note  Patient: Ladarion Munyon  Procedure(s) Performed: Insertion Of Right Internal Jugular Dialysis Catheter (Right ) Basilic Vein Transposition Right Arm (Right )  Patient Location: PACU  Anesthesia Type:General  Level of Consciousness: awake and alert   Airway & Oxygen Therapy: Patient Spontanous Breathing and Patient connected to nasal cannula oxygen  Post-op Assessment: Report given to RN and Post -op Vital signs reviewed and stable  Post vital signs: Reviewed and stable  Last Vitals:  Vitals Value Taken Time  BP    Temp    Pulse 94 04/19/20 1338  Resp 13 04/19/20 1338  SpO2 97 % 04/19/20 1338  Vitals shown include unvalidated device data.  Last Pain:  Vitals:   04/19/20 1200  TempSrc:   PainSc: 0-No pain      Patients Stated Pain Goal: 2 (81/82/99 3716)  Complications: No apparent anesthesia complications

## 2020-04-19 NOTE — Progress Notes (Signed)
HD orders modified to 04/20/2020 per Dr Moshe Cipro. Informed Primary RN , Harley Hallmark at [phone] of above.

## 2020-04-19 NOTE — Progress Notes (Signed)
  Progress Note    04/19/2020 11:49 AM Day of Surgery  Subjective:  No overnight issues  Vitals:   04/19/20 0454 04/19/20 0945  BP: (!) 142/96 (!) 148/85  Pulse: 79 83  Resp: 20 18  Temp: 98.4 F (36.9 C) 98 F (36.7 C)  SpO2: 98% 100%    Physical Exam: aaox3 Non labored respirations Palpable right radial pulse  CBC    Component Value Date/Time   WBC 4.9 04/19/2020 0326   RBC 2.81 (L) 04/19/2020 0326   HGB 8.4 (L) 04/19/2020 0326   HCT 26.6 (L) 04/19/2020 0326   HCT 26.1 (L) 04/16/2020 1052   PLT 175 04/19/2020 0326   MCV 94.7 04/19/2020 0326   MCH 29.9 04/19/2020 0326   MCHC 31.6 04/19/2020 0326   RDW 16.1 (H) 04/19/2020 0326   LYMPHSABS 0.8 04/15/2020 1246   MONOABS 0.4 04/15/2020 1246   EOSABS 1.6 (H) 04/15/2020 1246   BASOSABS 0.0 04/15/2020 1246    BMET    Component Value Date/Time   NA 142 04/19/2020 0326   K 3.7 04/19/2020 0326   CL 105 04/19/2020 0326   CO2 24 04/19/2020 0326   GLUCOSE 87 04/19/2020 0326   BUN 78 (H) 04/19/2020 0326   CREATININE 9.52 (H) 04/19/2020 0326   CALCIUM 6.2 (LL) 04/19/2020 0326   GFRNONAA 5 (L) 04/19/2020 0326   GFRAA 6 (L) 04/19/2020 0326    INR No results found for: INR   Intake/Output Summary (Last 24 hours) at 04/19/2020 1149 Last data filed at 04/19/2020 0700 Gross per 24 hour  Intake 2092.79 ml  Output 2150 ml  Net -57.21 ml     Assessment:  69 y.o. male is in need of permanent hd access  Plan: OR today for tdc and right arm avf vs graft   Brandon C. Donzetta Matters, MD [facility] [place] Office: [phone] Pager: [phone]  04/19/2020 11:49 AM

## 2020-04-19 NOTE — Progress Notes (Signed)
CRITICAL VALUE ALERT  Critical Value: Calcium 6.2  Date & Time Notied:  04/19/2020 @0502   Provider Notified: Sharlet Salina, NP  Orders Received/Actions taken: Calcium gluconate IV

## 2020-04-19 NOTE — Op Note (Signed)
Patient name: Kerry Underwood MRN: [id number] DOB: Jul 24, 1951 Sex: male  04/19/2020 Pre-operative Diagnosis: End-stage renal disease Post-operative diagnosis:  Same Surgeon:  Eda Paschal. Donzetta Matters, MD Assistant: Arlee Muslim, PA Procedure Performed: 1.  Right IJ 19 cm tunneled dialysis catheter placement with ultrasound and fluoroscopic guidance 2.  Right upper extremity for stage basilic vein fistula creation  Indications: 69 year old male now in need of permanent and temporary dialysis access.  He has what appears to be suitable basilic vein on the right we will plan for right upper extremity permanent access and tunneled dialysis catheter placement.  Findings: His right IJ was large and compressible and a 19 cm catheter was placed with both fluoroscopic and ultrasound guidance to the SVC atrial junction.  By ultrasound of his upper extremity his basilic vein was suitable by open exploration this appeared to be 4 mm external diameter was free of disease.  The brachial artery was large measuring approximately 6 mm external diameter was free of disease.  At completion there was a very strong thrill in the fistula and palpable radial artery pulse the wrist.  Both of these were confirmed with Doppler.   Procedure:  The patient was identified in the holding area and taken to the operating where is placed supine operative table.  Initially MAC anesthesia was induced and he was sterilely prepped and draped in the chest in the right upper extremity usual fashion.  Unfortunately he had to be converted to LMA anesthesia and reprepped in his chest area.  A timeout was called.  Ultrasound was used to identify the IJ which was noted to be patent and compressible.  This was cannulated 18-gauge needle and a wire was passed centrally.  A 19 cm catheter was tunneled from counterincision.  The wire tract was serially dilated and the introducer sheath was placed under fluoroscopic guidance and the catheter was placed the  SVC atrial junction also with fluoroscopic guidance.  The catheter was then flushed with heparinized saline affixed to the skin with 3-0 nylon suture and the neck incision was closed with 4-0 Monocryl.  1.6 cc of concentrated heparin was placed in either port.  Dermabond is placed to both sides and a sterile dressing was placed.  Attention was turned to the right upper extremity where we identified a basilic vein below the antecubitum that was actually quite large.  Transverse incision was made.  We dissected out the vein marked for orientation dividing branches between clips and ties.  We dissected through the deep fascia identified the artery placed a vessel loop around this.  The vein was transected distally.  We spatulated flushed heparinized saline and clamped it.  We then clamped the artery distally proximally opened longitudinally and flushed only distally with heparinized saline given the size of the artery.  The vein was then sewn end-to-side with 6-0 Prolene suture.  Prior completion low flushing all direction.  Upon completion we had a very strong thrill in the fistula and there was a palpable radial artery pulse at the wrist.  Both these were confirmed with Doppler.  Satisfied we obtain hemostasis irrigated the wound and closed in layers with Vicryl and Monocryl.  Dermabond is placed at the level of the skin.  He was transferred to the recovery room where chest x-ray was ordered.  He tolerated procedure without immediate complication.  All counts were correct at completion.  EBL: 50 cc    Syncere Eble C. Donzetta Matters, MD [facility] [place] Office: [phone] Pager:  [phone]    

## 2020-04-19 NOTE — Progress Notes (Signed)
Progress Note    Thatcher Doberstein  SKA:[id number] DOB: 02-01-1951  DOA: 04/15/2020 PCP: Patient, No Pcp Per    Brief Narrative:    Medical records reviewed and are as summarized below:  Sanford Lindblad is an 69 y.o. male with a history of chronic kidney disease, COPD,  hypertension.  Patient has not seen a physician over the past year and has not been taking his medications for the past year.  Over the past couple weeks, he has been increasingly tired, fatigued, with shortness of breath with activity and feeling like he is going to pass out.  Today, he did pass out and was brought to the hospital by family member.    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   surgery on 5/11 -dialysis per renal- plan to start after [facility] -IV fluids per renal  Hypertension -Amlodipine and metoprolol  Hyperkalemia -s/p lokelma -monitor closely  Hypocalcemia - IV calcium -Calcitriol was started by nephrology -PTH elevated and vit D low  Anemia of chronic kidney disease -patient was Symptomatic - s/p 2 units -daily labs  BPH -Flomax  COPD -Compensated -continue to monitor  Itching  - atarax or sarna   Family Communication/Anticipated D/C date and plan/Code Status   DVT prophylaxis: Heparin Code Status: DNR Disposition Plan: Status is: Inpatient  Remains inpatient appropriate because:Ongoing diagnostic testing needed not appropriate for outpatient work up   Dispo: The patient is from: Home              Anticipated d/c is  to: Home              Anticipated d/c date is: undetermined due to need for procedures etc              Patient currently is not medically stable to d/c.  Patient  need dialysis and access placed   Medical Consultants:    Renal  Vascular surgery     Subjective:   No overnight events  Objective:    Vitals:   04/18/20 0855 04/18/20 2059 04/19/20 0454 04/19/20 0454  BP: (!) 142/78 140/72 (!) 142/96 (!) 142/96  Pulse: 80 80 82 79  Resp:  18 20 20   Temp: 98 F (36.7 C) 99 F (37.2 C) 98.4 F (36.9 C) 98.4 F (36.9 C)  TempSrc: Oral Oral Oral Oral  SpO2: 98% 98% 99% 98%  Weight:    81.8 kg  Height:        Intake/Output Summary (Last 24 hours) at 04/19/2020 0939 Last data filed at 04/19/2020 0700 Gross per 24 hour  Intake 2332.79 ml  Output 2350 ml  Net -17.21 ml   Filed Weights   04/15/20 1830 04/16/20 2226 04/19/20 0454  Weight: 77.3 kg 79.2 kg 81.8 kg    Exam:  General: Appearance:    Mildly obese male in no acute distress     Lungs:     Clear to auscultation bilaterally, respirations unlabored  Heart:    Normal heart rate. Normal rhythm. No murmurs, rubs, or gallops.   MS:  All extremities are intact.   Neurologic:   Awake, alert, oriented x 3. No apparent focal neurological           defect.      Data Reviewed:   I have personally reviewed following labs and imaging studies:  Labs: Labs show the following:   Basic Metabolic Panel: Recent Labs  Lab 04/15/20 1246 04/15/20 1246 04/16/20 0700 04/16/20 0700 04/17/20 0332 04/17/20 0332 04/18/20 0340 04/19/20 0326  NA 143  --  142  --  144  --  142 142  K 5.7*   < > 4.8   < > 4.3   < > 3.8 3.7  CL 112*  --  114*  --  111  --  108 105  CO2 16*  --  15*  --  17*  --  21* 24  GLUCOSE 103*  --  90  --  91  --  92 87  BUN 86*  --  83*  --  81*  --  80* 78*  CREATININE 10.08*  --  9.85*  --  9.00*  --  9.37* 9.52*  CALCIUM 6.4*  --  5.9*  --  6.6*  --  6.4* 6.2*  PHOS  --   --  8.7*  --  7.1*  --   5.4* 4.7*   < > = values in this interval not displayed.   GFR Estimated Creatinine Clearance: 7.1 mL/min (A) (by C-G formula based on SCr of 9.52 mg/dL (H)). Liver Function Tests: Recent Labs  Lab 04/15/20 1246 04/16/20 0700 04/17/20 0332 04/18/20 0340 04/19/20 0326  AST 25 22  --   --   --   ALT 17 15  --   --   --   ALKPHOS 105 84  --   --   --   BILITOT 0.5 0.3  --   --   --   PROT 7.5 6.1*  --   --   --   ALBUMIN 3.7 2.9* 2.7* 2.6* 2.5*   No results for input(s): LIPASE, AMYLASE in the last 168 hours. No results for input(s): AMMONIA in the last 168 hours. Coagulation profile No results for input(s): INR, PROTIME in the last 168 hours.  CBC: Recent Labs  Lab 04/15/20 1246 04/16/20 0700 04/16/20 1052 04/18/20 0340 04/19/20 0326  WBC 5.7 5.4  --  4.5 4.9  NEUTROABS 2.9  --   --   --   --   HGB 7.6* 8.1*  --  7.7* 8.4*  HCT 25.1* 26.0* 26.1* 24.2* 26.6*  MCV 102.9* 96.3  --  93.8 94.7  PLT 267 211  --  168 175   Cardiac Enzymes: No results for input(s): CKTOTAL, CKMB, CKMBINDEX, TROPONINI in the last 168 hours. BNP (last 3 results) No results for input(s): PROBNP in the last 8760 hours. CBG: No results for input(s): GLUCAP in the last 168 hours. D-Dimer: No results for input(s): DDIMER in the last 72 hours. Hgb A1c: No results for input(s): HGBA1C in the last 72 hours. Lipid Profile: No results for input(s): CHOL, HDL, LDLCALC, TRIG, CHOLHDL, LDLDIRECT in the last 72 hours. Thyroid function studies: No results for input(s): TSH, T4TOTAL, T3FREE, THYROIDAB in the last 72 hours.  Invalid input(s): FREET3 Anemia work up: No results for input(s): VITAMINB12, FOLATE, FERRITIN, TIBC, IRON, RETICCTPCT in the last 72 hours. Sepsis Labs: Recent Labs  Lab 04/15/20 1246 04/16/20 0700 04/18/20 0340 04/19/20 0326  WBC 5.7 5.4 4.5 4.9  Microbiology Recent Results (from the past 240 hour(s))  Respiratory Panel by RT PCR (Flu A&B, Covid) - Nasopharyngeal  Swab     Status: None   Collection Time: 04/15/20  2:54 PM   Specimen: Nasopharyngeal Swab  Result Value Ref Range Status   SARS Coronavirus 2 by RT PCR NEGATIVE NEGATIVE Final    Comment: (NOTE) SARS-CoV-2 target nucleic acids are NOT DETECTED. The SARS-CoV-2 RNA is generally detectable in upper respiratoy specimens during the acute phase of infection. The lowest concentration of SARS-CoV-2 viral copies this assay can detect is 131 copies/mL. A negative result does not preclude SARS-Cov-2 infection and should not be used as the sole basis for treatment or other patient management decisions. A negative result may occur with  improper specimen collection/handling, submission of specimen other than nasopharyngeal swab, presence of viral mutation(s) within the areas targeted by this assay, and inadequate number of viral copies (<131 copies/mL). A negative result must be combined with clinical observations, patient history, and epidemiological information. The expected result is Negative. Fact Sheet for Patients:  PinkCheek.be Fact Sheet for Healthcare Providers:  GravelBags.it This test is not yet ap proved or cleared by the [place] FDA and  has been authorized for detection and/or diagnosis of SARS-CoV-2 by FDA under an Emergency Use Authorization (EUA). This EUA will remain  in effect (meaning this test can be used) for the duration of the COVID-19 declaration under Section 564(b)(1) of the Act, 21 U.S.C. section 360bbb-3(b)(1), unless the authorization is terminated or revoked sooner.    Influenza A by PCR NEGATIVE NEGATIVE Final   Influenza B by PCR NEGATIVE NEGATIVE Final    Comment: (NOTE) The Xpert Xpress SARS-CoV-2/FLU/RSV assay is intended as an aid in  the diagnosis of influenza from Nasopharyngeal swab specimens and  should not be used as a sole basis for treatment. Nasal washings and  aspirates are  unacceptable for Xpert Xpress SARS-CoV-2/FLU/RSV  testing. Fact Sheet for Patients: PinkCheek.be Fact Sheet for Healthcare Providers: GravelBags.it This test is not yet approved or cleared by the [place] FDA and  has been authorized for detection and/or diagnosis of SARS-CoV-2 by  FDA under an Emergency Use Authorization (EUA). This EUA will remain  in effect (meaning this test can be used) for the duration of the  Covid-19 declaration under Section 564(b)(1) of the Act, 21  U.S.C. section 360bbb-3(b)(1), unless the authorization is  terminated or revoked. Performed at [facility], [address], [place], [place] [address]   Surgical pcr screen     Status: Abnormal   Collection Time: 04/18/20  9:19 PM   Specimen: Nasal Mucosa; Nasal Swab  Result Value Ref Range Status   MRSA, PCR POSITIVE (A) NEGATIVE Final    Comment: RESULT CALLED TO, READ BACK BY AND VERIFIED WITH: Lendell Caprice RN 04/19/20 0048 JDW    Staphylococcus aureus POSITIVE (A) NEGATIVE Final    Comment: (NOTE) The Xpert SA Assay (FDA approved for NASAL specimens in patients 45 years of age and older), is one component of a comprehensive surveillance program. It is not intended to diagnose infection nor to guide or monitor treatment. Performed at [facility], [place] [address], [place], [place] [address]     Procedures and diagnostic studies:  No results found.  Medications:   . amLODipine  5 mg Oral Daily  . calcitRIOL  0.5 mcg Oral Daily  . calcium acetate  1,334 mg Oral TID WC  . calcium carbonate  1,000 mg of  elemental calcium Oral QHS  . Chlorhexidine Gluconate Cloth  6 each Topical Q0600  . Chlorhexidine Gluconate Cloth  6 each Topical Q0600  . [START ON 04/24/2020] darbepoetin (ARANESP) injection - NON-DIALYSIS  100 mcg Subcutaneous Q Sun-1800  . heparin  5,000 Units Subcutaneous Q8H  . metoprolol tartrate  25 mg Oral BID  . mupirocin  ointment  1 application Nasal BID  . tamsulosin  0.4 mg Oral Daily  . umeclidinium bromide  1 puff Inhalation Daily   Continuous Infusions: .  ceFAZolin (ANCEF) IV    .  sodium bicarbonate (isotonic) infusion in sterile water 75 mL/hr at 04/18/20 0729     LOS: 4 days   Geradine Girt  [facility]   How to contact the [facility] Attending or Consulting provider [place] or covering provider during after hours [place], for this patient?  1. Check the care team in [facility] and look for a) attending/consulting [facility] provider listed and b) the [facility] team listed 2. Log into [email or web address] and use [place]'s universal password to access. If you do not have the password, please contact the hospital operator. 3. Locate the [facility] provider you are looking for under [facility] and page to a number that you can be directly reached. 4. If you still have difficulty reaching the provider, please page the [facility] (Director on Call) for the Hospitalists listed on amion for assistance.  04/19/2020, 9:39 AM

## 2020-04-19 NOTE — Plan of Care (Signed)
  Problem: Education: Goal: Knowledge of General Education information will improve Description Including pain rating scale, medication(s)/side effects and non-pharmacologic comfort measures Outcome: Progressing   

## 2020-04-19 NOTE — Plan of Care (Signed)
  Problem: Education: Goal: Knowledge of General Education information will improve Description: Including pain rating scale, medication(s)/side effects and non-pharmacologic comfort measures Outcome: Progressing   Problem: Activity: Goal: Risk for activity intolerance will decrease Outcome: Progressing   

## 2020-04-20 DIAGNOSIS — R52 Pain, unspecified: Secondary | ICD-10-CM | POA: Insufficient documentation

## 2020-04-20 DIAGNOSIS — D689 Coagulation defect, unspecified: Secondary | ICD-10-CM | POA: Insufficient documentation

## 2020-04-20 DIAGNOSIS — D509 Iron deficiency anemia, unspecified: Secondary | ICD-10-CM | POA: Insufficient documentation

## 2020-04-20 DIAGNOSIS — Z23 Encounter for immunization: Secondary | ICD-10-CM | POA: Insufficient documentation

## 2020-04-20 DIAGNOSIS — L299 Pruritus, unspecified: Secondary | ICD-10-CM | POA: Insufficient documentation

## 2020-04-20 DIAGNOSIS — N2581 Secondary hyperparathyroidism of renal origin: Secondary | ICD-10-CM | POA: Insufficient documentation

## 2020-04-20 DIAGNOSIS — I129 Hypertensive chronic kidney disease with stage 1 through stage 4 chronic kidney disease, or unspecified chronic kidney disease: Secondary | ICD-10-CM | POA: Insufficient documentation

## 2020-04-20 DIAGNOSIS — I5032 Chronic diastolic (congestive) heart failure: Secondary | ICD-10-CM | POA: Insufficient documentation

## 2020-04-20 DIAGNOSIS — I5033 Acute on chronic diastolic (congestive) heart failure: Secondary | ICD-10-CM | POA: Insufficient documentation

## 2020-04-20 DIAGNOSIS — E039 Hypothyroidism, unspecified: Secondary | ICD-10-CM | POA: Insufficient documentation

## 2020-04-20 DIAGNOSIS — R197 Diarrhea, unspecified: Secondary | ICD-10-CM | POA: Insufficient documentation

## 2020-04-20 LAB — RENAL FUNCTION PANEL
Albumin: 2.6 g/dL — ABNORMAL LOW (ref 3.5–5.0)
Anion gap: 17 — ABNORMAL HIGH (ref 5–15)
BUN: 80 mg/dL — ABNORMAL HIGH (ref 8–23)
CO2: 25 mmol/L (ref 22–32)
Calcium: 6.7 mg/dL — ABNORMAL LOW (ref 8.9–10.3)
Chloride: 101 mmol/L (ref 98–111)
Creatinine, Ser: 8.81 mg/dL — ABNORMAL HIGH (ref 0.61–1.24)
GFR calc Af Amer: 6 mL/min — ABNORMAL LOW (ref >60)
GFR calc non Af Amer: 5 mL/min — ABNORMAL LOW (ref >60)
Glucose, Bld: 108 mg/dL — ABNORMAL HIGH (ref 70–99)
Phosphorus: 5 mg/dL — ABNORMAL HIGH (ref 2.5–4.6)
Potassium: 4.5 mmol/L (ref 3.5–5.1)
Sodium: 143 mmol/L (ref 135–145)

## 2020-04-20 LAB — IMMUNOFIXATION, URINE

## 2020-04-20 LAB — HEPATITIS B SURFACE ANTIGEN: Hepatitis B Surface Ag: NONREACTIVE

## 2020-04-20 LAB — HEPATITIS B SURFACE ANTIBODY,QUALITATIVE: Hep B S Ab: NONREACTIVE

## 2020-04-20 LAB — HEPATITIS B CORE ANTIBODY, TOTAL: Hep B Core Total Ab: NONREACTIVE

## 2020-04-20 MED ORDER — HEPARIN SODIUM (PORCINE) 1000 UNIT/ML DIALYSIS: 1000.0000 [IU] | INTRAMUSCULAR | Status: DC | PRN

## 2020-04-20 MED ORDER — LIDOCAINE-PRILOCAINE 2.5-2.5 % EX CREA: 1.0000 "application " | TOPICAL_CREAM | CUTANEOUS | Status: DC | PRN

## 2020-04-20 MED ORDER — ALBUTEROL SULFATE HFA 108 (90 BASE) MCG/ACT IN AERS
2.0000 | INHALATION_SPRAY | Freq: Four times a day (QID) | RESPIRATORY_TRACT | 0 refills | Status: DC | PRN
Start: 2020-04-20 — End: 2021-07-02

## 2020-04-20 MED ORDER — PENTAFLUOROPROP-TETRAFLUOROETH EX AERO: 1.0000 "application " | INHALATION_SPRAY | CUTANEOUS | Status: DC | PRN

## 2020-04-20 MED ORDER — SPIRIVA HANDIHALER 18 MCG IN CAPS: 18.0000 ug | ORAL_CAPSULE | Freq: Every day | RESPIRATORY_TRACT | 0 refills | Status: DC

## 2020-04-20 MED ORDER — CALCITRIOL 0.5 MCG PO CAPS: 0.5000 ug | ORAL_CAPSULE | Freq: Every day | ORAL | 0 refills | Status: AC

## 2020-04-20 MED ORDER — TAMSULOSIN HCL 0.4 MG PO CAPS: 0.4000 mg | ORAL_CAPSULE | Freq: Every day | ORAL | 0 refills | Status: AC

## 2020-04-20 MED ORDER — ALTEPLASE 2 MG IJ SOLR: 2.0000 mg | Freq: Once | INTRAMUSCULAR | Status: DC | PRN

## 2020-04-20 MED ORDER — CALCIUM ACETATE (PHOS BINDER) 667 MG PO CAPS: 1334.0000 mg | ORAL_CAPSULE | Freq: Three times a day (TID) | ORAL | 0 refills | Status: AC

## 2020-04-20 MED ORDER — METOPROLOL TARTRATE 25 MG PO TABS: 25.0000 mg | ORAL_TABLET | Freq: Two times a day (BID) | ORAL | 0 refills | Status: DC

## 2020-04-20 MED ORDER — AMLODIPINE BESYLATE 5 MG PO TABS: 5.0000 mg | ORAL_TABLET | Freq: Every day | ORAL | 0 refills | Status: DC

## 2020-04-20 MED ORDER — SODIUM CHLORIDE 0.9 % IV SOLN: 100.0000 mL | INTRAVENOUS | Status: DC | PRN

## 2020-04-20 MED ORDER — LIDOCAINE HCL (PF) 1 % IJ SOLN: 5.0000 mL | INTRAMUSCULAR | Status: DC | PRN

## 2020-04-20 MED ORDER — KIDNEY FAILURE BOOK
Freq: Once | Status: AC
  Filled 2020-04-20: qty 1

## 2020-04-20 MED ORDER — CALCIUM CARBONATE 1250 (500 CA) MG PO TABS: 1.0000 | ORAL_TABLET | Freq: Every day | ORAL | 0 refills | Status: DC

## 2020-04-20 NOTE — Progress Notes (Signed)
DISCHARGE NOTE HOME Kerry Underwood to be discharged Home per MD order. Discussed prescriptions and follow up appointments with the patient. Prescriptions given to patient; medication list explained in detail. Patient verbalized understanding.  Skin clean, dry and intact without evidence of skin break down, no evidence of skin tears noted. IV catheter discontinued intact. Site without signs and symptoms of complications. Dressing and pressure applied. Pt denies pain at the site currently. No complaints noted.  Patient free of lines, drains, and wounds.   An After Visit Summary (AVS) was printed and given to the patient. Patient escorted via wheelchair, and discharged home via private auto.  Arlyss Repress, RN

## 2020-04-20 NOTE — Progress Notes (Signed)
Patient has been accepted at [facility] on a TTS schedule with a seat time of 6:45am. He needs to arrive at his appointments at 6:30am.  He will need to sign consent paperwork the day prior to his first treatment.  Navigator met with patient to discuss. He is satisfied with this arrangement and states he has applied for [employer] transportation if needed. He also reports that his niece lives nearby and that she can assist with transportation as needed.  Alphonzo Cruise, [place] Renal Navigator [phone]

## 2020-04-20 NOTE — Progress Notes (Signed)
Patient walked about 150 feet. He did great.  He states that he doesn't want Physical Therapy he is fine.

## 2020-04-20 NOTE — Anesthesia Postprocedure Evaluation (Signed)
Anesthesia Post Note  Patient: Kerry Underwood  Procedure(s) Performed: Insertion Of Right Internal Jugular Dialysis Catheter (Right ) Basilic Vein Transposition Right Arm (Right )     Patient location during evaluation: PACU Anesthesia Type: MAC Level of consciousness: awake and alert Pain management: pain level controlled Vital Signs Assessment: post-procedure vital signs reviewed and stable Respiratory status: spontaneous breathing, nonlabored ventilation, respiratory function stable and patient connected to nasal cannula oxygen Cardiovascular status: stable and blood pressure returned to baseline Postop Assessment: no apparent nausea or vomiting Anesthetic complications: no    Last Vitals:  Vitals:   04/19/20 2104 04/20/20 0444  BP: 117/66 127/66  Pulse: 75 72  Resp: 16 16  Temp: 37.1 C 36.8 C  SpO2:  100%    Last Pain:  Vitals:   04/20/20 0444  TempSrc: Oral  PainSc:                  Seldon Barrell COKER

## 2020-04-20 NOTE — Discharge Summary (Signed)
Discharge Summary  Kerry Underwood EQA:[id number] DOB: 28-Mar-1951  PCP: Patient, No Pcp Per  Admit date: 04/15/2020 Discharge date: 04/20/2020  Time spent: 40 mins  Recommendations for Outpatient Follow-up:  1. Patient to follow-up with PCP, to reestablish care 2. Patient to follow-up with nephrology, dialysis sessions 3. Patient to follow-up with vascular surgery  Discharge Diagnoses:  Active Hospital Problems   Diagnosis Date Noted  . Acute on chronic renal failure ([place]) 04/15/2020  . Anemia due to chronic kidney disease 04/15/2020  . Hyperkalemia 04/15/2020  . Hypocalcemia 04/15/2020  . Benign prostatic hyperplasia with urinary retention   . Chronic obstructive pulmonary disease ([place])   . Hypertension secondary to other renal disorders     Resolved Hospital Problems  No resolved problems to display.    Discharge Condition: Stable  Diet recommendation: Renal diet  Vitals:   04/20/20 0900 04/20/20 0911  BP: 133/74 135/69  Pulse: 74 79  Resp:  16  Temp:  98.2 F (36.8 C)  SpO2:  100%    History of present illness:  Kerry Underwood is a 69 y.o. male with a history of chronic kidney disease, COPD, hypertension. Patient has not seen a physician over the past year and has not been taking his medications for the past year. Over the past couple weeks, he has been increasingly tired, fatigued, with shortness of breath with activity and an episode of passing out, was brought to the hospital by family member.     Today, patient denies any new complaints, denies any chest pain, abdominal pain, nausea/vomiting, fever/chills, shortness of breath.  Patient was able to ambulate 150 feet without any issues.  Refused physical therapy consult.    Hospital Course:  Principal Problem:   Acute on chronic renal failure (HCC) Active Problems:   Chronic obstructive pulmonary disease (HCC)   Hypertension secondary to other renal disorders   Benign prostatic hyperplasia with urinary  retention   Anemia due to chronic kidney disease   Hyperkalemia   Hypocalcemia  Acute vs progressive chronic renal failure stage  Nephrology consult appreciated, outpatient dialysis set up for T/TH/S schedule, plan to continue outpatient dialysis on 04/21/2020 Vascular consult appreciated, status post [facility] and right upper extremity fistula creation on 04/19/2020.  Follow-up with vascular as an outpatient as scheduled  Hypertension Continue amlodipine and metoprolol  Hypocalcemia Continue calcitriol, supplemental calcium PTH elevated and vit D low  Anemia of chronic kidney disease s/p 2 units Follow-up with PCP  BPH Continue Flomax  COPD Stable, continue home inhalers        Malnutrition Type:      Malnutrition Characteristics:      Nutrition Interventions:      Estimated body mass index is 29.61 kg/m as calculated from the following:   Height as of this encounter: 5\' 5"  (1.651 m).   Weight as of this encounter: 80.7 kg.    Procedures: TDC and right upper extremity fistula creation on 04/19/2020  Consultations:  Nephrology  Vascular surgery  Discharge Exam: BP 135/69 (BP Location: Left Arm)   Pulse 79   Temp 98.2 F (36.8 C) (Oral)   Resp 16   Ht 5\' 5"  (1.651 m)   Wt 80.7 kg   SpO2 100%   BMI 29.61 kg/m   General: NAD Cardiovascular: S1, S2 present Respiratory: CTA B    Discharge Instructions You were cared for by a hospitalist during your hospital stay. If you have any questions about your discharge medications or the care you received while  you were in the hospital after you are discharged, you can call the unit and asked to speak with the hospitalist on call if the hospitalist that took care of you is not available. Once you are discharged, your primary care physician will handle any further medical issues. Please note that NO REFILLS for any discharge medications will be authorized once you are discharged, as it is imperative that you  return to your primary care physician (or establish a relationship with a primary care physician if you do not have one) for your aftercare needs so that they can reassess your need for medications and monitor your lab values.  Discharge Instructions    Diet - low sodium heart healthy   Complete by: As directed    Increase activity slowly   Complete by: As directed      Allergies as of 04/20/2020      Reactions   Strawberry (diagnostic)       Medication List    STOP taking these medications   ciprofloxacin 500 MG tablet Commonly known as: CIPRO   hydrochlorothiazide 25 MG tablet Commonly known as: HYDRODIURIL   multivitamin with minerals Tabs tablet   ondansetron 4 MG tablet Commonly known as: Zofran     TAKE these medications   albuterol 108 (90 Base) MCG/ACT inhaler Commonly known as: VENTOLIN HFA Inhale 2 puffs into the lungs every 6 (six) hours as needed for wheezing or shortness of breath.   amLODipine 5 MG tablet Commonly known as: NORVASC Take 1 tablet (5 mg total) by mouth daily. Start taking on: Apr 21, 2020 What changed:   medication strength  how much to take   aspirin EC 325 MG tablet Take 325 mg by mouth daily.   calcitRIOL 0.5 MCG capsule Commonly known as: ROCALTROL Take 1 capsule (0.5 mcg total) by mouth daily. Start taking on: Apr 21, 2020   calcium acetate 667 MG capsule Commonly known as: PHOSLO Take 2 capsules (1,334 mg total) by mouth 3 (three) times daily with meals.   calcium carbonate 1250 (500 Ca) MG tablet Commonly known as: OS-CAL - dosed in mg of elemental calcium Take 1 tablet (500 mg of elemental calcium total) by mouth at bedtime.   metoprolol tartrate 25 MG tablet Commonly known as: LOPRESSOR Take 1 tablet (25 mg total) by mouth 2 (two) times daily.   Spiriva HandiHaler 18 MCG inhalation capsule Generic drug: tiotropium Place 1 capsule (18 mcg total) into inhaler and inhale daily.   tamsulosin 0.4 MG Caps  capsule Commonly known as: FLOMAX Take 1 capsule (0.4 mg total) by mouth daily.      Allergies  Allergen Reactions  . Strawberry (Diagnostic)    Follow-up Information    Waynetta Sandy, MD Follow up in 5 week(s).   Specialties: Vascular Surgery, Cardiology Contact information: [address] [place] [address] [phone]            The results of significant diagnostics from this hospitalization (including imaging, microbiology, ancillary and laboratory) are listed below for reference.    Significant Diagnostic Studies: CT Head Wo Contrast  Result Date: 04/15/2020 CLINICAL DATA:  Ataxia with near syncopal episodes EXAM: CT HEAD WITHOUT CONTRAST TECHNIQUE: Contiguous axial images were obtained from the base of the skull through the vertex without intravenous contrast. COMPARISON:  None. FINDINGS: Brain: There is mild diffuse atrophy. There is no intracranial mass, hemorrhage, extra-axial fluid collection, or midline shift. There is patchy small vessel disease in the centra semiovale bilaterally.  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 No acute infarct is demonstrable on this study. Vascular: There is no hyperdense vessel. There is calcification in each distal vertebral artery and carotid siphon region. Skull: The bony calvarium appears intact. Sinuses/Orbits: There is mucosal thickening in several ethmoid air cells. Other visualized paranasal sinuses are clear. Orbits appear symmetric bilaterally. Other: Mastoid air cells are clear. IMPRESSION: Mild atrophy with periventricular small vessel disease. 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 No acute infarct evident. No mass or hemorrhage. There are foci of arterial vascular calcification. There is mucosal thickening in several ethmoid air cells. Electronically Signed   By: Lowella Grip III M.D.   On: 04/15/2020 12:23   US RENAL  Result Date: 04/15/2020 CLINICAL DATA:  Acute  renal failure EXAM: RENAL / URINARY TRACT ULTRASOUND COMPLETE COMPARISON:  CT 09/11/2017 FINDINGS: Right Kidney: Renal measurements: 7.6 x 2.8 x 4.4 cm = volume: 47.9 mL. Increased cortical echogenicity. Atrophic with diffuse cortical thinning. No hydronephrosis or mass Left Kidney: Renal measurements: 8.7 x 5 x 4.8 cm = volume: 109.4 mL. Echogenic cortex. No hydronephrosis. Cyst at the midpole measuring 3 cm. Cortical thinning is present Bladder: Appears normal for degree of bladder distention. Other: None. IMPRESSION: 1. Echogenic kidneys bilaterally consistent with medical renal disease. Marked atrophy of the right kidney with mild atrophy of left kidney. No hydronephrosis 2. Cyst in the left kidney Electronically Signed   By: Donavan Foil M.D.   On: 04/15/2020 22:24   DG CHEST PORT 1 VIEW  Result Date: 04/19/2020 CLINICAL DATA:  Dialysis catheter placement EXAM: PORTABLE CHEST 1 VIEW COMPARISON:  None. FINDINGS: Interval placement of right side dialysis catheter with the tip at the cavoatrial junction. No pneumothorax. Heart is normal size. Lungs are clear. No effusions or acute bony abnormality. IMPRESSION: Right dialysis catheter tip at the cavoatrial junction. No pneumothorax. No active cardiopulmonary disease. Electronically Signed   By: Rolm Baptise M.D.   On: 04/19/2020 15:50   DG Fluoro Guide CV Line-No Report  Result Date: 04/19/2020 Fluoroscopy was utilized by the requesting physician.  No radiographic interpretation.   VAS [place] UPPER EXTREMITY ARTERIAL DUPLEX  Result Date: 04/17/2020 UPPER EXTREMITY DUPLEX STUDY Indications: Patient complains of for avf/avg Tuesday next week.  Risk Factors: Hypertension. Comparison Study: no prior Performing Technologist: Abram Sander RVS  Examination Guidelines: A complete evaluation includes B-mode imaging, spectral Doppler, color Doppler, and power Doppler as needed of all accessible portions of each vessel. Bilateral testing is considered an integral part  of a complete examination. Limited examinations for reoccurring indications may be performed as noted.  Right Doppler Findings: +---------------+----------+---------+--------+--------+ Site           PSV (cm/s)Waveform StenosisComments +---------------+----------+---------+--------+--------+ Subclavian Prox81        triphasic                 +---------------+----------+---------+--------+--------+                                                    +---------------+----------+---------+--------+--------+ Axillary       154.00    triphasic                 +---------------+----------+---------+--------+--------+ Brachial Prox  115       triphasic                 +---------------+----------+---------+--------+--------+  Brachial Mid   123       triphasic                 +---------------+----------+---------+--------+--------+ Brachial Dist  97        triphasic                 +---------------+----------+---------+--------+--------+ Radial Dist    165       triphasic                 +---------------+----------+---------+--------+--------+ Ulnar Dist     111       triphasic                 +---------------+----------+---------+--------+--------+  Left Doppler Findings: +---------------+----------+---------+--------+--------+ Site           PSV (cm/s)Waveform StenosisComments +---------------+----------+---------+--------+--------+ Subclavian Prox114       triphasic                 +---------------+----------+---------+--------+--------+ Axillary       82.00     triphasic                 +---------------+----------+---------+--------+--------+ Brachial Prox  103       triphasic                 +---------------+----------+---------+--------+--------+ Brachial Mid   73        triphasic                 +---------------+----------+---------+--------+--------+ Brachial Dist  86        triphasic                  +---------------+----------+---------+--------+--------+ Radial Dist    62        triphasic                 +---------------+----------+---------+--------+--------+ Ulnar Dist     166       triphasic                 +---------------+----------+---------+--------+--------+   Summary:  Right: No obstruction visualized in the right upper extremity. Left: No obstruction visualized in the left upper extremity. *See table(s) above for measurements and observations. Electronically signed by Deitra Mayo MD on 04/17/2020 at 7:21:17 AM.    Final    VAS [place] UPPER EXT VEIN MAPPING (PRE-OP AVF)  Result Date: 04/17/2020 UPPER EXTREMITY VEIN MAPPING  Indications: History of PAD; patient is pre-operative for bypass. Comparison Study: no prior Performing Technologist: Abram Sander RVS  Examination Guidelines: A complete evaluation includes B-mode imaging, spectral Doppler, color Doppler, and power Doppler as needed of all accessible portions of each vessel. Bilateral testing is considered an integral part of a complete examination. Limited examinations for reoccurring indications may be performed as noted. +-----------------+-------------+----------+--------------+ Right Cephalic   Diameter (cm)Depth (cm)   Findings    +-----------------+-------------+----------+--------------+ Shoulder             0.24        1.07                  +-----------------+-------------+----------+--------------+ Prox upper arm       0.20        0.66                  +-----------------+-------------+----------+--------------+ Mid upper arm        0.22        0.57                  +-----------------+-------------+----------+--------------+  Dist upper arm       0.23        0.71     branching    +-----------------+-------------+----------+--------------+ Antecubital fossa    0.32        0.58                  +-----------------+-------------+----------+--------------+ Prox forearm         0.32        0.62                   +-----------------+-------------+----------+--------------+ Mid forearm                               branching    +-----------------+-------------+----------+--------------+ Dist forearm                             iv bandages   +-----------------+-------------+----------+--------------+ Wrist                                   not visualized +-----------------+-------------+----------+--------------+ +-----------------+-------------+----------+------------------------------+ Right Basilic    Diameter (cm)Depth (cm)           Findings            +-----------------+-------------+----------+------------------------------+ Prox upper arm       0.36        1.24                                  +-----------------+-------------+----------+------------------------------+ Mid upper arm        0.42        0.84                                  +-----------------+-------------+----------+------------------------------+ Dist upper arm       0.48        0.78                                  +-----------------+-------------+----------+------------------------------+ Antecubital fossa    0.30        0.40                                  +-----------------+-------------+----------+------------------------------+ Prox forearm         0.46        0.50             branching            +-----------------+-------------+----------+------------------------------+ Mid forearm                             not visualized and iv bandages +-----------------+-------------+----------+------------------------------+ Distal forearm                          not visualized and iv bandages +-----------------+-------------+----------+------------------------------+ Elbow  not visualized         +-----------------+-------------+----------+------------------------------+ Wrist                                           not visualized          +-----------------+-------------+----------+------------------------------+ +-----------------+-------------+----------+-----------+ Left Cephalic    Diameter (cm)Depth (cm) Findings   +-----------------+-------------+----------+-----------+ Shoulder             0.26        0.62               +-----------------+-------------+----------+-----------+ Prox upper arm       0.18        0.72               +-----------------+-------------+----------+-----------+ Mid upper arm        0.18        0.73               +-----------------+-------------+----------+-----------+ Dist upper arm       0.24        0.69               +-----------------+-------------+----------+-----------+ Antecubital fossa    0.53        0.39               +-----------------+-------------+----------+-----------+ Prox forearm         0.27        0.40               +-----------------+-------------+----------+-----------+ Mid forearm          0.37        0.38               +-----------------+-------------+----------+-----------+ Dist forearm                            iv bandages +-----------------+-------------+----------+-----------+ +-----------------+-------------+----------+--------------+ Left Basilic     Diameter (cm)Depth (cm)   Findings    +-----------------+-------------+----------+--------------+ Prox upper arm       0.43        1.12                  +-----------------+-------------+----------+--------------+ Mid upper arm        0.43        1.39                  +-----------------+-------------+----------+--------------+ Dist upper arm       0.22        0.99                  +-----------------+-------------+----------+--------------+ Antecubital fossa    0.24        0.63                  +-----------------+-------------+----------+--------------+ Prox forearm                            not visualized +-----------------+-------------+----------+--------------+  Mid forearm                              iv bandages   +-----------------+-------------+----------+--------------+ Distal forearm  iv bandages   +-----------------+-------------+----------+--------------+ *See table(s) above for measurements and observations.  Diagnosing physician: Deitra Mayo MD Electronically signed by Deitra Mayo MD on 04/17/2020 at 7:21:26 AM.    Final     Microbiology: Recent Results (from the past 240 hour(s))  Respiratory Panel by RT PCR (Flu A&B, Covid) - Nasopharyngeal Swab     Status: None   Collection Time: 04/15/20  2:54 PM   Specimen: Nasopharyngeal Swab  Result Value Ref Range Status   SARS Coronavirus 2 by RT PCR NEGATIVE NEGATIVE Final    Comment: (NOTE) SARS-CoV-2 target nucleic acids are NOT DETECTED. The SARS-CoV-2 RNA is generally detectable in upper respiratoy specimens during the acute phase of infection. The lowest concentration of SARS-CoV-2 viral copies this assay can detect is 131 copies/mL. A negative result does not preclude SARS-Cov-2 infection and should not be used as the sole basis for treatment or other patient management decisions. A negative result may occur with  improper specimen collection/handling, submission of specimen other than nasopharyngeal swab, presence of viral mutation(s) within the areas targeted by this assay, and inadequate number of viral copies (<131 copies/mL). A negative result must be combined with clinical observations, patient history, and epidemiological information. The expected result is Negative. Fact Sheet for Patients:  PinkCheek.be Fact Sheet for Healthcare Providers:  GravelBags.it This test is not yet ap proved or cleared by the [place] FDA and  has been authorized for detection and/or diagnosis of SARS-CoV-2 by FDA under an Emergency Use Authorization (EUA). This EUA will remain  in effect  (meaning this test can be used) for the duration of the COVID-19 declaration under Section 564(b)(1) of the Act, 21 U.S.C. section 360bbb-3(b)(1), unless the authorization is terminated or revoked sooner.    Influenza A by PCR NEGATIVE NEGATIVE Final   Influenza B by PCR NEGATIVE NEGATIVE Final    Comment: (NOTE) The Xpert Xpress SARS-CoV-2/FLU/RSV assay is intended as an aid in  the diagnosis of influenza from Nasopharyngeal swab specimens and  should not be used as a sole basis for treatment. Nasal washings and  aspirates are unacceptable for Xpert Xpress SARS-CoV-2/FLU/RSV  testing. Fact Sheet for Patients: PinkCheek.be Fact Sheet for Healthcare Providers: GravelBags.it This test is not yet approved or cleared by the [place] FDA and  has been authorized for detection and/or diagnosis of SARS-CoV-2 by  FDA under an Emergency Use Authorization (EUA). This EUA will remain  in effect (meaning this test can be used) for the duration of the  Covid-19 declaration under Section 564(b)(1) of the Act, 21  U.S.C. section 360bbb-3(b)(1), unless the authorization is  terminated or revoked. Performed at [facility], [address] [place], [place] [address]   Surgical pcr screen     Status: Abnormal   Collection Time: 04/18/20  9:19 PM   Specimen: Nasal Mucosa; Nasal Swab  Result Value Ref Range Status   MRSA, PCR POSITIVE (A) NEGATIVE Final    Comment: RESULT CALLED TO, READ BACK BY AND VERIFIED WITH: Lendell Caprice RN 04/19/20 0048 JDW    Staphylococcus aureus POSITIVE (A) NEGATIVE Final    Comment: (NOTE) The Xpert SA Assay (FDA approved for NASAL specimens in patients 25 years of age and older), is one component of a comprehensive surveillance program. It is not intended to diagnose infection nor to guide or monitor treatment. Performed at [facility], [place] [address] [place], [place] [address]      Labs: Basic  Metabolic Panel: Recent Labs  Lab 04/16/20 0700 04/17/20 0332 04/18/20 0340 04/19/20 0326 04/20/20 0545  NA 142 144 142 142 143  K 4.8 4.3 3.8 3.7 4.5  CL 114* 111 108 105 101  CO2 15* 17* 21* 24 25  GLUCOSE 90 91 92 87 108*  BUN 83* 81* 80* 78* 80*  CREATININE 9.85* 9.00* 9.37* 9.52* 8.81*  CALCIUM 5.9* 6.6* 6.4* 6.2* 6.7*  PHOS 8.7* 7.1* 5.4* 4.7* 5.0*   Liver Function Tests: Recent Labs  Lab 04/15/20 1246 04/15/20 1246 04/16/20 0700 04/17/20 0332 04/18/20 0340 04/19/20 0326 04/20/20 0545  AST 25  --  22  --   --   --   --   ALT 17  --  15  --   --   --   --   ALKPHOS 105  --  84  --   --   --   --   BILITOT 0.5  --  0.3  --   --   --   --   PROT 7.5  --  6.1*  --   --   --   --   ALBUMIN 3.7   < > 2.9* 2.7* 2.6* 2.5* 2.6*   < > = values in this interval not displayed.   No results for input(s): LIPASE, AMYLASE in the last 168 hours. No results for input(s): AMMONIA in the last 168 hours. CBC: Recent Labs  Lab 04/15/20 1246 04/16/20 0700 04/16/20 1052 04/18/20 0340 04/19/20 0326  WBC 5.7 5.4  --  4.5 4.9  NEUTROABS 2.9  --   --   --   --   HGB 7.6* 8.1*  --  7.7* 8.4*  HCT 25.1* 26.0* 26.1* 24.2* 26.6*  MCV 102.9* 96.3  --  93.8 94.7  PLT 267 211  --  168 175   Cardiac Enzymes: No results for input(s): CKTOTAL, CKMB, CKMBINDEX, TROPONINI in the last 168 hours. BNP: BNP (last 3 results) No results for input(s): BNP in the last 8760 hours.  ProBNP (last 3 results) No results for input(s): PROBNP in the last 8760 hours.  CBG: No results for input(s): GLUCAP in the last 168 hours.     Signed:  Alma Friendly, MD [facility] 04/20/2020, 1:51 PM

## 2020-04-20 NOTE — Progress Notes (Addendum)
[facility] Progress Note    Assessment/ Plan:   1. 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 Progressive CKD stage V-  Feel is more consistent with CKD and actually ESRD.  No uremic symptoms.  Patient now with AVF and TDC placed on 5/11.  BUN 80, Cr 8.81, GFR 5.  1. S/p AVF and TDC placement, getting  First HD this AM.  Will plan for second on Thurs. Have heard from CLIP-   tts at [place] d/c tomorrow, fill out paperwork on Friday, and first outpatient HD on Saturday. Actually pt feels well and ready for discharge so will plan on sending him home today and he can present for treatment as an OP tomorrow at [facility]- discussed with all parties 2. Hypocalcemia- Given oral calcium gluconate, calcitriol and started on phoslo  1. Cont calcitriol 0.5 mcg daily. iPTH 207 2.  Correct Ca improving, 7.8 on 5/12.  No symptoms.  3. Anemia of CKD-  iron stores OK, on ESA-  inc dose. PRBC's prn 4. HTN- poorly controlled due to noncompliance with medications. To resume previous outpatient medications, amlodipine and metoprolol. 5. Pre-syncope/dizziness- possibly related to significant anemia.  6. Secondary HPTH- numbers better on phoslo and calcitriol 7. BPH- with nocturia x 6.  cont  flomax, restarted on 5/7. 8. dCHF- appears volume depleted on exam.  9. COPD- stable.  10. Tobacco abuse- ongoing.  Patient seen and examined, agree with above note with above modifications. Pt only c/o some arm pain and numbness.  S/p his first HD this AM- tolerated well- plan will be to discharge to OP unit - he can start tomorrow-  Coordinated by all-  Appreciate the renall navigator to help facilitate.  All dialysis related medications have been titrated appropriately  Corliss Parish, MD 04/20/2020       Subjective:   UOP 1L in last 24 hrs.  Currently in HD.  No acute events overnight.  Patient had TDC and AVF placed yesterday and tolerated well.  No complaints this AM.    Objective:   BP 131/68    Pulse 70   Temp 98.4 F (36.9 C) (Oral)   Resp 16   Ht 5\' 5"  (1.651 m)   Wt 80.7 kg   SpO2 100%   BMI 29.61 kg/m   Intake/Output Summary (Last 24 hours) at 04/20/2020 0857 Last data filed at 04/20/2020 0600 Gross per 24 hour  Intake 2311.67 ml  Output 1000 ml  Net 1311.67 ml   Weight change:   Physical Exam: Gen: in NAD, lying in bed, in HD CVS: RRR  Resp: CTAB, no increased WOB on RA TIW:PYKD, non-tender Ext:no edema, palpable thrill on right AVF  Imaging: DG CHEST PORT 1 VIEW  Result Date: 04/19/2020 CLINICAL DATA:  Dialysis catheter placement EXAM: PORTABLE CHEST 1 VIEW COMPARISON:  None. FINDINGS: Interval placement of right side dialysis catheter with the tip at the cavoatrial junction. No pneumothorax. Heart is normal size. Lungs are clear. No effusions or acute bony abnormality. IMPRESSION: Right dialysis catheter tip at the cavoatrial junction. No pneumothorax. No active cardiopulmonary disease. Electronically Signed   By: Rolm Baptise M.D.   On: 04/19/2020 15:50   DG Fluoro Guide CV Line-No Report  Result Date: 04/19/2020 Fluoroscopy was utilized by the requesting physician.  No radiographic interpretation.    Labs: BMET Recent Labs  Lab 04/15/20 1246 04/16/20 0700 04/17/20 0332 04/18/20 0340 04/19/20 0326 04/20/20 0545  NA 143 142 144 142 142 143  K 5.7* 4.8 4.3 3.8 3.7  4.5  CL 112* 114* 111 108 105 101  CO2 16* 15* 17* 21* 24 25  GLUCOSE 103* 90 91 92 87 108*  BUN 86* 83* 81* 80* 78* 80*  CREATININE 10.08* 9.85* 9.00* 9.37* 9.52* 8.81*  CALCIUM 6.4* 5.9* 6.6* 6.4* 6.2* 6.7*  PHOS  --  8.7* 7.1* 5.4* 4.7* 5.0*   CBC Recent Labs  Lab 04/15/20 1246 04/15/20 1246 04/16/20 0700 04/16/20 1052 04/18/20 0340 04/19/20 0326  WBC 5.7  --  5.4  --  4.5 4.9  NEUTROABS 2.9  --   --   --   --   --   HGB 7.6*  --  8.1*  --  7.7* 8.4*  HCT 25.1*   < > 26.0* 26.1* 24.2* 26.6*  MCV 102.9*  --  96.3  --  93.8 94.7  PLT 267  --  211  --  168 175   < > =  values in this interval not displayed.    Medications:    . amLODipine  5 mg Oral Daily  . calcitRIOL  0.5 mcg Oral Daily  . calcium acetate  1,334 mg Oral TID WC  . calcium carbonate  1,000 mg of elemental calcium Oral QHS  . Chlorhexidine Gluconate Cloth  6 each Topical Q0600  . Chlorhexidine Gluconate Cloth  6 each Topical Q0600  . [START ON 04/24/2020] darbepoetin (ARANESP) injection - NON-DIALYSIS  100 mcg Subcutaneous Q Sun-1800  . heparin  5,000 Units Subcutaneous Q8H  . metoprolol tartrate  25 mg Oral BID  . mupirocin ointment  1 application Nasal BID  . tamsulosin  0.4 mg Oral Daily  . umeclidinium bromide  1 puff Inhalation Daily     [place] Constable, DO [facility] Family Medicine Resident, PGY 2 04/20/2020, 8:57 AM

## 2020-04-20 NOTE — Progress Notes (Addendum)
  Progress Note    04/20/2020 10:28 AM 1 Day Post-Op  Subjective:  TDC worked well during HD treatment this morning per patient   Vitals:   04/20/20 0900 04/20/20 0911  BP: 133/74 135/69  Pulse: 74 79  Resp:  16  Temp:  98.2 F (36.8 C)  SpO2:  100%   Physical Exam Lungs:  Non labored Incisions:  TDC site unremarkable Extremities:  Palpable R radial pulse; palpable thrill near antecubitum Neurologic: A&O  CBC    Component Value Date/Time   WBC 4.9 04/19/2020 0326   RBC 2.81 (L) 04/19/2020 0326   HGB 8.4 (L) 04/19/2020 0326   HCT 26.6 (L) 04/19/2020 0326   HCT 26.1 (L) 04/16/2020 1052   PLT 175 04/19/2020 0326   MCV 94.7 04/19/2020 0326   MCH 29.9 04/19/2020 0326   MCHC 31.6 04/19/2020 0326   RDW 16.1 (H) 04/19/2020 0326   LYMPHSABS 0.8 04/15/2020 1246   MONOABS 0.4 04/15/2020 1246   EOSABS 1.6 (H) 04/15/2020 1246   BASOSABS 0.0 04/15/2020 1246    BMET    Component Value Date/Time   NA 143 04/20/2020 0545   K 4.5 04/20/2020 0545   CL 101 04/20/2020 0545   CO2 25 04/20/2020 0545   GLUCOSE 108 (H) 04/20/2020 0545   BUN 80 (H) 04/20/2020 0545   CREATININE 8.81 (H) 04/20/2020 0545   CALCIUM 6.7 (L) 04/20/2020 0545   GFRNONAA 5 (L) 04/20/2020 0545   GFRAA 6 (L) 04/20/2020 0545    INR No results found for: INR   Intake/Output Summary (Last 24 hours) at 04/20/2020 1028 Last data filed at 04/20/2020 1024 Gross per 24 hour  Intake 1840.42 ml  Output 1350 ml  Net 490.42 ml     Assessment/Plan:  69 y.o. male is s/p TDC and R arm 1st stage basilic vein fistula 1 Day Post-Op   Patent R arm AVF without signs or symptoms of steal Check fistula duplex in 4-6 weeks prior to proceeding with second stage of transposition [facility] for discharge from vascular standpoint   Dagoberto Ligas, PA-C [facility] [phone] 04/20/2020 10:28 AM  I have independently interviewed and examined patient and agree with PA assessment and plan above.    Sindy Mccune C. Donzetta Matters, MD [facility] [place] Office: [phone] Pager: [phone]

## 2020-04-20 NOTE — Anesthesia Preprocedure Evaluation (Signed)
Anesthesia Evaluation  Patient identified by MRN, date of birth, ID band Patient awake    Reviewed: Allergy & Precautions, NPO status , Patient's Chart, lab work & pertinent test results  Airway Mallampati: II  TM Distance: >3 FB Neck ROM: Full    Dental   Pulmonary Current Smoker,    breath sounds clear to auscultation       Cardiovascular hypertension,  Rhythm:Regular Rate:Normal     Neuro/Psych    GI/Hepatic   Endo/Other    Renal/GU      Musculoskeletal   Abdominal   Peds  Hematology   Anesthesia Other Findings   Reproductive/Obstetrics                             Anesthesia Physical Anesthesia Plan  ASA: III  Anesthesia Plan: MAC   Post-op Pain Management:    Induction: Intravenous  PONV Risk Score and Plan: Ondansetron and Dexamethasone  Airway Management Planned: Natural Airway and Simple Face Mask  Additional Equipment:   Intra-op Plan:   Post-operative Plan:   Informed Consent: I have reviewed the patients History and Physical, chart, labs and discussed the procedure including the risks, benefits and alternatives for the proposed anesthesia with the patient or authorized representative who has indicated his/her understanding and acceptance.       Plan Discussed with: Anesthesiologist and CRNA  Anesthesia Plan Comments:         Anesthesia Quick Evaluation

## 2020-04-20 NOTE — Plan of Care (Signed)
  Problem: Education: Goal: Knowledge of General Education information will improve Description: Including pain rating scale, medication(s)/side effects and non-pharmacologic comfort measures Outcome: Progressing   Problem: Activity: Goal: Risk for activity intolerance will decrease Outcome: Completed/Met

## 2020-04-20 NOTE — Progress Notes (Signed)
Patient cleared for discharge per Nephrologist and Primary. Navigator spoke with patient who feels comfortable with plan for discharge today and second HD treatment at his clinic tomorrow. Navigator spoke with Clinic Manager to request later seat time FOR TOMORROW ONLY in order for him to be able to sign his intake paperwork prior to his first treatment at his clinic tomorrow.  Therefore, patient will start in the clinic tomorrow, Thursday, 04/21/20. He needs to arrive at 11:30am to sign papers and then he will have HD tx following.  He will start his normal seat time of 6:45am on Saturday, 04/23/20. He needs to arrive to his normal seat at 6:30am.  Navigator has explained above and patient states understanding. Navigator offered to call patient's niece to review plan with her as well, but he states this is not necessary. Above plan, including clinic address and phone number has been provided to patient in writing (though he tells Navigator that he is illiterate-his niece can read and write). Navigator has notified Renal PA of plan and requested OP HD orders be sent to HD clinic.  Alphonzo Cruise, [place] Renal Navigator [phone]

## 2020-04-20 NOTE — Discharge Instructions (Signed)
v  [facility]  Discharge Instructions  AV Fistula or Graft Surgery for Dialysis Access  Please refer to the following instructions for your post-procedure care. Your surgeon or physician assistant will discuss any changes with you.  Activity  You may drive the day following your surgery, if you are comfortable and no longer taking prescription pain medication. Resume full activity as the soreness in your incision resolves.  Bathing/Showering  You may shower after you go home. Keep your incision dry for 48 hours. Do not soak in a bathtub, hot tub, or swim until the incision heals completely. You may not shower if you have a hemodialysis catheter.  Incision Care  Clean your incision with mild soap and water after 48 hours. Pat the area dry with a clean towel. You do not need a bandage unless otherwise instructed. Do not apply any ointments or creams to your incision. You may have skin glue on your incision. Do not peel it off. It will come off on its own in about one week. Your arm may swell a bit after surgery. To reduce swelling use pillows to elevate your arm so it is above your heart. Your doctor will tell you if you need to lightly wrap your arm with an ACE bandage.  Diet  Resume your normal diet. There are not special food restrictions following this procedure. In order to heal from your surgery, it is CRITICAL to get adequate nutrition. Your body requires vitamins, minerals, and protein. Vegetables are the best source of vitamins and minerals. Vegetables also provide the perfect balance of protein. Processed food has little nutritional value, so try to avoid this.  Medications  Resume taking all of your medications. If your incision is causing pain, you may take over-the counter pain relievers such as acetaminophen (Tylenol). If you were prescribed a stronger pain medication, please be aware these medications can cause nausea and constipation. Prevent  nausea by taking the medication with a snack or meal. Avoid constipation by drinking plenty of fluids and eating foods with high amount of fiber, such as fruits, vegetables, and grains. Do not take Tylenol if you are taking prescription pain medications.     Follow up Your surgeon may want to see you in the office following your access surgery. If so, this will be arranged at the time of your surgery.  Please call us immediately for any of the following conditions:  Increased pain, redness, drainage (pus) from your incision site Fever of 101 degrees or higher Severe or worsening pain at your incision site Hand pain or numbness.  Reduce your risk of vascular disease:  Stop smoking. If you would like help, call [facility] at 1-800-QUIT-NOW 317-036-4693) or [place] at [place] your cholesterol Maintain a desired weight Control your diabetes Keep your blood pressure down  Dialysis  It will take several weeks to several months for your new dialysis access to be ready for use. Your surgeon will determine when it is OK to use it. Your nephrologist will continue to direct your dialysis. You can continue to use your Permcath until your new access is ready for use.  If you have any questions, please call the office at [phone].

## 2020-04-25 DIAGNOSIS — B182 Chronic viral hepatitis C: Secondary | ICD-10-CM | POA: Insufficient documentation

## 2020-04-25 DIAGNOSIS — E46 Unspecified protein-calorie malnutrition: Secondary | ICD-10-CM | POA: Insufficient documentation

## 2020-05-11 ENCOUNTER — Encounter: Payer: Self-pay | Admitting: Family Medicine

## 2020-05-11 ENCOUNTER — Other Ambulatory Visit: Payer: Self-pay

## 2020-05-11 ENCOUNTER — Ambulatory Visit (INDEPENDENT_AMBULATORY_CARE_PROVIDER_SITE_OTHER): Payer: Medicare Other | Admitting: Family Medicine

## 2020-05-11 VITALS — BP 124/63 | HR 81 | Temp 98.2°F | Ht 65.0 in | Wt 177.0 lb

## 2020-05-11 DIAGNOSIS — N186 End stage renal disease: Secondary | ICD-10-CM | POA: Diagnosis not present

## 2020-05-11 DIAGNOSIS — I151 Hypertension secondary to other renal disorders: Secondary | ICD-10-CM

## 2020-05-11 DIAGNOSIS — N2889 Other specified disorders of kidney and ureter: Secondary | ICD-10-CM | POA: Diagnosis not present

## 2020-05-11 DIAGNOSIS — J449 Chronic obstructive pulmonary disease, unspecified: Secondary | ICD-10-CM | POA: Diagnosis not present

## 2020-05-11 DIAGNOSIS — Z992 Dependence on renal dialysis: Secondary | ICD-10-CM

## 2020-05-11 NOTE — Progress Notes (Signed)
New Patient Office Visit  Assessment & Plan:  1. Hypertension secondary to other renal disorders - Well controlled on current regimen, although we don't know what this is.  2. Chronic kidney disease requiring chronic dialysis ([place]) - Managed by nephrology.  3. Chronic obstructive pulmonary disease, unspecified COPD type ([place]) - Educated on proper usage of inhalers. He is adamant he doesn't need either one of them.    Patient will have his niece call back with his list of medications.    Follow-up: Return in about 3 months (around 08/11/2020) for follow-up of chronic medication conditions.   [name], MSN, APRN, FNP-C [facility]  Subjective:  Patient ID: Elston Aldape, male    DOB: [birth year]  Age: 69 y.o. MRN: [id number]  Patient Care Team: [name], FNP as PCP - General (Family Medicine)  CC:  Chief Complaint  Patient presents with  . New Patient (Initial Visit)    Patient states that he does not previous pcp- goes to hospital if he needs anything.  . Establish Care    HPI Darryel Diodato presents to establish care.  Patient gets dialysis in [place], [place], and Sat. This just started last month.   Patient has COPD but states he does not use either of the inhalers regularly. If he feels short of breath he uses the Spiriva. States he never uses the Albuterol.    Review of Systems  Constitutional: Negative for chills, fever, malaise/fatigue and weight loss.  HENT: Negative for congestion, ear discharge, ear pain, nosebleeds, sinus pain, sore throat and tinnitus.   Eyes: Negative for blurred vision, double vision, pain, discharge and redness.  Respiratory: Negative for cough, shortness of breath and wheezing.   Cardiovascular: Negative for chest pain, palpitations and leg swelling.  Gastrointestinal: Negative for abdominal pain, constipation, diarrhea, heartburn, nausea and vomiting.  Genitourinary: Negative for dysuria, frequency and  urgency.  Musculoskeletal: Negative for myalgias.  Skin: Negative for rash.  Neurological: Negative for dizziness, seizures, weakness and headaches.  Psychiatric/Behavioral: Negative for depression, substance abuse and suicidal ideas. The patient is not nervous/anxious.     Current Outpatient Medications:  .  albuterol (VENTOLIN HFA) 108 (90 Base) MCG/ACT inhaler, Inhale 2 puffs into the lungs every 6 (six) hours as needed for wheezing or shortness of breath., Disp: 6.7 g, Rfl: 0 .  amLODipine (NORVASC) 5 MG tablet, Take 1 tablet (5 mg total) by mouth daily., Disp: 30 tablet, Rfl: 0 .  aspirin EC 325 MG tablet, Take 325 mg by mouth daily. , Disp: , Rfl:  .  calcitRIOL (ROCALTROL) 0.5 MCG capsule, Take 1 capsule (0.5 mcg total) by mouth daily., Disp: 30 capsule, Rfl: 0 .  calcium acetate (PHOSLO) 667 MG capsule, Take 2 capsules (1,334 mg total) by mouth 3 (three) times daily with meals., Disp: 180 capsule, Rfl: 0 .  calcium carbonate (OS-CAL - DOSED IN MG OF ELEMENTAL CALCIUM) 1250 (500 Ca) MG tablet, Take 1 tablet (500 mg of elemental calcium total) by mouth at bedtime., Disp: 30 tablet, Rfl: 0 .  metoprolol tartrate (LOPRESSOR) 25 MG tablet, Take 1 tablet (25 mg total) by mouth 2 (two) times daily., Disp: 60 tablet, Rfl: 0 .  tamsulosin (FLOMAX) 0.4 MG CAPS capsule, Take 1 capsule (0.4 mg total) by mouth daily., Disp: 30 capsule, Rfl: 0 .  tiotropium (SPIRIVA HANDIHALER) 18 MCG inhalation capsule, Place 1 capsule (18 mcg total) into inhaler and inhale daily., Disp: 30 capsule, Rfl: 0  Allergies  Allergen  Reactions  . Strawberry (Diagnostic)     Past Medical History:  Diagnosis Date  . CHF (congestive heart failure) ([place])   . COPD (chronic obstructive pulmonary disease) ([place])   . Hypertension   . Pyelonephritis 09/11/2017  . Renal disorder     Past Surgical History:  Procedure Laterality Date  . BASCILIC VEIN TRANSPOSITION Right 04/19/2020   Procedure: Basilic Vein Transposition  Right Arm;  Surgeon: Waynetta Sandy, MD;  Location: [place];  Service: Vascular;  Laterality: Right;  . INSERTION OF DIALYSIS CATHETER Right 04/19/2020   Procedure: Insertion Of Right Internal Jugular Dialysis Catheter;  Surgeon: Waynetta Sandy, MD;  Location: [facility] OR;  Service: Vascular;  Laterality: Right;    Family History  Problem Relation Age of Onset  . Alzheimer's disease Mother     Social History   Socioeconomic History  . Marital status: Single    Spouse name: Not on file  . Number of children: Not on file  . Years of education: Not on file  . Highest education level: Not on file  Occupational History  . Not on file  Tobacco Use  . Smoking status: Current Every Day Smoker    Packs/day: 0.50  . Smokeless tobacco: Never Used  Substance and Sexual Activity  . Alcohol use: No  . Drug use: No  . Sexual activity: Not Currently    Birth control/protection: None  Other Topics Concern  . Not on file  Social History Narrative  . Not on file   Social Determinants of Health   Financial Resource Strain:   . Difficulty of Paying Living Expenses:   Food Insecurity:   . Worried About Charity fundraiser in the Last Year:   . Arboriculturist in the Last Year:   Transportation Needs:   . Film/video editor (Medical):   Marland Kitchen Lack of Transportation (Non-Medical):   Physical Activity:   . Days of Exercise per Week:   . Minutes of Exercise per Session:   Stress:   . Feeling of Stress :   Social Connections:   . Frequency of Communication with Friends and Family:   . Frequency of Social Gatherings with Friends and Family:   . Attends Religious Services:   . Active Member of Clubs or Organizations:   . Attends Archivist Meetings:   Marland Kitchen Marital Status:   Intimate Partner Violence:   . Fear of Current or Ex-Partner:   . Emotionally Abused:   Marland Kitchen Physically Abused:   . Sexually Abused:     Objective:   Today's Vitals: BP 124/63   Pulse 81    Temp 98.2 F (36.8 C) (Temporal)   Ht 5\' 5"  (1.651 m)   Wt 177 lb (80.3 kg)   SpO2 98%   BMI 29.45 kg/m   Physical Exam Vitals reviewed.  Constitutional:      General: He is not in acute distress.    Appearance: Normal appearance. He is overweight. He is not ill-appearing, toxic-appearing or diaphoretic.  HENT:     Head: Normocephalic and atraumatic.  Eyes:     General: No scleral icterus.       Right eye: No discharge.        Left eye: No discharge.     Conjunctiva/sclera: Conjunctivae normal.  Cardiovascular:     Rate and Rhythm: Normal rate and regular rhythm.     Heart sounds: Normal heart sounds. No murmur. No friction rub. No gallop.   Pulmonary:  Effort: Pulmonary effort is normal. No respiratory distress.     Breath sounds: Normal breath sounds. No stridor. No wheezing, rhonchi or rales.  Musculoskeletal:        General: Normal range of motion.     Cervical back: Normal range of motion.  Skin:    General: Skin is warm and dry.  Neurological:     Mental Status: He is alert and oriented to person, place, and time. Mental status is at baseline.  Psychiatric:        Mood and Affect: Mood normal.        Behavior: Behavior normal.        Thought Content: Thought content normal.        Judgment: Judgment normal.

## 2020-05-18 ENCOUNTER — Encounter: Payer: Self-pay | Admitting: *Deleted

## 2020-05-26 ENCOUNTER — Other Ambulatory Visit: Payer: Self-pay | Admitting: *Deleted

## 2020-05-26 DIAGNOSIS — N186 End stage renal disease: Secondary | ICD-10-CM

## 2020-06-03 ENCOUNTER — Encounter (HOSPITAL_COMMUNITY): Payer: Medicare Other

## 2020-06-29 DIAGNOSIS — T782XXA Anaphylactic shock, unspecified, initial encounter: Secondary | ICD-10-CM | POA: Insufficient documentation

## 2020-06-29 DIAGNOSIS — T7840XA Allergy, unspecified, initial encounter: Secondary | ICD-10-CM | POA: Insufficient documentation

## 2020-07-07 ENCOUNTER — Other Ambulatory Visit: Payer: Self-pay

## 2020-07-07 DIAGNOSIS — N186 End stage renal disease: Secondary | ICD-10-CM

## 2020-07-13 ENCOUNTER — Ambulatory Visit (HOSPITAL_COMMUNITY)
Admission: RE | Admit: 2020-07-13 | Discharge: 2020-07-13 | Disposition: A | Discharge status: Home / Self Care | Payer: Medicare Other | Source: Ambulatory Visit | Attending: Vascular Surgery | Admitting: Vascular Surgery

## 2020-07-13 ENCOUNTER — Ambulatory Visit (INDEPENDENT_AMBULATORY_CARE_PROVIDER_SITE_OTHER): Payer: Self-pay | Admitting: Physician Assistant

## 2020-07-13 ENCOUNTER — Other Ambulatory Visit: Payer: Self-pay

## 2020-07-13 VITALS — BP 174/84 | HR 77 | Temp 98.4°F | Resp 20 | Ht 65.0 in | Wt 180.9 lb

## 2020-07-13 DIAGNOSIS — N186 End stage renal disease: Secondary | ICD-10-CM

## 2020-07-13 DIAGNOSIS — Z992 Dependence on renal dialysis: Secondary | ICD-10-CM | POA: Diagnosis present

## 2020-07-13 NOTE — Progress Notes (Signed)
Postoperative Access Visit   History of Present Illness   Kerry Underwood is a 69 y.o. year old male who presents for postoperative follow-up for: right IJ [facility] and right upper extremity first stage basilic vein fistula by Dr. Donzetta Matters 04/19/20. The patient's wounds are well healed.  The patient notes numbness in his right 2nd and 3rd fingers and coldness to touch. No other steal symptoms.  He denies any weakness, or loss of motor in right arm or hand. He does not have any pain in right upper extremity. The patient is able to complete their activities of daily living although he says he has lost 2-3 seconds off of his "drag", which he says he is not happy about. [place] is very active[place] and [place] of many animals   He currently is dialyzing via right IJ TDC TTS at [facility] [place]  Physical Examination   Vitals:   07/13/20 1342  BP: (!) 174/84  Pulse: 77  Resp: 20  Temp: 98.4 F (36.9 C)  TempSrc: Temporal  SpO2: 98%  Weight: 180 lb 14.4 oz (82.1 kg)  Height: 5\' 5"  (1.651 m)   Body mass index is 30.1 kg/m.  right arm Incision is well healed, 1+ radial pulse, hand grip is 5/5, sensation in digits is not intact, palpable thrill, bruit can be auscultated. Doppler Radial signal. Diminished monophasic ulnar and palmar signals. Right hand is warm but cooler than left hand    Non invasive vascular lab study:  +------------+----------+-------------+----------+--------+  OUTFLOW VEINPSV (cm/s)Diameter (cm)Depth (cm)Describe  +------------+----------+-------------+----------+--------+  Prox UA     63    1.35     1.78        +------------+----------+-------------+----------+--------+  Mid UA     95    1.02     1.39        +------------+----------+-------------+----------+--------+  Dist UA     84    1.05     0.86        +------------+----------+-------------+----------+--------+    AC Fossa    342    0.72     0.20        +------------+----------+-------------+----------+--------+  Right second digit pressure by ppg 82 mm Hg. With AVF compression 161 mm  Hg.   Medical Decision Making   Kerry Underwood is a 69 y.o. year old male who presents s/p right IJ [facility] and right upper extremity first stage basilic vein fistula by Dr. Donzetta Matters 5/11/2. Duplex evaluation shows adequate diameter and patent right BV AV fistula however it is too deep in the right upper arm. Patient is also complaining of numbness in right 2nd and 3rd fingers and coldness. He denies any pain in right upper extremity. On duplex the digital pressures are improved with compression of the fistula. He is a chronic smoker 60 pack year history so likely already has poor arterial flow into right hand. Doppler Radial signal. Diminished monophasic ulnar and palmar signals. Right hand is warm but cooler than left hand. Presently his symptoms are tolerable.  Patent with some signs or symptoms of steal syndrome- numbness and coldness in his right 2nd and 3rd fingers. He is presently without pain or tissue loss  He will continue to dialyze via right IJ TDC TTS at [facility]  I have scheduled him for a right second stage Basilic vein transposition and possible banding with Dr. Jeri Lager, PA-C [facility] [place] Office: [phone]  Clinic MD:  Dr. Oneida Alar

## 2020-07-28 ENCOUNTER — Other Ambulatory Visit: Payer: Self-pay

## 2020-08-11 ENCOUNTER — Encounter: Payer: Self-pay | Admitting: Family Medicine

## 2020-08-11 ENCOUNTER — Ambulatory Visit: Payer: Medicare Other | Admitting: Family Medicine

## 2020-08-12 ENCOUNTER — Ambulatory Visit: Payer: Medicare Other | Admitting: Family Medicine

## 2020-08-13 ENCOUNTER — Inpatient Hospital Stay (HOSPITAL_COMMUNITY): Admission: RE | Admit: 2020-08-13 | Payer: Medicare Other | Source: Ambulatory Visit

## 2020-08-16 ENCOUNTER — Encounter (HOSPITAL_COMMUNITY): Payer: Self-pay | Admitting: Vascular Surgery

## 2020-08-16 ENCOUNTER — Telehealth: Payer: Self-pay

## 2020-08-16 ENCOUNTER — Other Ambulatory Visit: Payer: Self-pay

## 2020-08-16 NOTE — Progress Notes (Signed)
Pt denies SOB, chest pain, and being under the care of a cardiologist. Pt stated that PCP is Delight Ovens, [place]. Pt denies having a stress test and cardiac cath. Pt denies having an EKG in the last year. Pt made aware to stop taking vitamins, fish oil and herbal medications. Do not take any NSAIDs ie: Ibuprofen, Advil, Naproxen (Aleve), Motrin, BC and Goody Powder. Pt stated that he will bring in medications and inhalers on DOS. Pt reminded to quarantine. Pt verbalized understanding of all pre-op instructions.

## 2020-08-16 NOTE — Telephone Encounter (Signed)
Received call from Sherlynn Stalls, nurse at [facility] preadmission stating pt informed her he didn't have a support person, driver or anyone able to stay with him after surgery on tomorrow. She inquired if able to admit pt for observation.   Spoke with pt regarding the above requirements in order to proceed with 2nd stage BVT surgery. Pt stated he didn't have anyone at this time and would have to arrange someone but would take some time. Pt requested to call back to reschedule once plan available.

## 2020-08-16 NOTE — Progress Notes (Signed)
Nurse spoke with Gretchen Short, RN, to make MD aware that pt has no driver post-op. [place] stated that she will follow up with pt after she speaks with MD.

## 2020-08-17 ENCOUNTER — Ambulatory Visit (HOSPITAL_COMMUNITY): Admission: RE | Admit: 2020-08-17 | Payer: Medicare Other | Source: Home / Self Care | Admitting: Vascular Surgery

## 2020-08-17 HISTORY — DX: Anemia, unspecified: D64.9

## 2020-08-17 HISTORY — DX: Complete loss of teeth, unspecified cause, unspecified class: K08.109

## 2020-08-17 HISTORY — DX: Complete loss of teeth, unspecified cause, unspecified class: Z97.2

## 2020-08-17 HISTORY — DX: Cerebral infarction, unspecified: I63.9

## 2020-08-17 SURGERY — TRANSPOSITION, VEIN, BASILIC
Anesthesia: Monitor Anesthesia Care | Laterality: Right

## 2020-09-01 ENCOUNTER — Telehealth: Payer: Self-pay

## 2020-09-01 NOTE — Telephone Encounter (Signed)
Spoke with pt to r/s for surgery with Dr. Donzetta Matters on 09/21/20. Pt stated he will attempt to have his daughter as driver/support person and will contact office back if this is not possible. Covid test rescheduled for 09/19/20. Pt declined to have instructions letter sent due to inability to read/write. Directions and landmarks provided to facility. Pt verbalized understanding, stating he was familiar with the area.

## 2020-09-17 ENCOUNTER — Other Ambulatory Visit: Payer: Self-pay

## 2020-09-17 ENCOUNTER — Emergency Department (HOSPITAL_COMMUNITY): Payer: Medicare Other

## 2020-09-17 ENCOUNTER — Inpatient Hospital Stay (HOSPITAL_COMMUNITY)
Admission: EM | Admit: 2020-09-17 | Discharge: 2020-09-18 | DRG: 193 | Discharge status: Against Medical Advice | Payer: Medicare Other | Attending: Family Medicine | Admitting: Family Medicine

## 2020-09-17 ENCOUNTER — Encounter (HOSPITAL_COMMUNITY): Payer: Self-pay | Admitting: Emergency Medicine

## 2020-09-17 DIAGNOSIS — N186 End stage renal disease: Secondary | ICD-10-CM | POA: Diagnosis present

## 2020-09-17 DIAGNOSIS — Z5329 Procedure and treatment not carried out because of patient's decision for other reasons: Secondary | ICD-10-CM | POA: Diagnosis not present

## 2020-09-17 DIAGNOSIS — F17210 Nicotine dependence, cigarettes, uncomplicated: Secondary | ICD-10-CM | POA: Diagnosis present

## 2020-09-17 DIAGNOSIS — Z820 Family history of epilepsy and other diseases of the nervous system: Secondary | ICD-10-CM

## 2020-09-17 DIAGNOSIS — R0902 Hypoxemia: Secondary | ICD-10-CM | POA: Diagnosis present

## 2020-09-17 DIAGNOSIS — Z992 Dependence on renal dialysis: Secondary | ICD-10-CM | POA: Diagnosis present

## 2020-09-17 DIAGNOSIS — Z66 Do not resuscitate: Secondary | ICD-10-CM | POA: Diagnosis present

## 2020-09-17 DIAGNOSIS — Z9115 Patient's noncompliance with renal dialysis: Secondary | ICD-10-CM | POA: Diagnosis not present

## 2020-09-17 DIAGNOSIS — D631 Anemia in chronic kidney disease: Secondary | ICD-10-CM | POA: Diagnosis not present

## 2020-09-17 DIAGNOSIS — J441 Chronic obstructive pulmonary disease with (acute) exacerbation: Secondary | ICD-10-CM | POA: Diagnosis present

## 2020-09-17 DIAGNOSIS — J440 Chronic obstructive pulmonary disease with acute lower respiratory infection: Secondary | ICD-10-CM | POA: Diagnosis present

## 2020-09-17 DIAGNOSIS — Z7982 Long term (current) use of aspirin: Secondary | ICD-10-CM | POA: Diagnosis not present

## 2020-09-17 DIAGNOSIS — I132 Hypertensive heart and chronic kidney disease with heart failure and with stage 5 chronic kidney disease, or end stage renal disease: Secondary | ICD-10-CM | POA: Diagnosis present

## 2020-09-17 DIAGNOSIS — J449 Chronic obstructive pulmonary disease, unspecified: Secondary | ICD-10-CM | POA: Diagnosis present

## 2020-09-17 DIAGNOSIS — I509 Heart failure, unspecified: Secondary | ICD-10-CM | POA: Diagnosis present

## 2020-09-17 DIAGNOSIS — I151 Hypertension secondary to other renal disorders: Secondary | ICD-10-CM

## 2020-09-17 DIAGNOSIS — N185 Chronic kidney disease, stage 5: Secondary | ICD-10-CM

## 2020-09-17 DIAGNOSIS — N2889 Other specified disorders of kidney and ureter: Secondary | ICD-10-CM

## 2020-09-17 DIAGNOSIS — J189 Pneumonia, unspecified organism: Secondary | ICD-10-CM | POA: Diagnosis present

## 2020-09-17 DIAGNOSIS — Z20822 Contact with and (suspected) exposure to covid-19: Secondary | ICD-10-CM | POA: Diagnosis present

## 2020-09-17 DIAGNOSIS — N189 Chronic kidney disease, unspecified: Secondary | ICD-10-CM | POA: Diagnosis present

## 2020-09-17 DIAGNOSIS — Z79899 Other long term (current) drug therapy: Secondary | ICD-10-CM | POA: Diagnosis not present

## 2020-09-17 LAB — CBC WITH DIFFERENTIAL/PLATELET
Abs Immature Granulocytes: 0.2 10*3/uL — ABNORMAL HIGH (ref 0.00–0.07)
Basophils Absolute: 0.1 10*3/uL (ref 0.0–0.1)
Basophils Relative: 0 %
Eosinophils Absolute: 0.2 10*3/uL (ref 0.0–0.5)
Eosinophils Relative: 2 %
HCT: 32.5 % — ABNORMAL LOW (ref 39.0–52.0)
Hemoglobin: 10.5 g/dL — ABNORMAL LOW (ref 13.0–17.0)
Immature Granulocytes: 1 %
Lymphocytes Relative: 6 %
Lymphs Abs: 0.9 10*3/uL (ref 0.7–4.0)
MCH: 30.8 pg (ref 26.0–34.0)
MCHC: 32.3 g/dL (ref 30.0–36.0)
MCV: 95.3 fL (ref 80.0–100.0)
Monocytes Absolute: 0.8 10*3/uL (ref 0.1–1.0)
Monocytes Relative: 5 %
Neutro Abs: 12 10*3/uL — ABNORMAL HIGH (ref 1.7–7.7)
Neutrophils Relative %: 86 %
Platelets: 410 10*3/uL — ABNORMAL HIGH (ref 150–400)
RBC: 3.41 MIL/uL — ABNORMAL LOW (ref 4.22–5.81)
RDW: 15.2 % (ref 11.5–15.5)
WBC: 14 10*3/uL — ABNORMAL HIGH (ref 4.0–10.5)
nRBC: 0 % (ref 0.0–0.2)

## 2020-09-17 LAB — BASIC METABOLIC PANEL
Anion gap: 17 — ABNORMAL HIGH (ref 5–15)
BUN: 85 mg/dL — ABNORMAL HIGH (ref 8–23)
CO2: 19 mmol/L — ABNORMAL LOW (ref 22–32)
Calcium: 7.6 mg/dL — ABNORMAL LOW (ref 8.9–10.3)
Chloride: 101 mmol/L (ref 98–111)
Creatinine, Ser: 12.22 mg/dL — ABNORMAL HIGH (ref 0.61–1.24)
GFR, Estimated: 4 mL/min — ABNORMAL LOW (ref >60)
Glucose, Bld: 96 mg/dL (ref 70–99)
Potassium: 4.7 mmol/L (ref 3.5–5.1)
Sodium: 137 mmol/L (ref 135–145)

## 2020-09-17 LAB — TROPONIN I (HIGH SENSITIVITY)
Troponin I (High Sensitivity): 25 ng/L — ABNORMAL HIGH (ref <18)
Troponin I (High Sensitivity): 24 ng/L — ABNORMAL HIGH (ref <18)

## 2020-09-17 LAB — RESP PANEL BY RT PCR (RSV, FLU A&B, COVID)
Influenza A by PCR: NEGATIVE
Influenza B by PCR: NEGATIVE
Respiratory Syncytial Virus by PCR: NEGATIVE
SARS Coronavirus 2 by RT PCR: NEGATIVE

## 2020-09-17 MED ORDER — METHYLPREDNISOLONE SODIUM SUCC 125 MG IJ SOLR
125.0000 mg | Freq: Once | INTRAMUSCULAR | Status: AC
  Administered 2020-09-17: 125 mg via INTRAVENOUS
  Filled 2020-09-17: qty 2

## 2020-09-17 MED ORDER — SODIUM CHLORIDE 0.9 % IV SOLN: 2.0000 g | INTRAVENOUS | Status: DC

## 2020-09-17 MED ORDER — SODIUM CHLORIDE 0.9 % IV SOLN
500.0000 mg | Freq: Once | INTRAVENOUS | Status: AC
  Administered 2020-09-17: 500 mg via INTRAVENOUS
  Filled 2020-09-17: qty 500

## 2020-09-17 MED ORDER — ONDANSETRON HCL 4 MG PO TABS: 4.0000 mg | ORAL_TABLET | Freq: Four times a day (QID) | ORAL | Status: DC | PRN

## 2020-09-17 MED ORDER — SODIUM CHLORIDE 0.9 % IV SOLN
1.0000 g | Freq: Once | INTRAVENOUS | Status: AC
  Administered 2020-09-17: 1 g via INTRAVENOUS
  Filled 2020-09-17: qty 10

## 2020-09-17 MED ORDER — ALBUTEROL SULFATE HFA 108 (90 BASE) MCG/ACT IN AERS: 2.0000 | INHALATION_SPRAY | Freq: Four times a day (QID) | RESPIRATORY_TRACT | Status: DC | PRN

## 2020-09-17 MED ORDER — IPRATROPIUM-ALBUTEROL 0.5-2.5 (3) MG/3ML IN SOLN
3.0000 mL | Freq: Four times a day (QID) | RESPIRATORY_TRACT | Status: DC
  Administered 2020-09-17: 3 mL via RESPIRATORY_TRACT
  Filled 2020-09-17: qty 3

## 2020-09-17 MED ORDER — SODIUM CHLORIDE 0.9 % IV SOLN: 500.0000 mg | INTRAVENOUS | Status: DC

## 2020-09-17 MED ORDER — ONDANSETRON HCL 4 MG/2ML IJ SOLN
4.0000 mg | Freq: Four times a day (QID) | INTRAMUSCULAR | Status: DC | PRN
  Administered 2020-09-17: 4 mg via INTRAVENOUS
  Filled 2020-09-17: qty 2

## 2020-09-17 MED ORDER — IPRATROPIUM-ALBUTEROL 0.5-2.5 (3) MG/3ML IN SOLN
3.0000 mL | Freq: Once | RESPIRATORY_TRACT | Status: AC
  Administered 2020-09-17: 3 mL via RESPIRATORY_TRACT
  Filled 2020-09-17: qty 3

## 2020-09-17 MED ORDER — PREDNISONE 20 MG PO TABS: 40.0000 mg | ORAL_TABLET | Freq: Every day | ORAL | Status: DC

## 2020-09-17 MED ORDER — ALBUTEROL SULFATE (2.5 MG/3ML) 0.083% IN NEBU
2.5000 mg | INHALATION_SOLUTION | Freq: Once | RESPIRATORY_TRACT | Status: AC
  Administered 2020-09-17: 2.5 mg via RESPIRATORY_TRACT
  Filled 2020-09-17: qty 3

## 2020-09-17 MED ORDER — TAMSULOSIN HCL 0.4 MG PO CAPS
0.4000 mg | ORAL_CAPSULE | Freq: Every day | ORAL | Status: DC
  Administered 2020-09-17: 0.4 mg via ORAL
  Filled 2020-09-17: qty 1

## 2020-09-17 MED ORDER — ENOXAPARIN SODIUM 40 MG/0.4ML ~~LOC~~ SOLN
40.0000 mg | SUBCUTANEOUS | Status: DC
  Administered 2020-09-17: 40 mg via SUBCUTANEOUS
  Filled 2020-09-17: qty 0.4

## 2020-09-17 MED ORDER — ALBUTEROL SULFATE (2.5 MG/3ML) 0.083% IN NEBU: 2.5000 mg | INHALATION_SOLUTION | RESPIRATORY_TRACT | Status: DC | PRN

## 2020-09-17 MED ORDER — METHYLPREDNISOLONE SODIUM SUCC 125 MG IJ SOLR
60.0000 mg | Freq: Four times a day (QID) | INTRAMUSCULAR | Status: DC
  Administered 2020-09-17 – 2020-09-18 (×2): 60 mg via INTRAVENOUS
  Filled 2020-09-17 (×2): qty 2

## 2020-09-17 MED ORDER — ALBUTEROL (5 MG/ML) CONTINUOUS INHALATION SOLN
10.0000 mg/h | INHALATION_SOLUTION | Freq: Once | RESPIRATORY_TRACT | Status: AC
  Administered 2020-09-17: 10 mg/h via RESPIRATORY_TRACT
  Filled 2020-09-17: qty 20

## 2020-09-17 MED ORDER — FLUTICASONE FUROATE-VILANTEROL 100-25 MCG/INH IN AEPB
1.0000 | INHALATION_SPRAY | Freq: Every day | RESPIRATORY_TRACT | Status: DC
  Administered 2020-09-17: 21:00:00 1 via RESPIRATORY_TRACT
  Filled 2020-09-17: qty 28

## 2020-09-17 NOTE — H&P (Signed)
History and Physical  Kerry Underwood KCM:[id number] DOB: 03-13-1951 DOA: 09/17/2020  Referring physician: Landis Martins, ED provider PCP: Kerry Brooklyn, FNP  Outpatient Specialists:   Patient Coming From: home  Chief Complaint: Cough, SOB  HPI: Kerry Underwood is a 69 y.o. male with a history of COPD, end-stage renal disease on dialysis, history of CHF, hypertension.  Patient seen for 6 days of shortness of breath and cough.  He picked his granddaughter up from school due to her being sick and since that time, has been coughing, feeling short of breath.  He has been feeling so ill that he missed his last 3 dialysis appointments.  Denies fevers, chills.  Has diminished appetite.  Has been wheezing.  Emergency Department Course: White count of 14.  Hypoxic to low 90s on presentation.  Checks x-ray consistent with pneumonia.  Covid negative  Review of Systems:   Pt denies any fevers, chills, nausea, vomiting, diarrhea, constipation, abdominal pain, palpitations, headache, vision changes, lightheadedness, dizziness, melena, rectal bleeding.  Review of systems are otherwise negative  Past Medical History:  Diagnosis Date  . Anemia   . CHF (congestive heart failure) ([place])   . COPD (chronic obstructive pulmonary disease) ([place])   . Full dentures   . Hypertension   . Pyelonephritis 09/11/2017  . Renal disorder   . Stroke [facility])    " A few mini strokes, I didn't know it "   Past Surgical History:  Procedure Laterality Date  . BASCILIC VEIN TRANSPOSITION Right 04/19/2020   Procedure: Basilic Vein Transposition Right Arm;  Surgeon: Waynetta Sandy, MD;  Location: [place];  Service: Vascular;  Laterality: Right;  . INSERTION OF DIALYSIS CATHETER Right 04/19/2020   Procedure: Insertion Of Right Internal Jugular Dialysis Catheter;  Surgeon: Waynetta Sandy, MD;  Location: [place];  Service: Vascular;  Laterality: Right;  . MULTIPLE TOOTH EXTRACTIONS     Social History:   reports that he has been smoking cigarettes. He has been smoking about 0.50 packs per day. He has quit using smokeless tobacco.  His smokeless tobacco use included chew. He reports previous drug use. Drug: Marijuana. He reports that he does not drink alcohol. Patient lives at home  Allergies  Allergen Reactions  . Strawberry (Diagnostic)     Family History  Problem Relation Age of Onset  . Alzheimer's disease Mother       Prior to Admission medications   Medication Sig Start Date End Date Taking? Authorizing Provider  albuterol (VENTOLIN HFA) 108 (90 Base) MCG/ACT inhaler Inhale 2 puffs into the lungs every 6 (six) hours as needed for wheezing or shortness of breath. 04/20/20 05/20/20  Alma Friendly, MD  amLODipine (NORVASC) 5 MG tablet Take 1 tablet (5 mg total) by mouth daily. 04/21/20 05/21/20  Alma Friendly, MD  aspirin EC 325 MG tablet Take 325 mg by mouth daily.     [provider]  calcium carbonate (OS-CAL - DOSED IN MG OF ELEMENTAL CALCIUM) 1250 (500 Ca) MG tablet Take 1 tablet (500 mg of elemental calcium total) by mouth at bedtime. 04/20/20 05/20/20  Alma Friendly, MD  metoprolol tartrate (LOPRESSOR) 25 MG tablet Take 1 tablet (25 mg total) by mouth 2 (two) times daily. 04/20/20 05/20/20  Alma Friendly, MD  tamsulosin (FLOMAX) 0.4 MG CAPS capsule Take 0.4 mg by mouth at bedtime. 05/24/20   [provider]  tiotropium (SPIRIVA HANDIHALER) 18 MCG inhalation capsule Place 1 capsule (18 mcg total) into inhaler  and inhale daily. 04/20/20 05/20/20  Alma Friendly, MD    Physical Exam: BP (!) 153/68   Pulse (!) 103   Temp 98.7 F (37.1 C) (Oral)   Resp 17   Ht 5\' 5"  (1.651 m)   Wt 80.6 kg   SpO2 100%   BMI 29.55 kg/m   . General: Elderly male. Awake and alert and oriented x3. No acute cardiopulmonary distress.  Marland Kitchen HEENT: Normocephalic atraumatic.  Right and left ears normal in appearance.  Pupils equal, round, reactive to light.  Extraocular muscles are intact. Sclerae anicteric and noninjected.  Moist mucosal membranes. No mucosal lesions.  . Neck: Neck supple without lymphadenopathy. No carotid bruits. No masses palpated.  . Cardiovascular: Regular rate with normal S1-S2 sounds. No murmurs, rubs, gallops auscultated. No JVD.  Marland Kitchen Respiratory: Coarse expiratory wheezing and rales in bases.  No accessory muscle use. . Abdomen: Soft, nontender, nondistended. Active bowel sounds. No masses or hepatosplenomegaly  . Skin: No rashes, lesions, or ulcerations.  Dry, warm to touch. 2+ dorsalis pedis and radial pulses. . Musculoskeletal: No calf or leg pain. All major joints not erythematous nontender.  No upper or lower joint deformation.  Good ROM.  No contractures  . Psychiatric: Intact judgment and insight. Pleasant and cooperative. . Neurologic: No focal neurological deficits. Strength is 5/5 and symmetric in upper and lower extremities.  Cranial nerves II through XII are grossly intact.           Labs on Admission: I have personally reviewed following labs and imaging studies  CBC: Recent Labs  Lab 09/17/20 1524  WBC 14.0*  NEUTROABS 12.0*  HGB 10.5*  HCT 32.5*  MCV 95.3  PLT 295*   Basic Metabolic Panel: Recent Labs  Lab 09/17/20 1524  NA 137  K 4.7  CL 101  CO2 19*  GLUCOSE 96  BUN 85*  CREATININE 12.22*  CALCIUM 7.6*   GFR: Estimated Creatinine Clearance: 5.4 mL/min (A) (by C-G formula based on SCr of 12.22 mg/dL (H)). Liver Function Tests: No results for input(s): AST, ALT, ALKPHOS, BILITOT, PROT, ALBUMIN in the last 168 hours. No results for input(s): LIPASE, AMYLASE in the last 168 hours. No results for input(s): AMMONIA in the last 168 hours. Coagulation Profile: No results for input(s): INR, PROTIME in the last 168 hours. Cardiac Enzymes: No results for input(s): CKTOTAL, CKMB, CKMBINDEX, TROPONINI in the last 168 hours. BNP (last 3 results) No results for input(s): PROBNP in the last 8760  hours. HbA1C: No results for input(s): HGBA1C in the last 72 hours. CBG: No results for input(s): GLUCAP in the last 168 hours. Lipid Profile: No results for input(s): CHOL, HDL, LDLCALC, TRIG, CHOLHDL, LDLDIRECT in the last 72 hours. Thyroid Function Tests: No results for input(s): TSH, T4TOTAL, FREET4, T3FREE, THYROIDAB in the last 72 hours. Anemia Panel: No results for input(s): VITAMINB12, FOLATE, FERRITIN, TIBC, IRON, RETICCTPCT in the last 72 hours. Urine analysis:    Component Value Date/Time   COLORURINE YELLOW 04/15/2020 2200   APPEARANCEUR TURBID (A) 04/15/2020 2200   LABSPEC 1.010 04/15/2020 2200   PHURINE 6.0 04/15/2020 2200   GLUCOSEU NEGATIVE 04/15/2020 2200   HGBUR SMALL (A) 04/15/2020 2200   BILIRUBINUR NEGATIVE 04/15/2020 2200   KETONESUR NEGATIVE 04/15/2020 2200   PROTEINUR 100 (A) 04/15/2020 2200   NITRITE POSITIVE (A) 04/15/2020 2200   LEUKOCYTESUR LARGE (A) 04/15/2020 2200   Sepsis Labs: @LABRCNTIP (procalcitonin:4,lacticidven:4) ) Recent Results (from the past 240 hour(s))  Resp Panel by RT PCR (RSV,  Flu A&B, Covid) - Nasopharyngeal Swab     Status: None   Collection Time: 09/17/20 12:55 PM   Specimen: Nasopharyngeal Swab  Result Value Ref Range Status   SARS Coronavirus 2 by RT PCR NEGATIVE NEGATIVE Final    Comment: (NOTE) SARS-CoV-2 target nucleic acids are NOT DETECTED.  The SARS-CoV-2 RNA is generally detectable in upper respiratoy specimens during the acute phase of infection. The lowest concentration of SARS-CoV-2 viral copies this assay can detect is 131 copies/mL. A negative result does not preclude SARS-Cov-2 infection and should not be used as the sole basis for treatment or other patient management decisions. A negative result may occur with  improper specimen collection/handling, submission of specimen other than nasopharyngeal swab, presence of viral mutation(s) within the areas targeted by this assay, and inadequate number of viral  copies (<131 copies/mL). A negative result must be combined with clinical observations, patient history, and epidemiological information. The expected result is Negative.  Fact Sheet for Patients:  PinkCheek.be  Fact Sheet for Healthcare Providers:  GravelBags.it  This test is no t yet approved or cleared by the [place] FDA and  has been authorized for detection and/or diagnosis of SARS-CoV-2 by FDA under an Emergency Use Authorization (EUA). This EUA will remain  in effect (meaning this test can be used) for the duration of the COVID-19 declaration under Section 564(b)(1) of the Act, 21 U.S.C. section 360bbb-3(b)(1), unless the authorization is terminated or revoked sooner.     Influenza A by PCR NEGATIVE NEGATIVE Final   Influenza B by PCR NEGATIVE NEGATIVE Final    Comment: (NOTE) The Xpert Xpress SARS-CoV-2/FLU/RSV assay is intended as an aid in  the diagnosis of influenza from Nasopharyngeal swab specimens and  should not be used as a sole basis for treatment. Nasal washings and  aspirates are unacceptable for Xpert Xpress SARS-CoV-2/FLU/RSV  testing.  Fact Sheet for Patients: PinkCheek.be  Fact Sheet for Healthcare Providers: GravelBags.it  This test is not yet approved or cleared by the [place] FDA and  has been authorized for detection and/or diagnosis of SARS-CoV-2 by  FDA under an Emergency Use Authorization (EUA). This EUA will remain  in effect (meaning this test can be used) for the duration of the  Covid-19 declaration under Section 564(b)(1) of the Act, 21  U.S.C. section 360bbb-3(b)(1), unless the authorization is  terminated or revoked.    Respiratory Syncytial Virus by PCR NEGATIVE NEGATIVE Final    Comment: (NOTE) Fact Sheet for Patients: PinkCheek.be  Fact Sheet for Healthcare  Providers: GravelBags.it  This test is not yet approved or cleared by the [place] FDA and  has been authorized for detection and/or diagnosis of SARS-CoV-2 by  FDA under an Emergency Use Authorization (EUA). This EUA will remain  in effect (meaning this test can be used) for the duration of the  COVID-19 declaration under Section 564(b)(1) of the Act, 21 U.S.C.  section 360bbb-3(b)(1), unless the authorization is terminated or  revoked. Performed at [facility], [address], [place], [place] [address]      Radiological Exams on Admission: DG Chest 2 View  Result Date: 09/17/2020 CLINICAL DATA:  Golden shortness of breath EXAM: CHEST - 2 VIEW COMPARISON:  Apr 19, 2020 FINDINGS: The cardiomediastinal silhouette is unchanged in contour.RIGHT chest CVC tip terminates over the RIGHT atrium. Small LEFT pleural effusion. No pneumothorax. New LEFT basilar heterogeneous opacities. Atherosclerotic calcifications of the aorta. Visualized abdomen is unremarkable. Multilevel degenerative changes of the thoracic spine. IMPRESSION:  1. New LEFT basilar heterogeneous opacities which may reflect atelectasis, aspiration, or infection. Followup PA and lateral chest X-ray is recommended in 3-4 weeks following trial of antibiotic therapy to ensure resolution and exclude underlying malignancy. 2. Small LEFT pleural effusion. Electronically Signed   By: Valentino Saxon MD   On: 09/17/2020 15:42    EKG: Independently reviewed.  Sinus tachycardia.  No acute ST changes  Assessment/Plan: Active Problems:   Chronic obstructive pulmonary disease (HCC)   Hypertension secondary to other renal disorders   Anemia due to chronic kidney disease   ESRD (end stage renal disease) (HCC)   CAP (community acquired pneumonia)   COPD with acute exacerbation ([place])    This patient was discussed with the ED physician, including pertinent vitals, physical exam findings, labs, and imaging.  We  also discussed care given by the ED provider.  1. Community-acquired pneumonia Antibiotics: Rocephin and azithromycin Robitussin Blood cultures drawn in the emergency department Sputum cultures CBC tomorrow Strep by urine Influenza screen and Covid screen negative 2. COPD with exacerbation Antibiotics: As above DuoNeb's every 6 scheduled with albuterol every 2 when necessary Continue inhaled steroids and LA bronchodilator Solu-Medrol 60 mg IV every 6 hours Mucinex 3. End-stage renal disease on dialysis a. Consult nephrology 4. Hypertension a. Hypertensive 5. Anemia secondary to end-stage renal disease  DVT prophylaxis: Lovenox Consultants: Nephrology Code Status: DNR confirmed by patient Family Communication:   Disposition Plan: Should be able to return home following stabilization   Truett Mainland, DO

## 2020-09-17 NOTE — ED Notes (Signed)
Have paged respiratory  

## 2020-09-17 NOTE — ED Provider Notes (Signed)
[facility] EMERGENCY DEPARTMENT Provider Note   CSN: [id number] Arrival date & time: 09/17/20  1226     History Chief Complaint  Patient presents with  . Shortness of Breath    Kerry Underwood is a 69 y.o. male with a history of hypertension, CHF, renal failure on dialysis since May 2021 and COPD presenting with a 1 week history of "cold" symptoms including congestion cough and increasing weakness with wheezing and shortness of breath for the past week.  He has had no fevers or chills, also denies any abdominal pain or chest pain also no nausea vomiting or diarrhea.  He has missed his last 3 dialysis sessions secondary to feeling too bad to drive himself to the dialysis center.  He has used his albuterol inhaler, last just prior to arrival with no significant improvement in wheezing.  His cough has been wet sounding but not productive.  He was around some grandkids who had similar symptoms, they tested negative for COVID-19. Pt is not Covid vaccinated.   HPI     Past Medical History:  Diagnosis Date  . Anemia   . CHF (congestive heart failure) ([place])   . COPD (chronic obstructive pulmonary disease) ([place])   . Full dentures   . Hypertension   . Pyelonephritis 09/11/2017  . Renal disorder   . Stroke [facility])    " A few mini strokes, I didn't know it "    Patient Active Problem List   Diagnosis Date Noted  . Chronic kidney disease requiring chronic dialysis ([place]) 05/11/2020  . Anemia due to chronic kidney disease 04/15/2020  . Hyperkalemia 04/15/2020  . Hypocalcemia 04/15/2020  . Benign prostatic hyperplasia with urinary retention   . Chronic obstructive pulmonary disease ([place])   . Hypertension secondary to other renal disorders     Past Surgical History:  Procedure Laterality Date  . BASCILIC VEIN TRANSPOSITION Right 04/19/2020   Procedure: Basilic Vein Transposition Right Arm;  Surgeon: Waynetta Sandy, MD;  Location: [place];  Service: Vascular;  Laterality: Right;  .  INSERTION OF DIALYSIS CATHETER Right 04/19/2020   Procedure: Insertion Of Right Internal Jugular Dialysis Catheter;  Surgeon: Waynetta Sandy, MD;  Location: [place];  Service: Vascular;  Laterality: Right;  . MULTIPLE TOOTH EXTRACTIONS         Family History  Problem Relation Age of Onset  . Alzheimer's disease Mother     Social History   Tobacco Use  . Smoking status: Current Every Day Smoker    Packs/day: 0.50    Types: Cigarettes  . Smokeless tobacco: Former Systems developer    Types: Secondary school teacher  . Vaping Use: Never used  Substance Use Topics  . Alcohol use: No  . Drug use: Not Currently    Types: Marijuana    Home Medications Prior to Admission medications   Medication Sig Start Date End Date Taking? Authorizing Provider  albuterol (VENTOLIN HFA) 108 (90 Base) MCG/ACT inhaler Inhale 2 puffs into the lungs every 6 (six) hours as needed for wheezing or shortness of breath. 04/20/20 05/20/20  Alma Friendly, MD  amLODipine (NORVASC) 5 MG tablet Take 1 tablet (5 mg total) by mouth daily. 04/21/20 05/21/20  Alma Friendly, MD  aspirin EC 325 MG tablet Take 325 mg by mouth daily.     [provider]  calcium carbonate (OS-CAL - DOSED IN MG OF ELEMENTAL CALCIUM) 1250 (500 Ca) MG tablet Take 1 tablet (500 mg of elemental calcium total) by mouth  at bedtime. 04/20/20 05/20/20  Alma Friendly, MD  metoprolol tartrate (LOPRESSOR) 25 MG tablet Take 1 tablet (25 mg total) by mouth 2 (two) times daily. 04/20/20 05/20/20  Alma Friendly, MD  tamsulosin (FLOMAX) 0.4 MG CAPS capsule Take 0.4 mg by mouth at bedtime. 05/24/20   [provider]  tiotropium (SPIRIVA HANDIHALER) 18 MCG inhalation capsule Place 1 capsule (18 mcg total) into inhaler and inhale daily. 04/20/20 05/20/20  Alma Friendly, MD    Allergies    Strawberry (diagnostic)  Review of Systems   Review of Systems  Constitutional: Positive for fatigue. Negative for chills and fever.    HENT: Negative for congestion and sore throat.   Eyes: Negative.   Respiratory: Positive for cough, chest tightness, shortness of breath and wheezing.   Cardiovascular: Negative for chest pain, palpitations and leg swelling.  Gastrointestinal: Negative for abdominal pain, nausea and vomiting.  Genitourinary: Negative.   Musculoskeletal: Negative for arthralgias, joint swelling and neck pain.  Skin: Negative.  Negative for rash and wound.  Neurological: Positive for weakness. Negative for dizziness, light-headedness, numbness and headaches.  Psychiatric/Behavioral: Negative.     Physical Exam Updated Vital Signs BP (!) 149/72   Pulse 90   Temp 98.7 F (37.1 C) (Oral)   Resp (!) 22   Ht 5\' 5"  (1.651 m)   Wt 80.6 kg   SpO2 91%   BMI 29.55 kg/m   Physical Exam Vitals and nursing note reviewed.  Constitutional:      Appearance: He is well-developed.  HENT:     Head: Normocephalic and atraumatic.  Eyes:     Conjunctiva/sclera: Conjunctivae normal.  Cardiovascular:     Rate and Rhythm: Normal rate and regular rhythm.     Heart sounds: Normal heart sounds.  Pulmonary:     Effort: Pulmonary effort is normal.     Breath sounds: Decreased breath sounds, wheezing and rhonchi present. No rales.     Comments:   Wheezing throughout all lung fields with prolonged expirations.  Crackles at left base. Abdominal:     General: Bowel sounds are normal.     Palpations: Abdomen is soft.     Tenderness: There is no abdominal tenderness.  Musculoskeletal:        General: Normal range of motion.     Cervical back: Normal range of motion.     Right lower leg: No tenderness. No edema.     Left lower leg: No tenderness. No edema.  Skin:    General: Skin is warm and dry.  Neurological:     Mental Status: He is alert.     ED Results / Procedures / Treatments   Labs (all labs ordered are listed, but only abnormal results are displayed) Labs Reviewed  CBC WITH DIFFERENTIAL/PLATELET -  Abnormal; Notable for the following components:      Result Value   WBC 14.0 (*)    RBC 3.41 (*)    Hemoglobin 10.5 (*)    HCT 32.5 (*)    Platelets 410 (*)    Neutro Abs 12.0 (*)    Abs Immature Granulocytes 0.20 (*)    All other components within normal limits  BASIC METABOLIC PANEL - Abnormal; Notable for the following components:   CO2 19 (*)    BUN 85 (*)    Creatinine, Ser 12.22 (*)    Calcium 7.6 (*)    GFR, Estimated 4 (*)    Anion gap 17 (*)    All other  components within normal limits  TROPONIN I (HIGH SENSITIVITY) - Abnormal; Notable for the following components:   Troponin I (High Sensitivity) 25 (*)    All other components within normal limits  RESP PANEL BY RT PCR (RSV, FLU A&B, COVID)  TROPONIN I (HIGH SENSITIVITY)    EKG None  Radiology DG Chest 2 View  Result Date: 09/17/2020 CLINICAL DATA:  Golden shortness of breath EXAM: CHEST - 2 VIEW COMPARISON:  Apr 19, 2020 FINDINGS: The cardiomediastinal silhouette is unchanged in contour.RIGHT chest CVC tip terminates over the RIGHT atrium. Small LEFT pleural effusion. No pneumothorax. New LEFT basilar heterogeneous opacities. Atherosclerotic calcifications of the aorta. Visualized abdomen is unremarkable. Multilevel degenerative changes of the thoracic spine. IMPRESSION: 1. New LEFT basilar heterogeneous opacities which may reflect atelectasis, aspiration, or infection. Followup PA and lateral chest X-ray is recommended in 3-4 weeks following trial of antibiotic therapy to ensure resolution and exclude underlying malignancy. 2. Small LEFT pleural effusion. Electronically Signed   By: Valentino Saxon MD   On: 09/17/2020 15:42    Procedures Procedures (including critical care time)  Medications Ordered in ED Medications  cefTRIAXone (ROCEPHIN) 1 g in sodium chloride 0.9 % 100 mL IVPB (1 g Intravenous New Bag/Given 09/17/20 1721)  azithromycin (ZITHROMAX) 500 mg in sodium chloride 0.9 % 250 mL IVPB (has no  administration in time range)  albuterol (PROVENTIL,VENTOLIN) solution continuous neb (has no administration in time range)  ipratropium-albuterol (DUONEB) 0.5-2.5 (3) MG/3ML nebulizer solution 3 mL (3 mLs Nebulization Given 09/17/20 1529)  albuterol (PROVENTIL) (2.5 MG/3ML) 0.083% nebulizer solution 2.5 mg (2.5 mg Nebulization Given 09/17/20 1529)  methylPREDNISolone sodium succinate (SOLU-MEDROL) 125 mg/2 mL injection 125 mg (125 mg Intravenous Given 09/17/20 1618)    ED Course  I have reviewed the triage vital signs and the nursing notes.  Pertinent labs & imaging results that were available during my care of the patient were reviewed by me and considered in my medical decision making (see chart for details).    MDM Rules/Calculators/A&P                          Patient with community acquired pneumonia, end-stage renal disease having missed 3 dialysis treatments, his potassium is stable, decreased CO2 with an anion gap of 17, leukocytosis with a white count of 14.  Clinically he is not fluid overloaded.  His initial oxygen sat at presentation was 91%.  He has anemia of 10.5 which is a chronic finding.  He denies chest pain, initial troponin is bumped at 25, pending delta troponin, this may be his baseline given his renal disease.  Patient will need admission for CAP.  His Curb 65 score puts him at moderate risk.  Rocephin and Zithromax were ordered.  He was also given IV Solu-Medrol and an albuterol Atrovent neb, he had moderate improvement in the wheezing and he was moving air better but still had moderate expiratory wheeze at the left lung.  An hour-long neb treatment was ordered.  Plan to admit pt once 2nd trop results.  6:33 PM second troponin stable.    Discussed with Dr. Nehemiah Settle who accepts pt for admission.   Final Clinical Impression(s) / ED Diagnoses Final diagnoses:  Community acquired pneumonia of left lower lobe of lung  Hypoxia    Rx / DC Orders ED Discharge Orders     None       Landis Martins 09/17/20 1841    Davonna Belling, MD  09/17/20 2341  

## 2020-09-17 NOTE — ED Triage Notes (Signed)
Pt reports having a "cold" with SOB and congestion x 1 week, denies fever, n/v/d, O2 sats in triage 91%, pt reports he is a dialysis pt 3 times a week (T, Th, Sa), today is the 3rd day he has missed dialysis

## 2020-09-18 LAB — RESPIRATORY PANEL BY PCR

## 2020-09-18 NOTE — ED Notes (Signed)
Pt stating he wants to leave. Pt demands for me to bring him his paper work. This RN explained to pt that he would not receive paper work because he would be leaving AMA. Pt states "you wait and see I'll get my damn paper work." Admitting MD paged and asked to come to see pt.

## 2020-09-18 NOTE — ED Notes (Signed)
Pt leaving AMA. Pt refused to sign AMA form

## 2020-09-19 ENCOUNTER — Other Ambulatory Visit (HOSPITAL_COMMUNITY): Payer: Medicare Other

## 2020-09-20 ENCOUNTER — Encounter (HOSPITAL_COMMUNITY): Payer: Self-pay | Admitting: Vascular Surgery

## 2020-09-20 ENCOUNTER — Other Ambulatory Visit: Payer: Self-pay

## 2020-09-20 NOTE — Progress Notes (Signed)
Called pt for pre-op call. When I told him what I was calling for, he says he can't have surgery because he has pneumonia. He did state that he did go to dialysis today. I notified Erick Blinks, RN at Dr. Claretha Cooper office. She states she will call pt and verify that he is not having surgery.

## 2020-09-20 NOTE — Progress Notes (Signed)
Anesthesia Chart Review: Kerry Underwood   Case: [id number] Date/Time: 09/21/20 0815   Procedure: RIGHT SECOND STAGE BASCILIC VEIN TRANSPOSITION WITH POSSIBLE BANDING (Right )   Anesthesia type: Monitor Anesthesia Care   Pre-op diagnosis: END STAGE RENAL DISEASE   Location: [place] OR ROOM 11 / [place] OR   Surgeons: Waynetta Sandy, MD       DISCUSSION: Patient is a 69 year old male scheduled for the above procedure.  S/p right IJ tunneled dialysis catheter and first stage RUE basilic vein AVF 03/10/01.    History includes smoking, COPD, HTN, diastolic CHF (admission 06/2535 in the setting of hypertensive emergency, COPD exacerbation, CKD, and BPH with urinary retention), TIA, ESRD, anemia.   Patient presented to [facility] ED on 09/17/20 with 6 days of dyspnea and cough. He reported missing hemodialysis for 3 sessions due to illness. + Wheezing. His grand kids had similar symptoms first and had tested negative for COVID-19. WBC 14K, O2 sat at some point was low 90's (documented as 100% with vitals). CXR showed new left basilar opacity (atelectasis versus aspiration  versus infection/PNA) with small left pleural effusion--antibiotic treatment followed by follow-up xray in 3-4 weeks recommended. His respiratory panel was + rhinovirus/enterovirus and negative for COVID-19. Urine culture ultimately grew out > 100K colonies E. Coli. Admission was recommended for community-acquired pneumonia, but patient signed out AMA.   Staff contacted patient to follow-up on symptoms. He is back in dialysis (last 09/20/20), but still not recovered for respiratory illness. Plan to delay surgery.     VS:  BP Readings from Last 3 Encounters:  09/18/20 (!) 160/74  07/13/20 (!) 174/84  05/11/20 124/63   Pulse Readings from Last 3 Encounters:  09/18/20 97  07/13/20 77  05/11/20 81    PROVIDERS: [name], FNP is PCP Seen by nephrologist Donato Heinz, MD during May admission. Patient has also been  referred to [facility] for Renal Transplant Evaluation.    LABS: Most recent lab results include: Lab Results  Component Value Date   WBC 14.0 (H) 09/17/2020   HGB 10.5 (L) 09/17/2020   HCT 32.5 (L) 09/17/2020   PLT 410 (H) 09/17/2020   GLUCOSE 96 09/17/2020   ALT 15 04/16/2020   AST 22 04/16/2020   NA 137 09/17/2020   K 4.7 09/17/2020   CL 101 09/17/2020   CREATININE 12.22 (H) 09/17/2020   BUN 85 (H) 09/17/2020   CO2 19 (L) 09/17/2020     IMAGES: CXR 09/17/20: IMPRESSION: 1. New LEFT basilar heterogeneous opacities which may reflect atelectasis, aspiration, or infection. Followup PA and lateral chest X-ray is recommended in 3-4 weeks following trial of antibiotic therapy to ensure resolution and exclude underlying malignancy. 2. Small LEFT pleural effusion.   EKG: 09/17/20:  Sinus tachycardia at 100 bpm Repol abnrm suggests ischemia, lateral leads Baseline wander in lead(s) V6 Confirmed by Davonna Belling [id number]) on 09/17/2020 11:41:36 PM - No comparison EKG in [facility] or [facility]. Negative T waves in aVL, but more difficult to accurately in other lateral leads due to baseline wanderer. By result narrative, a 05/24/17 EKG at [facility] showed SR, moderate voltage criteria for LVH with non-specific T wave changes.    CV: Echo 05/15/17 [facility]): SUMMARY  Normal LV systolic function. LVEF 55-60%. Mild LVH.  Diastolic dysfunction.  The RV is normal in size and function. No significant valvular disease. (Trace MR, mild TR) No prior.    Past Medical History:  Diagnosis Date   Anemia  CHF (congestive heart failure) (HCC)    COPD (chronic obstructive pulmonary disease) ([place])    Full dentures    Hypertension    Pyelonephritis 09/11/2017   Renal disorder    Stroke [facility])    " A few mini strokes, I didn't know it "    Past Surgical History:  Procedure Laterality Date   [place] Right 04/19/2020   Procedure: Basilic Vein Transposition Right  Arm;  Surgeon: Waynetta Sandy, MD;  Location: [place];  Service: Vascular;  Laterality: Right;   INSERTION OF DIALYSIS CATHETER Right 04/19/2020   Procedure: Insertion Of Right Internal Jugular Dialysis Catheter;  Surgeon: Waynetta Sandy, MD;  Location: [place];  Service: Vascular;  Laterality: Right;   MULTIPLE TOOTH EXTRACTIONS      MEDICATIONS: No current facility-administered medications for this encounter.    albuterol (VENTOLIN HFA) 108 (90 Base) MCG/ACT inhaler   amLODipine (NORVASC) 5 MG tablet   aspirin EC 325 MG tablet   calcium carbonate (OS-CAL - DOSED IN MG OF ELEMENTAL CALCIUM) 1250 (500 Ca) MG tablet   metoprolol tartrate (LOPRESSOR) 25 MG tablet   tamsulosin (FLOMAX) 0.4 MG CAPS capsule   tiotropium (SPIRIVA HANDIHALER) 18 MCG inhalation capsule    Myra Gianotti, PA-C Surgical Short Stay/Anesthesiology [facility] Phone [phone] [facility] Phone [phone] 09/20/2020 1:42 PM

## 2020-09-21 ENCOUNTER — Encounter (HOSPITAL_COMMUNITY): Admission: RE | Payer: Self-pay | Source: Home / Self Care

## 2020-09-21 ENCOUNTER — Ambulatory Visit (HOSPITAL_COMMUNITY): Admission: RE | Admit: 2020-09-21 | Payer: Medicare Other | Source: Home / Self Care | Admitting: Vascular Surgery

## 2020-09-21 SURGERY — TRANSPOSITION, VEIN, BASILIC
Anesthesia: Monitor Anesthesia Care | Laterality: Right

## 2020-11-17 DIAGNOSIS — U071 COVID-19: Secondary | ICD-10-CM | POA: Insufficient documentation

## 2020-11-22 ENCOUNTER — Encounter (HOSPITAL_COMMUNITY): Payer: Self-pay | Admitting: *Deleted

## 2020-11-22 ENCOUNTER — Other Ambulatory Visit: Payer: Self-pay

## 2020-11-22 ENCOUNTER — Inpatient Hospital Stay (HOSPITAL_COMMUNITY)
Admission: EM | Admit: 2020-11-22 | Discharge: 2020-11-24 | DRG: 193 | Disposition: A | Discharge status: Home / Self Care | Payer: Medicare Other | Attending: Family Medicine | Admitting: Family Medicine

## 2020-11-22 ENCOUNTER — Emergency Department (HOSPITAL_COMMUNITY): Payer: Medicare Other

## 2020-11-22 DIAGNOSIS — J101 Influenza due to other identified influenza virus with other respiratory manifestations: Principal | ICD-10-CM | POA: Diagnosis present

## 2020-11-22 DIAGNOSIS — E8889 Other specified metabolic disorders: Secondary | ICD-10-CM | POA: Diagnosis present

## 2020-11-22 DIAGNOSIS — Z7982 Long term (current) use of aspirin: Secondary | ICD-10-CM

## 2020-11-22 DIAGNOSIS — Z8679 Personal history of other diseases of the circulatory system: Secondary | ICD-10-CM

## 2020-11-22 DIAGNOSIS — Z9115 Patient's noncompliance with renal dialysis: Secondary | ICD-10-CM

## 2020-11-22 DIAGNOSIS — N401 Enlarged prostate with lower urinary tract symptoms: Secondary | ICD-10-CM | POA: Diagnosis present

## 2020-11-22 DIAGNOSIS — Z20822 Contact with and (suspected) exposure to covid-19: Secondary | ICD-10-CM | POA: Diagnosis present

## 2020-11-22 DIAGNOSIS — N186 End stage renal disease: Secondary | ICD-10-CM | POA: Diagnosis present

## 2020-11-22 DIAGNOSIS — Z8701 Personal history of pneumonia (recurrent): Secondary | ICD-10-CM

## 2020-11-22 DIAGNOSIS — J441 Chronic obstructive pulmonary disease with (acute) exacerbation: Secondary | ICD-10-CM | POA: Diagnosis not present

## 2020-11-22 DIAGNOSIS — Z820 Family history of epilepsy and other diseases of the nervous system: Secondary | ICD-10-CM

## 2020-11-22 DIAGNOSIS — I151 Hypertension secondary to other renal disorders: Secondary | ICD-10-CM

## 2020-11-22 DIAGNOSIS — D631 Anemia in chronic kidney disease: Secondary | ICD-10-CM | POA: Diagnosis present

## 2020-11-22 DIAGNOSIS — I4581 Long QT syndrome: Secondary | ICD-10-CM | POA: Diagnosis present

## 2020-11-22 DIAGNOSIS — Z79899 Other long term (current) drug therapy: Secondary | ICD-10-CM

## 2020-11-22 DIAGNOSIS — N2581 Secondary hyperparathyroidism of renal origin: Secondary | ICD-10-CM | POA: Diagnosis present

## 2020-11-22 DIAGNOSIS — R338 Other retention of urine: Secondary | ICD-10-CM | POA: Diagnosis present

## 2020-11-22 DIAGNOSIS — J111 Influenza due to unidentified influenza virus with other respiratory manifestations: Secondary | ICD-10-CM

## 2020-11-22 DIAGNOSIS — Z95828 Presence of other vascular implants and grafts: Secondary | ICD-10-CM

## 2020-11-22 DIAGNOSIS — N2889 Other specified disorders of kidney and ureter: Secondary | ICD-10-CM

## 2020-11-22 DIAGNOSIS — F17210 Nicotine dependence, cigarettes, uncomplicated: Secondary | ICD-10-CM | POA: Diagnosis present

## 2020-11-22 DIAGNOSIS — J9601 Acute respiratory failure with hypoxia: Secondary | ICD-10-CM | POA: Diagnosis present

## 2020-11-22 DIAGNOSIS — Z992 Dependence on renal dialysis: Secondary | ICD-10-CM

## 2020-11-22 DIAGNOSIS — J449 Chronic obstructive pulmonary disease, unspecified: Secondary | ICD-10-CM | POA: Diagnosis present

## 2020-11-22 DIAGNOSIS — Z8673 Personal history of transient ischemic attack (TIA), and cerebral infarction without residual deficits: Secondary | ICD-10-CM

## 2020-11-22 LAB — TROPONIN I (HIGH SENSITIVITY)
Troponin I (High Sensitivity): 21 ng/L — ABNORMAL HIGH (ref <18)
Troponin I (High Sensitivity): 19 ng/L — ABNORMAL HIGH (ref <18)

## 2020-11-22 LAB — CBC WITH DIFFERENTIAL/PLATELET
Abs Immature Granulocytes: 0.04 10*3/uL (ref 0.00–0.07)
Basophils Absolute: 0 10*3/uL (ref 0.0–0.1)
Basophils Relative: 0 %
Eosinophils Absolute: 0 10*3/uL (ref 0.0–0.5)
Eosinophils Relative: 0 %
HCT: 36.3 % — ABNORMAL LOW (ref 39.0–52.0)
Hemoglobin: 11.7 g/dL — ABNORMAL LOW (ref 13.0–17.0)
Immature Granulocytes: 1 %
Lymphocytes Relative: 14 %
Lymphs Abs: 0.6 10*3/uL — ABNORMAL LOW (ref 0.7–4.0)
MCH: 31.1 pg (ref 26.0–34.0)
MCHC: 32.2 g/dL (ref 30.0–36.0)
MCV: 96.5 fL (ref 80.0–100.0)
Monocytes Absolute: 0.4 10*3/uL (ref 0.1–1.0)
Monocytes Relative: 9 %
Neutro Abs: 3.1 10*3/uL (ref 1.7–7.7)
Neutrophils Relative %: 76 %
Platelets: 156 10*3/uL (ref 150–400)
RBC: 3.76 MIL/uL — ABNORMAL LOW (ref 4.22–5.81)
RDW: 15.7 % — ABNORMAL HIGH (ref 11.5–15.5)
WBC: 4.1 10*3/uL (ref 4.0–10.5)
nRBC: 0 % (ref 0.0–0.2)

## 2020-11-22 LAB — COMPREHENSIVE METABOLIC PANEL
ALT: 7 U/L (ref 0–44)
AST: 17 U/L (ref 15–41)
Albumin: 3.4 g/dL — ABNORMAL LOW (ref 3.5–5.0)
Alkaline Phosphatase: 39 U/L (ref 38–126)
Anion gap: 14 (ref 5–15)
BUN: 75 mg/dL — ABNORMAL HIGH (ref 8–23)
CO2: 19 mmol/L — ABNORMAL LOW (ref 22–32)
Calcium: 7.8 mg/dL — ABNORMAL LOW (ref 8.9–10.3)
Chloride: 101 mmol/L (ref 98–111)
Creatinine, Ser: 11.15 mg/dL — ABNORMAL HIGH (ref 0.61–1.24)
GFR, Estimated: 4 mL/min — ABNORMAL LOW (ref >60)
Glucose, Bld: 93 mg/dL (ref 70–99)
Potassium: 4.8 mmol/L (ref 3.5–5.1)
Sodium: 134 mmol/L — ABNORMAL LOW (ref 135–145)
Total Bilirubin: 0.4 mg/dL (ref 0.3–1.2)
Total Protein: 7.2 g/dL (ref 6.5–8.1)

## 2020-11-22 LAB — RESP PANEL BY RT-PCR (FLU A&B, COVID) ARPGX2
Influenza A by PCR: POSITIVE — AB
Influenza B by PCR: NEGATIVE
SARS Coronavirus 2 by RT PCR: NEGATIVE

## 2020-11-22 MED ORDER — ASPIRIN EC 325 MG PO TBEC
325.0000 mg | DELAYED_RELEASE_TABLET | Freq: Every day | ORAL | Status: DC
  Administered 2020-11-23: 10:00:00 325 mg via ORAL
  Filled 2020-11-22: qty 1

## 2020-11-22 MED ORDER — HEPARIN SODIUM (PORCINE) 5000 UNIT/ML IJ SOLN
5000.0000 [IU] | Freq: Three times a day (TID) | INTRAMUSCULAR | Status: DC
  Administered 2020-11-23 – 2020-11-24 (×6): 5000 [IU] via SUBCUTANEOUS
  Filled 2020-11-22 (×6): qty 1

## 2020-11-22 MED ORDER — IPRATROPIUM-ALBUTEROL 0.5-2.5 (3) MG/3ML IN SOLN: 3.0000 mL | RESPIRATORY_TRACT | Status: DC | PRN

## 2020-11-22 MED ORDER — IPRATROPIUM-ALBUTEROL 0.5-2.5 (3) MG/3ML IN SOLN
3.0000 mL | Freq: Four times a day (QID) | RESPIRATORY_TRACT | Status: AC
  Administered 2020-11-22 – 2020-11-23 (×2): 3 mL via RESPIRATORY_TRACT
  Filled 2020-11-22 (×2): qty 3

## 2020-11-22 MED ORDER — OSELTAMIVIR PHOSPHATE 30 MG PO CAPS: 30.0000 mg | ORAL_CAPSULE | Freq: Every day | ORAL | Status: DC

## 2020-11-22 MED ORDER — METHYLPREDNISOLONE SODIUM SUCC 125 MG IJ SOLR
125.0000 mg | Freq: Once | INTRAMUSCULAR | Status: AC
  Administered 2020-11-22: 12:00:00 125 mg via INTRAVENOUS
  Filled 2020-11-22: qty 2

## 2020-11-22 MED ORDER — SUCROFERRIC OXYHYDROXIDE 500 MG PO CHEW
1000.0000 mg | CHEWABLE_TABLET | Freq: Three times a day (TID) | ORAL | Status: DC
  Administered 2020-11-23 – 2020-11-24 (×4): 1000 mg via ORAL
  Filled 2020-11-22 (×9): qty 2

## 2020-11-22 MED ORDER — METOPROLOL TARTRATE 25 MG PO TABS
25.0000 mg | ORAL_TABLET | Freq: Two times a day (BID) | ORAL | Status: DC
  Administered 2020-11-23 (×3): 25 mg via ORAL
  Filled 2020-11-22 (×3): qty 1

## 2020-11-22 MED ORDER — ONDANSETRON HCL 4 MG/2ML IJ SOLN: 4.0000 mg | Freq: Four times a day (QID) | INTRAMUSCULAR | Status: DC | PRN

## 2020-11-22 MED ORDER — ONDANSETRON HCL 4 MG PO TABS: 4.0000 mg | ORAL_TABLET | Freq: Four times a day (QID) | ORAL | Status: DC | PRN

## 2020-11-22 MED ORDER — SODIUM CHLORIDE 0.9 % IV SOLN
100.0000 mg | Freq: Two times a day (BID) | INTRAVENOUS | Status: DC
  Administered 2020-11-22 – 2020-11-24 (×4): 100 mg via INTRAVENOUS
  Filled 2020-11-22 (×7): qty 100

## 2020-11-22 MED ORDER — GUAIFENESIN-DM 100-10 MG/5ML PO SYRP
10.0000 mL | ORAL_SOLUTION | Freq: Three times a day (TID) | ORAL | Status: AC
  Administered 2020-11-23 – 2020-11-24 (×5): 10 mL via ORAL
  Filled 2020-11-22 (×5): qty 10

## 2020-11-22 MED ORDER — ACETAMINOPHEN 650 MG RE SUPP: 650.0000 mg | Freq: Four times a day (QID) | RECTAL | Status: DC | PRN

## 2020-11-22 MED ORDER — ACETAMINOPHEN 325 MG PO TABS: 650.0000 mg | ORAL_TABLET | Freq: Four times a day (QID) | ORAL | Status: DC | PRN

## 2020-11-22 MED ORDER — METHYLPREDNISOLONE SODIUM SUCC 125 MG IJ SOLR
60.0000 mg | Freq: Two times a day (BID) | INTRAMUSCULAR | Status: DC
  Administered 2020-11-23: 09:00:00 60 mg via INTRAVENOUS
  Filled 2020-11-22: qty 2

## 2020-11-22 MED ORDER — OSELTAMIVIR PHOSPHATE 30 MG PO CAPS
30.0000 mg | ORAL_CAPSULE | Freq: Once | ORAL | Status: AC
  Administered 2020-11-22: 22:00:00 30 mg via ORAL
  Filled 2020-11-22: qty 1

## 2020-11-22 MED ORDER — AMLODIPINE BESYLATE 5 MG PO TABS
5.0000 mg | ORAL_TABLET | Freq: Every day | ORAL | Status: DC
  Administered 2020-11-23: 10:00:00 5 mg via ORAL
  Filled 2020-11-22: qty 1

## 2020-11-22 MED ORDER — POLYETHYLENE GLYCOL 3350 17 G PO PACK: 17.0000 g | PACK | Freq: Every day | ORAL | Status: DC | PRN

## 2020-11-22 MED ORDER — IPRATROPIUM-ALBUTEROL 0.5-2.5 (3) MG/3ML IN SOLN
3.0000 mL | Freq: Once | RESPIRATORY_TRACT | Status: AC
  Administered 2020-11-22: 14:00:00 3 mL via RESPIRATORY_TRACT
  Filled 2020-11-22: qty 3

## 2020-11-22 MED ORDER — MAGNESIUM SULFATE 2 GM/50ML IV SOLN
2.0000 g | Freq: Once | INTRAVENOUS | Status: AC
  Administered 2020-11-22: 14:00:00 2 g via INTRAVENOUS
  Filled 2020-11-22: qty 50

## 2020-11-22 MED ORDER — ALBUTEROL SULFATE HFA 108 (90 BASE) MCG/ACT IN AERS
5.0000 | INHALATION_SPRAY | Freq: Once | RESPIRATORY_TRACT | Status: AC
  Administered 2020-11-22: 12:00:00 5 via RESPIRATORY_TRACT
  Filled 2020-11-22: qty 6.7

## 2020-11-22 MED ORDER — TAMSULOSIN HCL 0.4 MG PO CAPS
0.4000 mg | ORAL_CAPSULE | Freq: Every day | ORAL | Status: DC
  Administered 2020-11-23 (×2): 0.4 mg via ORAL
  Filled 2020-11-22 (×3): qty 1

## 2020-11-22 MED ORDER — CALCIUM CARBONATE 1250 (500 CA) MG PO TABS
1.0000 | ORAL_TABLET | Freq: Every day | ORAL | Status: DC
  Administered 2020-11-24: 01:00:00 500 mg via ORAL
  Filled 2020-11-22 (×4): qty 1

## 2020-11-22 MED ORDER — OSELTAMIVIR PHOSPHATE 30 MG PO CAPS
30.0000 mg | ORAL_CAPSULE | ORAL | Status: DC
  Administered 2020-11-24: 17:00:00 30 mg via ORAL
  Filled 2020-11-22: qty 1

## 2020-11-22 NOTE — ED Notes (Signed)
Pt up to bathroom, steady gait observed, ambulated with [facility] portable oxygen. Returned to bed, RT at bedside.VSS.  Pt denies further needs at this time

## 2020-11-22 NOTE — ED Provider Notes (Signed)
[facility] EMERGENCY DEPARTMENT Provider Note   CSN: [id number] Arrival date & time: 11/22/20  7829     History Chief Complaint  Patient presents with  . Shortness of Breath    Kerry Underwood is a 69 y.o. male with past medical history significant for CHF, COPD, CVA, ESRD Monday Wednesday Friday dialysis who presents for evaluation of cough and shortness of breath.  Seen on 09/17/2020.  Had [facility] pneumonia at that time.  Was admitted however subsequently left AMA without further antibiotics.  Patient states he thought he had gotten better.  Over the last 3 weeks he has noted progressively worsening cough, shortness of breath and productive sputum of light green and yellow.  Was at dialysis on Friday was noted to have a fever up to 102 per patient.  He states he got a Covid test however does not know the results.  He is not vaccinated against Covid.  Feels like his shortness of breath has significantly worsened over the last week.  States she does not feel like his weight is up.  Has not noted any additional lower extremity swelling.  He was unable to go to dialysis today due to his shortness of breath.  Does have some mild chest pain when he coughs.  Does have inhalers at home however does not use these frequently.  He has not been on steroids recently.  Has had some chills.  No headache, lightheadedness, dizziness, nausea, vomiting, hemoptysis unilateral leg sling, redness warmth, domino pain or diarrhea.  Denies additional aggravating or alleviating factors.  History obtained from patient and past medical records.  No interpreter used.  HPI     Past Medical History:  Diagnosis Date  . Anemia   . CHF (congestive heart failure) ([place])   . COPD (chronic obstructive pulmonary disease) ([place])   . Full dentures   . Hypertension   . Pyelonephritis 09/11/2017  . Renal disorder   . Stroke [facility])    " A few mini strokes, I didn't know it "    Patient Active Problem List   Diagnosis Date  Noted  . CAP (community acquired pneumonia) 09/17/2020  . COPD with acute exacerbation ([place]) 09/17/2020  . ESRD (end stage renal disease) ([place]) 05/11/2020  . Anemia due to chronic kidney disease 04/15/2020  . Hyperkalemia 04/15/2020  . Hypocalcemia 04/15/2020  . Benign prostatic hyperplasia with urinary retention   . Chronic obstructive pulmonary disease ([place])   . Hypertension secondary to other renal disorders     Past Surgical History:  Procedure Laterality Date  . BASCILIC VEIN TRANSPOSITION Right 04/19/2020   Procedure: Basilic Vein Transposition Right Arm;  Surgeon: Waynetta Sandy, MD;  Location: [place];  Service: Vascular;  Laterality: Right;  . INSERTION OF DIALYSIS CATHETER Right 04/19/2020   Procedure: Insertion Of Right Internal Jugular Dialysis Catheter;  Surgeon: Waynetta Sandy, MD;  Location: [place];  Service: Vascular;  Laterality: Right;  . MULTIPLE TOOTH EXTRACTIONS         Family History  Problem Relation Age of Onset  . Alzheimer's disease Mother     Social History   Tobacco Use  . Smoking status: Current Every Day Smoker    Packs/day: 0.50    Types: Cigarettes  . Smokeless tobacco: Former Systems developer    Types: Secondary school teacher  . Vaping Use: Never used  Substance Use Topics  . Alcohol use: No  . Drug use: Not Currently    Types: Marijuana    Home  Medications Prior to Admission medications   Medication Sig Start Date End Date Taking? Authorizing Provider  albuterol (VENTOLIN HFA) 108 (90 Base) MCG/ACT inhaler Inhale 2 puffs into the lungs every 6 (six) hours as needed for wheezing or shortness of breath. 04/20/20 09/17/20 Yes Alma Friendly, MD  amLODipine (NORVASC) 5 MG tablet Take 1 tablet (5 mg total) by mouth daily. 04/21/20 09/17/20 Yes Alma Friendly, MD  aspirin EC 325 MG tablet Take 325 mg by mouth daily.   Yes [provider]  metoprolol tartrate (LOPRESSOR) 25 MG tablet Take 1 tablet (25 mg total) by mouth 2  (two) times daily. 04/20/20 09/17/20 Yes Alma Friendly, MD  tamsulosin (FLOMAX) 0.4 MG CAPS capsule Take 0.4 mg by mouth at bedtime. 05/24/20  Yes [provider]  VELPHORO 500 MG chewable tablet Chew 1,000 mg by mouth 3 (three) times daily. 11/08/20  Yes [provider]  calcium carbonate (OS-CAL - DOSED IN MG OF ELEMENTAL CALCIUM) 1250 (500 Ca) MG tablet Take 1 tablet (500 mg of elemental calcium total) by mouth at bedtime. 04/20/20 09/17/20  Alma Friendly, MD  tiotropium (SPIRIVA HANDIHALER) 18 MCG inhalation capsule Place 1 capsule (18 mcg total) into inhaler and inhale daily. 04/20/20 09/17/20  Alma Friendly, MD    Allergies    Strawberry (diagnostic)  Review of Systems   Review of Systems  Constitutional: Positive for activity change, appetite change, fatigue and fever.  HENT: Negative.   Respiratory: Positive for cough, shortness of breath and wheezing. Negative for apnea, choking, chest tightness and stridor.   Cardiovascular: Positive for chest pain (With coughing). Negative for palpitations and leg swelling.  Gastrointestinal: Negative.   Genitourinary: Negative.   Musculoskeletal: Negative.   Neurological: Positive for weakness (Generalized). Negative for dizziness, tremors, seizures, syncope, facial asymmetry, speech difficulty, light-headedness, numbness and headaches.  All other systems reviewed and are negative.   Physical Exam Updated Vital Signs BP (!) 116/54   Pulse 78   Temp 98.1 F (36.7 C) (Oral)   Resp 16   Ht 5\' 5"  (1.651 m)   Wt 72.6 kg   SpO2 100%   BMI 26.63 kg/m   Physical Exam Vitals and nursing note reviewed.  Constitutional:      Appearance: He is ill-appearing. He is not toxic-appearing or diaphoretic.  HENT:     Head: Normocephalic and atraumatic.     Jaw: There is normal jaw occlusion.     Right Ear: Tympanic membrane, ear canal and external ear normal. There is no impacted cerumen. No hemotympanum. Tympanic  membrane is not injected, scarred, perforated, erythematous, retracted or bulging.     Left Ear: Tympanic membrane, ear canal and external ear normal. There is no impacted cerumen. No hemotympanum. Tympanic membrane is not injected, scarred, perforated, erythematous, retracted or bulging.     Ears:     Comments: No Mastoid tenderness.    Nose:     Comments: Clear rhinorrhea and congestion to bilateral nares.  No sinus tenderness.    Mouth/Throat:     Comments: Posterior oropharynx clear.  Mucous membranes moist.  Tonsils without erythema or exudate.  Uvula midline without deviation.  No evidence of PTA or RPA.  No drooling, dysphasia or trismus.  Phonation normal. Neck:     Trachea: Trachea and phonation normal.     Comments: No Neck stiffness or neck rigidity.  Cardiovascular:     Pulses: Normal pulses.          Radial  pulses are 2+ on the right side and 2+ on the left side.       Dorsalis pedis pulses are 2+ on the right side and 2+ on the left side.     Heart sounds: Normal heart sounds.     Comments: No murmurs rubs or gallops. Pulmonary:     Effort: Tachypnea and accessory muscle usage present.     Breath sounds: Decreased breath sounds and wheezing present.     Comments: Speaks in short sentences.  Wet cough.  There is tachypnea and substernal retractions.  Diffuse inspiratory and expiratory wheeze. Chest:     Comments: Equal rise and fall to chest wall.  Dialysis catheter to right upper chest wall without bleeding, drainage, surrounding erythema, warmth. Abdominal:     General: Bowel sounds are normal.     Tenderness: There is no abdominal tenderness.     Comments: Soft, nontender without rebound or guarding.  No CVA tenderness.  Musculoskeletal:     Cervical back: Full passive range of motion without pain and normal range of motion.     Comments: Moves all 4 extremities without difficulty.  Lower extremities without edema, erythema or warmth.  Bevelyn Buckles' sign negative.  Feet:      Comments: No lower extremity edema Skin:    Capillary Refill: Capillary refill takes 2 to 3 seconds.     Comments: Brisk capillary refill.  No rashes or lesions.  Neurological:     Mental Status: He is alert.     Comments: Ambulatory in department without difficulty.  Cranial nerves II through XII grossly intact.  No facial droop.  No aphasia.     ED Results / Procedures / Treatments   Labs (all labs ordered are listed, but only abnormal results are displayed) Labs Reviewed  RESP PANEL BY RT-PCR (FLU A&B, COVID) ARPGX2 - Abnormal; Notable for the following components:      Result Value   Influenza A by PCR POSITIVE (*)    All other components within normal limits  CBC WITH DIFFERENTIAL/PLATELET - Abnormal; Notable for the following components:   RBC 3.76 (*)    Hemoglobin 11.7 (*)    HCT 36.3 (*)    RDW 15.7 (*)    Lymphs Abs 0.6 (*)    All other components within normal limits  COMPREHENSIVE METABOLIC PANEL - Abnormal; Notable for the following components:   Sodium 134 (*)    CO2 19 (*)    BUN 75 (*)    Creatinine, Ser 11.15 (*)    Calcium 7.8 (*)    Albumin 3.4 (*)    GFR, Estimated 4 (*)    All other components within normal limits  TROPONIN I (HIGH SENSITIVITY) - Abnormal; Notable for the following components:   Troponin I (High Sensitivity) 21 (*)    All other components within normal limits  TROPONIN I (HIGH SENSITIVITY) - Abnormal; Notable for the following components:   Troponin I (High Sensitivity) 19 (*)    All other components within normal limits    EKG None  Radiology DG Chest Portable 1 View  Result Date: 11/22/2020 CLINICAL DATA:  Shortness of breath and fever. EXAM: PORTABLE CHEST 1 VIEW COMPARISON:  09/17/2020 FINDINGS: The heart size is normal. Stable appearance of a tunneled right jugular dialysis catheter terminating at the SVC/RA junction. Lungs demonstrate a component of chronic disease as well as some increased bronchial and interstitial  prominence in both lungs. A component of active bronchitis or interstitial pneumonia cannot be  excluded. No overt pulmonary edema, pneumothorax or pleural fluid identified. IMPRESSION: Increased bronchial and interstitial prominence in both lungs. A component of active bronchitis or interstitial pneumonia cannot be excluded. Electronically Signed   By: Aletta Edouard M.D.   On: 11/22/2020 10:59    Procedures Procedures (including critical care time)  Medications Ordered in ED Medications  doxycycline (VIBRAMYCIN) 100 mg in sodium chloride 0.9 % 250 mL IVPB (has no administration in time range)  methylPREDNISolone sodium succinate (SOLU-MEDROL) 125 mg/2 mL injection 125 mg (125 mg Intravenous Given 11/22/20 1210)  albuterol (VENTOLIN HFA) 108 (90 Base) MCG/ACT inhaler 5 puff (5 puffs Inhalation Given 11/22/20 1211)  ipratropium-albuterol (DUONEB) 0.5-2.5 (3) MG/3ML nebulizer solution 3 mL (3 mLs Nebulization Given 11/22/20 1354)  magnesium sulfate IVPB 2 g 50 mL (0 g Intravenous Stopped 11/22/20 1451)    ED Course  I have reviewed the triage vital signs and the nursing notes.  Pertinent labs & imaging results that were available during my care of the patient were reviewed by me and considered in my medical decision making (see chart for details).  Patient presents for evaluation of worsening upper respiratory symptoms.  He is afebrile, nonseptic appearing however does appear ill.  He had community-acquired pneumonia 2 months ago however left AMA without antibiotics.  Cough, shortness of breath worsening over the last 3 weeks.  Cough productive of yellow sputum.  Fever at dialysis on Friday, 3 days PTA however does not know his Covid status.  He is unvaccinated.  Does not look grossly fluid overloaded.  He denies any hemoptysis.  Does have some mild chest pain with cough however none at rest or exertion.  I have low suspicion for ACS, PE, dissection.  Does speak in short sentences with mild  sternal retractions.  He is tachypneic.  He has inspiratory and expiratory wheeze.  Will give steroids, breathing treatments, Covid test, reassess.  Labs and imaging personally reviewed and interpreted:  CBC without leukocytosis, hemoglobin 0.7 Metabolic panel with hyponatremia 134, creatinine 11.15 at baseline, calcium 7.8 at baseline Troponin XX 1, similar to prior Flu a positive EKG without ischemic changes  DG chest with possible PNA vs bronchitis.   Patient reassessed. Initial Oxygen at 90-94% on RA. Intermittent hypoxia with small movement to 89% RA placed on 2 L [place] with oxygen improved to 96-100%. Still with significant expiratory wheeze. Will start on mag, Duo nebs. COVID negative. FLU positive.  Patient reassessed. Persistent wheeze however with significant improvement in work of breath. Does have moderately tachypnea and WOB with movement. Attempted to remove supplemental Oxygen however 90% on RA, placed back on 2 L [place]. Will admit for influenza and likely COPD exacerbation.  CONSULT with Dr. Denton Brick with [facility] who agrees to evaluate patient for admission.  The patient appears reasonably stabilized for admission considering the current resources, flow, and capabilities available in the ED at this time, and I doubt any other [facility] requiring further screening and/or treatment in the ED prior to admission.    MDM Rules/Calculators/A&P                          Kerry Underwood was evaluated in Emergency Department on 11/22/2020 for the symptoms described in the history of present illness. He was evaluated in the context of the global COVID-19 pandemic, which necessitated consideration that the patient might be at risk for infection with the SARS-CoV-2 virus that causes COVID-19. Institutional protocols and algorithms that pertain to  the evaluation of patients at risk for COVID-19 are in a state of rapid change based on information released by regulatory bodies including the CDC and federal and  state organizations. These policies and algorithms were followed during the patient's care in the ED. Final Clinical Impression(s) / ED Diagnoses Final diagnoses:  Influenza  COPD with acute exacerbation ([place])  Acute respiratory failure with hypoxia [facility])    Rx / DC Orders ED Discharge Orders    None       Fitzpatrick Alberico A, PA-C 11/22/20 1559    Davonna Belling, MD 11/23/20 1531

## 2020-11-22 NOTE — ED Notes (Signed)
Pt received dinner tray, sitting upright in bed, 2LNC in place. NAD noted at this time.

## 2020-11-22 NOTE — H&P (Addendum)
History and Physical    Kerry Underwood GYJ:[id number] DOB: Mar 18, 1951 DOA: 11/22/2020  PCP: [name], FNP   Patient coming from: Home  I have personally briefly reviewed patient's old medical records in [place]  Chief Complaint: Difficulty breathing  HPI: Kerry Underwood is a 69 y.o. male with medical history significant for COPD, ESRD, hypertension, stroke. Patient presented to the ED with complaints of difficulty breathing over the past 2 weeks, over the past few days his breathing is significantly worsened.  He has associated productive cough, and wheezing.  He denies chest pain, no lower extremity swelling. And is on dialysis, schedule Tuesday Thursday Saturday, was unable to go for his dialysis today, not feeling well, his last dialysis session was Saturday 12/11. Patient is on vaccinated for COVID-19.  Reports he normally rolls his cigarettes, has not smoked in the past month.  But smokes about ~10 cigarettes per day.  ED Course: Temperature 98.1, blood pressure systolic 263-785, O2 sats down to 90% currently on 2 L O2.  Unremarkable CBC.  Troponin 21 > 19.  Respiratory virus panel positive for influenza negative for Covid.  Portable chest x-ray shows increased bronchial interstitial prominence in both lungs, complaint of active bronchitis with interstitial pneumonia cannot be excluded. 125 mg Solu-Medrol, 2g magnesium and duo nebs given.  Hospitalist to admit for COPD exacerbation and influenza infection.  Review of Systems: As per HPI all other systems reviewed and negative.  Past Medical History:  Diagnosis Date  . Anemia   . CHF (congestive heart failure) ([place])   . COPD (chronic obstructive pulmonary disease) ([place])   . Full dentures   . Hypertension   . Pyelonephritis 09/11/2017  . Renal disorder   . Stroke [facility])    " A few mini strokes, I didn't know it "    Past Surgical History:  Procedure Laterality Date  . BASCILIC VEIN TRANSPOSITION Right 04/19/2020    Procedure: Basilic Vein Transposition Right Arm;  Surgeon: Waynetta Sandy, MD;  Location: [place];  Service: Vascular;  Laterality: Right;  . INSERTION OF DIALYSIS CATHETER Right 04/19/2020   Procedure: Insertion Of Right Internal Jugular Dialysis Catheter;  Surgeon: Waynetta Sandy, MD;  Location: [place];  Service: Vascular;  Laterality: Right;  . MULTIPLE TOOTH EXTRACTIONS       reports that he has been smoking cigarettes. He has been smoking about 0.50 packs per day. He has quit using smokeless tobacco.  His smokeless tobacco use included chew. He reports previous drug use. Drug: Marijuana. He reports that he does not drink alcohol.  Allergies  Allergen Reactions  . Strawberry (Diagnostic)     Family History  Problem Relation Age of Onset  . Alzheimer's disease Mother     Prior to Admission medications   Medication Sig Start Date End Date Taking? Authorizing Provider  albuterol (VENTOLIN HFA) 108 (90 Base) MCG/ACT inhaler Inhale 2 puffs into the lungs every 6 (six) hours as needed for wheezing or shortness of breath. 04/20/20 09/17/20 Yes Alma Friendly, MD  amLODipine (NORVASC) 5 MG tablet Take 1 tablet (5 mg total) by mouth daily. 04/21/20 09/17/20 Yes Alma Friendly, MD  aspirin EC 325 MG tablet Take 325 mg by mouth daily.   Yes [provider]  metoprolol tartrate (LOPRESSOR) 25 MG tablet Take 1 tablet (25 mg total) by mouth 2 (two) times daily. 04/20/20 09/17/20 Yes Alma Friendly, MD  tamsulosin (FLOMAX) 0.4 MG CAPS capsule Take 0.4 mg by mouth  at bedtime. 05/24/20  Yes [provider]  VELPHORO 500 MG chewable tablet Chew 1,000 mg by mouth 3 (three) times daily. 11/08/20  Yes [provider]  calcium carbonate (OS-CAL - DOSED IN MG OF ELEMENTAL CALCIUM) 1250 (500 Ca) MG tablet Take 1 tablet (500 mg of elemental calcium total) by mouth at bedtime. 04/20/20 09/17/20  Alma Friendly, MD  tiotropium (SPIRIVA HANDIHALER) 18  MCG inhalation capsule Place 1 capsule (18 mcg total) into inhaler and inhale daily. 04/20/20 09/17/20  Alma Friendly, MD    Physical Exam: Vitals:   11/22/20 1300 11/22/20 1330 11/22/20 1357 11/22/20 1500  BP: 130/68 (!) 141/70  (!) 116/54  Pulse: 81 77  78  Resp: (!) 21 17  16   Temp:      TempSrc:      SpO2: 97% 99% 97% 100%  Weight:      Height:        Constitutional: NAD, calm, comfortable Vitals:   11/22/20 1300 11/22/20 1330 11/22/20 1357 11/22/20 1500  BP: 130/68 (!) 141/70  (!) 116/54  Pulse: 81 77  78  Resp: (!) 21 17  16   Temp:      TempSrc:      SpO2: 97% 99% 97% 100%  Weight:      Height:       Eyes: PERRL, lids and conjunctivae normal ENMT: Mucous membranes are moist.  Neck: normal, supple, no masses, no thyromegaly Respiratory: Diffuse expiratory wheezing, no crackles.  Mild increased work of breathing, no accessory muscle use.  Cardiovascular: Regular rate and rhythm, no murmurs / rubs / gallops. No extremity edema. 2+ pedal pulses. Abdomen: no tenderness, no masses palpated. No hepatosplenomegaly. Bowel sounds positive.  Musculoskeletal: no clubbing / cyanosis. No joint deformity upper and lower extremities. Good ROM, no contractures. Normal muscle tone.  Skin: no rashes, lesions, ulcers. No induration Neurologic: No apparent cranial normality, moving extremities spontaneously.Marland Kitchen  Psychiatric: Normal judgment and insight. Alert and oriented x 3. Normal mood.   Labs on Admission: I have personally reviewed following labs and imaging studies  CBC: Recent Labs  Lab 11/22/20 1105  WBC 4.1  NEUTROABS 3.1  HGB 11.7*  HCT 36.3*  MCV 96.5  PLT 767   Basic Metabolic Panel: Recent Labs  Lab 11/22/20 1105  NA 134*  K 4.8  CL 101  CO2 19*  GLUCOSE 93  BUN 75*  CREATININE 11.15*  CALCIUM 7.8*   Liver Function Tests: Recent Labs  Lab 11/22/20 1105  AST 17  ALT 7  ALKPHOS 39  BILITOT 0.4  PROT 7.2  ALBUMIN 3.4*    Radiological Exams on  Admission: DG Chest Portable 1 View  Result Date: 11/22/2020 CLINICAL DATA:  Shortness of breath and fever. EXAM: PORTABLE CHEST 1 VIEW COMPARISON:  09/17/2020 FINDINGS: The heart size is normal. Stable appearance of a tunneled right jugular dialysis catheter terminating at the SVC/RA junction. Lungs demonstrate a component of chronic disease as well as some increased bronchial and interstitial prominence in both lungs. A component of active bronchitis or interstitial pneumonia cannot be excluded. No overt pulmonary edema, pneumothorax or pleural fluid identified. IMPRESSION: Increased bronchial and interstitial prominence in both lungs. A component of active bronchitis or interstitial pneumonia cannot be excluded. Electronically Signed   By: Aletta Edouard M.D.   On: 11/22/2020 10:59    EKG: Independently reviewed.  Sinus rhythm, rate 84, QTC 495.  No significant changes compared to prior EKG.  Assessment/Plan Principal Problem:  COPD with acute exacerbation (HCC) Active Problems:   Influenza A Infection   Hypertension secondary to other renal disorders   Benign prostatic hyperplasia with urinary retention   ESRD (end stage renal disease) (HCC)  COPD exacerbation secondary to influenza A infection-dyspnea, wheezing, productive cough.  O2 sats down to 90% on room air, currently on 2 L.  Afebrile without leukocytosis.  Chest x-ray increased bronchial and interstitial prominence bilaterally, complains of active bronchitis or interstitial pneumonia to be excluded. -Tamiflu, pharmacy to dose -Doxycycline Iv (prolonged QTC noted) -125 mg Solu-Medrol given, continue 60 twice daily -DuoNebs scheduled as needed -Mucolytic scheduled  ESRD-schedule Tuesday Thursday Saturday, last HD session was on Saturday.  Blood pressure, electrolytes stable, clinically not in need of urgent HD. -Please consult nephrology for dialysis -Resume calcium carbonate, Velphoro  Prolonged QTC 495. K- 4.1. - Check  MAg  Hypertension-stable. -Resume Norvasc  BPH-  -Resume tamsulosin  DVT prophylaxis: Heparin Code Status: Full code Family Communication: None at bedside Disposition Plan:  ~ 2 days Consults called:  None Admission status:  Obs, tele    Bethena Roys MD [facility]  11/22/2020, 6:16 PM

## 2020-11-22 NOTE — ED Triage Notes (Addendum)
Pt c/o worsening SOB and cough that started 1 month ago. Pt was supposed to go to dialysis this morning but was unable to due to SOB. Pt reports he was at dialysis the other day and they told him he had a fever of 101 and tested him for Covid, but he is unsure of the results.

## 2020-11-23 DIAGNOSIS — Z9115 Patient's noncompliance with renal dialysis: Secondary | ICD-10-CM | POA: Diagnosis not present

## 2020-11-23 DIAGNOSIS — Z79899 Other long term (current) drug therapy: Secondary | ICD-10-CM | POA: Diagnosis not present

## 2020-11-23 DIAGNOSIS — N2581 Secondary hyperparathyroidism of renal origin: Secondary | ICD-10-CM | POA: Diagnosis present

## 2020-11-23 DIAGNOSIS — N401 Enlarged prostate with lower urinary tract symptoms: Secondary | ICD-10-CM | POA: Diagnosis present

## 2020-11-23 DIAGNOSIS — I151 Hypertension secondary to other renal disorders: Secondary | ICD-10-CM | POA: Diagnosis present

## 2020-11-23 DIAGNOSIS — Z8701 Personal history of pneumonia (recurrent): Secondary | ICD-10-CM | POA: Diagnosis not present

## 2020-11-23 DIAGNOSIS — Z7982 Long term (current) use of aspirin: Secondary | ICD-10-CM | POA: Diagnosis not present

## 2020-11-23 DIAGNOSIS — Z820 Family history of epilepsy and other diseases of the nervous system: Secondary | ICD-10-CM | POA: Diagnosis not present

## 2020-11-23 DIAGNOSIS — Z20822 Contact with and (suspected) exposure to covid-19: Secondary | ICD-10-CM | POA: Diagnosis present

## 2020-11-23 DIAGNOSIS — Z95828 Presence of other vascular implants and grafts: Secondary | ICD-10-CM | POA: Diagnosis not present

## 2020-11-23 DIAGNOSIS — J9601 Acute respiratory failure with hypoxia: Secondary | ICD-10-CM | POA: Diagnosis present

## 2020-11-23 DIAGNOSIS — F17210 Nicotine dependence, cigarettes, uncomplicated: Secondary | ICD-10-CM | POA: Diagnosis present

## 2020-11-23 DIAGNOSIS — J101 Influenza due to other identified influenza virus with other respiratory manifestations: Secondary | ICD-10-CM | POA: Diagnosis present

## 2020-11-23 DIAGNOSIS — Z8679 Personal history of other diseases of the circulatory system: Secondary | ICD-10-CM | POA: Diagnosis not present

## 2020-11-23 DIAGNOSIS — R338 Other retention of urine: Secondary | ICD-10-CM | POA: Diagnosis present

## 2020-11-23 DIAGNOSIS — J441 Chronic obstructive pulmonary disease with (acute) exacerbation: Secondary | ICD-10-CM | POA: Diagnosis present

## 2020-11-23 DIAGNOSIS — D631 Anemia in chronic kidney disease: Secondary | ICD-10-CM | POA: Diagnosis present

## 2020-11-23 DIAGNOSIS — J449 Chronic obstructive pulmonary disease, unspecified: Secondary | ICD-10-CM | POA: Diagnosis present

## 2020-11-23 DIAGNOSIS — E8889 Other specified metabolic disorders: Secondary | ICD-10-CM | POA: Diagnosis present

## 2020-11-23 DIAGNOSIS — N186 End stage renal disease: Secondary | ICD-10-CM | POA: Diagnosis present

## 2020-11-23 DIAGNOSIS — I4581 Long QT syndrome: Secondary | ICD-10-CM | POA: Diagnosis present

## 2020-11-23 DIAGNOSIS — Z992 Dependence on renal dialysis: Secondary | ICD-10-CM | POA: Diagnosis not present

## 2020-11-23 DIAGNOSIS — Z8673 Personal history of transient ischemic attack (TIA), and cerebral infarction without residual deficits: Secondary | ICD-10-CM | POA: Diagnosis not present

## 2020-11-23 LAB — BASIC METABOLIC PANEL
Anion gap: 15 (ref 5–15)
BUN: 85 mg/dL — ABNORMAL HIGH (ref 8–23)
CO2: 17 mmol/L — ABNORMAL LOW (ref 22–32)
Calcium: 7.8 mg/dL — ABNORMAL LOW (ref 8.9–10.3)
Chloride: 103 mmol/L (ref 98–111)
Creatinine, Ser: 11.68 mg/dL — ABNORMAL HIGH (ref 0.61–1.24)
GFR, Estimated: 4 mL/min — ABNORMAL LOW (ref >60)
Glucose, Bld: 161 mg/dL — ABNORMAL HIGH (ref 70–99)
Potassium: 4.3 mmol/L (ref 3.5–5.1)
Sodium: 135 mmol/L (ref 135–145)

## 2020-11-23 LAB — RENAL FUNCTION PANEL
Albumin: 3.2 g/dL — ABNORMAL LOW (ref 3.5–5.0)
Anion gap: 14 (ref 5–15)
BUN: 103 mg/dL — ABNORMAL HIGH (ref 8–23)
CO2: 16 mmol/L — ABNORMAL LOW (ref 22–32)
Calcium: 7.9 mg/dL — ABNORMAL LOW (ref 8.9–10.3)
Chloride: 102 mmol/L (ref 98–111)
Creatinine, Ser: 11.48 mg/dL — ABNORMAL HIGH (ref 0.61–1.24)
GFR, Estimated: 4 mL/min — ABNORMAL LOW (ref >60)
Glucose, Bld: 134 mg/dL — ABNORMAL HIGH (ref 70–99)
Phosphorus: 7.7 mg/dL — ABNORMAL HIGH (ref 2.5–4.6)
Potassium: 4.8 mmol/L (ref 3.5–5.1)
Sodium: 132 mmol/L — ABNORMAL LOW (ref 135–145)

## 2020-11-23 LAB — CBC
HCT: 33.6 % — ABNORMAL LOW (ref 39.0–52.0)
Hemoglobin: 10.9 g/dL — ABNORMAL LOW (ref 13.0–17.0)
MCH: 31.1 pg (ref 26.0–34.0)
MCHC: 32.4 g/dL (ref 30.0–36.0)
MCV: 95.7 fL (ref 80.0–100.0)
Platelets: 169 10*3/uL (ref 150–400)
RBC: 3.51 MIL/uL — ABNORMAL LOW (ref 4.22–5.81)
RDW: 15.4 % (ref 11.5–15.5)
WBC: 5.5 10*3/uL (ref 4.0–10.5)
nRBC: 0 % (ref 0.0–0.2)

## 2020-11-23 MED ORDER — LIDOCAINE HCL (PF) 1 % IJ SOLN: 5.0000 mL | INTRAMUSCULAR | Status: DC | PRN

## 2020-11-23 MED ORDER — SODIUM CHLORIDE 0.9 % IV SOLN
100.0000 mL | INTRAVENOUS | Status: DC | PRN
Start: 2020-11-23 — End: 2020-11-25

## 2020-11-23 MED ORDER — PREDNISONE 20 MG PO TABS
60.0000 mg | ORAL_TABLET | Freq: Every day | ORAL | Status: DC
  Administered 2020-11-23 – 2020-11-24 (×2): 60 mg via ORAL
  Filled 2020-11-23: qty 1
  Filled 2020-11-23: qty 3

## 2020-11-23 MED ORDER — PENTAFLUOROPROP-TETRAFLUOROETH EX AERO: 1.0000 "application " | INHALATION_SPRAY | CUTANEOUS | Status: DC | PRN

## 2020-11-23 MED ORDER — HEPARIN SODIUM (PORCINE) 1000 UNIT/ML DIALYSIS
1000.0000 [IU] | INTRAMUSCULAR | Status: DC | PRN
  Filled 2020-11-23: qty 1

## 2020-11-23 MED ORDER — LIDOCAINE-PRILOCAINE 2.5-2.5 % EX CREA: 1.0000 "application " | TOPICAL_CREAM | CUTANEOUS | Status: DC | PRN

## 2020-11-23 MED ORDER — ALTEPLASE 2 MG IJ SOLR
2.0000 mg | Freq: Once | INTRAMUSCULAR | Status: DC | PRN
  Filled 2020-11-23: qty 2

## 2020-11-23 MED ORDER — CHLORHEXIDINE GLUCONATE CLOTH 2 % EX PADS
6.0000 | MEDICATED_PAD | Freq: Every day | CUTANEOUS | Status: DC
  Administered 2020-11-24: 08:00:00 6 via TOPICAL

## 2020-11-23 MED ORDER — SODIUM CHLORIDE 0.9 % IV SOLN: 100.0000 mL | INTRAVENOUS | Status: DC | PRN

## 2020-11-23 NOTE — ED Notes (Signed)
Pt was given a lunch tray

## 2020-11-23 NOTE — ED Notes (Signed)
Pt hooked back up to telemetry monitor.

## 2020-11-23 NOTE — Progress Notes (Signed)
PROGRESS NOTE   Kerry Underwood  JME:[id number] DOB: 1951/10/30 DOA: 11/22/2020 PCP: [name], FNP  Brief Narrative:  69 year old white M Known history of COPD HTN BPH prior CVA Recent admission 04/28/2020 found to have renal failure started on TTS dialysis through right upper extremity fistula Presented with SOP DOE Respiratory viral panel negative for Covid but positive for flu-started on Solu-Medrol magnesium duo nebs given  Assessment & Plan:   Principal Problem:   COPD with acute exacerbation ([place]) Active Problems:   Hypertension secondary to other renal disorders   Benign prostatic hyperplasia with urinary retention   ESRD (end stage renal disease) ([place])   Influenza A Infection   1. COPD exacerbation secondary to H. influenzae a a. Continue doxycycline for exacerbation, switch Solu-Medrol to prednisone 60 b. Continue Tamiflu Tuesday Thursday Saturday for total of 2 doses c. Not requiring oxygen so expect rapid improvement over next 24 to 48 hours and likely can discharge home subsequently 2. ESRD TTS a. Appreciate nephrology input in advance b. Obtain phosphorus in a.m. 3. Prolonged QTC a. Monitor trends while on inhalers etc. 4. Hypertension a. Continue amlodipine 5, metoprolol 25 twice daily 5. Prior CVA a. Continue aspirin 325 mg daily b. No discernible weakness 6. BPH a. Flomax 0.4 at bedtime  DVT prophylaxis: Heparin Code Status: Full Family Communication: None Disposition:  Status is: Observation  The patient remains OBS appropriate and will d/c before 2 midnights.  Dispo: The patient is from: Home              Anticipated d/c is to: Home              Anticipated d/c date is: 1 day              Patient currently is not medically stable to d/c.       Consultants:   None other than nephrology  Procedures: None awake alert no distress  Antimicrobials: Doxycycline Tamiflu   Subjective: Sitting up in bed but still somewhat wheezy  "I feel  tired" Eating and drinking  Objective: Vitals:   11/23/20 0400 11/23/20 0600 11/23/20 0712 11/23/20 0730  BP: 131/69 (!) 119/56 135/72 (!) 145/76  Pulse: 69 66 70 72  Resp: 18 16  (!) 24  Temp:      TempSrc:      SpO2: 98% 100%  97%  Weight:      Height:        Intake/Output Summary (Last 24 hours) at 11/23/2020 2297 Last data filed at 11/22/2020 1451 Gross per 24 hour  Intake 50 ml  Output --  Net 50 ml   Filed Weights   11/22/20 1004  Weight: 72.6 kg    Examination: EOMI NCAT no focal deficit looks about stated age When I take the oxygen off does not go below 97 Chest wheezy bilaterally no rales no rhonchi S1-S2 no murmur Abdomen soft nontender no rebound no guarding Neurologically intact moving all 4 limbs equally   Data Reviewed: I have personally reviewed following labs and imaging studies CO2 17 BUN/creatinine 85/11.6 Troponin XIX   Radiology Studies: DG Chest Portable 1 View  Result Date: 11/22/2020 CLINICAL DATA:  Shortness of breath and fever. EXAM: PORTABLE CHEST 1 VIEW COMPARISON:  09/17/2020 FINDINGS: The heart size is normal. Stable appearance of a tunneled right jugular dialysis catheter terminating at the SVC/RA junction. Lungs demonstrate a component of chronic disease as well as some increased bronchial and interstitial prominence in both lungs. A component  of active bronchitis or interstitial pneumonia cannot be excluded. No overt pulmonary edema, pneumothorax or pleural fluid identified. IMPRESSION: Increased bronchial and interstitial prominence in both lungs. A component of active bronchitis or interstitial pneumonia cannot be excluded. Electronically Signed   By: Aletta Edouard M.D.   On: 11/22/2020 10:59     Scheduled Meds: . amLODipine  5 mg Oral Daily  . aspirin EC  325 mg Oral Daily  . calcium carbonate  1 tablet Oral QHS  . guaiFENesin-dextromethorphan  10 mL Oral Q8H  . heparin  5,000 Units Subcutaneous Q8H  . ipratropium-albuterol   3 mL Nebulization Q6H  . methylPREDNISolone (SOLU-MEDROL) injection  60 mg Intravenous Q12H  . metoprolol tartrate  25 mg Oral BID  . [START ON 11/24/2020] oseltamivir  30 mg Oral Q T,Th,Sat-1800  . sucroferric oxyhydroxide  1,000 mg Oral TID  . tamsulosin  0.4 mg Oral QHS   Continuous Infusions: . doxycycline (VIBRAMYCIN) IV 100 mg (11/23/20 0519)     LOS: 0 days    Time spent: [place], MD [facility] To contact the attending provider between 7A-7P or the covering provider during after hours 7P-7A, please log into the web site [email or web address] and access using universal [place] password for that web site. If you do not have the password, please call the hospital operator.  11/23/2020, 8:12 AM

## 2020-11-23 NOTE — ED Notes (Signed)
Attempted to reach out to listed attending at 0135 without a response. Spoke with Dr. Ander Slade who is covering regarding orders per [facility]. Pt's meds were ordered so they will stagger per [facility]. administered what was available, calcium carbonate and sucroferric oxyhydroxide (VELPHORO) to be administered at 0600. Per Dr. Clearence Ped, Velphoro can be administered with a snack since meal will not be available at that time. AP made aware of medications that are not available in pyxis.

## 2020-11-23 NOTE — ED Notes (Signed)
Pharmacy to bring down Velphoro.

## 2020-11-23 NOTE — Consult Note (Signed)
[facility] Renal Consultation Note    Indication for Consultation:  Management of ESRD/hemodialysis; anemia, hypertension/volume and secondary hyperparathyroidism  HPI: Kerry Underwood is a 69 y.o. male with a PMH significant for longstanding HTN, CVA, CHF, COPD, medical noncompliance, and ESRD who presented to [facility] ED with a 2 week history of worsening SOB, fatigue, cough, and wheezing.  In the ED he was hypoxic with SpO2 of 90% on 2 liters O2, temp of 98.1, and respiratory panel negative for covid-19 but + for influenza A.  CXR with increased bronchial interstitial prominence bilaterally and was admitted for further management.  We were consulted to provide HD during his hospitalization.    Of note, he has a history of noncompliance with dialysis and medical follow up. He missed HD yesterday but did go on Saturday.  Past Medical History:  Diagnosis Date  . Anemia   . CHF (congestive heart failure) ([place])   . COPD (chronic obstructive pulmonary disease) ([place])   . Full dentures   . Hypertension   . Pyelonephritis 09/11/2017  . Renal disorder   . Stroke [facility])    " A few mini strokes, I didn't know it "   Past Surgical History:  Procedure Laterality Date  . BASCILIC VEIN TRANSPOSITION Right 04/19/2020   Procedure: Basilic Vein Transposition Right Arm;  Surgeon: Waynetta Sandy, MD;  Location: [place];  Service: Vascular;  Laterality: Right;  . INSERTION OF DIALYSIS CATHETER Right 04/19/2020   Procedure: Insertion Of Right Internal Jugular Dialysis Catheter;  Surgeon: Waynetta Sandy, MD;  Location: [place];  Service: Vascular;  Laterality: Right;  . MULTIPLE TOOTH EXTRACTIONS     Family History:   Family History  Problem Relation Age of Onset  . Alzheimer's disease Mother    Social History:  reports that he has been smoking cigarettes. He has been smoking about 0.50 packs per day. He has quit using smokeless tobacco.  His smokeless tobacco use included chew.  He reports previous drug use. Drug: Marijuana. He reports that he does not drink alcohol. Allergies  Allergen Reactions  . Strawberry (Diagnostic)    Prior to Admission medications   Medication Sig Start Date End Date Taking? Authorizing Provider  albuterol (VENTOLIN HFA) 108 (90 Base) MCG/ACT inhaler Inhale 2 puffs into the lungs every 6 (six) hours as needed for wheezing or shortness of breath. 04/20/20 09/17/20 Yes Alma Friendly, MD  amLODipine (NORVASC) 5 MG tablet Take 1 tablet (5 mg total) by mouth daily. 04/21/20 09/17/20 Yes Alma Friendly, MD  aspirin EC 325 MG tablet Take 325 mg by mouth daily.   Yes [provider]  metoprolol tartrate (LOPRESSOR) 25 MG tablet Take 1 tablet (25 mg total) by mouth 2 (two) times daily. 04/20/20 09/17/20 Yes Alma Friendly, MD  tamsulosin (FLOMAX) 0.4 MG CAPS capsule Take 0.4 mg by mouth at bedtime. 05/24/20  Yes [provider]  VELPHORO 500 MG chewable tablet Chew 1,000 mg by mouth 3 (three) times daily. 11/08/20  Yes [provider]  calcium carbonate (OS-CAL - DOSED IN MG OF ELEMENTAL CALCIUM) 1250 (500 Ca) MG tablet Take 1 tablet (500 mg of elemental calcium total) by mouth at bedtime. 04/20/20 09/17/20  Alma Friendly, MD  tiotropium (SPIRIVA HANDIHALER) 18 MCG inhalation capsule Place 1 capsule (18 mcg total) into inhaler and inhale daily. 04/20/20 09/17/20  Alma Friendly, MD   Current Facility-Administered Medications  Medication Dose Route Frequency Provider Last Rate Last Admin  .  acetaminophen (TYLENOL) tablet 650 mg  650 mg Oral Q6H PRN Emokpae, Ejiroghene E, MD       Or  . acetaminophen (TYLENOL) suppository 650 mg  650 mg Rectal Q6H PRN Emokpae, Ejiroghene E, MD      . amLODipine (NORVASC) tablet 5 mg  5 mg Oral Daily Emokpae, Ejiroghene E, MD   5 mg at 11/23/20 1014  . aspirin EC tablet 325 mg  325 mg Oral Daily Emokpae, Ejiroghene E, MD   325 mg at 11/23/20 1013  . calcium carbonate  (OS-CAL - dosed in mg of elemental calcium) tablet 500 mg of elemental calcium  1 tablet Oral QHS Emokpae, Ejiroghene E, MD      . doxycycline (VIBRAMYCIN) 100 mg in sodium chloride 0.9 % 250 mL IVPB  100 mg Intravenous Q12H Emokpae, Ejiroghene E, MD 125 mL/hr at 11/23/20 0519 100 mg at 11/23/20 0519  . guaiFENesin-dextromethorphan (ROBITUSSIN DM) 100-10 MG/5ML syrup 10 mL  10 mL Oral Q8H Emokpae, Ejiroghene E, MD   10 mL at 11/23/20 1011  . heparin injection 5,000 Units  5,000 Units Subcutaneous Q8H Emokpae, Ejiroghene E, MD   5,000 Units at 11/23/20 1017  . ipratropium-albuterol (DUONEB) 0.5-2.5 (3) MG/3ML nebulizer solution 3 mL  3 mL Nebulization Q4H PRN Emokpae, Ejiroghene E, MD      . ipratropium-albuterol (DUONEB) 0.5-2.5 (3) MG/3ML nebulizer solution 3 mL  3 mL Nebulization Q6H Emokpae, Ejiroghene E, MD   3 mL at 11/23/20 0859  . metoprolol tartrate (LOPRESSOR) tablet 25 mg  25 mg Oral BID Emokpae, Ejiroghene E, MD   25 mg at 11/23/20 1013  . ondansetron (ZOFRAN) tablet 4 mg  4 mg Oral Q6H PRN Emokpae, Ejiroghene E, MD       Or  . ondansetron (ZOFRAN) injection 4 mg  4 mg Intravenous Q6H PRN Emokpae, Ejiroghene E, MD      . Derrill Memo ON 11/24/2020] oseltamivir (TAMIFLU) capsule 30 mg  30 mg Oral Q T,Th,Sat-1800 Emokpae, Ejiroghene E, MD      . polyethylene glycol (MIRALAX / GLYCOLAX) packet 17 g  17 g Oral Daily PRN Emokpae, Ejiroghene E, MD      . predniSONE (DELTASONE) tablet 60 mg  60 mg Oral QAC breakfast Nita Sells, MD   60 mg at 11/23/20 1014  . sucroferric oxyhydroxide (VELPHORO) chewable tablet 1,000 mg  1,000 mg Oral TID Emokpae, Ejiroghene E, MD      . tamsulosin (FLOMAX) capsule 0.4 mg  0.4 mg Oral QHS Emokpae, Ejiroghene E, MD   0.4 mg at 11/23/20 0204   Current Outpatient Medications  Medication Sig Dispense Refill  . albuterol (VENTOLIN HFA) 108 (90 Base) MCG/ACT inhaler Inhale 2 puffs into the lungs every 6 (six) hours as needed for wheezing or shortness of breath. 6.7  g 0  . amLODipine (NORVASC) 5 MG tablet Take 1 tablet (5 mg total) by mouth daily. 30 tablet 0  . aspirin EC 325 MG tablet Take 325 mg by mouth daily.    . metoprolol tartrate (LOPRESSOR) 25 MG tablet Take 1 tablet (25 mg total) by mouth 2 (two) times daily. 60 tablet 0  . tamsulosin (FLOMAX) 0.4 MG CAPS capsule Take 0.4 mg by mouth at bedtime.    . VELPHORO 500 MG chewable tablet Chew 1,000 mg by mouth 3 (three) times daily.    . calcium carbonate (OS-CAL - DOSED IN MG OF ELEMENTAL CALCIUM) 1250 (500 Ca) MG tablet Take 1 tablet (500 mg of elemental calcium total) by  mouth at bedtime. 30 tablet 0  . tiotropium (SPIRIVA HANDIHALER) 18 MCG inhalation capsule Place 1 capsule (18 mcg total) into inhaler and inhale daily. 30 capsule 0   Labs: Basic Metabolic Panel: Recent Labs  Lab 11/22/20 1105 11/23/20 0322  NA 134* 135  K 4.8 4.3  CL 101 103  CO2 19* 17*  GLUCOSE 93 161*  BUN 75* 85*  CREATININE 11.15* 11.68*  CALCIUM 7.8* 7.8*   Liver Function Tests: Recent Labs  Lab 11/22/20 1105  AST 17  ALT 7  ALKPHOS 39  BILITOT 0.4  PROT 7.2  ALBUMIN 3.4*   No results for input(s): LIPASE, AMYLASE in the last 168 hours. No results for input(s): AMMONIA in the last 168 hours. CBC: Recent Labs  Lab 11/22/20 1105  WBC 4.1  NEUTROABS 3.1  HGB 11.7*  HCT 36.3*  MCV 96.5  PLT 156   Cardiac Enzymes: No results for input(s): CKTOTAL, CKMB, CKMBINDEX, TROPONINI in the last 168 hours. CBG: No results for input(s): GLUCAP in the last 168 hours. Iron Studies: No results for input(s): IRON, TIBC, TRANSFERRIN, FERRITIN in the last 72 hours. Studies/Results: DG Chest Portable 1 View  Result Date: 11/22/2020 CLINICAL DATA:  Shortness of breath and fever. EXAM: PORTABLE CHEST 1 VIEW COMPARISON:  09/17/2020 FINDINGS: The heart size is normal. Stable appearance of a tunneled right jugular dialysis catheter terminating at the SVC/RA junction. Lungs demonstrate a component of chronic disease  as well as some increased bronchial and interstitial prominence in both lungs. A component of active bronchitis or interstitial pneumonia cannot be excluded. No overt pulmonary edema, pneumothorax or pleural fluid identified. IMPRESSION: Increased bronchial and interstitial prominence in both lungs. A component of active bronchitis or interstitial pneumonia cannot be excluded. Electronically Signed   By: Aletta Edouard M.D.   On: 11/22/2020 10:59    ROS: Pertinent items are noted in HPI. Physical Exam: Vitals:   11/23/20 0930 11/23/20 1000 11/23/20 1013 11/23/20 1030  BP: 132/73 133/67 133/67 131/61  Pulse: 66 65 69 68  Resp: 16 20  17   Temp:      TempSrc:      SpO2: 96% 93%  96%  Weight:      Height:          Weight change:   Intake/Output Summary (Last 24 hours) at 11/23/2020 1121 Last data filed at 11/22/2020 1451 Gross per 24 hour  Intake 50 ml  Output --  Net 50 ml   BP 131/61   Pulse 68   Temp 98.1 F (36.7 C) (Oral)   Resp 17   Ht 5\' 5"  (1.651 m)   Wt 72.6 kg   SpO2 96%   BMI 26.63 kg/m  General appearance: alert, cooperative, fatigued and no distress Head: Normocephalic, without obvious abnormality, atraumatic Resp: rhonchi bilaterally and wheezes bilaterally Cardio: regular rate and rhythm and no rub GI: soft, non-tender; bowel sounds normal; no masses,  no organomegaly Extremities: extremities normal, atraumatic, no cyanosis or edema and RUE AVF +T/B Dialysis Access:  Dialysis Orders: Center: [facility]  on TTS . EDW 80kg HD Bath 2K/2.5Ca  Time 4:15 profile 4 Heparin none. Access RIJ TDC BFR 400 DFR 800    Calcitriol 0.75 mcg po/HD   Assessment/Plan: 1.  COPD exacerbation due to influenza A infection- currently on solumedrol, tamiflu, and nebulizers with some improvement.  Plan per primary 2.  ESRD -   Will plan for HD tomorrow to get back on schedule. 3.  Hypertension/volume  -  stable 4.  Anemia  - stable 5.  Metabolic bone disease -   Continue with  outpatient meds 6.  Nutrition -  Renal diet 7. Prolonged QTc- follow. 8. Vascular access- s/p first stage RUE BVT but has not scheduled second stage 9. BPH on tamsulosin  [name], MD [place] Pager [phone] 11/23/2020, 11:21 AM

## 2020-11-24 MED ORDER — PREDNISONE 20 MG PO TABS: 60.0000 mg | ORAL_TABLET | Freq: Every day | ORAL | 0 refills | Status: DC

## 2020-11-24 MED ORDER — HEPARIN SODIUM (PORCINE) 1000 UNIT/ML DIALYSIS
3200.0000 [IU] | INTRAMUSCULAR | Status: DC | PRN
  Administered 2020-11-24 (×2): 3200 [IU] via INTRAVENOUS_CENTRAL

## 2020-11-24 MED ORDER — DOXYCYCLINE HYCLATE 100 MG PO TABS: 100.0000 mg | ORAL_TABLET | Freq: Two times a day (BID) | ORAL | Status: DC

## 2020-11-24 NOTE — Progress Notes (Signed)
[facility] Progress Note    Assessment/ Plan:   1.  Acute on chronic COPD exacerbation due to influenza A infection- currently on solumedrol, tamiflu, and nebulizers with some improvement. Still requiring O2 on and off. Plan per primary 2.  ESRD -   Will plan for HD today to get back on schedule. 3.  Hypertension/volume  - stable 4.  Anemia  - stable 5.  Metabolic bone disease -   Continue with outpatient meds 6.  Nutrition -  Renal diet 7. Prolonged QTc- follow. 8. Vascular access- s/p first stage RUE BVT but has not scheduled second stage 9. BPH on tamsulosin  Outpatient Dialysis Orders: Center: [facility]  on TTS . EDW 80kg HD Bath 2K/2.5Ca  Time 4:15 profile 4 Heparin none. Access RIJ TDC BFR 400 DFR 800    Calcitriol 0.75 mcg po/HD   Gean Quint, MD [facility]   Subjective:   On [place] on and off for comfort, no complaints. HD today   Objective:   BP (!) 160/75   Pulse 65   Temp 97.7 F (36.5 C)   Resp 18   Ht 5\' 5"  (1.651 m)   Wt 79.8 kg   SpO2 100%   BMI 29.28 kg/m   Intake/Output Summary (Last 24 hours) at 11/24/2020 0912 Last data filed at 11/24/2020 9892 Gross per 24 hour  Intake 742.08 ml  Output --  Net 742.08 ml   Weight change: 7.224 kg  Physical Exam: Gen:nad, laying flat in bed CVS:rrr Resp:normal wob JJH:ERDE, nt Ext:no edema Access: rue avf +b/t   Imaging: DG Chest Portable 1 View  Result Date: 11/22/2020 CLINICAL DATA:  Shortness of breath and fever. EXAM: PORTABLE CHEST 1 VIEW COMPARISON:  09/17/2020 FINDINGS: The heart size is normal. Stable appearance of a tunneled right jugular dialysis catheter terminating at the SVC/RA junction. Lungs demonstrate a component of chronic disease as well as some increased bronchial and interstitial prominence in both lungs. A component of active bronchitis or interstitial pneumonia cannot be excluded. No overt pulmonary edema, pneumothorax or pleural fluid identified. IMPRESSION:  Increased bronchial and interstitial prominence in both lungs. A component of active bronchitis or interstitial pneumonia cannot be excluded. Electronically Signed   By: Aletta Edouard M.D.   On: 11/22/2020 10:59    Labs: BMET Recent Labs  Lab 11/22/20 1105 11/23/20 0322 11/23/20 2243  NA 134* 135 132*  K 4.8 4.3 4.8  CL 101 103 102  CO2 19* 17* 16*  GLUCOSE 93 161* 134*  BUN 75* 85* 103*  CREATININE 11.15* 11.68* 11.48*  CALCIUM 7.8* 7.8* 7.9*  PHOS  --   --  7.7*   CBC Recent Labs  Lab 11/22/20 1105 11/23/20 2243  WBC 4.1 5.5  NEUTROABS 3.1  --   HGB 11.7* 10.9*  HCT 36.3* 33.6*  MCV 96.5 95.7  PLT 156 169    Medications:    . amLODipine  5 mg Oral Daily  . aspirin EC  325 mg Oral Daily  . calcium carbonate  1 tablet Oral QHS  . Chlorhexidine Gluconate Cloth  6 each Topical Q0600  . doxycycline  100 mg Oral Q12H  . heparin  5,000 Units Subcutaneous Q8H  . metoprolol tartrate  25 mg Oral BID  . oseltamivir  30 mg Oral Q T,Th,Sat-1800  . predniSONE  60 mg Oral QAC breakfast  . sucroferric oxyhydroxide  1,000 mg Oral TID  . tamsulosin  0.4 mg Oral QHS  Gean Quint, MD [facility] 11/24/2020, 9:12 AM

## 2020-11-24 NOTE — Discharge Summary (Signed)
Physician Discharge Summary  Kerry Underwood DTO:[id number] DOB: 1951-05-21 DOA: 11/22/2020  PCP: [name], FNP  Admit date: 11/22/2020 Discharge date: 11/24/2020  Time spent: 22 minutes  Recommendations for Outpatient Follow-up:  1. Cbc + renal panel in 1 week 2. Need spirometry in follow-up as OP  Discharge Diagnoses:  Principal Problem:   COPD with acute exacerbation (Kerry Underwood) Active Problems:   Hypertension secondary to other renal disorders   Benign prostatic hyperplasia with urinary retention   ESRD (end stage renal disease) (H[facility])   Influenza A Infection   COPD (chronic obstructive pulmonary disease) (Kerry Underwood)   Discharge Condition: improved  Diet recommendation: heart healthy  Filed Weights   11/22/20 1004 11/24/20 0241  Weight: 72.6 kg 79.8 kg    History of present illness:  69 year old white M Known history of COPD HTN BPH prior CVA Recent admission 04/28/2020 found to have renal failure started on TTS dialysis through right upper extremity fistula Presented with SOP DOE Respiratory viral panel negative for Covid but positive for flu-started on Solu-Medrol magnesium duo nebs given  Hospital Course:   1. COPD exacerbation secondary to H. influenzae a a. stopped doxycycline for exacerbation, switch Solu-Medrol to prednisone 60 for 4 days then stop b. Completed 2 doses tamiflu c. Sent home on prednisone 2. ESRD TTS a. Appreciate nephrology input in advance b. Obtain phosphorus in a.m. c. May sensipar and renvela as OP 3. Prolonged QTC a. Monitor trends while on inhalers etc. 4. Hypertension a. Continue amlodipine 5, metoprolol 25 twice daily 5. Prior CVA a. Continue aspirin 325 mg daily b. No discernible weakness 6. BPH a. Flomax 0.4 at bedtime   Discharge Exam: Vitals:   11/24/20 0629 11/24/20 0838  BP: (!) 160/75   Pulse: 70 65  Resp: 18   Temp: 97.7 F (36.5 C)   SpO2: 99% 100%    General: eomi ncat no ict no pallor Cardiovascular: s1 s2 no  m/r/g Respiratory: no wheeze no rales no rhonchi  Discharge Instructions   Discharge Instructions    Diet - low sodium heart healthy   Complete by: As directed    Discharge instructions   Complete by: As directed    Please make sure you follow-up with your primary physician in about 1 week and continue to use your inhalers We will give you a prescription for prednisone 60 mg to take for the next 4 days to help break the spasm in your lungs please complete the course please desist from smoking if you can and use your inhalers appropriately please ensure that you follow-up in the outpatient setting as dictated above   Increase activity slowly   Complete by: As directed    No wound care   Complete by: As directed      Allergies as of 11/24/2020      Reactions   Strawberry (diagnostic)       Medication List    TAKE these medications   albuterol 108 (90 Base) MCG/ACT inhaler Commonly known as: VENTOLIN HFA Inhale 2 puffs into the lungs every 6 (six) hours as needed for wheezing or shortness of breath.   amLODipine 5 MG tablet Commonly known as: NORVASC Take 1 tablet (5 mg total) by mouth daily.   aspirin EC 325 MG tablet Take 325 mg by mouth daily.   calcium carbonate 1250 (500 Ca) MG tablet Commonly known as: OS-CAL - dosed in mg of elemental calcium Take 1 tablet (500 mg of elemental calcium total) by mouth at bedtime.  metoprolol tartrate 25 MG tablet Commonly known as: LOPRESSOR Take 1 tablet (25 mg total) by mouth 2 (two) times daily.   predniSONE 20 MG tablet Commonly known as: DELTASONE Take 3 tablets (60 mg total) by mouth daily before breakfast. Start taking on: November 25, 2020   Spiriva HandiHaler 18 MCG inhalation capsule Generic drug: tiotropium Place 1 capsule (18 mcg total) into inhaler and inhale daily.   tamsulosin 0.4 MG Caps capsule Commonly known as: FLOMAX Take 0.4 mg by mouth at bedtime.   Velphoro 500 MG chewable tablet Generic drug:  sucroferric oxyhydroxide Chew 1,000 mg by mouth 3 (three) times daily.      Allergies  Allergen Reactions  . Strawberry (Diagnostic)       The results of significant diagnostics from this hospitalization (including imaging, microbiology, ancillary and laboratory) are listed below for reference.    Significant Diagnostic Studies: DG Chest Portable 1 View  Result Date: 11/22/2020 CLINICAL DATA:  Shortness of breath and fever. EXAM: PORTABLE CHEST 1 VIEW COMPARISON:  09/17/2020 FINDINGS: The heart size is normal. Stable appearance of a tunneled right jugular dialysis catheter terminating at the SVC/RA junction. Lungs demonstrate a component of chronic disease as well as some increased bronchial and interstitial prominence in both lungs. A component of active bronchitis or interstitial pneumonia cannot be excluded. No overt pulmonary edema, pneumothorax or pleural fluid identified. IMPRESSION: Increased bronchial and interstitial prominence in both lungs. A component of active bronchitis or interstitial pneumonia cannot be excluded. Electronically Signed   By: Aletta Edouard M.D.   On: 11/22/2020 10:59    Microbiology: Recent Results (from the past 240 hour(s))  Resp Panel by RT-PCR (Flu A&B, Covid) Nasopharyngeal Swab     Status: Abnormal   Collection Time: 11/22/20 12:08 PM   Specimen: Nasopharyngeal Swab; Nasopharyngeal(NP) swabs in vial transport medium  Result Value Ref Range Status   SARS Coronavirus 2 by RT PCR NEGATIVE NEGATIVE Final    Comment: (NOTE) SARS-CoV-2 target nucleic acids are NOT DETECTED.  The SARS-CoV-2 RNA is generally detectable in upper respiratory specimens during the acute phase of infection. The lowest concentration of SARS-CoV-2 viral copies this assay can detect is 138 copies/mL. A negative result does not preclude SARS-Cov-2 infection and should not be used as the sole basis for treatment or other patient management decisions. A negative result may  occur with  improper specimen collection/handling, submission of specimen other than nasopharyngeal swab, presence of viral mutation(s) within the areas targeted by this assay, and inadequate number of viral copies(<138 copies/mL). A negative result must be combined with clinical observations, patient history, and epidemiological information. The expected result is Negative.  Fact Sheet for Patients:  EntrepreneurPulse.com.au  Fact Sheet for Healthcare Providers:  IncredibleEmployment.be  This test is no t yet approved or cleared by the [place] FDA and  has been authorized for detection and/or diagnosis of SARS-CoV-2 by FDA under an Emergency Use Authorization (EUA). This EUA will remain  in effect (meaning this test can be used) for the duration of the COVID-19 declaration under Section 564(b)(1) of the Act, 21 U.S.C.section 360bbb-3(b)(1), unless the authorization is terminated  or revoked sooner.       Influenza A by PCR POSITIVE (A) NEGATIVE Final   Influenza B by PCR NEGATIVE NEGATIVE Final    Comment: (NOTE) The Xpert Xpress SARS-CoV-2/FLU/RSV plus assay is intended as an aid in the diagnosis of influenza from Nasopharyngeal swab specimens and should not be used as a sole basis for  treatment. Nasal washings and aspirates are unacceptable for Xpert Xpress SARS-CoV-2/FLU/RSV testing.  Fact Sheet for Patients: EntrepreneurPulse.com.au  Fact Sheet for Healthcare Providers: IncredibleEmployment.be  This test is not yet approved or cleared by the [place] FDA and has been authorized for detection and/or diagnosis of SARS-CoV-2 by FDA under an Emergency Use Authorization (EUA). This EUA will remain in effect (meaning this test can be used) for the duration of the COVID-19 declaration under Section 564(b)(1) of the Act, 21 U.S.C. section 360bbb-3(b)(1), unless the authorization is terminated  or revoked.  Performed at [facility], [address] [place], [place] [address]      Labs: Basic Metabolic Panel: Recent Labs  Lab 11/22/20 1105 11/23/20 0322 11/23/20 2243  NA 134* 135 132*  K 4.8 4.3 4.8  CL 101 103 102  CO2 19* 17* 16*  GLUCOSE 93 161* 134*  BUN 75* 85* 103*  CREATININE 11.15* 11.68* 11.48*  CALCIUM 7.8* 7.8* 7.9*  PHOS  --   --  7.7*   Liver Function Tests: Recent Labs  Lab 11/22/20 1105 11/23/20 2243  AST 17  --   ALT 7  --   ALKPHOS 39  --   BILITOT 0.4  --   PROT 7.2  --   ALBUMIN 3.4* 3.2*   No results for input(s): LIPASE, AMYLASE in the last 168 hours. No results for input(s): AMMONIA in the last 168 hours. CBC: Recent Labs  Lab 11/22/20 1105 11/23/20 2243  WBC 4.1 5.5  NEUTROABS 3.1  --   HGB 11.7* 10.9*  HCT 36.3* 33.6*  MCV 96.5 95.7  PLT 156 169   Cardiac Enzymes: No results for input(s): CKTOTAL, CKMB, CKMBINDEX, TROPONINI in the last 168 hours. BNP: BNP (last 3 results) No results for input(s): BNP in the last 8760 hours.  ProBNP (last 3 results) No results for input(s): PROBNP in the last 8760 hours.  CBG: No results for input(s): GLUCAP in the last 168 hours.     Signed:  Nita Sells MD   [facility] 11/24/2020, 10:06 AM

## 2021-01-25 ENCOUNTER — Other Ambulatory Visit: Payer: Self-pay

## 2021-01-25 DIAGNOSIS — N186 End stage renal disease: Secondary | ICD-10-CM

## 2021-01-27 ENCOUNTER — Ambulatory Visit: Payer: Medicare Other | Admitting: Vascular Surgery

## 2021-01-27 ENCOUNTER — Inpatient Hospital Stay (HOSPITAL_COMMUNITY): Admission: RE | Admit: 2021-01-27 | Payer: Medicare Other | Source: Ambulatory Visit

## 2021-03-18 ENCOUNTER — Other Ambulatory Visit: Payer: Self-pay

## 2021-03-18 DIAGNOSIS — N186 End stage renal disease: Secondary | ICD-10-CM

## 2021-03-18 DIAGNOSIS — Z992 Dependence on renal dialysis: Secondary | ICD-10-CM

## 2021-04-05 ENCOUNTER — Encounter (HOSPITAL_COMMUNITY): Payer: 59

## 2021-04-05 ENCOUNTER — Ambulatory Visit: Payer: 59 | Admitting: Vascular Surgery

## 2021-05-30 IMAGING — DX DG CHEST 2V
2 series · 2 of 2 positions shown · non-contrast
Comparison: April 19, 2020

CLINICAL DATA: Golden shortness of breath

EXAM:
CHEST - 2 VIEW

[chest lat]
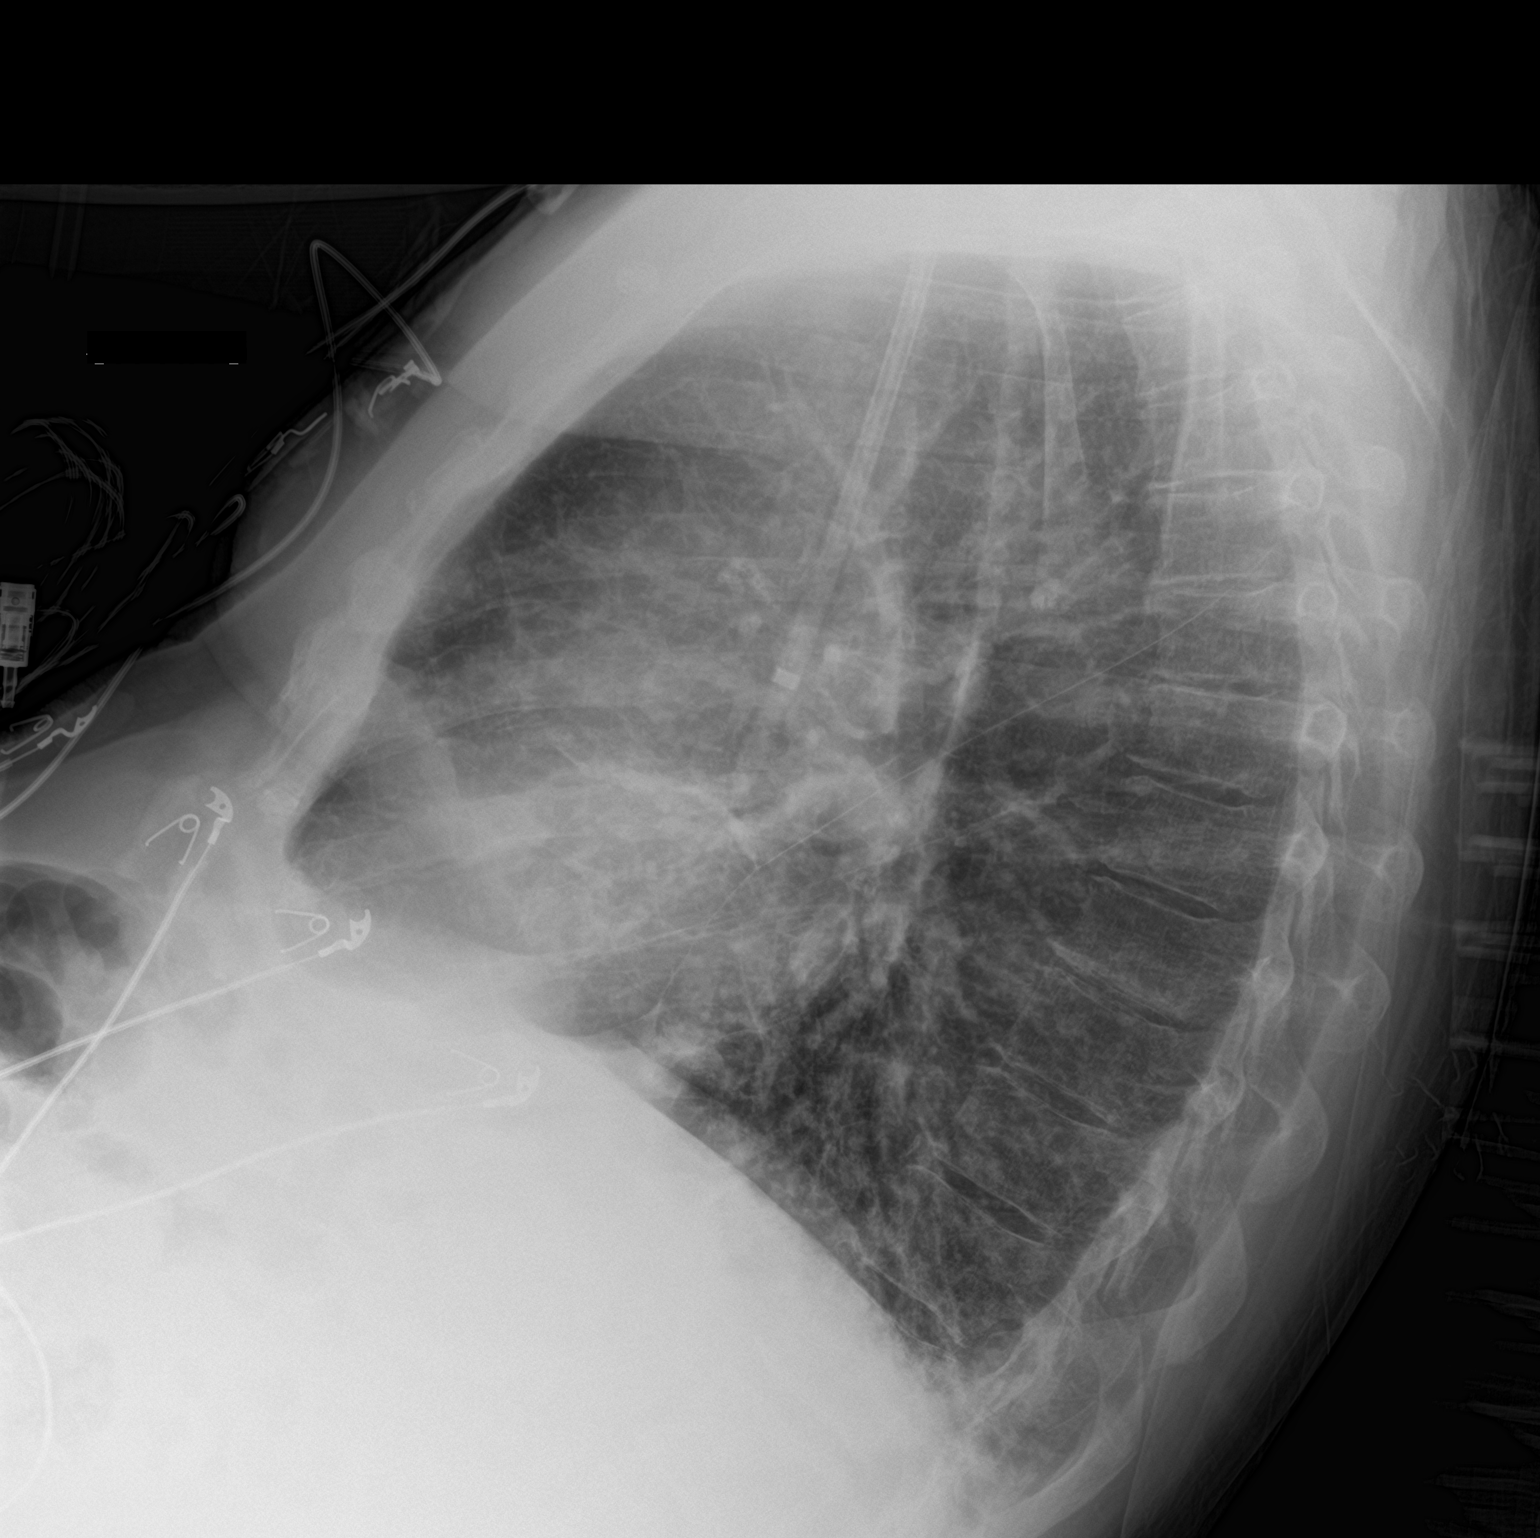

[chest ap]
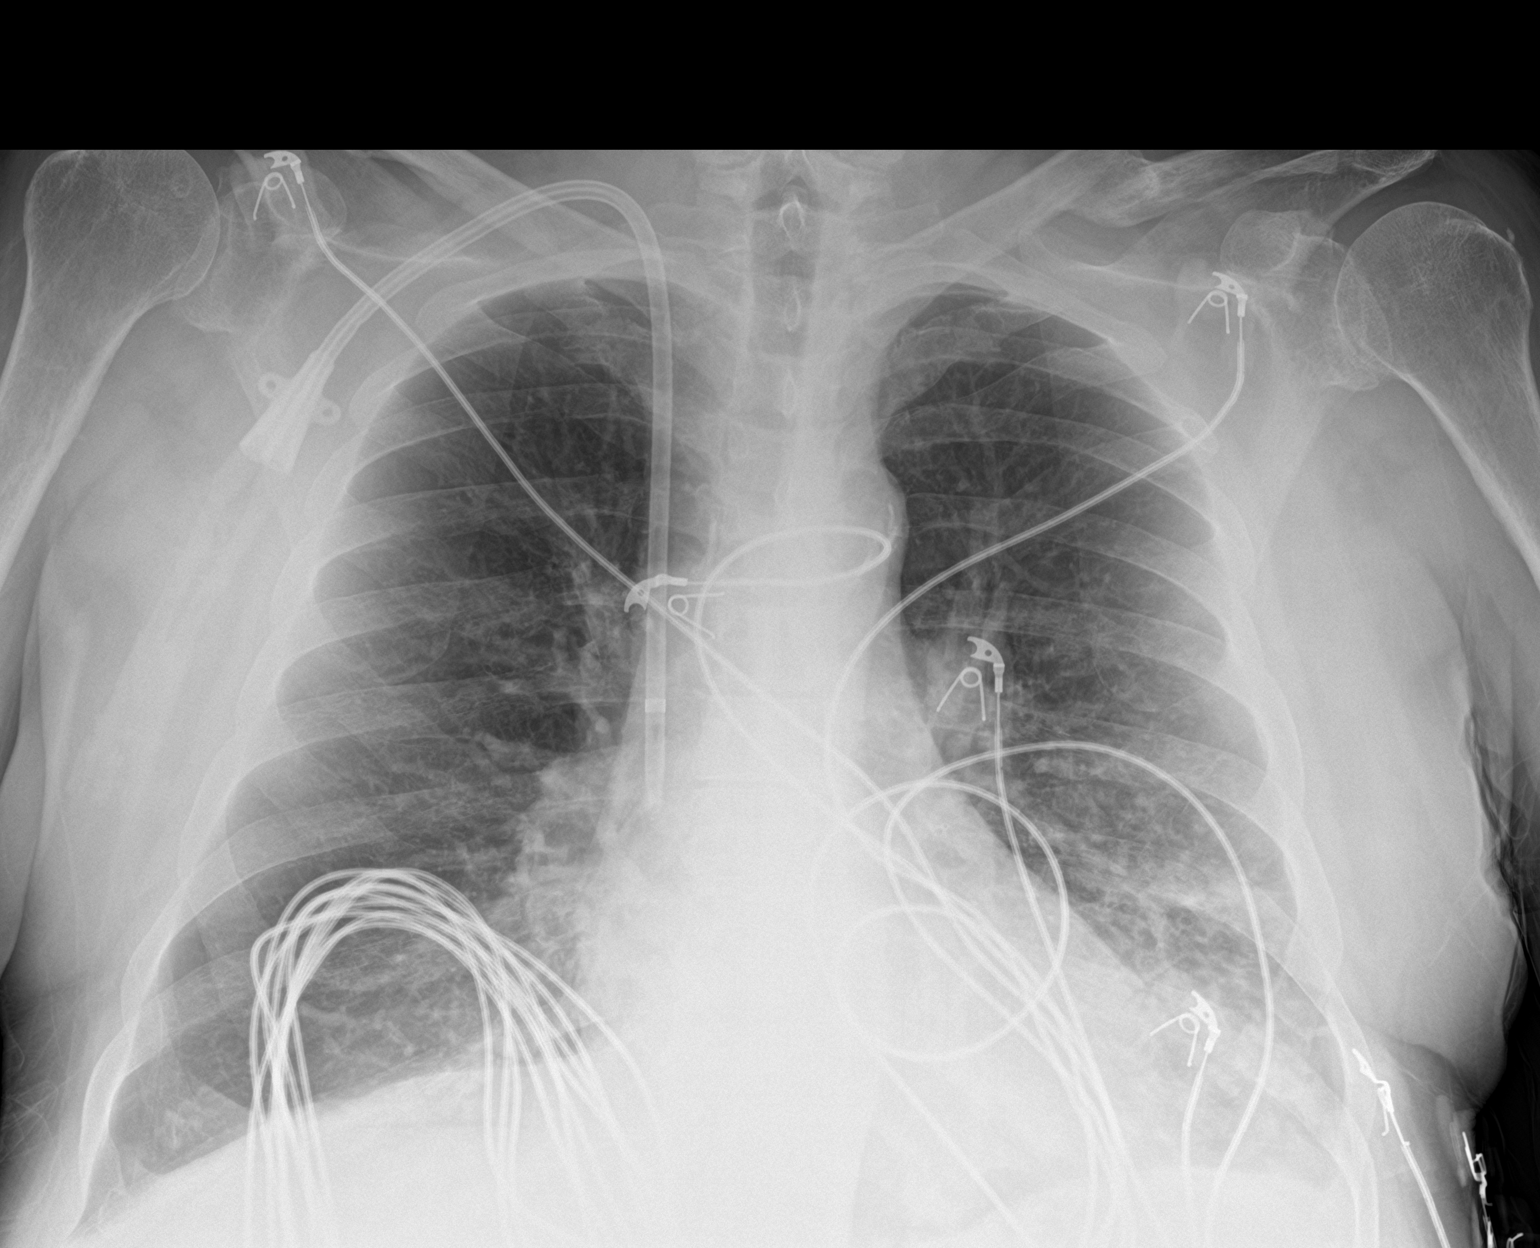

[2 of 2 positions shown; findings below may reference images not displayed]

FINDINGS: The cardiomediastinal silhouette is unchanged in contour.RIGHT chest
CVC tip terminates over the RIGHT atrium. Small LEFT pleural
effusion. No pneumothorax. New LEFT basilar heterogeneous opacities.
Atherosclerotic calcifications of the aorta. Visualized abdomen is
unremarkable. Multilevel degenerative changes of the thoracic spine.
IMPRESSION: 1. New LEFT basilar heterogeneous opacities which may reflect
atelectasis, aspiration, or infection. Followup PA and lateral chest
X-ray is recommended in 3-4 weeks following trial of antibiotic
therapy to ensure resolution and exclude underlying malignancy.
2. Small LEFT pleural effusion.

## 2021-06-29 ENCOUNTER — Encounter (HOSPITAL_COMMUNITY): Payer: Self-pay | Admitting: *Deleted

## 2021-06-29 ENCOUNTER — Inpatient Hospital Stay (HOSPITAL_COMMUNITY)
Admission: EM | Admit: 2021-06-29 | Discharge: 2021-07-02 | DRG: 190 | Disposition: A | Discharge status: Home / Self Care | Payer: 59 | Attending: Internal Medicine | Admitting: Internal Medicine

## 2021-06-29 ENCOUNTER — Other Ambulatory Visit: Payer: Self-pay

## 2021-06-29 ENCOUNTER — Emergency Department (HOSPITAL_COMMUNITY): Payer: 59

## 2021-06-29 DIAGNOSIS — J9601 Acute respiratory failure with hypoxia: Secondary | ICD-10-CM | POA: Diagnosis present

## 2021-06-29 DIAGNOSIS — Z7951 Long term (current) use of inhaled steroids: Secondary | ICD-10-CM

## 2021-06-29 DIAGNOSIS — J440 Chronic obstructive pulmonary disease with acute lower respiratory infection: Secondary | ICD-10-CM | POA: Diagnosis present

## 2021-06-29 DIAGNOSIS — I151 Hypertension secondary to other renal disorders: Secondary | ICD-10-CM

## 2021-06-29 DIAGNOSIS — J470 Bronchiectasis with acute lower respiratory infection: Secondary | ICD-10-CM | POA: Diagnosis present

## 2021-06-29 DIAGNOSIS — N400 Enlarged prostate without lower urinary tract symptoms: Secondary | ICD-10-CM | POA: Diagnosis present

## 2021-06-29 DIAGNOSIS — Z8673 Personal history of transient ischemic attack (TIA), and cerebral infarction without residual deficits: Secondary | ICD-10-CM | POA: Diagnosis not present

## 2021-06-29 DIAGNOSIS — Z820 Family history of epilepsy and other diseases of the nervous system: Secondary | ICD-10-CM

## 2021-06-29 DIAGNOSIS — N186 End stage renal disease: Secondary | ICD-10-CM | POA: Diagnosis present

## 2021-06-29 DIAGNOSIS — F17210 Nicotine dependence, cigarettes, uncomplicated: Secondary | ICD-10-CM | POA: Diagnosis present

## 2021-06-29 DIAGNOSIS — Z20822 Contact with and (suspected) exposure to covid-19: Secondary | ICD-10-CM | POA: Diagnosis present

## 2021-06-29 DIAGNOSIS — Z992 Dependence on renal dialysis: Secondary | ICD-10-CM

## 2021-06-29 DIAGNOSIS — N2889 Other specified disorders of kidney and ureter: Secondary | ICD-10-CM

## 2021-06-29 DIAGNOSIS — Z91018 Allergy to other foods: Secondary | ICD-10-CM

## 2021-06-29 DIAGNOSIS — I509 Heart failure, unspecified: Secondary | ICD-10-CM | POA: Diagnosis present

## 2021-06-29 DIAGNOSIS — Z79899 Other long term (current) drug therapy: Secondary | ICD-10-CM

## 2021-06-29 DIAGNOSIS — J189 Pneumonia, unspecified organism: Secondary | ICD-10-CM | POA: Diagnosis present

## 2021-06-29 DIAGNOSIS — Z7982 Long term (current) use of aspirin: Secondary | ICD-10-CM | POA: Diagnosis not present

## 2021-06-29 DIAGNOSIS — D631 Anemia in chronic kidney disease: Secondary | ICD-10-CM | POA: Diagnosis present

## 2021-06-29 DIAGNOSIS — N2581 Secondary hyperparathyroidism of renal origin: Secondary | ICD-10-CM | POA: Diagnosis present

## 2021-06-29 DIAGNOSIS — M898X9 Other specified disorders of bone, unspecified site: Secondary | ICD-10-CM | POA: Diagnosis present

## 2021-06-29 DIAGNOSIS — E785 Hyperlipidemia, unspecified: Secondary | ICD-10-CM | POA: Diagnosis present

## 2021-06-29 DIAGNOSIS — Z66 Do not resuscitate: Secondary | ICD-10-CM | POA: Diagnosis present

## 2021-06-29 DIAGNOSIS — J441 Chronic obstructive pulmonary disease with (acute) exacerbation: Principal | ICD-10-CM | POA: Diagnosis present

## 2021-06-29 DIAGNOSIS — R0602 Shortness of breath: Secondary | ICD-10-CM | POA: Diagnosis present

## 2021-06-29 DIAGNOSIS — I132 Hypertensive heart and chronic kidney disease with heart failure and with stage 5 chronic kidney disease, or end stage renal disease: Secondary | ICD-10-CM | POA: Diagnosis present

## 2021-06-29 DIAGNOSIS — N189 Chronic kidney disease, unspecified: Secondary | ICD-10-CM | POA: Diagnosis present

## 2021-06-29 DIAGNOSIS — R0902 Hypoxemia: Secondary | ICD-10-CM

## 2021-06-29 LAB — CBC WITH DIFFERENTIAL/PLATELET
Abs Immature Granulocytes: 0.04 10*3/uL (ref 0.00–0.07)
Basophils Absolute: 0 10*3/uL (ref 0.0–0.1)
Basophils Relative: 0 %
Eosinophils Absolute: 0.1 10*3/uL (ref 0.0–0.5)
Eosinophils Relative: 2 %
HCT: 35.1 % — ABNORMAL LOW (ref 39.0–52.0)
Hemoglobin: 11.8 g/dL — ABNORMAL LOW (ref 13.0–17.0)
Immature Granulocytes: 1 %
Lymphocytes Relative: 14 %
Lymphs Abs: 1.1 10*3/uL (ref 0.7–4.0)
MCH: 34.1 pg — ABNORMAL HIGH (ref 26.0–34.0)
MCHC: 33.6 g/dL (ref 30.0–36.0)
MCV: 101.4 fL — ABNORMAL HIGH (ref 80.0–100.0)
Monocytes Absolute: 0.8 10*3/uL (ref 0.1–1.0)
Monocytes Relative: 11 %
Neutro Abs: 5.6 10*3/uL (ref 1.7–7.7)
Neutrophils Relative %: 72 %
Platelets: 171 10*3/uL (ref 150–400)
RBC: 3.46 MIL/uL — ABNORMAL LOW (ref 4.22–5.81)
RDW: 16.6 % — ABNORMAL HIGH (ref 11.5–15.5)
WBC: 7.6 10*3/uL (ref 4.0–10.5)
nRBC: 0 % (ref 0.0–0.2)

## 2021-06-29 LAB — COMPREHENSIVE METABOLIC PANEL
ALT: 20 U/L (ref 0–44)
AST: 39 U/L (ref 15–41)
Albumin: 3.5 g/dL (ref 3.5–5.0)
Alkaline Phosphatase: 65 U/L (ref 38–126)
Anion gap: 13 (ref 5–15)
BUN: 31 mg/dL — ABNORMAL HIGH (ref 8–23)
CO2: 27 mmol/L (ref 22–32)
Calcium: 8.1 mg/dL — ABNORMAL LOW (ref 8.9–10.3)
Chloride: 93 mmol/L — ABNORMAL LOW (ref 98–111)
Creatinine, Ser: 6.79 mg/dL — ABNORMAL HIGH (ref 0.61–1.24)
GFR, Estimated: 8 mL/min — ABNORMAL LOW (ref >60)
Glucose, Bld: 111 mg/dL — ABNORMAL HIGH (ref 70–99)
Potassium: 3.7 mmol/L (ref 3.5–5.1)
Sodium: 133 mmol/L — ABNORMAL LOW (ref 135–145)
Total Bilirubin: 0.6 mg/dL (ref 0.3–1.2)
Total Protein: 8.2 g/dL — ABNORMAL HIGH (ref 6.5–8.1)

## 2021-06-29 LAB — LACTIC ACID, PLASMA
Lactic Acid, Venous: 1.5 mmol/L (ref 0.5–1.9)
Lactic Acid, Venous: 1.9 mmol/L (ref 0.5–1.9)

## 2021-06-29 LAB — RESP PANEL BY RT-PCR (FLU A&B, COVID) ARPGX2
Influenza A by PCR: NEGATIVE
Influenza B by PCR: NEGATIVE
SARS Coronavirus 2 by RT PCR: NEGATIVE

## 2021-06-29 LAB — BRAIN NATRIURETIC PEPTIDE: B Natriuretic Peptide: 287 pg/mL — ABNORMAL HIGH (ref 0.0–100.0)

## 2021-06-29 LAB — BLOOD GAS, VENOUS
Acid-Base Excess: 1.4 mmol/L (ref 0.0–2.0)
Bicarbonate: 25.2 mmol/L (ref 20.0–28.0)
FIO2: 21
O2 Saturation: 72.7 %
Patient temperature: 37.6
pCO2, Ven: 41 mmHg — ABNORMAL LOW (ref 44.0–60.0)
pH, Ven: 7.412 (ref 7.250–7.430)
pO2, Ven: 39.9 mmHg (ref 32.0–45.0)

## 2021-06-29 LAB — GLUCOSE, CAPILLARY: Glucose-Capillary: 147 mg/dL — ABNORMAL HIGH (ref 70–99)

## 2021-06-29 LAB — PHOSPHORUS: Phosphorus: 3.7 mg/dL (ref 2.5–4.6)

## 2021-06-29 LAB — MAGNESIUM: Magnesium: 2 mg/dL (ref 1.7–2.4)

## 2021-06-29 MED ORDER — ALBUTEROL SULFATE (2.5 MG/3ML) 0.083% IN NEBU
10.0000 mg | INHALATION_SOLUTION | Freq: Once | RESPIRATORY_TRACT | Status: AC
  Administered 2021-06-29: 10 mg via RESPIRATORY_TRACT

## 2021-06-29 MED ORDER — ACETAMINOPHEN 325 MG PO TABS: 650.0000 mg | ORAL_TABLET | Freq: Four times a day (QID) | ORAL | Status: DC | PRN

## 2021-06-29 MED ORDER — GUAIFENESIN-DM 100-10 MG/5ML PO SYRP
5.0000 mL | ORAL_SOLUTION | ORAL | Status: DC | PRN
  Administered 2021-06-30 – 2021-07-01 (×3): 5 mL via ORAL
  Filled 2021-06-29 (×3): qty 5

## 2021-06-29 MED ORDER — TAMSULOSIN HCL 0.4 MG PO CAPS
0.4000 mg | ORAL_CAPSULE | Freq: Every day | ORAL | Status: DC
  Administered 2021-06-29 – 2021-07-01 (×3): 0.4 mg via ORAL
  Filled 2021-06-29 (×3): qty 1

## 2021-06-29 MED ORDER — ONDANSETRON HCL 4 MG/2ML IJ SOLN: 4.0000 mg | Freq: Four times a day (QID) | INTRAMUSCULAR | Status: DC | PRN

## 2021-06-29 MED ORDER — ALBUTEROL SULFATE (2.5 MG/3ML) 0.083% IN NEBU
INHALATION_SOLUTION | RESPIRATORY_TRACT | Status: AC
  Filled 2021-06-29: qty 12

## 2021-06-29 MED ORDER — GABAPENTIN 300 MG PO CAPS
300.0000 mg | ORAL_CAPSULE | Freq: Once | ORAL | Status: AC
  Administered 2021-06-29: 300 mg via ORAL
  Filled 2021-06-29: qty 1

## 2021-06-29 MED ORDER — SODIUM CHLORIDE 0.9 % IV SOLN
500.0000 mg | INTRAVENOUS | Status: DC
  Administered 2021-06-29 – 2021-07-01 (×3): 500 mg via INTRAVENOUS
  Filled 2021-06-29 (×4): qty 500

## 2021-06-29 MED ORDER — ACETAMINOPHEN 650 MG RE SUPP: 650.0000 mg | Freq: Four times a day (QID) | RECTAL | Status: DC | PRN

## 2021-06-29 MED ORDER — HEPARIN SODIUM (PORCINE) 5000 UNIT/ML IJ SOLN
5000.0000 [IU] | Freq: Three times a day (TID) | INTRAMUSCULAR | Status: DC
  Administered 2021-06-29 – 2021-07-02 (×9): 5000 [IU] via SUBCUTANEOUS
  Filled 2021-06-29 (×9): qty 1

## 2021-06-29 MED ORDER — SODIUM CHLORIDE 0.9 % IV SOLN
500.0000 mg | Freq: Once | INTRAVENOUS | Status: AC
  Administered 2021-06-29: 500 mg via INTRAVENOUS
  Filled 2021-06-29: qty 500

## 2021-06-29 MED ORDER — METHYLPREDNISOLONE SODIUM SUCC 125 MG IJ SOLR
125.0000 mg | Freq: Once | INTRAMUSCULAR | Status: AC
  Administered 2021-06-29: 125 mg via INTRAVENOUS
  Filled 2021-06-29: qty 2

## 2021-06-29 MED ORDER — IPRATROPIUM BROMIDE 0.02 % IN SOLN: 0.5000 mg | Freq: Once | RESPIRATORY_TRACT | Status: DC

## 2021-06-29 MED ORDER — ASPIRIN EC 325 MG PO TBEC
325.0000 mg | DELAYED_RELEASE_TABLET | Freq: Every day | ORAL | Status: DC
  Administered 2021-06-29 – 2021-07-02 (×4): 325 mg via ORAL
  Filled 2021-06-29 (×4): qty 1

## 2021-06-29 MED ORDER — SODIUM CHLORIDE 0.9 % IV SOLN
1.0000 g | Freq: Once | INTRAVENOUS | Status: AC
  Administered 2021-06-29: 1 g via INTRAVENOUS
  Filled 2021-06-29: qty 10

## 2021-06-29 MED ORDER — METOPROLOL TARTRATE 25 MG PO TABS
25.0000 mg | ORAL_TABLET | Freq: Two times a day (BID) | ORAL | Status: DC
  Administered 2021-06-29 – 2021-07-02 (×6): 25 mg via ORAL
  Filled 2021-06-29 (×6): qty 1

## 2021-06-29 MED ORDER — ALBUTEROL (5 MG/ML) CONTINUOUS INHALATION SOLN
20.0000 mg/h | INHALATION_SOLUTION | Freq: Once | RESPIRATORY_TRACT | Status: DC
Start: 2021-06-29 — End: 2021-07-02
  Filled 2021-06-29: qty 20

## 2021-06-29 MED ORDER — ONDANSETRON HCL 4 MG PO TABS: 4.0000 mg | ORAL_TABLET | Freq: Four times a day (QID) | ORAL | Status: DC | PRN

## 2021-06-29 NOTE — H&P (Signed)
Insert History and Physical    Kerry Underwood [id number] DOB: 04-12-1951 DOA: 06/29/2021  PCP: [name], FNP   Patient coming from: Home/dialysis center.  I have personally briefly reviewed patient's old medical records in [place]  Chief Complaint: Shortness of breath, increased wheezing and productive coughing spells.  HPI: Kerry Underwood is a 70 y.o. male with medical history significant of ongoing tobacco abuse, renal disease, hypertension, history of TIA/stroke, COPD and anemia of chronic kidney disease; who presented to the emergency department from his dialysis center secondary to worsening shortness of breath and productive coughing spells.  Patient expressed some chills but had not checked his temperature at home.  No bloody sputum seen.  Denies chest pain.  Symptoms has been present for the last 3-4 days and worsening.  On 06/27/2021 he could not go to his dialysis as previously scheduled due to how bad he was feeling especially from the breathing standpoint with minimal exertion.  Patient found to experience difficulty speaking in full sentences and diffuse wheezing appreciated. No nausea, no vomiting, no focal deficits or weakness.  Patient denies abdominal pain.  Of note, patient is now vaccinated against COVID and is not interested to be vaccinated.   ED Course: Chest x-ray demonstrating right lower lobe infiltrate/opacity, patient found to be febrile with tachypnea, difficulty speaking in full sentences and hypoxia.  3-4 L nasal cannula supplementation in place to maintain O2 sat above 90%.  Patient with response to bronchodilator management while in the ED.  TRH has been called to place patient in the hospital for acute respiratory failure with hypoxia due to COPD exacerbation and pneumonia.  Review of Systems: As per HPI otherwise all other systems reviewed and are negative.   Past Medical History:  Diagnosis Date   Anemia    CHF (congestive heart failure)  (HCC)    COPD (chronic obstructive pulmonary disease) ([place])    Full dentures    Hypertension    Pyelonephritis 09/11/2017   Renal disorder    Stroke [facility])    " A few mini strokes, I didn't know it "    Past Surgical History:  Procedure Laterality Date   BASCILIC VEIN TRANSPOSITION Right 04/19/2020   Procedure: Basilic Vein Transposition Right Arm;  Surgeon: Waynetta Sandy, MD;  Location: [place];  Service: Vascular;  Laterality: Right;   INSERTION OF DIALYSIS CATHETER Right 04/19/2020   Procedure: Insertion Of Right Internal Jugular Dialysis Catheter;  Surgeon: Waynetta Sandy, MD;  Location: [place];  Service: Vascular;  Laterality: Right;   MULTIPLE TOOTH EXTRACTIONS      Social History  reports that he has been smoking cigarettes. He has been smoking an average of .5 packs per day. He has quit using smokeless tobacco.  His smokeless tobacco use included chew. He reports previous drug use. Drug: Marijuana. He reports that he does not drink alcohol.  Allergies  Allergen Reactions   Strawberry (Diagnostic)     Family History  Problem Relation Age of Onset   Alzheimer's disease Mother     Prior to Admission medications   Medication Sig Start Date End Date Taking? Authorizing Provider  albuterol (VENTOLIN HFA) 108 (90 Base) MCG/ACT inhaler Inhale 2 puffs into the lungs every 6 (six) hours as needed for wheezing or shortness of breath. 04/20/20 06/29/21 Yes Alma Friendly, MD  aspirin EC 325 MG tablet Take 325 mg by mouth daily.   Yes [provider]  B Complex-C-Folic Acid (DIALYVITE Q000111Q) 0.8  MG TABS Take 1 tablet by mouth at bedtime. 06/20/21  Yes [provider]  Methoxy PEG-Epoetin Beta (MIRCERA IJ) Mircera 02/21/21 02/20/22 Yes [provider]  metoprolol tartrate (LOPRESSOR) 25 MG tablet Take 1 tablet (25 mg total) by mouth 2 (two) times daily. 04/20/20 06/29/21 Yes Alma Friendly, MD  tamsulosin (FLOMAX) 0.4 MG CAPS capsule Take  0.4 mg by mouth at bedtime. 05/24/20  Yes [provider]  calcium carbonate (OS-CAL - DOSED IN MG OF ELEMENTAL CALCIUM) 1250 (500 Ca) MG tablet Take 1 tablet (500 mg of elemental calcium total) by mouth at bedtime. Patient not taking: Reported on 06/29/2021 04/20/20 09/17/20  Alma Friendly, MD  tiotropium (SPIRIVA HANDIHALER) 18 MCG inhalation capsule Place 1 capsule (18 mcg total) into inhaler and inhale daily. 04/20/20 09/17/20  Alma Friendly, MD  amLODipine (NORVASC) 5 MG tablet Take 1 tablet (5 mg total) by mouth daily. Patient not taking: No sig reported 04/21/20 06/29/21  Alma Friendly, MD    Physical Exam: Vitals:   06/29/21 1400 06/29/21 1405 06/29/21 1429 06/29/21 1518  BP: 106/63  117/69   Pulse: 94 95 100   Resp: (!) 21 17 (!) 21   Temp:   98.4 F (36.9 C)   TempSrc:   Oral   SpO2:  92% 98% 98%  Weight:      Height:   '5\' 5"'$  (1.651 m)     Constitutional: Afebrile, in mild distress secondary to shortness of breath, productive cough and spells and difficulty speaking in full sentences.  Patient denies chest pain. Vitals:   06/29/21 1400 06/29/21 1405 06/29/21 1429 06/29/21 1518  BP: 106/63  117/69   Pulse: 94 95 100   Resp: (!) 21 17 (!) 21   Temp:   98.4 F (36.9 C)   TempSrc:   Oral   SpO2:  92% 98% 98%  Weight:      Height:   '5\' 5"'$  (1.651 m)    Eyes: PERRL, lids and conjunctivae normal, no icterus, no nystagmus. ENMT: Mucous membranes are moist. Posterior pharynx clear of any exudate or lesions. Neck: normal, supple, no masses, no thyromegaly Respiratory: Positive rhonchi bilaterally; diffuse expiratory wheezing.  Positive tachypnea.  No using accessory muscles. Cardiovascular: Regular rate and rhythm, no rubs, no gallops, no JVD on exam. Abdomen: no tenderness, no masses palpated. No hepatosplenomegaly. Bowel sounds positive.  Musculoskeletal: no clubbing / cyanosis.  Trace edema appreciated bilaterally; no joint deformity.  Normal muscle  tone. Skin: no rashes, no petechiae. Neurologic: CN 2-12 grossly intact.  No focal deficit. Psychiatric: Normal judgment and insight. Alert and oriented x 3. Normal mood.    Labs on Admission: I have personally reviewed following labs and imaging studies  CBC: Recent Labs  Lab 06/29/21 1012  WBC 7.6  NEUTROABS 5.6  HGB 11.8*  HCT 35.1*  MCV 101.4*  PLT [id number]    Basic Metabolic Panel: Recent Labs  Lab 06/29/21 1012 06/29/21 1014  NA 133*  --   K 3.7  --   CL 93*  --   CO2 27  --   GLUCOSE 111*  --   BUN 31*  --   CREATININE 6.79*  --   CALCIUM 8.1*  --   MG  --  2.0  PHOS  --  3.7    GFR: Estimated Creatinine Clearance: 9.6 mL/min (A) (by C-G formula based on SCr of 6.79 mg/dL (H)).  Liver Function Tests: Recent Labs  Lab 06/29/21 1012  AST 39  ALT 20  ALKPHOS 65  BILITOT 0.6  PROT 8.2*  ALBUMIN 3.5    Urine analysis:    Component Value Date/Time   COLORURINE YELLOW 04/15/2020 2200   APPEARANCEUR TURBID (A) 04/15/2020 2200   LABSPEC 1.010 04/15/2020 2200   PHURINE 6.0 04/15/2020 2200   GLUCOSEU NEGATIVE 04/15/2020 2200   HGBUR SMALL (A) 04/15/2020 2200   BILIRUBINUR NEGATIVE 04/15/2020 2200   KETONESUR NEGATIVE 04/15/2020 2200   PROTEINUR 100 (A) 04/15/2020 2200   NITRITE POSITIVE (A) 04/15/2020 2200   LEUKOCYTESUR LARGE (A) 04/15/2020 2200    Radiological Exams on Admission: DG Chest Portable 1 View  Result Date: 06/29/2021 CLINICAL DATA:  Shortness of breath. EXAM: PORTABLE CHEST 1 VIEW COMPARISON:  11/22/2020 FINDINGS: The lungs are clear without focal pneumonia, edema, pneumothorax or pleural effusion. Similar patchy opacity at the left base is substantially decreased since chest x-ray of 09/17/2020 and may reflect chronic atelectasis or scarring. Infection not entirely excluded but considered less likely. The cardiopericardial silhouette is within normal limits for size. Right IJ central line tip overlies the SVC/RA junction. Old left clavicle  fracture again noted. Telemetry leads overlie the chest. IMPRESSION: Persistent patchy opacity at the left base, likely chronic although component of airspace infection cannot be excluded. Electronically Signed   By: Misty Stanley M.D.   On: 06/29/2021 11:19    EKG: Independently reviewed.  No acute ischemic changes.  Assessment/Plan 1-acute respiratory failure with hypoxia secondary to COPD with acute exacerbation and community-acquired pneumonia/bronchiectasis ([place]) -Patient will be admitted to the hospital for nebulizer management, antibiotics, flutter valve, mucolytic/antitussive medications and the use of his steroids. -Oxygen supplementation will be provided and wean off as tolerated. -Follow culture results and clinical response. -Rocephin and Zithromax has been used as part of treatment for community-acquired pneumonia.  Pneumonia protocol has been used.  2-Hypertension secondary to other renal disorders -Resume antihypertensive agents and follow vital signs -Heart healthy/renal diet encouraged.  3-Anemia due to chronic kidney disease -No operative bleeding -Hemoglobin stable -IV iron and Epogen therapy as per nephrology discretion.  4-ESRD (end stage renal disease) [facility]) -Nephrology service has been consulted for resumption of hemodialysis while hospitalized. -Patient usually receives treatments on Tuesday, Thursday and Saturday; he missed dialysis on 06/27/2021 and he was not feeling good.  Received almost a complete treatment on the day of admission prior to be transferred from dialysis center to the emergency department. -Will follow renal service recommendations.  5-BPH -Continue Flomax.  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 -Continue risk factor modifications -Continue full dose aspirin for secondary prevention.  7-tobacco abuse -Cessation counseling provided -Patient  declined nicotine patch therapy.  DVT prophylaxis: Heparin Code Status:   DNR Family Communication:  No family at bedside. Disposition Plan:   Patient is from:  Home  Anticipated DC to:  Home  Anticipated DC date: To be determined.  Anticipated DC barriers: Stabilization of patient's breathing  Consults called:  Nephrology service (Dr. Hollie Salk). Admission status:  Inpatient, MedSurg, length of stay more than 2 midnights.   Severity of Illness: The appropriate patient status for this patient is INPATIENT. Inpatient status is judged to be reasonable and necessary in order to provide the required intensity of service to ensure the patient's safety. The patient's presenting symptoms, physical exam findings, and initial radiographic and laboratory data in the context of their chronic comorbidities is felt to place them at high risk  for further clinical deterioration. Furthermore, it is not anticipated that the patient will be medically stable for discharge from the hospital within 2 midnights of admission. The following factors support the patient status of inpatient.   " The patient's presenting symptoms include acute respiratory failure with hypoxia, fever, productive coughing spells and shortness of breath.. " The worrisome physical exam findings include diffuse expiratory wheezing, inability to speak in full sentences, tachypnea and requirement of oxygen supplementation to keep O2 sat above 89-90%.. " The initial radiographic and laboratory data are worrisome because of infiltrates/opacities since on x-ray. " The chronic co-morbidities include COPD, ESRD and hypertension.   * I certify that at the point of admission it is my clinical judgment that the patient will require inpatient hospital care spanning beyond 2 midnights from the point of admission due to high intensity of service, high risk for further deterioration and high frequency of surveillance required.Barton [facility]ois MD [facility]  How to contact the [facility] Attending or Consulting provider [place] or covering provider during after hours [place], for this patient?   Check the care team in [facility] and look for a) attending/consulting TRH provider listed and b) the [facility] team listed Log into [email or web address] and use [place]'s universal password to access. If you do not have the password, please contact the hospital operator. Locate the [facility] provider you are looking for under [facility] and page to a number that you can be directly reached. If you still have difficulty reaching the provider, please page the [facility] (Director on Call) for the Hospitalists listed on amion for assistance.  06/29/2021, 5:59 PM

## 2021-06-29 NOTE — ED Notes (Signed)
Provider in room, requesting hour long neb tx-RT notified

## 2021-06-29 NOTE — ED Notes (Signed)
Report called to [employer], pt being transported to 318,

## 2021-06-29 NOTE — Progress Notes (Signed)
Pt arrived to room 318 via stretcher from ED. Pt ambulatory from stretcher to bed without assistance, tolerated well, some increased resp with exertion. Congested cough, non-productive. Pt with right foot pain and numbness, right foot noted to be discolored and cool to touch. Pt states has been this way for a week, feels like its tingling and sore to touch. Pedal, DP pulses both weak on right foot. Left foot warm to touch, color WNL, pulse easily palpated. Pt with dialysis fistula in right arm, states is not used at this time. Pt has inserted dialysis cath in right upper chest, has been present for one year. DDI. Oriented to room and safety procedures, states understanding. Pt is a smoker, denies need for nicotine patches, states, "I'll tough it out."

## 2021-06-29 NOTE — ED Provider Notes (Signed)
[facility] Provider Note   CSN: [id number] Arrival date & time: 06/29/21  0941     History Chief Complaint  Patient presents with   Shortness of Breath    Kerry Underwood is a 70 y.o. male.  Patient with history of CHF, COPD, renal disease, stroke presents with worsening shortness of breath for the past 2 days.  Patient is at dialysis with worsening wheezing and difficulty breathing and sent over.  Patient has had cough congestion for a week.  Patient missed dialysis Tuesday due to symptoms.  Patient only had half his dialysis session today.  No fevers documented.  Patient denies blood clot history.      Past Medical History:  Diagnosis Date   Anemia    CHF (congestive heart failure) (HCC)    COPD (chronic obstructive pulmonary disease) ([place])    Full dentures    Hypertension    Pyelonephritis 09/11/2017   Renal disorder    Stroke [facility])    " A few mini strokes, I didn't know it "    Patient Active Problem List   Diagnosis Date Noted   COPD (chronic obstructive pulmonary disease) ([place]) 11/23/2020   Influenza A Infection 11/22/2020   COVID-19 11/17/2020   CAP (community acquired pneumonia) 09/17/2020   COPD with acute exacerbation ([place]) 09/17/2020   Allergy, unspecified, initial encounter 06/29/2020   Anaphylactic shock, unspecified, initial encounter 06/29/2020   ESRD (end stage renal disease) ([place]) 05/11/2020   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   Unspecified protein-calorie malnutrition ([place]) 04/25/2020   Acute on chronic diastolic (congestive) heart failure ([place]) 04/20/2020   Coagulation defect, unspecified ([place]) 04/20/2020   Diarrhea, unspecified 04/20/2020   Encounter for immunization 04/20/2020   Hypertensive chronic kidney disease with stage 1 through stage 4 chronic kidney disease, or unspecified chronic kidney disease 04/20/2020   Hypothyroidism, unspecified 04/20/2020   Iron deficiency anemia, unspecified 04/20/2020   Pain,  unspecified 04/20/2020   Pruritus, unspecified 04/20/2020   Secondary hyperparathyroidism of renal origin ([place]) 04/20/2020   Anemia due to chronic kidney disease 04/15/2020   Hyperkalemia 04/15/2020   Hypocalcemia 04/15/2020   Benign prostatic hyperplasia with urinary retention    Chronic obstructive pulmonary disease ([place])    Hypertension secondary to other renal disorders     Past Surgical History:  Procedure Laterality Date   BASCILIC VEIN TRANSPOSITION Right 04/19/2020   Procedure: Basilic Vein Transposition Right Arm;  Surgeon: Waynetta Sandy, MD;  Location: [place];  Service: Vascular;  Laterality: Right;   INSERTION OF DIALYSIS CATHETER Right 04/19/2020   Procedure: Insertion Of Right Internal Jugular Dialysis Catheter;  Surgeon: Waynetta Sandy, MD;  Location: [facility] OR;  Service: Vascular;  Laterality: Right;   MULTIPLE TOOTH EXTRACTIONS         Family History  Problem Relation Age of Onset   Alzheimer's disease Mother     Social History   Tobacco Use   Smoking status: Every Day    Packs/day: 0.50    Types: Cigarettes   Smokeless tobacco: Former    Types: Nurse, children's Use: Never used  Substance Use Topics   Alcohol use: No   Drug use: Not Currently    Types: Marijuana    Home Medications Prior to Admission medications   Medication Sig Start Date End Date Taking? Authorizing Provider  albuterol (VENTOLIN HFA) 108 (90 Base) MCG/ACT inhaler Inhale 2 puffs into the lungs every 6 (six) hours as needed for wheezing  or shortness of breath. 04/20/20 09/17/20  Alma Friendly, MD  amLODipine (NORVASC) 5 MG tablet Take 1 tablet (5 mg total) by mouth daily. 04/21/20 09/17/20  Alma Friendly, MD  aspirin EC 325 MG tablet Take 325 mg by mouth daily.    [provider]  calcium carbonate (OS-CAL - DOSED IN MG OF ELEMENTAL CALCIUM) 1250 (500 Ca) MG tablet Take 1 tablet (500 mg of elemental calcium total) by mouth at bedtime. 04/20/20  09/17/20  Alma Friendly, MD  metoprolol tartrate (LOPRESSOR) 25 MG tablet Take 1 tablet (25 mg total) by mouth 2 (two) times daily. 04/20/20 09/17/20  Alma Friendly, MD  predniSONE (DELTASONE) 20 MG tablet Take 3 tablets (60 mg total) by mouth daily before breakfast. 11/25/20   Nita Sells, MD  tamsulosin (FLOMAX) 0.4 MG CAPS capsule Take 0.4 mg by mouth at bedtime. 05/24/20   [provider]  tiotropium (SPIRIVA HANDIHALER) 18 MCG inhalation capsule Place 1 capsule (18 mcg total) into inhaler and inhale daily. 04/20/20 09/17/20  Alma Friendly, MD  VELPHORO 500 MG chewable tablet Chew 1,000 mg by mouth 3 (three) times daily. 11/08/20   [provider]    Allergies    Strawberry (diagnostic)  Review of Systems   Review of Systems  Constitutional:  Positive for fatigue. Negative for chills and fever.  HENT:  Negative for congestion.   Eyes:  Negative for visual disturbance.  Respiratory:  Positive for cough and shortness of breath.   Cardiovascular:  Negative for chest pain and leg swelling.  Gastrointestinal:  Negative for abdominal pain and vomiting.  Genitourinary:  Negative for dysuria and flank pain.  Musculoskeletal:  Negative for back pain, neck pain and neck stiffness.  Skin:  Negative for rash.  Neurological:  Negative for light-headedness and headaches.   Physical Exam Updated Vital Signs BP 125/67   Pulse (!) 109   Temp 99.7 F (37.6 C) (Oral)   Resp (!) 30   Ht '5\' 5"'$  (1.651 m)   Wt 77.1 kg   SpO2 100%   BMI 28.29 kg/m   Physical Exam Vitals and nursing note reviewed.  Constitutional:      General: He is not in acute distress.    Appearance: He is well-developed.  HENT:     Head: Normocephalic and atraumatic.     Mouth/Throat:     Mouth: Mucous membranes are moist.  Eyes:     General:        Right eye: No discharge.        Left eye: No discharge.     Conjunctiva/sclera: Conjunctivae normal.  Neck:     Trachea: No  tracheal deviation.  Cardiovascular:     Rate and Rhythm: Regular rhythm. Tachycardia present.     Heart sounds: No murmur heard. Pulmonary:     Effort: Pulmonary effort is normal.     Breath sounds: Decreased breath sounds, wheezing and rhonchi present.  Abdominal:     General: There is no distension.     Palpations: Abdomen is soft.     Tenderness: There is no abdominal tenderness. There is no guarding.  Musculoskeletal:     Cervical back: Normal range of motion and neck supple. No rigidity.     Right lower leg: No edema.     Left lower leg: No edema.  Skin:    General: Skin is warm.     Capillary Refill: Capillary refill takes less than 2 seconds.  Findings: No rash.  Neurological:     General: No focal deficit present.     Mental Status: He is alert.     Cranial Nerves: No cranial nerve deficit.  Psychiatric:        Mood and Affect: Mood normal.    ED Results / Procedures / Treatments   Labs (all labs ordered are listed, but only abnormal results are displayed) Labs Reviewed  COMPREHENSIVE METABOLIC PANEL - Abnormal; Notable for the following components:      Result Value   Sodium 133 (*)    Chloride 93 (*)    Glucose, Bld 111 (*)    BUN 31 (*)    Creatinine, Ser 6.79 (*)    Calcium 8.1 (*)    Total Protein 8.2 (*)    GFR, Estimated 8 (*)    All other components within normal limits  CBC WITH DIFFERENTIAL/PLATELET - Abnormal; Notable for the following components:   RBC 3.46 (*)    Hemoglobin 11.8 (*)    HCT 35.1 (*)    MCV 101.4 (*)    MCH 34.1 (*)    RDW 16.6 (*)    All other components within normal limits  BLOOD GAS, VENOUS - Abnormal; Notable for the following components:   pCO2, Ven 41.0 (*)    All other components within normal limits  BRAIN NATRIURETIC PEPTIDE - Abnormal; Notable for the following components:   B Natriuretic Peptide 287.0 (*)    All other components within normal limits  RESP PANEL BY RT-PCR (FLU A&B, COVID) ARPGX2  CULTURE,  BLOOD (ROUTINE X 2)  CULTURE, BLOOD (ROUTINE X 2)  LACTIC ACID, PLASMA  LACTIC ACID, PLASMA    EKG EKG Interpretation  Date/Time:  Thursday June 29 2021 09:58:36 EDT Ventricular Rate:  114 PR Interval:  143 QRS Duration: 95 QT Interval:  336 QTC Calculation: 463 R Axis:   30 Text Interpretation: Sinus tachycardia Multiple ventricular premature complexes Minimal ST depression, diffuse leads Confirmed by Elnora Morrison [phone]) on 06/29/2021 10:15:59 AM  Radiology DG Chest Portable 1 View  Result Date: 06/29/2021 CLINICAL DATA:  Shortness of breath. EXAM: PORTABLE CHEST 1 VIEW COMPARISON:  11/22/2020 FINDINGS: The lungs are clear without focal pneumonia, edema, pneumothorax or pleural effusion. Similar patchy opacity at the left base is substantially decreased since chest x-ray of 09/17/2020 and may reflect chronic atelectasis or scarring. Infection not entirely excluded but considered less likely. The cardiopericardial silhouette is within normal limits for size. Right IJ central line tip overlies the SVC/RA junction. Old left clavicle fracture again noted. Telemetry leads overlie the chest. IMPRESSION: Persistent patchy opacity at the left base, likely chronic although component of airspace infection cannot be excluded. Electronically Signed   By: Misty Stanley M.D.   On: 06/29/2021 11:19    Procedures .Critical Care  Date/Time: 06/29/2021 11:29 AM Performed by: Elnora Morrison, MD Authorized by: Elnora Morrison, MD   Critical care provider statement:    Critical care time (minutes):  40   Critical care start time:  06/29/2021 10:30 AM   Critical care end time:  06/29/2021 11:10 AM   Critical care time was exclusive of:  Separately billable procedures and treating other patients and teaching time   Critical care was necessary to treat or prevent imminent or life-threatening deterioration of the following conditions:  Respiratory failure   Critical care was time spent personally by me on  the following activities:  Discussions with consultants, evaluation of patient's response to treatment, examination of  patient, ordering and performing treatments and interventions, ordering and review of laboratory studies, ordering and review of radiographic studies, pulse oximetry, re-evaluation of patient's condition, obtaining history from patient or surrogate and review of old charts   Medications Ordered in ED Medications  albuterol (PROVENTIL) (2.5 MG/3ML) 0.083% nebulizer solution (  Not Given 06/29/21 1114)  cefTRIAXone (ROCEPHIN) 1 g in sodium chloride 0.9 % 100 mL IVPB (has no administration in time range)  albuterol (PROVENTIL,VENTOLIN) solution continuous neb (has no administration in time range)  ipratropium (ATROVENT) nebulizer solution 0.5 mg (has no administration in time range)  methylPREDNISolone sodium succinate (SOLU-MEDROL) 125 mg/2 mL injection 125 mg (has no administration in time range)  albuterol (PROVENTIL) (2.5 MG/3ML) 0.083% nebulizer solution 10 mg (10 mg Nebulization Given 06/29/21 1113)    ED Course  I have reviewed the triage vital signs and the nursing notes.  Pertinent labs & imaging results that were available during my care of the patient were reviewed by me and considered in my medical decision making (see chart for details).    MDM Rules/Calculators/A&P                           Patient presents with clinical concern for acute respiratory failure likely secondary to COPD however other differentials include pulm edema/CHF/fluid overload from missing dialysis Tuesday.  Productive cough, low-grade temperature and focal infiltrate although similar to previous [facility] pneumonia also in the differential.  Likely infectious tip to COPD exacerbation.  Continuous neb ordered, steroids, IV antibiotics and blood cultures.  Patient requiring 2 L nasal cannula.  Patient will need admission to the hospital.  COVID test negative.   Kerry Underwood was evaluated in  Emergency Department on 06/29/2021 for the symptoms described in the history of present illness. He was evaluated in the context of the global COVID-19 pandemic, which necessitated consideration that the patient might be at risk for infection with the SARS-CoV-2 virus that causes COVID-19. Institutional protocols and algorithms that pertain to the evaluation of patients at risk for COVID-19 are in a state of rapid change based on information released by regulatory bodies including the CDC and federal and state organizations. These policies and algorithms were followed during the patient's care in the ED.  Final Clinical Impression(s) / ED Diagnoses Final diagnoses:  Acute exacerbation of chronic obstructive pulmonary disease (COPD) ([place])  Community acquired pneumonia, unspecified laterality  Hypoxia    Rx / DC Orders ED Discharge Orders     None        Elnora Morrison, MD 06/30/21 1438

## 2021-06-29 NOTE — ED Notes (Signed)
Pt repositioned for comfort and SOB, pt reports continued SOB. Pt placed on 2L [place]

## 2021-06-29 NOTE — ED Notes (Signed)
Patient's o2 sat is 95%

## 2021-06-29 NOTE — ED Triage Notes (Signed)
Pt brought in by [facility] from dialysis center. Pt reported to EMS that he has had a cold for about a week and therefore missed dialysis on Tuesday because he didn't feel well. Pt went to dialysis today and was having difficulty breathing. EMS reports pt was in tripod position and had inspiratory wheezing in all lobes when they arrived. Pt was given 2.'5mg'$  Albuterol nebulizer and then Duoneb nebulizer by EMS. EMS reports the nebulizers improved his breathing. Pt only had approximately half of his dialysis treatment today.

## 2021-06-29 NOTE — ED Notes (Signed)
PCXR obtained.

## 2021-06-30 DIAGNOSIS — D631 Anemia in chronic kidney disease: Secondary | ICD-10-CM

## 2021-06-30 DIAGNOSIS — Z992 Dependence on renal dialysis: Secondary | ICD-10-CM

## 2021-06-30 LAB — BLOOD CULTURE ID PANEL (REFLEXED) - BCID2

## 2021-06-30 LAB — CBC
HCT: 29.8 % — ABNORMAL LOW (ref 39.0–52.0)
HCT: 29.8 % — ABNORMAL LOW (ref 39.0–52.0)
Hemoglobin: 9.7 g/dL — ABNORMAL LOW (ref 13.0–17.0)
Hemoglobin: 10 g/dL — ABNORMAL LOW (ref 13.0–17.0)
MCH: 34 pg (ref 26.0–34.0)
MCH: 34.1 pg — ABNORMAL HIGH (ref 26.0–34.0)
MCHC: 32.6 g/dL (ref 30.0–36.0)
MCHC: 33.6 g/dL (ref 30.0–36.0)
MCV: 104.6 fL — ABNORMAL HIGH (ref 80.0–100.0)
MCV: 101.7 fL — ABNORMAL HIGH (ref 80.0–100.0)
Platelets: 161 10*3/uL (ref 150–400)
Platelets: 156 10*3/uL (ref 150–400)
RBC: 2.85 MIL/uL — ABNORMAL LOW (ref 4.22–5.81)
RBC: 2.93 MIL/uL — ABNORMAL LOW (ref 4.22–5.81)
RDW: 16 % — ABNORMAL HIGH (ref 11.5–15.5)
RDW: 15.8 % — ABNORMAL HIGH (ref 11.5–15.5)
WBC: 6.6 10*3/uL (ref 4.0–10.5)
WBC: 6.9 10*3/uL (ref 4.0–10.5)
nRBC: 0 % (ref 0.0–0.2)
nRBC: 0 % (ref 0.0–0.2)

## 2021-06-30 LAB — BASIC METABOLIC PANEL
Anion gap: 15 (ref 5–15)
BUN: 55 mg/dL — ABNORMAL HIGH (ref 8–23)
CO2: 24 mmol/L (ref 22–32)
Calcium: 8.1 mg/dL — ABNORMAL LOW (ref 8.9–10.3)
Chloride: 97 mmol/L — ABNORMAL LOW (ref 98–111)
Creatinine, Ser: 9.9 mg/dL — ABNORMAL HIGH (ref 0.61–1.24)
GFR, Estimated: 5 mL/min — ABNORMAL LOW (ref >60)
Glucose, Bld: 138 mg/dL — ABNORMAL HIGH (ref 70–99)
Potassium: 5.2 mmol/L — ABNORMAL HIGH (ref 3.5–5.1)
Sodium: 136 mmol/L (ref 135–145)

## 2021-06-30 LAB — RENAL FUNCTION PANEL
Albumin: 2.8 g/dL — ABNORMAL LOW (ref 3.5–5.0)
Anion gap: 13 (ref 5–15)
BUN: 57 mg/dL — ABNORMAL HIGH (ref 8–23)
CO2: 25 mmol/L (ref 22–32)
Calcium: 8 mg/dL — ABNORMAL LOW (ref 8.9–10.3)
Chloride: 97 mmol/L — ABNORMAL LOW (ref 98–111)
Creatinine, Ser: 9.63 mg/dL — ABNORMAL HIGH (ref 0.61–1.24)
GFR, Estimated: 5 mL/min — ABNORMAL LOW (ref >60)
Glucose, Bld: 124 mg/dL — ABNORMAL HIGH (ref 70–99)
Phosphorus: 7.4 mg/dL — ABNORMAL HIGH (ref 2.5–4.6)
Potassium: 4.7 mmol/L (ref 3.5–5.1)
Sodium: 135 mmol/L (ref 135–145)

## 2021-06-30 MED ORDER — HEPARIN SODIUM (PORCINE) 1000 UNIT/ML DIALYSIS: 3200.0000 [IU] | INTRAMUSCULAR | Status: DC | PRN

## 2021-06-30 MED ORDER — CALCITRIOL 0.25 MCG PO CAPS
0.7500 ug | ORAL_CAPSULE | ORAL | Status: DC
  Administered 2021-07-01: 0.75 ug via ORAL
  Filled 2021-06-30: qty 3

## 2021-06-30 MED ORDER — PENTAFLUOROPROP-TETRAFLUOROETH EX AERO: 1.0000 "application " | INHALATION_SPRAY | CUTANEOUS | Status: DC | PRN

## 2021-06-30 MED ORDER — LIDOCAINE HCL (PF) 1 % IJ SOLN: 5.0000 mL | INTRAMUSCULAR | Status: DC | PRN

## 2021-06-30 MED ORDER — HEPARIN SODIUM (PORCINE) 1000 UNIT/ML DIALYSIS: 1000.0000 [IU] | INTRAMUSCULAR | Status: DC | PRN

## 2021-06-30 MED ORDER — ALTEPLASE 2 MG IJ SOLR: 2.0000 mg | Freq: Once | INTRAMUSCULAR | Status: DC | PRN

## 2021-06-30 MED ORDER — CHLORHEXIDINE GLUCONATE CLOTH 2 % EX PADS
6.0000 | MEDICATED_PAD | Freq: Every day | CUTANEOUS | Status: DC
  Administered 2021-07-01 – 2021-07-02 (×2): 6 via TOPICAL

## 2021-06-30 MED ORDER — SODIUM CHLORIDE 0.9 % IV SOLN: 100.0000 mL | INTRAVENOUS | Status: DC | PRN

## 2021-06-30 MED ORDER — HEPARIN SODIUM (PORCINE) 1000 UNIT/ML IJ SOLN
INTRAMUSCULAR | Status: AC
  Administered 2021-06-30: 3200 [IU] via INTRAVENOUS_CENTRAL
  Filled 2021-06-30: qty 4

## 2021-06-30 MED ORDER — LIDOCAINE-PRILOCAINE 2.5-2.5 % EX CREA: 1.0000 "application " | TOPICAL_CREAM | CUTANEOUS | Status: DC | PRN

## 2021-06-30 NOTE — Procedures (Signed)
Patient seen and examined on Hemodialysis. BP 136/72 (BP Location: Left Arm)   Pulse 65   Temp 97.6 F (36.4 C) (Oral)   Resp 19   Ht '5\' 5"'$  (1.651 m)   Wt 81.1 kg   SpO2 98%   BMI 29.75 kg/m   QB 400 mL/ min via TDC, UF goal 2.5L.    On RA and feeling well.  No issues.    Tolerating treatment without complaints at this time.   Madelon Lips MD [facility] [phone] 2:04 PM

## 2021-06-30 NOTE — Progress Notes (Signed)
PROGRESS NOTE    Kerry Underwood  [id number] DOB: 05-14-1951 DOA: 06/29/2021 PCP: Kerry Brooklyn, FNP    Chief Complaint  Patient presents with   Shortness of Breath    Brief admission narrative:   Kerry Underwood is a 70 y.o. male with medical history significant of ongoing tobacco abuse, renal disease, hypertension, history of TIA/stroke, COPD and anemia of chronic kidney disease; who presented to the emergency department from his dialysis center secondary to worsening shortness of breath and productive coughing spells.  Patient expressed some chills but had not checked his temperature at home.  No bloody sputum seen.  Denies chest pain.  Symptoms has been present for the last 3-4 days and worsening.  On 06/27/2021 he could not go to his dialysis as previously scheduled due to how bad he was feeling especially from the breathing standpoint with minimal exertion.  Patient found to experience difficulty speaking in full sentences and diffuse wheezing appreciated. No nausea, no vomiting, no focal deficits or weakness.  Patient denies abdominal pain.   Of note, patient is now vaccinated against COVID and is not interested to be vaccinated.    Assessment & Plan: 1-acute respiratory failure with hypoxia secondary to COPD exacerbation and community-acquired pneumonia/bronchiectasis -Responding appropriately to treatment -At rest no requiring oxygen supplementation currently -Still short of breath with activity and expressing mild difficulty speaking in full sentences. -Continue with steroids, bronchodilators, nebulizer management, current antibiotics, flutter valve, antitussive/mucolytic medications and follow final culture results.  2-Gram positive cocci in his blood cultures -Staph species identified; 1 out of 2 suggesting most likely contaminant -Patient improving and without any further fevers at this time -Will follow speciation and discussed with ID if necessary -Continue current  antibiotics at this time.  3-hypertension secondary to other renal disorder -Continue current antihypertensive agents -Vital signs within normal limits currently.  4-end-stage renal disease -Appreciate nephrology service consultation and assistance in management -Continue hemodialysis treatment; with plans for back to schedule resumption on 07/01/2021.  5-BPH -Continue Flomax.  6-prior history of TIA/CVA without residual deficits -Continue aspirin for secondary prevention -Continue reflux medications.  7-tobacco abuse -Cessation counseling provided.  8-anemia of chronic kidney disease -No overt bleeding appreciated -Continue IV iron and Epogen as per renal discretion.  DVT prophylaxis: Heparin Code Status: Full code Family Communication: No family at bedside Disposition:   Status is: Inpatient  Remains inpatient appropriate because:IV treatments appropriate due to intensity of illness or inability to take PO  Dispo: The patient is from: Home              Anticipated d/c is to: Home              Patient currently is not medically stable to d/c.   Difficult to place patient No       Consultants:  Nephrology service  Procedures:  See below for x-ray report -Received inpatient hemodialysis  Antimicrobials:  Rocephin and Zithromax   Subjective: Afebrile; reported improvement in his breathing.  Almost able to speak in full sentences.  At rest not requiring oxygen supplementation.  Objective: Vitals:   06/30/21 1615 06/30/21 1630 06/30/21 1645 06/30/21 1655  BP: 109/60 110/62 104/60 108/61  Pulse: 70 71 72 75  Resp:    16  Temp:    97.9 F (36.6 C)  TempSrc:    Oral  SpO2:    96%  Weight:    80 kg  Height:        Intake/Output Summary (Last 24 hours)  at 06/30/2021 1832 Last data filed at 06/30/2021 1645 Gross per 24 hour  Intake 240 ml  Output 2084 ml  Net -1844 ml   Filed Weights   06/30/21 0601 06/30/21 1300 06/30/21 1655  Weight: 81.1 kg 81.7 kg  80 kg    Examination:  General exam: Afebrile, reports breathing to improve; no requiring oxygen supplementation at rest.  Still with intermittent coughing spells. Respiratory system: Positive expiratory wheezing; no crackles, positive rhonchi.  No using accessory muscles. Cardiovascular system: S1 & S2 heard, RRR. No JVD, murmurs, rubs, gallops or clicks. No pedal edema. Gastrointestinal system: Abdomen is nondistended, soft and nontender. No organomegaly or masses felt. Normal bowel sounds heard. Central nervous system: Alert and oriented. No focal neurological deficits. Extremities: No cyanosis or clubbing. Skin: No petechiae. Psychiatry: Judgement and insight appear normal. Mood & affect appropriate.     Data Reviewed: I have personally reviewed following labs and imaging studies  CBC: Recent Labs  Lab 06/29/21 1012 06/30/21 0503 06/30/21 0849  WBC 7.6 6.6 6.9  NEUTROABS 5.6  --   --   HGB 11.8* 9.7* 10.0*  HCT 35.1* 29.8* 29.8*  MCV 101.4* 104.6* 101.7*  PLT 171 161 [id number]    Basic Metabolic Panel: Recent Labs  Lab 06/29/21 1012 06/29/21 1014 06/30/21 0503 06/30/21 0849  NA 133*  --  136 135  K 3.7  --  5.2* 4.7  CL 93*  --  97* 97*  CO2 27  --  24 25  GLUCOSE 111*  --  138* 124*  BUN 31*  --  55* 57*  CREATININE 6.79*  --  9.90* 9.63*  CALCIUM 8.1*  --  8.1* 8.0*  MG  --  2.0  --   --   PHOS  --  3.7  --  7.4*    GFR: Estimated Creatinine Clearance: 6.9 mL/min (A) (by C-G formula based on SCr of 9.63 mg/dL (H)).  Liver Function Tests: Recent Labs  Lab 06/29/21 1012 06/30/21 0849  AST 39  --   ALT 20  --   ALKPHOS 65  --   BILITOT 0.6  --   PROT 8.2*  --   ALBUMIN 3.5 2.8*    CBG: Recent Labs  Lab 06/29/21 2117  GLUCAP 147*     Recent Results (from the past 240 hour(s))  Resp Panel by RT-PCR (Flu A&B, Covid) Nasopharyngeal Swab     Status: None   Collection Time: 06/29/21  9:58 AM   Specimen: Nasopharyngeal Swab; Nasopharyngeal(NP) swabs  in vial transport medium  Result Value Ref Range Status   SARS Coronavirus 2 by RT PCR NEGATIVE NEGATIVE Final    Comment: (NOTE) SARS-CoV-2 target nucleic acids are NOT DETECTED.  The SARS-CoV-2 RNA is generally detectable in upper respiratory specimens during the acute phase of infection. The lowest concentration of SARS-CoV-2 viral copies this assay can detect is 138 copies/mL. A negative result does not preclude SARS-Cov-2 infection and should not be used as the sole basis for treatment or other patient management decisions. A negative result may occur with  improper specimen collection/handling, submission of specimen other than nasopharyngeal swab, presence of viral mutation(s) within the areas targeted by this assay, and inadequate number of viral copies(<138 copies/mL). A negative result must be combined with clinical observations, patient history, and epidemiological information. The expected result is Negative.  Fact Sheet for Patients:  EntrepreneurPulse.com.au  Fact Sheet for Healthcare Providers:  IncredibleEmployment.be  This test is no t yet approved or cleared  by the [place] and  has been authorized for detection and/or diagnosis of SARS-CoV-2 by FDA under an Emergency Use Authorization (EUA). This EUA will remain  in effect (meaning this test can be used) for the duration of the COVID-19 declaration under Section 564(b)(1) of the Act, 21 U.S.C.section 360bbb-3(b)(1), unless the authorization is terminated  or revoked sooner.       Influenza A by PCR NEGATIVE NEGATIVE Final   Influenza B by PCR NEGATIVE NEGATIVE Final    Comment: (NOTE) The Xpert Xpress SARS-CoV-2/FLU/RSV plus assay is intended as an aid in the diagnosis of influenza from Nasopharyngeal swab specimens and should not be used as a sole basis for treatment. Nasal washings and aspirates are unacceptable for Xpert Xpress  SARS-CoV-2/FLU/RSV testing.  Fact Sheet for Patients: EntrepreneurPulse.com.au  Fact Sheet for Healthcare Providers: IncredibleEmployment.be  This test is not yet approved or cleared by the [place] FDA and has been authorized for detection and/or diagnosis of SARS-CoV-2 by FDA under an Emergency Use Authorization (EUA). This EUA will remain in effect (meaning this test can be used) for the duration of the COVID-19 declaration under Section 564(b)(1) of the Act, 21 U.S.C. section 360bbb-3(b)(1), unless the authorization is terminated or revoked.  Performed at [facility], [address] [place], [place] [address]   Blood culture (routine x 2)     Status: None (Preliminary result)   Collection Time: 06/29/21 10:14 AM   Specimen: BLOOD  Result Value Ref Range Status   Specimen Description   Final    BLOOD Performed at [facility], [address], [place], [place] [address]    Special Requests   Final    NONE Performed at [facility], [address] [place], [place] [address]    Culture  Setup Time   Final    GRAM POSITIVE COCCI aerobic bottle Gram Stain Report Called to,Read Back By and Verified With: Ferrell,S'@0458'$  by Zigmund Daniel, b 7.22.22 [facility] CRITICAL RESULT CALLED TO, READ BACK BY AND VERIFIED WITH: Margot Ables PHARMD '@1010'$  06/30/21 EB Performed at [facility], [place] [address], [place], [place] [address]    Culture GRAM POSITIVE COCCI IN CLUSTERS  Final   Report Status PENDING  Incomplete  Blood Culture ID Panel (Reflexed)     Status: Abnormal   Collection Time: 06/29/21 10:14 AM  Result Value Ref Range Status   Enterococcus faecalis NOT DETECTED NOT DETECTED Final   Enterococcus Faecium NOT DETECTED NOT DETECTED Final   Listeria monocytogenes NOT DETECTED NOT DETECTED Final   Staphylococcus species DETECTED (A) NOT DETECTED Final    Comment: CRITICAL RESULT CALLED TO, READ BACK BY AND VERIFIED WITH: STEVEN  HURTH PHARMD '@1010'$  06/30/21 EB     Staphylococcus aureus (BCID) NOT DETECTED NOT DETECTED Final   Staphylococcus epidermidis NOT DETECTED NOT DETECTED Final   Staphylococcus lugdunensis NOT DETECTED NOT DETECTED Final   Streptococcus species NOT DETECTED NOT DETECTED Final   Streptococcus agalactiae NOT DETECTED NOT DETECTED Final   Streptococcus pneumoniae NOT DETECTED NOT DETECTED Final   Streptococcus pyogenes NOT DETECTED NOT DETECTED Final   A.calcoaceticus-baumannii NOT DETECTED NOT DETECTED Final   Bacteroides fragilis NOT DETECTED NOT DETECTED Final   Enterobacterales NOT DETECTED NOT DETECTED Final   Enterobacter cloacae complex NOT DETECTED NOT DETECTED Final   Escherichia coli NOT DETECTED NOT DETECTED Final   Klebsiella aerogenes NOT DETECTED NOT DETECTED Final   Klebsiella oxytoca NOT DETECTED NOT DETECTED Final   Klebsiella pneumoniae NOT  DETECTED NOT DETECTED Final   Proteus species NOT DETECTED NOT DETECTED Final   Salmonella species NOT DETECTED NOT DETECTED Final   Serratia marcescens NOT DETECTED NOT DETECTED Final   Haemophilus influenzae NOT DETECTED NOT DETECTED Final   Neisseria meningitidis NOT DETECTED NOT DETECTED Final   Pseudomonas aeruginosa NOT DETECTED NOT DETECTED Final   Stenotrophomonas maltophilia NOT DETECTED NOT DETECTED Final   Candida albicans NOT DETECTED NOT DETECTED Final   Candida auris NOT DETECTED NOT DETECTED Final   Candida glabrata NOT DETECTED NOT DETECTED Final   Candida krusei NOT DETECTED NOT DETECTED Final   Candida parapsilosis NOT DETECTED NOT DETECTED Final   Candida tropicalis NOT DETECTED NOT DETECTED Final   Cryptococcus neoformans/gattii NOT DETECTED NOT DETECTED Final    Comment: Performed at [facility], [place] [address] [place], [place] [address]  Blood culture (routine x 2)     Status: None (Preliminary result)   Collection Time: 06/29/21 11:52 AM   Specimen: BLOOD  Result Value Ref Range Status   Specimen  Description BLOOD  Final   Special Requests NONE  Final   Culture   Final    NO GROWTH < 24 HOURS Performed at [facility], [address], [place], [place] [address]    Report Status PENDING  Incomplete     Radiology Studies: DG Chest Portable 1 View  Result Date: 06/29/2021 CLINICAL DATA:  Shortness of breath. EXAM: PORTABLE CHEST 1 VIEW COMPARISON:  11/22/2020 FINDINGS: The lungs are clear without focal pneumonia, edema, pneumothorax or pleural effusion. Similar patchy opacity at the left base is substantially decreased since chest x-ray of 09/17/2020 and may reflect chronic atelectasis or scarring. Infection not entirely excluded but considered less likely. The cardiopericardial silhouette is within normal limits for size. Right IJ central line tip overlies the SVC/RA junction. Old left clavicle fracture again noted. Telemetry leads overlie the chest. IMPRESSION: Persistent patchy opacity at the left base, likely chronic although component of airspace infection cannot be excluded. Electronically Signed   By: Misty Stanley M.D.   On: 06/29/2021 11:19     Scheduled Meds:  albuterol  20 mg/hr Nebulization Once   aspirin EC  325 mg Oral Daily   [START ON 07/01/2021] calcitRIOL  0.75 mcg Oral Q T,Th,Sa-HD   Chlorhexidine Gluconate Cloth  6 each Topical Daily   Chlorhexidine Gluconate Cloth  6 each Topical Q0600   heparin  5,000 Units Subcutaneous Q8H   ipratropium  0.5 mg Nebulization Once   metoprolol tartrate  25 mg Oral BID   tamsulosin  0.4 mg Oral QHS   Continuous Infusions:  sodium chloride     sodium chloride     azithromycin 500 mg (06/30/21 1545)     LOS: 1 day    Time spent: 35 minutes   Barton Dubois, MD [facility]   To contact the attending provider between 7A-7P or the covering provider during after hours 7P-7A, please log into the web site [email or web address] and access using universal [facility] [place] password for that web site. If you do not have the password,  please call the hospital operator.  06/30/2021, 6:32 PM

## 2021-06-30 NOTE — Consult Note (Signed)
Reason for Consult: To manage dialysis and dialysis related needs Referring Physician: Dr Barnett Abu Petzold is an 70 y.o. male.   HPI: PT is a 28 M with a PMH sig for HTN, HLD, CHF, COPD, and ESRD on HD TTS who is now seen at the request of Dr Dyann Kief for management of ESRD and provision of dialysis.  Was in usual state of health untile 3-4 days ago when started having productive cough and sputum production.  Felt too poorly to go to HD Tuesday.  Went Thursday but halfway through had shaking chills/ coughing spells and so was rinsed back and brought ot ED.   Was found to be febrile, WBC ct 7.6, wheezing, and had LLL opacity on CXR.  Was given bronchodilators and azithro/ CTX and admitted.  In this setting we are asked to see.  He is feeling much better and is on RA now.  Looks like blood cultures are + for Staph species (further ID to follow).    Dialysis Orders: Center: [facility]  on TTS . EDW 80kg HD Bath 2K/2.5Ca  Time 4:15 profile 4 Heparin none. Access RIJ TDC BFR 400 DFR 800    Calcitriol 0.75 mcg po/HD  Past Medical History:  Diagnosis Date   Anemia    CHF (congestive heart failure) (HCC)    COPD (chronic obstructive pulmonary disease) ([place])    Full dentures    Hypertension    Pyelonephritis 09/11/2017   Renal disorder    Stroke [facility])    " A few mini strokes, I didn't know it "    Past Surgical History:  Procedure Laterality Date   BASCILIC VEIN TRANSPOSITION Right 04/19/2020   Procedure: Basilic Vein Transposition Right Arm;  Surgeon: Waynetta Sandy, MD;  Location: [place];  Service: Vascular;  Laterality: Right;   INSERTION OF DIALYSIS CATHETER Right 04/19/2020   Procedure: Insertion Of Right Internal Jugular Dialysis Catheter;  Surgeon: Waynetta Sandy, MD;  [facility] OR;  Service: Vascular;  Laterality: Right;   MULTIPLE TOOTH EXTRACTIONS      Family History  Problem Relation Age of Onset   Alzheimer's disease Mother     Social History:  reports  that he has been smoking cigarettes. He has been smoking an average of .5 packs per day. He has quit using smokeless tobacco.  His smokeless tobacco use included chew. He reports previous drug use. Drug: Marijuana. He reports that he does not drink alcohol.  Allergies:  Allergies  Allergen Reactions   Strawberry (Diagnostic)     Medications: Scheduled:  albuterol  20 mg/hr Nebulization Once   aspirin EC  325 mg Oral Daily   Chlorhexidine Gluconate Cloth  6 each Topical Daily   Chlorhexidine Gluconate Cloth  6 each Topical Q0600   heparin  5,000 Units Subcutaneous Q8H   ipratropium  0.5 mg Nebulization Once   metoprolol tartrate  25 mg Oral BID   tamsulosin  0.4 mg Oral QHS    Results for orders placed or performed during the hospital encounter of 06/29/21 (from the past 48 hour(s))  Resp Panel by RT-PCR (Flu A&B, Covid) Nasopharyngeal Swab     Status: None   Collection Time: 06/29/21  9:58 AM   Specimen: Nasopharyngeal Swab; Nasopharyngeal(NP) swabs in vial transport medium  Result Value Ref Range   SARS Coronavirus 2 by RT PCR NEGATIVE NEGATIVE    Comment: (NOTE) SARS-CoV-2 target nucleic acids are NOT DETECTED.  The SARS-CoV-2 RNA is generally detectable in upper respiratory  specimens during the acute phase of infection. The lowest concentration of SARS-CoV-2 viral copies this assay can detect is 138 copies/mL. A negative result does not preclude SARS-Cov-2 infection and should not be used as the sole basis for treatment or other patient management decisions. A negative result may occur with  improper specimen collection/handling, submission of specimen other than nasopharyngeal swab, presence of viral mutation(s) within the areas targeted by this assay, and inadequate number of viral copies(<138 copies/mL). A negative result must be combined with clinical observations, patient history, and epidemiological information. The expected result is Negative.  Fact Sheet for  Patients:  EntrepreneurPulse.com.au  Fact Sheet for Healthcare Providers:  IncredibleEmployment.be  This test is no t yet approved or cleared by the [place] FDA and  has been authorized for detection and/or diagnosis of SARS-CoV-2 by FDA under an Emergency Use Authorization (EUA). This EUA will remain  in effect (meaning this test can be used) for the duration of the COVID-19 declaration under Section 564(b)(1) of the Act, 21 U.S.C.section 360bbb-3(b)(1), unless the authorization is terminated  or revoked sooner.       Influenza A by PCR NEGATIVE NEGATIVE   Influenza B by PCR NEGATIVE NEGATIVE    Comment: (NOTE) The Xpert Xpress SARS-CoV-2/FLU/RSV plus assay is intended as an aid in the diagnosis of influenza from Nasopharyngeal swab specimens and should not be used as a sole basis for treatment. Nasal washings and aspirates are unacceptable for Xpert Xpress SARS-CoV-2/FLU/RSV testing.  Fact Sheet for Patients: EntrepreneurPulse.com.au  Fact Sheet for Healthcare Providers: IncredibleEmployment.be  This test is not yet approved or cleared by the [place] FDA and has been authorized for detection and/or diagnosis of SARS-CoV-2 by FDA under an Emergency Use Authorization (EUA). This EUA will remain in effect (meaning this test can be used) for the duration of the COVID-19 declaration under Section 564(b)(1) of the Act, 21 U.S.C. section 360bbb-3(b)(1), unless the authorization is terminated or revoked.  Performed at [facility], [address], [place], [place] [address]   Comprehensive metabolic panel     Status: Abnormal   Collection Time: 06/29/21 10:12 AM  Result Value Ref Range   Sodium 133 (L) 135 - 145 mmol/L   Potassium 3.7 3.5 - 5.1 mmol/L   Chloride 93 (L) 98 - 111 mmol/L   CO2 27 22 - 32 mmol/L   Glucose, Bld 111 (H) 70 - 99 mg/dL    Comment: Glucose reference range applies only  to samples taken after fasting for at least 8 hours.   BUN 31 (H) 8 - 23 mg/dL   Creatinine, Ser 6.79 (H) 0.61 - 1.24 mg/dL   Calcium 8.1 (L) 8.9 - 10.3 mg/dL   Total Protein 8.2 (H) 6.5 - 8.1 g/dL   Albumin 3.5 3.5 - 5.0 g/dL   AST 39 15 - 41 U/L   ALT 20 0 - 44 U/L   Alkaline Phosphatase 65 38 - 126 U/L   Total Bilirubin 0.6 0.3 - 1.2 mg/dL   GFR, Estimated 8 (L) >60 mL/min    Comment: (NOTE) Calculated using the CKD-EPI Creatinine Equation (2021)    Anion gap 13 5 - 15    Comment: Performed at [facility], [address], [place], [place] [address]  CBC with Differential     Status: Abnormal   Collection Time: 06/29/21 10:12 AM  Result Value Ref Range   WBC 7.6 4.0 - 10.5 K/uL   RBC 3.46 (L) 4.22 - 5.81 MIL/uL   Hemoglobin  11.8 (L) 13.0 - 17.0 g/dL   HCT 35.1 (L) 39.0 - 52.0 %   MCV 101.4 (H) 80.0 - 100.0 fL   MCH 34.1 (H) 26.0 - 34.0 pg   MCHC 33.6 30.0 - 36.0 g/dL   RDW 16.6 (H) 11.5 - 15.5 %   Platelets 171 150 - 400 K/uL   nRBC 0.0 0.0 - 0.2 %   Neutrophils Relative % 72 %   Neutro Abs 5.6 1.7 - 7.7 K/uL   Lymphocytes Relative 14 %   Lymphs Abs 1.1 0.7 - 4.0 K/uL   Monocytes Relative 11 %   Monocytes Absolute 0.8 0.1 - 1.0 K/uL   Eosinophils Relative 2 %   Eosinophils Absolute 0.1 0.0 - 0.5 K/uL   Basophils Relative 0 %   Basophils Absolute 0.0 0.0 - 0.1 K/uL   Immature Granulocytes 1 %   Abs Immature Granulocytes 0.04 0.00 - 0.07 K/uL    Comment: Performed at [facility], [address], [place], [place] [address]  Brain natriuretic peptide     Status: Abnormal   Collection Time: 06/29/21 10:12 AM  Result Value Ref Range   B Natriuretic Peptide 287.0 (H) 0.0 - 100.0 pg/mL    Comment: Performed at [facility] [address], [place], [place] [address]  Blood gas, venous     Status: Abnormal   Collection Time: 06/29/21 10:14 AM  Result Value Ref Range   FIO2 21.00    pH, Ven 7.412 7.250 - 7.430   pCO2, Ven 41.0 (L) 44.0 - 60.0 mmHg   pO2, Ven 39.9 32.0 -  45.0 mmHg   Bicarbonate 25.2 20.0 - 28.0 mmol/L   Acid-Base Excess 1.4 0.0 - 2.0 mmol/L   O2 Saturation 72.7 %   Patient temperature 37.6     Comment: Performed at [facility], [address], [place], [place] [address]  Lactic acid, plasma     Status: None   Collection Time: 06/29/21 10:14 AM  Result Value Ref Range   Lactic Acid, Venous 1.5 0.5 - 1.9 mmol/L    Comment: Performed at [facility], [address], [place], [place] [address]  Blood culture (routine x 2)     Status: None (Preliminary result)   Collection Time: 06/29/21 10:14 AM   Specimen: BLOOD  Result Value Ref Range   Specimen Description      BLOOD Performed at [facility], [address] [place], [place] [address]    Special Requests      NONE Performed at [facility], [address] [place], [place] [address]    Culture  Setup Time      GRAM POSITIVE COCCI aerobic bottle Gram Stain Report Called to,Read Back By and Verified With: Ferrell,S'@[id number]'$  by Zigmund Daniel, b 7.22.22 [facility] CRITICAL RESULT CALLED TO, READ BACK BY AND VERIFIED WITH: Margot Ables PHARMD '@1010'$  06/30/21 EB Performed at [facility], [address] [place], [place] [address]    Culture GRAM POSITIVE COCCI IN CLUSTERS    Report Status PENDING   Magnesium     Status: None   Collection Time: 06/29/21 10:14 AM  Result Value Ref Range   Magnesium 2.0 1.7 - 2.4 mg/dL    Comment: Performed at [facility], [address] [place], [place] [address]  Phosphorus     Status: None   Collection Time: 06/29/21 10:14 AM  Result Value Ref Range   Phosphorus 3.7 2.5 - 4.6 mg/dL    Comment: Performed at [facility],  [address], [place], [place] [address]  Blood Culture ID Panel (Reflexed)     Status: Abnormal   Collection Time: 06/29/21 10:14 AM  Result Value Ref Range   Enterococcus faecalis NOT DETECTED NOT DETECTED   Enterococcus Faecium NOT DETECTED NOT DETECTED   Listeria monocytogenes NOT DETECTED NOT DETECTED   Staphylococcus  species DETECTED (A) NOT DETECTED    Comment: CRITICAL RESULT CALLED TO, READ BACK BY AND VERIFIED WITH: STEVEN HURTH PHARMD '@1010'$  06/30/21 EB     Staphylococcus aureus (BCID) NOT DETECTED NOT DETECTED   Staphylococcus epidermidis NOT DETECTED NOT DETECTED   Staphylococcus lugdunensis NOT DETECTED NOT DETECTED   Streptococcus species NOT DETECTED NOT DETECTED   Streptococcus agalactiae NOT DETECTED NOT DETECTED   Streptococcus pneumoniae NOT DETECTED NOT DETECTED   Streptococcus pyogenes NOT DETECTED NOT DETECTED   A.calcoaceticus-baumannii NOT DETECTED NOT DETECTED   Bacteroides fragilis NOT DETECTED NOT DETECTED   Enterobacterales NOT DETECTED NOT DETECTED   Enterobacter cloacae complex NOT DETECTED NOT DETECTED   Escherichia coli NOT DETECTED NOT DETECTED   Klebsiella aerogenes NOT DETECTED NOT DETECTED   Klebsiella oxytoca NOT DETECTED NOT DETECTED   Klebsiella pneumoniae NOT DETECTED NOT DETECTED   Proteus species NOT DETECTED NOT DETECTED   Salmonella species NOT DETECTED NOT DETECTED   Serratia marcescens NOT DETECTED NOT DETECTED   Haemophilus influenzae NOT DETECTED NOT DETECTED   Neisseria meningitidis NOT DETECTED NOT DETECTED   Pseudomonas aeruginosa NOT DETECTED NOT DETECTED   Stenotrophomonas maltophilia NOT DETECTED NOT DETECTED   Candida albicans NOT DETECTED NOT DETECTED   Candida auris NOT DETECTED NOT DETECTED   Candida glabrata NOT DETECTED NOT DETECTED   Candida krusei NOT DETECTED NOT DETECTED   Candida parapsilosis NOT DETECTED NOT DETECTED   Candida tropicalis NOT DETECTED NOT DETECTED   Cryptococcus neoformans/gattii NOT DETECTED NOT DETECTED    Comment: Performed at [facility], [address], [place], [place] [address]  Blood culture (routine x 2)     Status: None (Preliminary result)   Collection Time: 06/29/21 11:52 AM   Specimen: BLOOD  Result Value Ref Range   Specimen Description BLOOD    Special Requests NONE    Culture      NO GROWTH <  24 HOURS Performed at [facility], [address] [place], [place] [address]    Report Status PENDING   Lactic acid, plasma     Status: None   Collection Time: 06/29/21  1:34 PM  Result Value Ref Range   Lactic Acid, Venous 1.9 0.5 - 1.9 mmol/L    Comment: Performed at [facility], [address], [place], [place] [address]  Glucose, capillary     Status: Abnormal   Collection Time: 06/29/21  9:17 PM  Result Value Ref Range   Glucose-Capillary 147 (H) 70 - 99 mg/dL    Comment: Glucose reference range applies only to samples taken after fasting for at least 8 hours.  Basic metabolic panel     Status: Abnormal   Collection Time: 06/30/21  5:03 AM  Result Value Ref Range   Sodium 136 135 - 145 mmol/L   Potassium 5.2 (H) 3.5 - 5.1 mmol/L    Comment: DELTA CHECK NOTED   Chloride 97 (L) 98 - 111 mmol/L   CO2 24 22 - 32 mmol/L   Glucose, Bld 138 (H) 70 - 99 mg/dL    Comment: Glucose reference range applies only to samples taken after fasting for at least 8 hours.  BUN 55 (H) 8 - 23 mg/dL   Creatinine, Ser 9.90 (H) 0.61 - 1.24 mg/dL   Calcium 8.1 (L) 8.9 - 10.3 mg/dL   GFR, Estimated 5 (L) >60 mL/min    Comment: (NOTE) Calculated using the CKD-EPI Creatinine Equation (2021)    Anion gap 15 5 - 15    Comment: Performed at [facility], [address] [place], [place] [address]  CBC     Status: Abnormal   Collection Time: 06/30/21  5:03 AM  Result Value Ref Range   WBC 6.6 4.0 - 10.5 K/uL   RBC 2.85 (L) 4.22 - 5.81 MIL/uL   Hemoglobin 9.7 (L) 13.0 - 17.0 g/dL   HCT 29.8 (L) 39.0 - 52.0 %   MCV 104.6 (H) 80.0 - 100.0 fL   MCH 34.0 26.0 - 34.0 pg   MCHC 32.6 30.0 - 36.0 g/dL   RDW 16.0 (H) 11.5 - 15.5 %   Platelets 161 150 - 400 K/uL   nRBC 0.0 0.0 - 0.2 %    Comment: Performed at [facility], [address] [place], [place] [address]  Renal function panel     Status: Abnormal   Collection Time: 06/30/21  8:49 AM  Result Value Ref Range   Sodium 135 135 - 145 mmol/L    Potassium 4.7 3.5 - 5.1 mmol/L   Chloride 97 (L) 98 - 111 mmol/L   CO2 25 22 - 32 mmol/L   Glucose, Bld 124 (H) 70 - 99 mg/dL    Comment: Glucose reference range applies only to samples taken after fasting for at least 8 hours.   BUN 57 (H) 8 - 23 mg/dL   Creatinine, Ser 9.63 (H) 0.61 - 1.24 mg/dL   Calcium 8.0 (L) 8.9 - 10.3 mg/dL   Phosphorus 7.4 (H) 2.5 - 4.6 mg/dL   Albumin 2.8 (L) 3.5 - 5.0 g/dL   GFR, Estimated 5 (L) >60 mL/min    Comment: (NOTE) Calculated using the CKD-EPI Creatinine Equation (2021)    Anion gap 13 5 - 15    Comment: Performed at [facility], [address], [place], [place] [address]  CBC     Status: Abnormal   Collection Time: 06/30/21  8:49 AM  Result Value Ref Range   WBC 6.9 4.0 - 10.5 K/uL   RBC 2.93 (L) 4.22 - 5.81 MIL/uL   Hemoglobin 10.0 (L) 13.0 - 17.0 g/dL   HCT 29.8 (L) 39.0 - 52.0 %   MCV 101.7 (H) 80.0 - 100.0 fL   MCH 34.1 (H) 26.0 - 34.0 pg   MCHC 33.6 30.0 - 36.0 g/dL   RDW 15.8 (H) 11.5 - 15.5 %   Platelets 156 150 - 400 K/uL   nRBC 0.0 0.0 - 0.2 %    Comment: Performed at [facility], [address] [place], [place] [address]    DG Chest Portable 1 View  Result Date: 06/29/2021 CLINICAL DATA:  Shortness of breath. EXAM: PORTABLE CHEST 1 VIEW COMPARISON:  11/22/2020 FINDINGS: The lungs are clear without focal pneumonia, edema, pneumothorax or pleural effusion. Similar patchy opacity at the left base is substantially decreased since chest x-ray of 09/17/2020 and may reflect chronic atelectasis or scarring. Infection not entirely excluded but considered less likely. The cardiopericardial silhouette is within normal limits for size. Right IJ central line tip overlies the SVC/RA junction. Old left clavicle fracture again noted. Telemetry leads overlie the chest. IMPRESSION: Persistent patchy opacity at the left base, likely chronic although component of  airspace infection cannot be excluded. Electronically Signed   By: Misty Stanley M.D.   On:  06/29/2021 11:19    ROS: all other systems reviewed and are negative except as per HPI Blood pressure 136/72, pulse 65, temperature 97.6 F (36.4 C), temperature source Oral, resp. rate 19, height '5\' 5"'$  (1.651 m), weight 81.1 kg, SpO2 98 %. . GEN: nad, lying in bed on RA HEENT EOMI PERRL NECK no JVD PULM poor air movement bilaterally, no frank wheezing CV RRR ABD soft EXT R leg tender, toes are red.  No streaking redness or cellulitic appearing rash NEURO AAO x 3 ACCESS R IJ TDC, R BVT, +T/B  Assessment/Plan: 1 Acute hypoxic RF: d/t COPD exacerbation/ CAP.  Blood cultures + for staph species, further ID to follow.  Anticipate a change in antibiotics depending on results.  Ongoing tobacco abuse.   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  Dispo: pending  Madelon Lips 06/30/2021, 1:39 PM

## 2021-06-30 NOTE — Procedures (Addendum)
   HEMODIALYSIS TREATMENT NOTE:  3.5 hour heparin-free HD completed via RIJ TDC.  Catheter entry site is unremarkable.  Goal met: 2L removed although UF was interrupted during the last 30 minutes of treatment for c/o leg cramps.  All blood was returned.  No changes from pre-HD assessment.  Rockwell Alexandria, RN   HBsAg was ordered with HD as the result from the outpatient HD center was >41 days old.  After specimen was sent, pt informed me he received a dose of Engerix with his last outpatient tx.  I've requested the test be cancelled/credited Jenny Reichmann, [facility]) for risk of transient antigenemia.  ap

## 2021-07-01 LAB — CBC
HCT: 28.6 % — ABNORMAL LOW (ref 39.0–52.0)
Hemoglobin: 9.7 g/dL — ABNORMAL LOW (ref 13.0–17.0)
MCH: 34.3 pg — ABNORMAL HIGH (ref 26.0–34.0)
MCHC: 33.9 g/dL (ref 30.0–36.0)
MCV: 101.1 fL — ABNORMAL HIGH (ref 80.0–100.0)
Platelets: 188 10*3/uL (ref 150–400)
RBC: 2.83 MIL/uL — ABNORMAL LOW (ref 4.22–5.81)
RDW: 15.7 % — ABNORMAL HIGH (ref 11.5–15.5)
WBC: 8.2 10*3/uL (ref 4.0–10.5)
nRBC: 0 % (ref 0.0–0.2)

## 2021-07-01 LAB — RENAL FUNCTION PANEL
Albumin: 2.7 g/dL — ABNORMAL LOW (ref 3.5–5.0)
Anion gap: 14 (ref 5–15)
BUN: 49 mg/dL — ABNORMAL HIGH (ref 8–23)
CO2: 26 mmol/L (ref 22–32)
Calcium: 7.7 mg/dL — ABNORMAL LOW (ref 8.9–10.3)
Chloride: 91 mmol/L — ABNORMAL LOW (ref 98–111)
Creatinine, Ser: 6.51 mg/dL — ABNORMAL HIGH (ref 0.61–1.24)
GFR, Estimated: 8 mL/min — ABNORMAL LOW (ref >60)
Glucose, Bld: 104 mg/dL — ABNORMAL HIGH (ref 70–99)
Phosphorus: 5.9 mg/dL — ABNORMAL HIGH (ref 2.5–4.6)
Potassium: 3.9 mmol/L (ref 3.5–5.1)
Sodium: 131 mmol/L — ABNORMAL LOW (ref 135–145)

## 2021-07-01 MED ORDER — CEFDINIR 300 MG PO CAPS: 300.0000 mg | ORAL_CAPSULE | Freq: Two times a day (BID) | ORAL | Status: DC

## 2021-07-01 MED ORDER — IPRATROPIUM BROMIDE 0.02 % IN SOLN
RESPIRATORY_TRACT | Status: AC
  Administered 2021-07-01: 0.5 mg
  Filled 2021-07-01: qty 2.5

## 2021-07-01 MED ORDER — ALBUTEROL SULFATE (2.5 MG/3ML) 0.083% IN NEBU: 2.5000 mg | INHALATION_SOLUTION | RESPIRATORY_TRACT | Status: DC | PRN

## 2021-07-01 MED ORDER — IPRATROPIUM-ALBUTEROL 0.5-2.5 (3) MG/3ML IN SOLN
3.0000 mL | Freq: Four times a day (QID) | RESPIRATORY_TRACT | Status: DC
  Administered 2021-07-02 (×2): 3 mL via RESPIRATORY_TRACT
  Filled 2021-07-01 (×2): qty 3

## 2021-07-01 MED ORDER — DOXYCYCLINE HYCLATE 100 MG PO TABS
100.0000 mg | ORAL_TABLET | Freq: Two times a day (BID) | ORAL | Status: DC
  Administered 2021-07-01 – 2021-07-02 (×2): 100 mg via ORAL
  Filled 2021-07-01 (×3): qty 1

## 2021-07-01 MED ORDER — ALBUTEROL SULFATE (2.5 MG/3ML) 0.083% IN NEBU
INHALATION_SOLUTION | RESPIRATORY_TRACT | Status: AC
  Administered 2021-07-01: 2.5 mg
  Filled 2021-07-01: qty 3

## 2021-07-01 MED ORDER — HEPARIN SODIUM (PORCINE) 1000 UNIT/ML IJ SOLN
INTRAMUSCULAR | Status: AC
  Administered 2021-07-01: 3200 [IU] via INTRAVENOUS_CENTRAL
  Filled 2021-07-01: qty 4

## 2021-07-01 NOTE — Progress Notes (Signed)
PROGRESS NOTE    Kerry Underwood  [id number] DOB: 1951/09/17 DOA: 06/29/2021 PCP: [name], FNP    Chief Complaint  Patient presents with   Shortness of Breath    Brief admission narrative:   Kerry Underwood is a 70 y.o. male with medical history significant of ongoing tobacco abuse, renal disease, hypertension, history of TIA/stroke, COPD and anemia of chronic kidney disease; who presented to the emergency department from his dialysis center secondary to worsening shortness of breath and productive coughing spells.  Patient expressed some chills but had not checked his temperature at home.  No bloody sputum seen.  Denies chest pain.  Symptoms has been present for the last 3-4 days and worsening.  On 06/27/2021 he could not go to his dialysis as previously scheduled due to how bad he was feeling especially from the breathing standpoint with minimal exertion.  Patient found to experience difficulty speaking in full sentences and diffuse wheezing appreciated. No nausea, no vomiting, no focal deficits or weakness.  Patient denies abdominal pain.   Of note, patient is now vaccinated against COVID and is not interested to be vaccinated.    Assessment & Plan: 1-acute respiratory failure with hypoxia secondary to COPD exacerbation and community-acquired pneumonia/bronchiectasis -Responding appropriately to treatment -Overall feeling better; but is still requiring 2 L nasal cannula supplementation, with ongoing intermittent coughing spells and demonstrating mild difficulty speaking in full sentences.  Diffuse expiratory wheezing appreciated on exam. -Continue with steroids, bronchodilators, nebulizer management, antibiotics, flutter valve, antitussive/mucolytic medications and follow clinical response. -Wean off oxygen supplementation as tolerated.  2-Gram positive cocci in his blood cultures -Staph species identified; 1 out of 2 suggesting most likely contaminant -Patient improving and  without any further fevers at this time -Will follow speciation and discussed with ID if necessary -Continue current antibiotics at this time.  3-hypertension secondary to other renal disorder -Continue current antihypertensive agents -Vital signs within normal limits currently.  4-end-stage renal disease -Appreciate nephrology service consultation and assistance in management -Continue hemodialysis treatment; with plans for back to schedule resumption on 07/01/2021.  5-BPH -Continue Flomax.  6-prior history of TIA/CVA without residual deficits -Continue aspirin for secondary prevention -Continue risk factors modifications  7-tobacco abuse -Cessation counseling provided.  8-anemia of chronic kidney disease -No overt bleeding appreciated -Continue IV iron and Epogen as per renal discretion.  DVT prophylaxis: Heparin Code Status: Full code Family Communication: No family at bedside Disposition:   Status is: Inpatient  Remains inpatient appropriate because:IV treatments appropriate due to intensity of illness or inability to take PO  Dispo: The patient is from: Home              Anticipated d/c is to: Home              Patient currently is not medically stable to d/c.   Difficult to place patient No       Consultants:  Nephrology service  Procedures:  See below for x-ray report -Received inpatient hemodialysis  Antimicrobials:  Rocephin and Zithromax   Subjective: Afebrile, no chest pain, no nausea, no vomiting.  Mild difficulty speaking in full sentences, intermittent coughing spells and requiring 2 L nasal cannula supplementation.  Objective: Vitals:   07/01/21 1600 07/01/21 1615 07/01/21 1630 07/01/21 1645  BP: 106/75 130/70 136/72 129/65  Pulse: 71 65 65 64  Resp:      Temp:      TempSrc:      SpO2:      Weight:  Height:        Intake/Output Summary (Last 24 hours) at 07/01/2021 1717 Last data filed at 07/01/2021 1300 Gross per 24 hour   Intake 1189.31 ml  Output 200 ml  Net 989.31 ml   Filed Weights   06/30/21 1655 07/01/21 0600 07/01/21 1450  Weight: 80 kg 80.7 kg 80.7 kg    Examination: General exam: Alert, awake, oriented x 3; short of breath with activity; intermittent coughing spells reported.  Requiring 2 L nasal cannula supplementation and demonstrating mild difficulty speaking in full sentences. Respiratory system: Positive scattered rhonchi; expiratory wheezing appreciated.  No using accessory muscles.  Mild tachypnea on exertion reported Cardiovascular system:RRR. No rubs or gallops; no JVD. Gastrointestinal system: Abdomen is nondistended, soft and nontender. No organomegaly or masses felt. Normal bowel sounds heard. Central nervous system: Alert and oriented. No focal neurological deficits. Extremities: No cyanosis or clubbing. Skin: No petechiae. Psychiatry: Judgement and insight appear normal. Mood & affect appropriate.   Data Reviewed: I have personally reviewed following labs and imaging studies  CBC: Recent Labs  Lab 06/29/21 1012 06/30/21 0503 06/30/21 0849 07/01/21 1511  WBC 7.6 6.6 6.9 8.2  NEUTROABS 5.6  --   --   --   HGB 11.8* 9.7* 10.0* 9.7*  HCT 35.1* 29.8* 29.8* 28.6*  MCV 101.4* 104.6* 101.7* 101.1*  PLT 171 161 156 0000000    Basic Metabolic Panel: Recent Labs  Lab 06/29/21 1012 06/29/21 1014 06/30/21 0503 06/30/21 0849 07/01/21 1511  NA 133*  --  136 135 131*  K 3.7  --  5.2* 4.7 3.9  CL 93*  --  97* 97* 91*  CO2 27  --  '24 25 26  '$ GLUCOSE 111*  --  138* 124* 104*  BUN 31*  --  55* 57* 49*  CREATININE 6.79*  --  9.90* 9.63* 6.51*  CALCIUM 8.1*  --  8.1* 8.0* 7.7*  MG  --  2.0  --   --   --   PHOS  --  3.7  --  7.4* 5.9*    GFR: Estimated Creatinine Clearance: 10.2 mL/min (A) (by C-G formula based on SCr of 6.51 mg/dL (H)).  Liver Function Tests: Recent Labs  Lab 06/29/21 1012 06/30/21 0849 07/01/21 1511  AST 39  --   --   ALT 20  --   --   ALKPHOS 65  --    --   BILITOT 0.6  --   --   PROT 8.2*  --   --   ALBUMIN 3.5 2.8* 2.7*    CBG: Recent Labs  Lab 06/29/21 2117  GLUCAP 147*     Recent Results (from the past 240 hour(s))  Resp Panel by RT-PCR (Flu A&B, Covid) Nasopharyngeal Swab     Status: None   Collection Time: 06/29/21  9:58 AM   Specimen: Nasopharyngeal Swab; Nasopharyngeal(NP) swabs in vial transport medium  Result Value Ref Range Status   SARS Coronavirus 2 by RT PCR NEGATIVE NEGATIVE Final    Comment: (NOTE) SARS-CoV-2 target nucleic acids are NOT DETECTED.  The SARS-CoV-2 RNA is generally detectable in upper respiratory specimens during the acute phase of infection. The lowest concentration of SARS-CoV-2 viral copies this assay can detect is 138 copies/mL. A negative result does not preclude SARS-Cov-2 infection and should not be used as the sole basis for treatment or other patient management decisions. A negative result may occur with  improper specimen collection/handling, submission of specimen other than nasopharyngeal swab, presence of  viral mutation(s) within the areas targeted by this assay, and inadequate number of viral copies(<138 copies/mL). A negative result must be combined with clinical observations, patient history, and epidemiological information. The expected result is Negative.  Fact Sheet for Patients:  EntrepreneurPulse.com.au  Fact Sheet for Healthcare Providers:  IncredibleEmployment.be  This test is no t yet approved or cleared by the [place] FDA and  has been authorized for detection and/or diagnosis of SARS-CoV-2 by FDA under an Emergency Use Authorization (EUA). This EUA will remain  in effect (meaning this test can be used) for the duration of the COVID-19 declaration under Section 564(b)(1) of the Act, 21 U.S.C.section 360bbb-3(b)(1), unless the authorization is terminated  or revoked sooner.       Influenza A by PCR NEGATIVE NEGATIVE  Final   Influenza B by PCR NEGATIVE NEGATIVE Final    Comment: (NOTE) The Xpert Xpress SARS-CoV-2/FLU/RSV plus assay is intended as an aid in the diagnosis of influenza from Nasopharyngeal swab specimens and should not be used as a sole basis for treatment. Nasal washings and aspirates are unacceptable for Xpert Xpress SARS-CoV-2/FLU/RSV testing.  Fact Sheet for Patients: EntrepreneurPulse.com.au  Fact Sheet for Healthcare Providers: IncredibleEmployment.be  This test is not yet approved or cleared by the [place] FDA and has been authorized for detection and/or diagnosis of SARS-CoV-2 by FDA under an Emergency Use Authorization (EUA). This EUA will remain in effect (meaning this test can be used) for the duration of the COVID-19 declaration under Section 564(b)(1) of the Act, 21 U.S.C. section 360bbb-3(b)(1), unless the authorization is terminated or revoked.  Performed at [facility], [address] [place], [place] [address]   Blood culture (routine x 2)     Status: Abnormal (Preliminary result)   Collection Time: 06/29/21 10:14 AM   Specimen: BLOOD  Result Value Ref Range Status   Specimen Description   Final    BLOOD Performed at [facility], [address], [place], [place] [address]    Special Requests   Final    NONE Performed at [facility], [address], [place], [place] [address]    Culture  Setup Time   Final    GRAM POSITIVE COCCI aerobic bottle Gram Stain Report Called to,Read Back By and Verified With: Ferrell,S'@0458'$  by Zigmund Daniel, b 7.22.22 [facility] CRITICAL RESULT CALLED TO, READ BACK BY AND VERIFIED WITH: STEVEN HURTH PHARMD '@1010'$  06/30/21 EB GRAM POSITIVE COCCI ANAEROBIC BOTTLE ONLY Gram Stain Report Called to,Read Back By and Verified With: PREVIOUSLY CALLED TO FERRELL,S '@0458'$  06/30/21 BY bm Performed at [facility], [address] [place], [place] [address]    Culture STAPHYLOCOCCUS HOMINIS (A)  Final    Report Status PENDING  Incomplete  Blood Culture ID Panel (Reflexed)     Status: Abnormal   Collection Time: 06/29/21 10:14 AM  Result Value Ref Range Status   Enterococcus faecalis NOT DETECTED NOT DETECTED Final   Enterococcus Faecium NOT DETECTED NOT DETECTED Final   Listeria monocytogenes NOT DETECTED NOT DETECTED Final   Staphylococcus species DETECTED (A) NOT DETECTED Final    Comment: CRITICAL RESULT CALLED TO, READ BACK BY AND VERIFIED WITH: STEVEN HURTH PHARMD '@1010'$  06/30/21 EB     Staphylococcus aureus (BCID) NOT DETECTED NOT DETECTED Final   Staphylococcus epidermidis NOT DETECTED NOT DETECTED Final   Staphylococcus lugdunensis NOT DETECTED NOT DETECTED Final   Streptococcus species NOT DETECTED NOT DETECTED Final   Streptococcus agalactiae NOT DETECTED NOT DETECTED Final   Streptococcus pneumoniae NOT  DETECTED NOT DETECTED Final   Streptococcus pyogenes NOT DETECTED NOT DETECTED Final   A.calcoaceticus-baumannii NOT DETECTED NOT DETECTED Final   Bacteroides fragilis NOT DETECTED NOT DETECTED Final   Enterobacterales NOT DETECTED NOT DETECTED Final   Enterobacter cloacae complex NOT DETECTED NOT DETECTED Final   Escherichia coli NOT DETECTED NOT DETECTED Final   Klebsiella aerogenes NOT DETECTED NOT DETECTED Final   Klebsiella oxytoca NOT DETECTED NOT DETECTED Final   Klebsiella pneumoniae NOT DETECTED NOT DETECTED Final   Proteus species NOT DETECTED NOT DETECTED Final   Salmonella species NOT DETECTED NOT DETECTED Final   Serratia marcescens NOT DETECTED NOT DETECTED Final   Haemophilus influenzae NOT DETECTED NOT DETECTED Final   Neisseria meningitidis NOT DETECTED NOT DETECTED Final   Pseudomonas aeruginosa NOT DETECTED NOT DETECTED Final   Stenotrophomonas maltophilia NOT DETECTED NOT DETECTED Final   Candida albicans NOT DETECTED NOT DETECTED Final   Candida auris NOT DETECTED NOT DETECTED Final   Candida glabrata NOT DETECTED NOT DETECTED Final   Candida krusei  NOT DETECTED NOT DETECTED Final   Candida parapsilosis NOT DETECTED NOT DETECTED Final   Candida tropicalis NOT DETECTED NOT DETECTED Final   Cryptococcus neoformans/gattii NOT DETECTED NOT DETECTED Final    Comment: Performed at [facility], [address], [place], [place] [address]  Blood culture (routine x 2)     Status: None (Preliminary result)   Collection Time: 06/29/21 11:52 AM   Specimen: BLOOD  Result Value Ref Range Status   Specimen Description BLOOD  Final   Special Requests NONE  Final   Culture   Final    NO GROWTH 2 DAYS Performed at [facility], [address], [place], [place] [address]    Report Status PENDING  Incomplete     Radiology Studies: No results found.   Scheduled Meds:  heparin sodium (porcine)       albuterol  20 mg/hr Nebulization Once   aspirin EC  325 mg Oral Daily   calcitRIOL  0.75 mcg Oral Q T,Th,Sa-HD   cefdinir  300 mg Oral Q12H   Chlorhexidine Gluconate Cloth  6 each Topical Daily   Chlorhexidine Gluconate Cloth  6 each Topical Q0600   heparin  5,000 Units Subcutaneous Q8H   ipratropium  0.5 mg Nebulization Once   metoprolol tartrate  25 mg Oral BID   tamsulosin  0.4 mg Oral QHS   Continuous Infusions:  sodium chloride     sodium chloride       LOS: 2 days    Time spent: 35 minutes   Barton Dubois, MD [facility]   To contact the attending provider between 7A-7P or the covering provider during after hours 7P-7A, please log into the web site [email or web address] and access using universal [place] password for that web site. If you do not have the password, please call the hospital operator.  07/01/2021, 5:17 PM

## 2021-07-01 NOTE — Procedures (Signed)
   HEMODIALYSIS TREATMENT NOTE:  Uneventful 3.5 hour heparin-free HD completed via RIJ TDC. Entry site is unremarkable.  Goal met: 1 liter removed without interruption in UF.  All blood was returned.  No changes from pre-HD assessment.  Rockwell Alexandria, RN

## 2021-07-01 NOTE — Progress Notes (Signed)
Subjective:  Had HD yesterday afternoon- removed 2 liters  -  plan had been for another tx today to keep on reg schedule ?  Is on O2 but speaking in complete sentences and hoping to go home tomorrow   Objective Vital signs in last 24 hours: Vitals:   06/30/21 2209 07/01/21 0558 07/01/21 0600 07/01/21 0824  BP: 139/74 138/68  (!) 147/71  Pulse: 76 65  77  Resp: 18 18    Temp: 98.2 F (36.8 C) 97.8 F (36.6 C)    TempSrc: Oral Oral    SpO2: 95% 96%    Weight:   80.7 kg   Height:       Weight change: 4.588 kg  Intake/Output Summary (Last 24 hours) at 07/01/2021 1155 Last data filed at 07/01/2021 0900 Gross per 24 hour  Intake 949.31 ml  Output 2284 ml  Net -1334.69 ml    Dialysis Orders: Center: [facility]  on TTS . EDW 80kg HD Bath 2K/2.5Ca  Time 4:15 profile 4 Heparin none. Access RIJ TDC BFR 400 DFR 800    Calcitriol 0.75 mcg po/HD      Assessment/ Plan: Pt is a 70 y.o. yo male with ESRD along with many other medical problems who was admitted on 06/29/2021 with  SOB  Assessment/Plan: 1. SOB and hypoxia-  probably multifactorial-  COPD/CAP/volume-  is now on room air 2. ESRD - normally TTS at [facility]-  this week had Tuesday-  1/2 treatment on Thurs and yesterday (Friday) .   3. Anemia-  reasonable at 10-  no meds as of yet 4. Secondary hyperparathyroidism-  will cont calcitriol-  phos up -  no binder on OP med list  5. HTN/volume-  is at EDW- dont think volume is much of a component here  6.  1 out of 2 blood cx GPC-  thought to be contaminant- no WBC- no fevers so only on azithro for PNA   Renal will no physically see on Sunday but will revisit Monday-  call with questions   Louis Meckel    Labs: Basic Metabolic Panel: Recent Labs  Lab 06/29/21 1012 06/29/21 1014 06/30/21 0503 06/30/21 0849  NA 133*  --  136 135  K 3.7  --  5.2* 4.7  CL 93*  --  97* 97*  CO2 27  --  24 25  GLUCOSE 111*  --  138* 124*  BUN 31*  --  55* 57*  CREATININE 6.79*  --  9.90* 9.63*   CALCIUM 8.1*  --  8.1* 8.0*  PHOS  --  3.7  --  7.4*   Liver Function Tests: Recent Labs  Lab 06/29/21 1012 06/30/21 0849  AST 39  --   ALT 20  --   ALKPHOS 65  --   BILITOT 0.6  --   PROT 8.2*  --   ALBUMIN 3.5 2.8*   No results for input(s): LIPASE, AMYLASE in the last 168 hours. No results for input(s): AMMONIA in the last 168 hours. CBC: Recent Labs  Lab 06/29/21 1012 06/30/21 0503 06/30/21 0849  WBC 7.6 6.6 6.9  NEUTROABS 5.6  --   --   HGB 11.8* 9.7* 10.0*  HCT 35.1* 29.8* 29.8*  MCV 101.4* 104.6* 101.7*  PLT 171 161 156   Cardiac Enzymes: No results for input(s): CKTOTAL, CKMB, CKMBINDEX, TROPONINI in the last 168 hours. CBG: Recent Labs  Lab 06/29/21 2117  GLUCAP 147*    Iron Studies: No results for input(s): IRON, TIBC,  TRANSFERRIN, FERRITIN in the last 72 hours. Studies/Results: No results found. Medications: Infusions:  sodium chloride     sodium chloride     azithromycin 500 mg (06/30/21 1545)    Scheduled Medications:  albuterol  20 mg/hr Nebulization Once   aspirin EC  325 mg Oral Daily   calcitRIOL  0.75 mcg Oral Q T,Th,Sa-HD   Chlorhexidine Gluconate Cloth  6 each Topical Daily   Chlorhexidine Gluconate Cloth  6 each Topical Q0600   heparin  5,000 Units Subcutaneous Q8H   ipratropium  0.5 mg Nebulization Once   metoprolol tartrate  25 mg Oral BID   tamsulosin  0.4 mg Oral QHS    have reviewed scheduled and prn medications.  Physical Exam: General:  NAD-  eating lunch-  on O2 but speaking in complete sentences  Heart:RRR Lungs: poor air movement  Abdomen: thin, non tender Extremities: noedema  Dialysis Access: TDC clean and dry     07/01/2021,11:55 AM  LOS: 2 days

## 2021-07-02 MED ORDER — DOXYCYCLINE HYCLATE 100 MG PO TABS: 100.0000 mg | ORAL_TABLET | Freq: Two times a day (BID) | ORAL | 0 refills | Status: AC

## 2021-07-02 MED ORDER — FLUTICASONE FUROATE-VILANTEROL 200-25 MCG/INH IN AEPB: 1.0000 | INHALATION_SPRAY | Freq: Every day | RESPIRATORY_TRACT | 2 refills | Status: DC

## 2021-07-02 MED ORDER — ALBUTEROL SULFATE HFA 108 (90 BASE) MCG/ACT IN AERS: 2.0000 | INHALATION_SPRAY | Freq: Four times a day (QID) | RESPIRATORY_TRACT | 3 refills | Status: DC | PRN

## 2021-07-02 MED ORDER — SPIRIVA HANDIHALER 18 MCG IN CAPS: 18.0000 ug | ORAL_CAPSULE | Freq: Every day | RESPIRATORY_TRACT | 2 refills | Status: DC

## 2021-07-02 NOTE — Progress Notes (Signed)
Patient discharged home today, transported by niece home. Discharge paperwork went over with patient. Patient verbalized understanding. Belongings sent home with patient. Patient stable upon discharge.

## 2021-07-02 NOTE — Progress Notes (Signed)
SATURATION QUALIFICATIONS: (This note is used to comply with regulatory documentation for home oxygen)  Patient Saturations on Room Air at Rest = 98%  Patient Saturations on Room Air while Ambulating = 96%  Patient Saturations on  Liters of oxygen while Ambulating = %  Please briefly explain why patient needs home oxygen:

## 2021-07-02 NOTE — Discharge Summary (Signed)
Physician Discharge Summary  Linn Liechty [id number] DOB: [birth year] DOA: 06/29/2021  PCP: [name], FNP  Admit date: 06/29/2021 Discharge date: 07/02/2021  Time spent: 35 minutes  Recommendations for Outpatient Follow-up:  Repeat CBC to follow hemoglobin trend Repeat basic panel to follow generalized. Need referral to pulmonology service for PFTs and further adjustment in the treatment of his COPD. Please continue assisting patient with tobacco cessation. Repeat chest x-ray 8 to assure complete resolution of infiltrate in 6-8 weeks.   Discharge Diagnoses:  Principal Problem:   COPD with acute exacerbation ([place]) Active Problems:   Hypertension secondary to other renal disorders   Anemia due to chronic kidney disease   ESRD (end stage renal disease) ([place])   CAP (community acquired pneumonia)   Acute respiratory failure with hypoxia ([place])   Discharge Condition: Stable and improved.  Discharged home with instruction to follow-up with PCP and to be compliant with hemodialysis treatment as an outpatient.  CODE STATUS: DNR.  Diet recommendation: Heart healthy/renal diet.  Filed Weights   07/01/21 0600 07/01/21 1450 07/02/21 0507  Weight: 80.7 kg 80.7 kg 81.5 kg    History of present illness:   Kerry Underwood is a 70 y.o. male with medical history significant of ongoing tobacco abuse, renal disease, hypertension, history of TIA/stroke, COPD and anemia of chronic kidney disease; who presented to the emergency department from his dialysis center secondary to worsening shortness of breath and productive coughing spells.  Patient expressed some chills but had not checked his temperature at home.  No bloody sputum seen.  Denies chest pain.  Symptoms has been present for the last 3-4 days and worsening.  On 06/27/2021 he could not go to his dialysis as previously scheduled due to how bad he was feeling especially from the breathing standpoint with minimal exertion.  Patient found to  experience difficulty speaking in full sentences and diffuse wheezing appreciated. No nausea, no vomiting, no focal deficits or weakness.  Patient denies abdominal pain.   Of note, patient is now vaccinated against COVID and is not interested to be vaccinated.  Hospital Course:  1-acute respiratory failure with hypoxia secondary to COPD exacerbation and community-acquired pneumonia/bronchiectasis -Responded appropriately to treatment -Patient no febrile, speaking in full sentences and no requiring oxygen supplementation at discharge. -Will complete oral antibiotic therapy, steroids tapering and will follow recommendations for maintenance inhaler management along with rescue bronchodilators. -Outpatient follow-up with pulmonologist for PFTs and further adjustment of medications recommended. -Repeat chest x-ray in 6-8 weeks to assure complete resolution of infiltrates. -Patient has been encouraged to quit smoking.   2-Gram positive cocci in his blood cultures -Staph species identified; 1 out of 2 suggesting most likely contaminant -Patient improving and without any further fevers at this time -Patient will complete antibiotics for community-acquired pneumonia/bronchiectasis as mentioned above. -No treatment for bacteremia needed at this time.   3-hypertension secondary to other renal disorder -Continue current antihypertensive agents -Vital signs within normal limits currently.   4-end-stage renal disease -Appreciate nephrology service consultation and assistance in management -Continue hemodialysis treatment; with plans for back to schedule resumption on 07/01/2021.   5-BPH -Continue Flomax.   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 -Patient has declined nicotine patch.   8-anemia of chronic kidney disease -No overt bleeding  appreciated -Continue IV iron and Epogen as per renal discretion.  Procedures: See below for x-ray reports.  Consultations: Nephrology service  Discharge Exam: Vitals:   07/02/21 1321 07/02/21 1408  BP:  (!) 156/66  Pulse:  75  Resp:    Temp:  98.2 F (36.8 C)  SpO2: 95% 96%    General: No chest pain, no nausea, no vomiting.  Speaking in full sentences and currently no requiring oxygen supplementation.  Feeling ready to go home. Cardiovascular: S1 and S2, no rubs, no gallops, no JVD. Respiratory: Improved air movement bilaterally; still with positive expiratory wheezing and rhonchi.  No using accessory muscle.  No requiring oxygen supplementation. Abdomen: Soft, nontender, nondistended, positive bowel sounds Extremities: No cyanosis or clubbing.  Discharge Instructions   Discharge Instructions     Diet - low sodium heart healthy   Complete by: As directed    Discharge instructions   Complete by: As directed    Take medications as prescribed Stop smoking Maintain adequate hydration Arrange follow-up with PCP in 10 days Follow heart healthy/low-sodium diet.   No wound care   Complete by: As directed       Allergies as of 07/02/2021       Reactions   Strawberry (diagnostic)         Medication List     TAKE these medications    albuterol 108 (90 Base) MCG/ACT inhaler Commonly known as: VENTOLIN HFA Inhale 2 puffs into the lungs every 6 (six) hours as needed for wheezing or shortness of breath.   aspirin EC 325 MG tablet Take 325 mg by mouth daily.   calcium carbonate 1250 (500 Ca) MG tablet Commonly known as: OS-CAL - dosed in mg of elemental calcium Take 1 tablet (500 mg of elemental calcium total) by mouth at bedtime.   Dialyvite 800 0.8 MG Tabs Take 1 tablet by mouth at bedtime.   doxycycline 100 MG tablet Commonly known as: VIBRA-TABS Take 1 tablet (100 mg total) by mouth every 12 (twelve) hours for 4 days.   fluticasone furoate-vilanterol  200-25 MCG/INH Aepb Commonly known as: Breo Ellipta Inhale 1 puff into the lungs daily.   metoprolol tartrate 25 MG tablet Commonly known as: LOPRESSOR Take 1 tablet (25 mg total) by mouth 2 (two) times daily.   MIRCERA IJ Mircera   Spiriva HandiHaler 18 MCG inhalation capsule Generic drug: tiotropium Place 1 capsule (18 mcg total) into inhaler and inhale daily.   tamsulosin 0.4 MG Caps capsule Commonly known as: FLOMAX Take 0.4 mg by mouth at bedtime.       Allergies  Allergen Reactions   Strawberry (Diagnostic)     Follow-up Information     [name], FNP. Schedule an appointment as soon as possible for a visit in 10 day(s).   Specialty: Family Medicine Contact information: [place] [address] [phone]                  The results of significant diagnostics from this hospitalization (including imaging, microbiology, ancillary and laboratory) are listed below for reference.    Significant Diagnostic Studies: DG Chest Portable 1 View  Result Date: 06/29/2021 CLINICAL DATA:  Shortness of breath. EXAM: PORTABLE CHEST 1 VIEW COMPARISON:  11/22/2020 FINDINGS: The lungs are clear without focal pneumonia, edema, pneumothorax or pleural effusion. Similar patchy opacity at the left base is substantially decreased since chest x-ray of 09/17/2020 and may reflect chronic atelectasis or scarring. Infection not entirely excluded but considered less likely. The cardiopericardial silhouette is within normal limits for size. Right IJ central line tip overlies the SVC/RA junction. Old left  clavicle fracture again noted. Telemetry leads overlie the chest. IMPRESSION: Persistent patchy opacity at the left base, likely chronic although component of airspace infection cannot be excluded. Electronically Signed   By: Misty Stanley M.D.   On: 06/29/2021 11:19    Microbiology: Recent Results (from the past 240 hour(s))  Resp Panel by RT-PCR (Flu A&B, Covid)  Nasopharyngeal Swab     Status: None   Collection Time: 06/29/21  9:58 AM   Specimen: Nasopharyngeal Swab; Nasopharyngeal(NP) swabs in vial transport medium  Result Value Ref Range Status   SARS Coronavirus 2 by RT PCR NEGATIVE NEGATIVE Final    Comment: (NOTE) SARS-CoV-2 target nucleic acids are NOT DETECTED.  The SARS-CoV-2 RNA is generally detectable in upper respiratory specimens during the acute phase of infection. The lowest concentration of SARS-CoV-2 viral copies this assay can detect is 138 copies/mL. A negative result does not preclude SARS-Cov-2 infection and should not be used as the sole basis for treatment or other patient management decisions. A negative result may occur with  improper specimen collection/handling, submission of specimen other than nasopharyngeal swab, presence of viral mutation(s) within the areas targeted by this assay, and inadequate number of viral copies(<138 copies/mL). A negative result must be combined with clinical observations, patient history, and epidemiological information. The expected result is Negative.  Fact Sheet for Patients:  EntrepreneurPulse.com.au  Fact Sheet for Healthcare Providers:  IncredibleEmployment.be  This test is no t yet approved or cleared by the [place] FDA and  has been authorized for detection and/or diagnosis of SARS-CoV-2 by FDA under an Emergency Use Authorization (EUA). This EUA will remain  in effect (meaning this test can be used) for the duration of the COVID-19 declaration under Section 564(b)(1) of the Act, 21 U.S.C.section 360bbb-3(b)(1), unless the authorization is terminated  or revoked sooner.       Influenza A by PCR NEGATIVE NEGATIVE Final   Influenza B by PCR NEGATIVE NEGATIVE Final    Comment: (NOTE) The Xpert Xpress SARS-CoV-2/FLU/RSV plus assay is intended as an aid in the diagnosis of influenza from Nasopharyngeal swab specimens and should not be  used as a sole basis for treatment. Nasal washings and aspirates are unacceptable for Xpert Xpress SARS-CoV-2/FLU/RSV testing.  Fact Sheet for Patients: EntrepreneurPulse.com.au  Fact Sheet for Healthcare Providers: IncredibleEmployment.be  This test is not yet approved or cleared by the [place] FDA and has been authorized for detection and/or diagnosis of SARS-CoV-2 by FDA under an Emergency Use Authorization (EUA). This EUA will remain in effect (meaning this test can be used) for the duration of the COVID-19 declaration under Section 564(b)(1) of the Act, 21 U.S.C. section 360bbb-3(b)(1), unless the authorization is terminated or revoked.  Performed at [facility], [address] [place], [place] [address]   Blood culture (routine x 2)     Status: Abnormal (Preliminary result)   Collection Time: 06/29/21 10:14 AM   Specimen: BLOOD  Result Value Ref Range Status   Specimen Description   Final    BLOOD Performed at [facility], [address], [place], [place] [address]    Special Requests   Final    NONE Performed at [facility], [address] [place], [place] [address]    Culture  Setup Time   Final    GRAM POSITIVE COCCI aerobic bottle Gram Stain Report Called to,Read Back By and Verified With: Ferrell,S'@0458'$  by Zigmund Daniel, b 7.22.22 [place] [facility] CRITICAL RESULT CALLED TO, READ BACK BY AND VERIFIED WITH: STEVEN  HURTH PHARMD '@1010'$  06/30/21 EB GRAM POSITIVE COCCI ANAEROBIC BOTTLE ONLY Gram Stain Report Called to,Read Back By and Verified With: PREVIOUSLY CALLED TO FERRELL,S '@0458'$  06/30/21 BY bm Performed at [facility], [address] [place], [place] [address]    Culture (A)  Final    STAPHYLOCOCCUS HOMINIS THE SIGNIFICANCE OF ISOLATING THIS ORGANISM FROM A SINGLE SET OF BLOOD CULTURES WHEN MULTIPLE SETS ARE DRAWN IS UNCERTAIN. PLEASE NOTIFY THE MICROBIOLOGY DEPARTMENT WITHIN ONE WEEK IF SPECIATION AND SENSITIVITIES ARE  REQUIRED. Performed at [facility], [place] [address] [place], [place] [address]    Report Status PENDING  Incomplete  Blood Culture ID Panel (Reflexed)     Status: Abnormal   Collection Time: 06/29/21 10:14 AM  Result Value Ref Range Status   Enterococcus faecalis NOT DETECTED NOT DETECTED Final   Enterococcus Faecium NOT DETECTED NOT DETECTED Final   Listeria monocytogenes NOT DETECTED NOT DETECTED Final   Staphylococcus species DETECTED (A) NOT DETECTED Final    Comment: CRITICAL RESULT CALLED TO, READ BACK BY AND VERIFIED WITH: STEVEN HURTH PHARMD '@1010'$  06/30/21 EB     Staphylococcus aureus (BCID) NOT DETECTED NOT DETECTED Final   Staphylococcus epidermidis NOT DETECTED NOT DETECTED Final   Staphylococcus lugdunensis NOT DETECTED NOT DETECTED Final   Streptococcus species NOT DETECTED NOT DETECTED Final   Streptococcus agalactiae NOT DETECTED NOT DETECTED Final   Streptococcus pneumoniae NOT DETECTED NOT DETECTED Final   Streptococcus pyogenes NOT DETECTED NOT DETECTED Final   A.calcoaceticus-baumannii NOT DETECTED NOT DETECTED Final   Bacteroides fragilis NOT DETECTED NOT DETECTED Final   Enterobacterales NOT DETECTED NOT DETECTED Final   Enterobacter cloacae complex NOT DETECTED NOT DETECTED Final   Escherichia coli NOT DETECTED NOT DETECTED Final   Klebsiella aerogenes NOT DETECTED NOT DETECTED Final   Klebsiella oxytoca NOT DETECTED NOT DETECTED Final   Klebsiella pneumoniae NOT DETECTED NOT DETECTED Final   Proteus species NOT DETECTED NOT DETECTED Final   Salmonella species NOT DETECTED NOT DETECTED Final   Serratia marcescens NOT DETECTED NOT DETECTED Final   Haemophilus influenzae NOT DETECTED NOT DETECTED Final   Neisseria meningitidis NOT DETECTED NOT DETECTED Final   Pseudomonas aeruginosa NOT DETECTED NOT DETECTED Final   Stenotrophomonas maltophilia NOT DETECTED NOT DETECTED Final   Candida albicans NOT DETECTED NOT DETECTED Final   Candida auris NOT DETECTED  NOT DETECTED Final   Candida glabrata NOT DETECTED NOT DETECTED Final   Candida krusei NOT DETECTED NOT DETECTED Final   Candida parapsilosis NOT DETECTED NOT DETECTED Final   Candida tropicalis NOT DETECTED NOT DETECTED Final   Cryptococcus neoformans/gattii NOT DETECTED NOT DETECTED Final    Comment: Performed at [facility], [address] [place], [place] [address]  Blood culture (routine x 2)     Status: None (Preliminary result)   Collection Time: 06/29/21 11:52 AM   Specimen: BLOOD  Result Value Ref Range Status   Specimen Description BLOOD  Final   Special Requests NONE  Final   Culture   Final    NO GROWTH 3 DAYS Performed at [facility], [address] [place], [place] [address]    Report Status PENDING  Incomplete     Labs: Basic Metabolic Panel: Recent Labs  Lab 06/29/21 1012 06/29/21 1014 06/30/21 0503 06/30/21 0849 07/01/21 1511  NA 133*  --  136 135 131*  K 3.7  --  5.2* 4.7 3.9  CL 93*  --  97* 97* 91*  CO2 27  --  $'24 25 26  'J$ GLUCOSE 111*  --  138* 124* 104*  BUN 31*  --  55* 57* 49*  CREATININE 6.79*  --  9.90* 9.63* 6.51*  CALCIUM 8.1*  --  8.1* 8.0* 7.7*  MG  --  2.0  --   --   --   PHOS  --  3.7  --  7.4* 5.9*   Liver Function Tests: Recent Labs  Lab 06/29/21 1012 06/30/21 0849 07/01/21 1511  AST 39  --   --   ALT 20  --   --   ALKPHOS 65  --   --   BILITOT 0.6  --   --   PROT 8.2*  --   --   ALBUMIN 3.5 2.8* 2.7*   No results for input(s): LIPASE, AMYLASE in the last 168 hours. No results for input(s): AMMONIA in the last 168 hours. CBC: Recent Labs  Lab 06/29/21 1012 06/30/21 0503 06/30/21 0849 07/01/21 1511  WBC 7.6 6.6 6.9 8.2  NEUTROABS 5.6  --   --   --   HGB 11.8* 9.7* 10.0* 9.7*  HCT 35.1* 29.8* 29.8* 28.6*  MCV 101.4* 104.6* 101.7* 101.1*  PLT 171 161 156 188    BNP (last 3 results) Recent Labs    06/29/21 1012  BNP 287.0*    CBG: Recent Labs  Lab 06/29/21 2117  GLUCAP 147*    Signed:  Barton Dubois MD.  [facility] 07/02/2021, 2:37 PM

## 2021-07-03 LAB — CULTURE, BLOOD (ROUTINE X 2)

## 2021-07-04 LAB — CULTURE, BLOOD (ROUTINE X 2): Culture: NO GROWTH

## 2021-07-05 ENCOUNTER — Telehealth: Payer: Self-pay | Admitting: *Deleted

## 2021-07-05 NOTE — Telephone Encounter (Signed)
"  Not eligible for TCM as he has End Stage Renal Disease, but needs regular hospital f/u with [facility] as he was in [facility] for a COPD exacerbation and discharged 7/24 - thanks"   I contacted patients niece, Genia Plants, per signed DPR.  An appointment was scheduled for august 3rd, Delight Ovens, NP at 10:05 am

## 2021-07-12 ENCOUNTER — Other Ambulatory Visit: Payer: Self-pay

## 2021-07-12 ENCOUNTER — Ambulatory Visit (INDEPENDENT_AMBULATORY_CARE_PROVIDER_SITE_OTHER): Payer: 59 | Admitting: Family Medicine

## 2021-07-12 ENCOUNTER — Encounter: Payer: Self-pay | Admitting: Family Medicine

## 2021-07-12 VITALS — BP 121/69 | HR 86 | Temp 98.7°F | Ht 65.0 in | Wt 184.4 lb

## 2021-07-12 DIAGNOSIS — Z09 Encounter for follow-up examination after completed treatment for conditions other than malignant neoplasm: Secondary | ICD-10-CM | POA: Diagnosis not present

## 2021-07-12 DIAGNOSIS — Z8701 Personal history of pneumonia (recurrent): Secondary | ICD-10-CM

## 2021-07-12 DIAGNOSIS — I151 Hypertension secondary to other renal disorders: Secondary | ICD-10-CM

## 2021-07-12 DIAGNOSIS — N2889 Other specified disorders of kidney and ureter: Secondary | ICD-10-CM

## 2021-07-12 DIAGNOSIS — J449 Chronic obstructive pulmonary disease, unspecified: Secondary | ICD-10-CM

## 2021-07-12 MED ORDER — METOPROLOL TARTRATE 25 MG PO TABS: 25.0000 mg | ORAL_TABLET | Freq: Two times a day (BID) | ORAL | 1 refills | Status: DC

## 2021-07-12 NOTE — Progress Notes (Signed)
Assessment & Plan:  1. Hospital discharge follow-up Feeling much better.  2. History of community acquired pneumonia Patient to return for repeat chest x-ray to ensure resolution in ~6 weeks. - DG Chest 2 View; Future  3. Chronic obstructive pulmonary disease, unspecified COPD type (HCC) Continue Spiriva daily and albuterol as needed.  4. Hypertension secondary to other renal disorders - metoprolol tartrate (LOPRESSOR) 25 MG tablet; Take 1 tablet (25 mg total) by mouth 2 (two) times daily.  Dispense: 180 tablet; Refill: 1   Return in about 6 weeks (around 08/23/2021) for annual physical.  [name], MSN, APRN, FNP-C [facility]  Subjective:    Patient ID: Kerry Underwood, male    DOB: [birth year], 70 y.o.   MRN: [id number]  Patient Care Team: [name], FNP as PCP - General ([facility]   Chief Complaint:  Chief Complaint  Patient presents with   Transitions Of Care    7/21 AP- COPD exacerbation     HPI: Kerry Underwood is a 70 y.o. male presenting on 07/12/2021 for Transitions Of Care (7/21 AP- COPD exacerbation )  Patient was admitted to [facility] 06/29/2021-07/02/2021 due to worsening shortness of breath and cough. He was diagnosed with acute respiratory failure with hypoxia secondary to COPD exacerbation and CAP/bronchiectasis. Treated with steroids and antibiotics. Outpatient follow-up with pulmonology for PFTs was recommended, as well as a repeat chest x-ray in 6-8 weeks. Patient does smoke and cessation was encouraged.  He reports today he is feeling great. He is using his Spiriva inhaler daily as prescribed. He occasionally uses Breo. He only uses Albuterol once in the morning to get him opened up for the day.  New complaints: None   Social history:  Relevant past medical, surgical, family and social history reviewed and updated as indicated. Interim medical history since our last visit  reviewed.  Allergies and medications reviewed and updated.  DATA REVIEWED: CHART IN EPIC  ROS: Negative unless specifically indicated above in HPI.    Current Outpatient Medications:    albuterol (VENTOLIN HFA) 108 (90 Base) MCG/ACT inhaler, Inhale 2 puffs into the lungs every 6 (six) hours as needed for wheezing or shortness of breath., Disp: 18 g, Rfl: 3   aspirin EC 325 MG tablet, Take 325 mg by mouth daily., Disp: , Rfl:    fluticasone furoate-vilanterol (BREO ELLIPTA) 200-25 MCG/INH AEPB, Inhale 1 puff into the lungs daily., Disp: 60 each, Rfl: 2   tamsulosin (FLOMAX) 0.4 MG CAPS capsule, Take 0.4 mg by mouth at bedtime., Disp: , Rfl:    tiotropium (SPIRIVA HANDIHALER) 18 MCG inhalation capsule, Place 1 capsule (18 mcg total) into inhaler and inhale daily., Disp: 30 capsule, Rfl: 2   metoprolol tartrate (LOPRESSOR) 25 MG tablet, Take 1 tablet (25 mg total) by mouth 2 (two) times daily., Disp: 180 tablet, Rfl: 1   Allergies  Allergen Reactions   Strawberry (Diagnostic)    Past Medical History:  Diagnosis Date   Anemia    CHF (congestive heart failure) (HCC)    COPD (chronic obstructive pulmonary disease) ([place])    Full dentures    Hypertension    Pyelonephritis 09/11/2017   Renal disorder    Stroke [facility])    " A few mini strokes, I didn't know it "    Past Surgical History:  Procedure Laterality Date   BASCILIC VEIN TRANSPOSITION Right 04/19/2020   Procedure: Basilic Vein Transposition Right Arm;  Surgeon: Waynetta Sandy, MD;  Location: [facility] OR;  Service: Vascular;  Laterality: Right;   INSERTION OF DIALYSIS CATHETER Right 04/19/2020   Procedure: Insertion Of Right Internal Jugular Dialysis Catheter;  Surgeon: Waynetta Sandy, MD;  Location: [facility] OR;  Service: Vascular;  Laterality: Right;   MULTIPLE TOOTH EXTRACTIONS      Social History   Socioeconomic History   Marital status: Single    Spouse name: Not on file   Number of children: Not on file   Years  of education: Not on file   Highest education level: Not on file  Occupational History   Not on file  Tobacco Use   Smoking status: Every Day    Packs/day: 0.50    Types: Cigarettes   Smokeless tobacco: Former    Types: Nurse, children's Use: Never used  Substance and Sexual Activity   Alcohol use: No   Drug use: Not Currently    Types: Marijuana   Sexual activity: Not Currently    Birth control/protection: None  Other Topics Concern   Not on file  Social History Narrative   Not on file   Social Determinants of Health   Financial Resource Strain: Not on file  Food Insecurity: Not on file  Transportation Needs: Not on file  Physical Activity: Not on file  Stress: Not on file  Social Connections: Not on file  Intimate Partner Violence: Not on file        Objective:    BP 121/69   Pulse 86   Temp 98.7 F (37.1 C) (Temporal)   Ht '5\' 5"'$  (1.651 m)   Wt 184 lb 6.4 oz (83.6 kg)   SpO2 93%   BMI 30.69 kg/m   Wt Readings from Last 3 Encounters:  07/12/21 184 lb 6.4 oz (83.6 kg)  07/02/21 179 lb 10.8 oz (81.5 kg)  11/24/20 171 lb 8.3 oz (77.8 kg)    Physical Exam Vitals reviewed.  Constitutional:      General: He is not in acute distress.    Appearance: Normal appearance. He is obese. He is not ill-appearing, toxic-appearing or diaphoretic.  HENT:     Head: Normocephalic and atraumatic.  Eyes:     General: No scleral icterus.       Right eye: No discharge.        Left eye: No discharge.     Conjunctiva/sclera: Conjunctivae normal.  Cardiovascular:     Rate and Rhythm: Normal rate and regular rhythm.     Heart sounds: Normal heart sounds. No murmur heard.   No friction rub. No gallop.  Pulmonary:     Effort: Pulmonary effort is normal. No respiratory distress.     Breath sounds: No stridor. Wheezing present. No rhonchi or rales.  Musculoskeletal:        General: Normal range of motion.     Cervical back: Normal range of motion.  Skin:     General: Skin is warm and dry.  Neurological:     Mental Status: He is alert and oriented to person, place, and time. Mental status is at baseline.  Psychiatric:        Mood and Affect: Mood normal.        Behavior: Behavior normal.        Thought Content: Thought content normal.        Judgment: Judgment normal.    No results found for: TSH Lab Results  Component Value Date   WBC 8.2 07/01/2021   HGB 9.7 (  L) 07/01/2021   HCT 28.6 (L) 07/01/2021   MCV 101.1 (H) 07/01/2021   PLT 188 07/01/2021   Lab Results  Component Value Date   NA 131 (L) 07/01/2021   K 3.9 07/01/2021   CO2 26 07/01/2021   GLUCOSE 104 (H) 07/01/2021   BUN 49 (H) 07/01/2021   CREATININE 6.51 (H) 07/01/2021   BILITOT 0.6 06/29/2021   ALKPHOS 65 06/29/2021   AST 39 06/29/2021   ALT 20 06/29/2021   PROT 8.2 (H) 06/29/2021   ALBUMIN 2.7 (L) 07/01/2021   CALCIUM 7.7 (L) 07/01/2021   ANIONGAP 14 07/01/2021   No results found for: CHOL No results found for: HDL No results found for: LDLCALC No results found for: TRIG No results found for: CHOLHDL No results found for: HGBA1C

## 2021-07-12 NOTE — Patient Instructions (Signed)
Come on/after 08/16/2021 for your repeat chest x-ray.

## 2021-08-04 IMAGING — DX DG CHEST 1V PORT
1 series · 1 of 1 positions shown · non-contrast
Comparison: 09/17/2020

CLINICAL DATA: Shortness of breath and fever.

EXAM:
PORTABLE CHEST 1 VIEW

[chest ap]
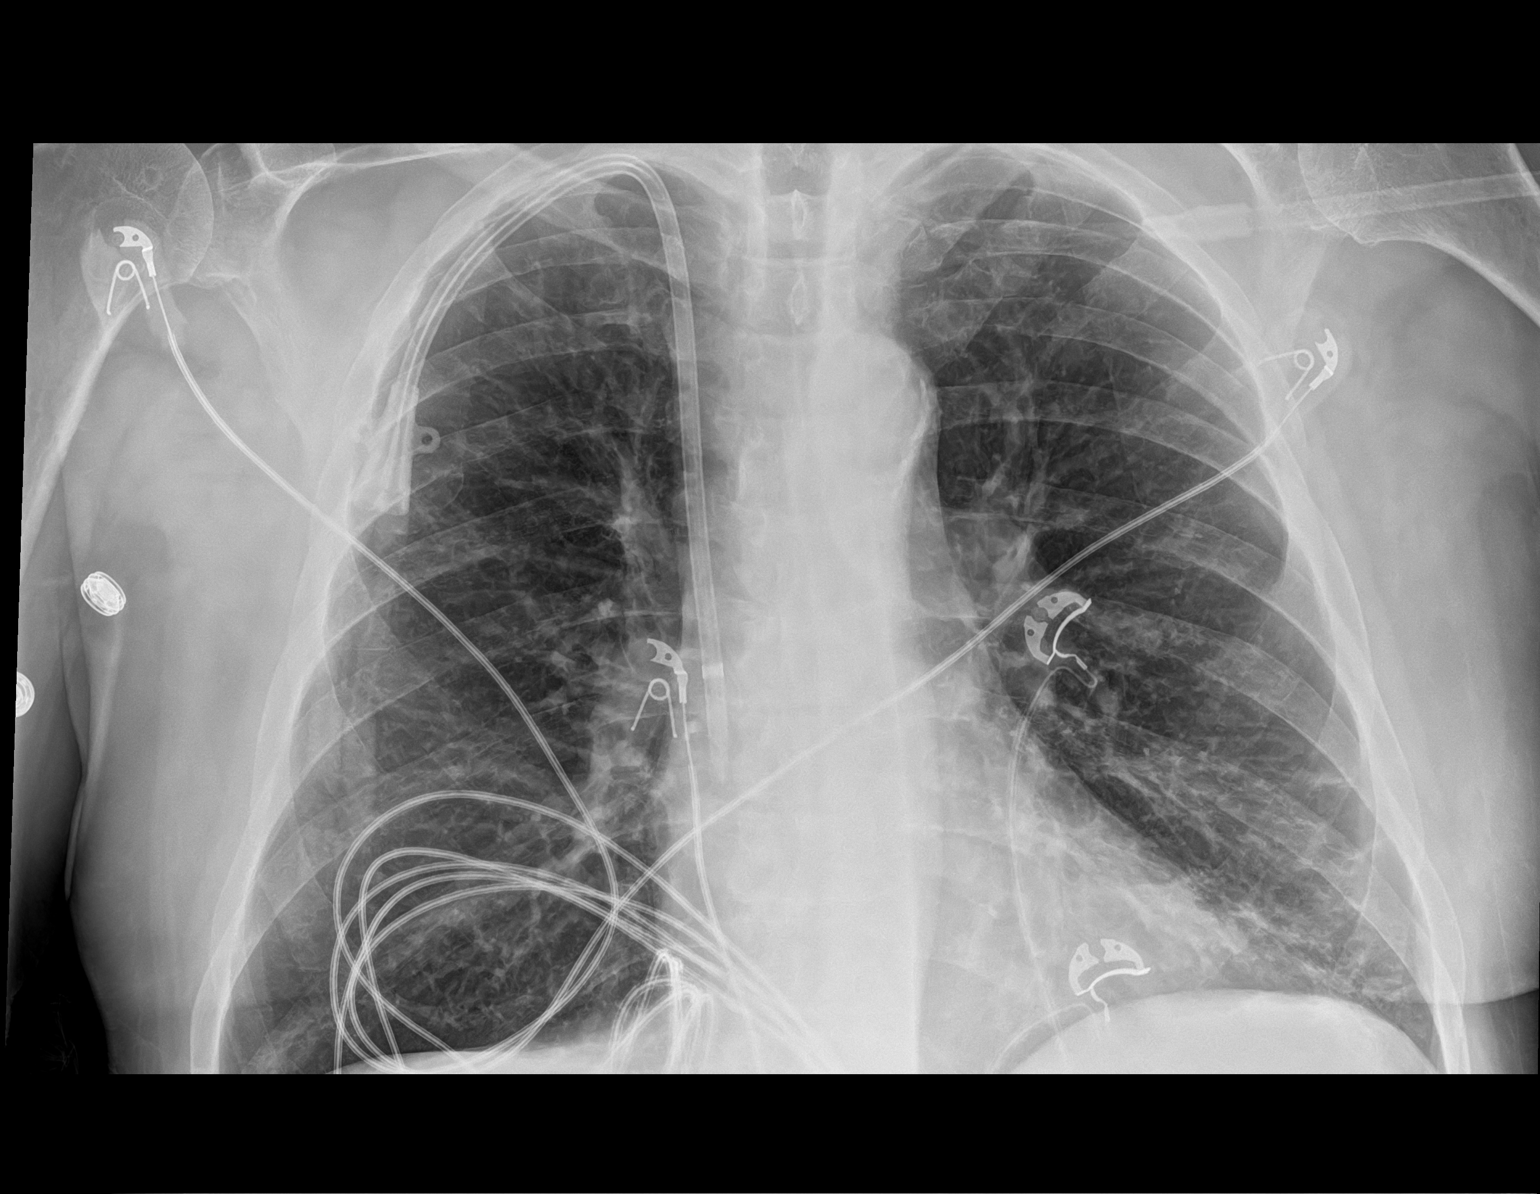

[1 of 1 positions shown; findings below may reference images not displayed]

FINDINGS: The heart size is normal. Stable appearance of a tunneled right
jugular dialysis catheter terminating at the SVC/RA junction. Lungs
demonstrate a component of chronic disease as well as some increased
bronchial and interstitial prominence in both lungs. A component of
active bronchitis or interstitial pneumonia cannot be excluded. No
overt pulmonary edema, pneumothorax or pleural fluid identified.
IMPRESSION: Increased bronchial and interstitial prominence in both lungs. A
component of active bronchitis or interstitial pneumonia cannot be
excluded.

## 2021-08-23 ENCOUNTER — Ambulatory Visit (INDEPENDENT_AMBULATORY_CARE_PROVIDER_SITE_OTHER): Payer: 59 | Admitting: Family Medicine

## 2021-08-23 ENCOUNTER — Other Ambulatory Visit: Payer: Self-pay

## 2021-08-23 ENCOUNTER — Encounter: Payer: Self-pay | Admitting: Family Medicine

## 2021-08-23 ENCOUNTER — Ambulatory Visit (INDEPENDENT_AMBULATORY_CARE_PROVIDER_SITE_OTHER): Payer: 59

## 2021-08-23 VITALS — BP 160/80 | HR 89 | Temp 98.4°F | Ht 65.0 in | Wt 189.8 lb

## 2021-08-23 DIAGNOSIS — E039 Hypothyroidism, unspecified: Secondary | ICD-10-CM

## 2021-08-23 DIAGNOSIS — J449 Chronic obstructive pulmonary disease, unspecified: Secondary | ICD-10-CM

## 2021-08-23 DIAGNOSIS — Z8701 Personal history of pneumonia (recurrent): Secondary | ICD-10-CM

## 2021-08-23 DIAGNOSIS — E559 Vitamin D deficiency, unspecified: Secondary | ICD-10-CM

## 2021-08-23 DIAGNOSIS — Z0001 Encounter for general adult medical examination with abnormal findings: Secondary | ICD-10-CM

## 2021-08-23 DIAGNOSIS — N186 End stage renal disease: Secondary | ICD-10-CM

## 2021-08-23 DIAGNOSIS — I5032 Chronic diastolic (congestive) heart failure: Secondary | ICD-10-CM | POA: Diagnosis not present

## 2021-08-23 DIAGNOSIS — Z0000 Encounter for general adult medical examination without abnormal findings: Secondary | ICD-10-CM

## 2021-08-23 NOTE — Progress Notes (Signed)
Assessment & Plan:  1. Well adult exam - preventative health information provided - encouraged to obtain eye exam as he is wearing someone else's glasses - CMP14+EGFR - Lipid panel - TSH  2. Chronic diastolic heart failure (HCC) - continue to take metoprolol as prescribed - encouraged healthy diet and exercise - CMP14+EGFR - Lipid panel  3. Chronic obstructive pulmonary disease, unspecified COPD type ([place]) - encouraged to take medications as prescribed  4. Hypothyroidism, unspecified type - not on medication, will check TSH - TSH  5. ESRD (end stage renal disease) ([place]) - followed by Nephrology - HD T/TR/S  6. Vitamin D deficiency - continue to take Vitamin D supplement - Cholecalciferol (VITAMIN D3 PO); Take by mouth daily. - VITAMIN D 25 Hydroxy (Vit-D Deficiency, Fractures)  7. History of recent pneumonia - recheck CXR to ensure clearance of pneumonia   Follow-up: Return in about 6 months (around 02/20/2022) for follow-up of chronic medication conditions.   Lucile Crater, NP Student  I personally was present during the history, physical exam, and medical decision-making activities of this service and have verified that the service and findings are accurately documented in the nurse practitioner student's note.  [name], MSN, APRN, FNP-C [facility]   Subjective:  Patient ID: Kerry Underwood, male    DOB: 23-Jul-1951  Age: 70 y.o. MRN: [id number]  Patient Care Team: [name], FNP as PCP - General [facility]   CC:  Chief Complaint  Patient presents with   Annual Exam    HPI Kerry Underwood presents for annual physical.   He gets HD T/TR/S for 4 hour sessions. His predialysis BP is usually 160s/50s and his post dialysis BP is usually 70s/30s. When he takes his BP at home in between HD sessions, his BP is 119/54. He does not take his inhalers for his COPD as prescribed. He states he does not use  the albuterol much if at all and only uses the spiriva or breo occasionally in the mornings, especially the day after dialysis. He is scheduled to receive a revision to his AV fistula by vascular surgery within the next few months, but has a tunneled subclavian access in the interim.   Occupation: retired, Marital status: single, Substance use: none actively, marijuana in the past. Current 0.5 ppd smoker Diet: regular, Exercise: horseback riding Last eye exam: never, wearing someone else's glasses. He states he can't afford an eye exam and glasses. Last dental exam: dentures Last colonoscopy: declined Lung Cancer Screening with low-dose Chest CT: declined AAA Screening: declined Hepatitis C Screening: completed PSA: never Immunizations: Flu Vaccine: declined Tdap Vaccine: up to date  Shingrix Vaccine: declined  COVID-19 Vaccine: declined Pneumonia Vaccine: declined  Advanced Directives Patient does have advanced directives including living will. He does not have a copy in the electronic medical record.   DEPRESSION SCREENING PHQ 2/9 Scores 08/23/2021 05/11/2020  PHQ - 2 Score 0 0  PHQ- 9 Score 0 -     Review of Systems  Constitutional:  Positive for malaise/fatigue (after dialysis). Negative for chills, fever and weight loss.  HENT:  Negative for congestion, ear discharge, ear pain, hearing loss and tinnitus.   Eyes:  Negative for blurred vision, double vision, pain, discharge and redness.  Respiratory:  Positive for shortness of breath (sometimes in the mornings). Negative for cough, sputum production and wheezing.   Cardiovascular:  Negative for chest pain, palpitations and leg swelling.  Gastrointestinal:  Negative for abdominal pain,  constipation, heartburn and nausea.  Genitourinary:  Negative for dysuria and urgency.  Musculoskeletal:  Negative for back pain, falls, joint pain, myalgias and neck pain.  Skin:  Negative for itching and rash.  Neurological:  Negative for  dizziness, tingling, weakness and headaches.  Psychiatric/Behavioral:  Negative for depression, substance abuse and suicidal ideas. The patient is not nervous/anxious and does not have insomnia.     Current Outpatient Medications:    aspirin EC 325 MG tablet, Take 325 mg by mouth daily., Disp: , Rfl:    B Complex-C-Folic Acid (DIALYVITE 005 PO), Take 0.8 mg by mouth at bedtime., Disp: , Rfl:    Calcium Carbonate 500 MG CHEW, Chew 500 mg by mouth at bedtime., Disp: , Rfl:    Cholecalciferol (VITAMIN D3 PO), Take by mouth daily., Disp: , Rfl:    fluticasone furoate-vilanterol (BREO ELLIPTA) 200-25 MCG/INH AEPB, Inhale 1 puff into the lungs daily., Disp: 60 each, Rfl: 2   sucroferric oxyhydroxide (VELPHORO) 500 MG chewable tablet, Chew 1,000 mg by mouth 3 (three) times daily with meals., Disp: , Rfl:    tamsulosin (FLOMAX) 0.4 MG CAPS capsule, Take 0.4 mg by mouth at bedtime., Disp: , Rfl:    albuterol (VENTOLIN HFA) 108 (90 Base) MCG/ACT inhaler, Inhale 2 puffs into the lungs every 6 (six) hours as needed for wheezing or shortness of breath., Disp: 18 g, Rfl: 3   metoprolol tartrate (LOPRESSOR) 25 MG tablet, Take 1 tablet (25 mg total) by mouth 2 (two) times daily. (Patient taking differently: Take 25 mg by mouth daily at 2 PM.), Disp: 180 tablet, Rfl: 1   tiotropium (SPIRIVA HANDIHALER) 18 MCG inhalation capsule, Place 1 capsule (18 mcg total) into inhaler and inhale daily., Disp: 30 capsule, Rfl: 2  Allergies  Allergen Reactions   Strawberry (Diagnostic)     Past Medical History:  Diagnosis Date   Anemia    CHF (congestive heart failure) (HCC)    COPD (chronic obstructive pulmonary disease) ([place])    Full dentures    Hypertension    Pyelonephritis 09/11/2017   Renal disorder    Stroke [facility])    " A few mini strokes, I didn't know it "    Past Surgical History:  Procedure Laterality Date   BASCILIC VEIN TRANSPOSITION Right 04/19/2020   Procedure: Basilic Vein Transposition Right Arm;   Surgeon: Waynetta Sandy, MD;  Location: [place];  Service: Vascular;  Laterality: Right;   INSERTION OF DIALYSIS CATHETER Right 04/19/2020   Procedure: Insertion Of Right Internal Jugular Dialysis Catheter;  Surgeon: Waynetta Sandy, MD;  Location: [facility] OR;  Service: Vascular;  Laterality: Right;   MULTIPLE TOOTH EXTRACTIONS      Family History  Problem Relation Age of Onset   Alzheimer's disease Mother     Social History   Socioeconomic History   Marital status: Single    Spouse name: Not on file   Number of children: Not on file   Years of education: Not on file   Highest education level: Not on file  Occupational History   Not on file  Tobacco Use   Smoking status: Every Day    Packs/day: 0.50    Types: Cigarettes   Smokeless tobacco: Former    Types: Nurse, children's Use: Never used  Substance and Sexual Activity   Alcohol use: No   Drug use: Not Currently    Types: Marijuana   Sexual activity: Not Currently    Birth control/protection:  None  Other Topics Concern   Not on file  Social History Narrative   Not on file   Social Determinants of Health   Financial Resource Strain: Not on file  Food Insecurity: Not on file  Transportation Needs: Not on file  Physical Activity: Not on file  Stress: Not on file  Social Connections: Not on file  Intimate Partner Violence: Not on file      Objective:    BP (!) 160/80   Pulse 89   Temp 98.4 F (36.9 C) (Temporal)   Ht 5' 5"  (1.651 m)   Wt 86.1 kg   SpO2 99%   BMI 31.58 kg/m   Wt Readings from Last 3 Encounters:  08/23/21 189 lb 12.8 oz (86.1 kg)  07/12/21 184 lb 6.4 oz (83.6 kg)  07/02/21 179 lb 10.8 oz (81.5 kg)    Physical Exam Vitals reviewed.  Constitutional:      General: He is not in acute distress.    Appearance: Normal appearance. He is obese. He is not ill-appearing, toxic-appearing or diaphoretic.  HENT:     Head: Normocephalic and atraumatic.     Right Ear:  Tympanic membrane, ear canal and external ear normal.     Left Ear: Tympanic membrane, ear canal and external ear normal.     Nose: Nose normal. No congestion or rhinorrhea.     Mouth/Throat:     Mouth: Mucous membranes are moist.     Pharynx: Oropharynx is clear. No posterior oropharyngeal erythema.  Eyes:     Extraocular Movements: Extraocular movements intact.     Pupils: Pupils are equal, round, and reactive to light.  Cardiovascular:     Rate and Rhythm: Normal rate and regular rhythm.     Pulses: Normal pulses.     Heart sounds: Normal heart sounds.  Pulmonary:     Effort: Pulmonary effort is normal.     Breath sounds: Normal breath sounds.  Abdominal:     General: Bowel sounds are normal. There is no distension.     Palpations: Abdomen is soft. There is no mass.     Tenderness: There is no abdominal tenderness.  Musculoskeletal:        General: No swelling or tenderness. Normal range of motion.     Cervical back: Normal range of motion.  Skin:    General: Skin is warm and dry.     Capillary Refill: Capillary refill takes less than 2 seconds.  Neurological:     General: No focal deficit present.     Mental Status: He is alert and oriented to person, place, and time. Mental status is at baseline.     Motor: No weakness.     Gait: Gait normal.  Psychiatric:        Mood and Affect: Mood normal.        Behavior: Behavior normal.        Thought Content: Thought content normal.        Judgment: Judgment normal.    No results found for: TSH Lab Results  Component Value Date   WBC 8.2 07/01/2021   HGB 9.7 (L) 07/01/2021   HCT 28.6 (L) 07/01/2021   MCV 101.1 (H) 07/01/2021   PLT 188 07/01/2021   Lab Results  Component Value Date   NA 131 (L) 07/01/2021   K 3.9 07/01/2021   CO2 26 07/01/2021   GLUCOSE 104 (H) 07/01/2021   BUN 49 (H) 07/01/2021   CREATININE 6.51 (H) 07/01/2021  BILITOT 0.6 06/29/2021   ALKPHOS 65 06/29/2021   AST 39 06/29/2021   ALT 20 06/29/2021    PROT 8.2 (H) 06/29/2021   ALBUMIN 2.7 (L) 07/01/2021   CALCIUM 7.7 (L) 07/01/2021   ANIONGAP 14 07/01/2021   No results found for: CHOL No results found for: HDL No results found for: LDLCALC No results found for: TRIG No results found for: CHOLHDL No results found for: HGBA1C

## 2021-08-24 LAB — CMP14+EGFR
ALT: 17 IU/L (ref 0–44)
AST: 37 IU/L (ref 0–40)
Albumin/Globulin Ratio: 1.7 (ref 1.2–2.2)
Albumin: 4.3 g/dL (ref 3.7–4.7)
Alkaline Phosphatase: 91 IU/L (ref 44–121)
BUN/Creatinine Ratio: 5 — ABNORMAL LOW (ref 10–24)
BUN: 39 mg/dL — ABNORMAL HIGH (ref 8–27)
Bilirubin Total: <0.2 mg/dL (ref 0.0–1.2)
CO2: 25 mmol/L (ref 20–29)
Calcium: 8.8 mg/dL (ref 8.6–10.2)
Chloride: 93 mmol/L — ABNORMAL LOW (ref 96–106)
Creatinine, Ser: 7.28 mg/dL (ref 0.76–1.27)
Globulin, Total: 2.5 g/dL (ref 1.5–4.5)
Glucose: 86 mg/dL (ref 65–99)
Potassium: 4.3 mmol/L (ref 3.5–5.2)
Sodium: 140 mmol/L (ref 134–144)
Total Protein: 6.8 g/dL (ref 6.0–8.5)
eGFR: 7 mL/min/{1.73_m2} — ABNORMAL LOW (ref >59)

## 2021-08-24 LAB — LIPID PANEL
Chol/HDL Ratio: 4.9 ratio (ref 0.0–5.0)
Cholesterol, Total: 146 mg/dL (ref 100–199)
HDL: 30 mg/dL — ABNORMAL LOW (ref >39)
LDL Chol Calc (NIH): 90 mg/dL (ref 0–99)
Triglycerides: 148 mg/dL (ref 0–149)
VLDL Cholesterol Cal: 26 mg/dL (ref 5–40)

## 2021-08-24 LAB — VITAMIN D 25 HYDROXY (VIT D DEFICIENCY, FRACTURES): Vit D, 25-Hydroxy: 47.7 ng/mL (ref 30.0–100.0)

## 2021-08-24 LAB — TSH: TSH: 2.99 u[IU]/mL (ref 0.450–4.500)

## 2021-09-08 ENCOUNTER — Other Ambulatory Visit: Payer: Self-pay

## 2021-09-08 DIAGNOSIS — N186 End stage renal disease: Secondary | ICD-10-CM

## 2021-09-15 ENCOUNTER — Other Ambulatory Visit: Payer: Self-pay

## 2021-09-15 ENCOUNTER — Encounter (HOSPITAL_COMMUNITY): Payer: Self-pay | Admitting: *Deleted

## 2021-09-15 ENCOUNTER — Emergency Department (HOSPITAL_COMMUNITY): Payer: 59

## 2021-09-15 ENCOUNTER — Inpatient Hospital Stay (HOSPITAL_COMMUNITY)
Admission: EM | Admit: 2021-09-15 | Discharge: 2021-09-25 | DRG: 252 | Disposition: A | Discharge status: Home Health | Payer: 59 | Attending: Internal Medicine | Admitting: Internal Medicine

## 2021-09-15 DIAGNOSIS — D631 Anemia in chronic kidney disease: Secondary | ICD-10-CM | POA: Diagnosis present

## 2021-09-15 DIAGNOSIS — I998 Other disorder of circulatory system: Secondary | ICD-10-CM | POA: Diagnosis not present

## 2021-09-15 DIAGNOSIS — Z91018 Allergy to other foods: Secondary | ICD-10-CM

## 2021-09-15 DIAGNOSIS — N2581 Secondary hyperparathyroidism of renal origin: Secondary | ICD-10-CM | POA: Diagnosis present

## 2021-09-15 DIAGNOSIS — Z7982 Long term (current) use of aspirin: Secondary | ICD-10-CM | POA: Diagnosis not present

## 2021-09-15 DIAGNOSIS — Z9115 Patient's noncompliance with renal dialysis: Secondary | ICD-10-CM

## 2021-09-15 DIAGNOSIS — Z820 Family history of epilepsy and other diseases of the nervous system: Secondary | ICD-10-CM | POA: Diagnosis not present

## 2021-09-15 DIAGNOSIS — Z6830 Body mass index (BMI) 30.0-30.9, adult: Secondary | ICD-10-CM | POA: Diagnosis not present

## 2021-09-15 DIAGNOSIS — Z20822 Contact with and (suspected) exposure to covid-19: Secondary | ICD-10-CM | POA: Diagnosis present

## 2021-09-15 DIAGNOSIS — Z8673 Personal history of transient ischemic attack (TIA), and cerebral infarction without residual deficits: Secondary | ICD-10-CM

## 2021-09-15 DIAGNOSIS — F17210 Nicotine dependence, cigarettes, uncomplicated: Secondary | ICD-10-CM | POA: Diagnosis present

## 2021-09-15 DIAGNOSIS — L03115 Cellulitis of right lower limb: Secondary | ICD-10-CM | POA: Diagnosis present

## 2021-09-15 DIAGNOSIS — R338 Other retention of urine: Secondary | ICD-10-CM | POA: Diagnosis present

## 2021-09-15 DIAGNOSIS — Z79899 Other long term (current) drug therapy: Secondary | ICD-10-CM

## 2021-09-15 DIAGNOSIS — B182 Chronic viral hepatitis C: Secondary | ICD-10-CM | POA: Diagnosis present

## 2021-09-15 DIAGNOSIS — N401 Enlarged prostate with lower urinary tract symptoms: Secondary | ICD-10-CM | POA: Diagnosis present

## 2021-09-15 DIAGNOSIS — M7989 Other specified soft tissue disorders: Secondary | ICD-10-CM | POA: Diagnosis not present

## 2021-09-15 DIAGNOSIS — Z7902 Long term (current) use of antithrombotics/antiplatelets: Secondary | ICD-10-CM | POA: Diagnosis not present

## 2021-09-15 DIAGNOSIS — I739 Peripheral vascular disease, unspecified: Secondary | ICD-10-CM

## 2021-09-15 DIAGNOSIS — Z66 Do not resuscitate: Secondary | ICD-10-CM | POA: Diagnosis present

## 2021-09-15 DIAGNOSIS — I132 Hypertensive heart and chronic kidney disease with heart failure and with stage 5 chronic kidney disease, or end stage renal disease: Secondary | ICD-10-CM | POA: Diagnosis present

## 2021-09-15 DIAGNOSIS — E785 Hyperlipidemia, unspecified: Secondary | ICD-10-CM | POA: Diagnosis present

## 2021-09-15 DIAGNOSIS — E559 Vitamin D deficiency, unspecified: Secondary | ICD-10-CM | POA: Diagnosis present

## 2021-09-15 DIAGNOSIS — N186 End stage renal disease: Secondary | ICD-10-CM | POA: Diagnosis present

## 2021-09-15 DIAGNOSIS — I70261 Atherosclerosis of native arteries of extremities with gangrene, right leg: Principal | ICD-10-CM | POA: Diagnosis present

## 2021-09-15 DIAGNOSIS — I7092 Chronic total occlusion of artery of the extremities: Secondary | ICD-10-CM | POA: Diagnosis not present

## 2021-09-15 DIAGNOSIS — J449 Chronic obstructive pulmonary disease, unspecified: Secondary | ICD-10-CM | POA: Diagnosis present

## 2021-09-15 DIAGNOSIS — L0390 Cellulitis, unspecified: Secondary | ICD-10-CM | POA: Diagnosis not present

## 2021-09-15 DIAGNOSIS — Z7951 Long term (current) use of inhaled steroids: Secondary | ICD-10-CM

## 2021-09-15 DIAGNOSIS — I151 Hypertension secondary to other renal disorders: Secondary | ICD-10-CM | POA: Diagnosis present

## 2021-09-15 DIAGNOSIS — N2889 Other specified disorders of kidney and ureter: Secondary | ICD-10-CM | POA: Diagnosis not present

## 2021-09-15 DIAGNOSIS — I779 Disorder of arteries and arterioles, unspecified: Secondary | ICD-10-CM | POA: Diagnosis not present

## 2021-09-15 DIAGNOSIS — D62 Acute posthemorrhagic anemia: Secondary | ICD-10-CM | POA: Diagnosis not present

## 2021-09-15 DIAGNOSIS — Z992 Dependence on renal dialysis: Secondary | ICD-10-CM | POA: Diagnosis not present

## 2021-09-15 DIAGNOSIS — Z716 Tobacco abuse counseling: Secondary | ICD-10-CM

## 2021-09-15 DIAGNOSIS — E669 Obesity, unspecified: Secondary | ICD-10-CM | POA: Diagnosis present

## 2021-09-15 DIAGNOSIS — I70263 Atherosclerosis of native arteries of extremities with gangrene, bilateral legs: Secondary | ICD-10-CM | POA: Diagnosis not present

## 2021-09-15 DIAGNOSIS — I5032 Chronic diastolic (congestive) heart failure: Secondary | ICD-10-CM | POA: Diagnosis present

## 2021-09-15 HISTORY — DX: Pneumonia, unspecified organism: J18.9

## 2021-09-15 LAB — BASIC METABOLIC PANEL
Anion gap: 16 — ABNORMAL HIGH (ref 5–15)
BUN: 61 mg/dL — ABNORMAL HIGH (ref 8–23)
CO2: 24 mmol/L (ref 22–32)
Calcium: 8.5 mg/dL — ABNORMAL LOW (ref 8.9–10.3)
Chloride: 96 mmol/L — ABNORMAL LOW (ref 98–111)
Creatinine, Ser: 13.07 mg/dL — ABNORMAL HIGH (ref 0.61–1.24)
GFR, Estimated: 4 mL/min — ABNORMAL LOW (ref >60)
Glucose, Bld: 84 mg/dL (ref 70–99)
Potassium: 4.7 mmol/L (ref 3.5–5.1)
Sodium: 136 mmol/L (ref 135–145)

## 2021-09-15 LAB — CBC WITH DIFFERENTIAL/PLATELET
Abs Immature Granulocytes: 0.02 10*3/uL (ref 0.00–0.07)
Basophils Absolute: 0 10*3/uL (ref 0.0–0.1)
Basophils Relative: 1 %
Eosinophils Absolute: 0.3 10*3/uL (ref 0.0–0.5)
Eosinophils Relative: 4 %
HCT: 32.3 % — ABNORMAL LOW (ref 39.0–52.0)
Hemoglobin: 10.6 g/dL — ABNORMAL LOW (ref 13.0–17.0)
Immature Granulocytes: 0 %
Lymphocytes Relative: 11 %
Lymphs Abs: 0.9 10*3/uL (ref 0.7–4.0)
MCH: 35 pg — ABNORMAL HIGH (ref 26.0–34.0)
MCHC: 32.8 g/dL (ref 30.0–36.0)
MCV: 106.6 fL — ABNORMAL HIGH (ref 80.0–100.0)
Monocytes Absolute: 0.6 10*3/uL (ref 0.1–1.0)
Monocytes Relative: 7 %
Neutro Abs: 6 10*3/uL (ref 1.7–7.7)
Neutrophils Relative %: 77 %
Platelets: 160 10*3/uL (ref 150–400)
RBC: 3.03 MIL/uL — ABNORMAL LOW (ref 4.22–5.81)
RDW: 15.1 % (ref 11.5–15.5)
WBC: 7.8 10*3/uL (ref 4.0–10.5)
nRBC: 0 % (ref 0.0–0.2)

## 2021-09-15 LAB — RESP PANEL BY RT-PCR (FLU A&B, COVID) ARPGX2
Influenza A by PCR: NEGATIVE
Influenza B by PCR: NEGATIVE
SARS Coronavirus 2 by RT PCR: NEGATIVE

## 2021-09-15 LAB — C-REACTIVE PROTEIN: CRP: 4.6 mg/dL — ABNORMAL HIGH (ref <1.0)

## 2021-09-15 LAB — SEDIMENTATION RATE: Sed Rate: 75 mm/hr — ABNORMAL HIGH (ref 0–16)

## 2021-09-15 LAB — LACTIC ACID, PLASMA: Lactic Acid, Venous: 1.2 mmol/L (ref 0.5–1.9)

## 2021-09-15 MED ORDER — RENA-VITE PO TABS
1.0000 | ORAL_TABLET | Freq: Every day | ORAL | Status: DC
Start: 2021-09-16 — End: 2021-09-25
  Administered 2021-09-16 – 2021-09-24 (×9): 1 via ORAL
  Filled 2021-09-15 (×9): qty 1

## 2021-09-15 MED ORDER — METOPROLOL TARTRATE 25 MG PO TABS
25.0000 mg | ORAL_TABLET | Freq: Every day | ORAL | Status: DC
  Administered 2021-09-18 – 2021-09-24 (×7): 25 mg via ORAL
  Filled 2021-09-15 (×9): qty 1

## 2021-09-15 MED ORDER — UMECLIDINIUM BROMIDE 62.5 MCG/INH IN AEPB
1.0000 | INHALATION_SPRAY | Freq: Every day | RESPIRATORY_TRACT | Status: DC
  Administered 2021-09-16 – 2021-09-25 (×8): 1 via RESPIRATORY_TRACT
  Filled 2021-09-15 (×3): qty 7

## 2021-09-15 MED ORDER — SODIUM CHLORIDE 0.9 % IV SOLN
2.0000 g | INTRAVENOUS | Status: DC
  Administered 2021-09-16 – 2021-09-17 (×2): 2 g via INTRAVENOUS
  Filled 2021-09-15 (×2): qty 20

## 2021-09-15 MED ORDER — SUCROFERRIC OXYHYDROXIDE 500 MG PO CHEW
1000.0000 mg | CHEWABLE_TABLET | Freq: Three times a day (TID) | ORAL | Status: DC
  Administered 2021-09-16 – 2021-09-25 (×16): 1000 mg via ORAL
  Filled 2021-09-15 (×27): qty 2
  Filled 2021-09-15: qty 12
  Filled 2021-09-15 (×7): qty 2

## 2021-09-15 MED ORDER — ONDANSETRON HCL 4 MG/2ML IJ SOLN
4.0000 mg | Freq: Four times a day (QID) | INTRAMUSCULAR | Status: DC | PRN
  Administered 2021-09-17 – 2021-09-20 (×4): 4 mg via INTRAVENOUS
  Filled 2021-09-15 (×4): qty 2

## 2021-09-15 MED ORDER — ASPIRIN EC 325 MG PO TBEC
325.0000 mg | DELAYED_RELEASE_TABLET | Freq: Every day | ORAL | Status: DC
  Administered 2021-09-15 – 2021-09-24 (×10): 325 mg via ORAL
  Filled 2021-09-15 (×10): qty 1

## 2021-09-15 MED ORDER — ONDANSETRON HCL 4 MG PO TABS: 4.0000 mg | ORAL_TABLET | Freq: Four times a day (QID) | ORAL | Status: DC | PRN

## 2021-09-15 MED ORDER — OXYCODONE HCL 5 MG PO TABS
5.0000 mg | ORAL_TABLET | ORAL | Status: DC | PRN
  Administered 2021-09-15 – 2021-09-19 (×8): 5 mg via ORAL
  Filled 2021-09-15 (×9): qty 1

## 2021-09-15 MED ORDER — CALCIUM CARBONATE ANTACID 500 MG PO CHEW
500.0000 mg | CHEWABLE_TABLET | Freq: Every day | ORAL | Status: DC
  Administered 2021-09-15 – 2021-09-23 (×9): 500 mg via ORAL
  Filled 2021-09-15 (×9): qty 3

## 2021-09-15 MED ORDER — ENOXAPARIN SODIUM 30 MG/0.3ML IJ SOSY
30.0000 mg | PREFILLED_SYRINGE | INTRAMUSCULAR | Status: DC
  Administered 2021-09-15 – 2021-09-19 (×5): 30 mg via SUBCUTANEOUS
  Filled 2021-09-15 (×5): qty 0.3

## 2021-09-15 MED ORDER — ONDANSETRON HCL 4 MG/2ML IJ SOLN
4.0000 mg | Freq: Once | INTRAMUSCULAR | Status: AC
  Administered 2021-09-15: 4 mg via INTRAVENOUS
  Filled 2021-09-15: qty 2

## 2021-09-15 MED ORDER — TAMSULOSIN HCL 0.4 MG PO CAPS
0.4000 mg | ORAL_CAPSULE | Freq: Every day | ORAL | Status: DC
  Administered 2021-09-15 – 2021-09-24 (×10): 0.4 mg via ORAL
  Filled 2021-09-15 (×10): qty 1

## 2021-09-15 MED ORDER — HYDROMORPHONE HCL 1 MG/ML IJ SOLN
0.5000 mg | INTRAMUSCULAR | Status: DC | PRN
  Administered 2021-09-15: 1 mg via INTRAVENOUS
  Administered 2021-09-16: 0.5 mg via INTRAVENOUS
  Administered 2021-09-16 – 2021-09-18 (×10): 1 mg via INTRAVENOUS
  Administered 2021-09-19 (×2): 0.5 mg via INTRAVENOUS
  Administered 2021-09-19 – 2021-09-24 (×16): 1 mg via INTRAVENOUS
  Filled 2021-09-15 (×7): qty 1
  Filled 2021-09-15: qty 0.5
  Filled 2021-09-15 (×15): qty 1
  Filled 2021-09-15: qty 0.5
  Filled 2021-09-15 (×6): qty 1
  Filled 2021-09-15: qty 0.5

## 2021-09-15 MED ORDER — METRONIDAZOLE 500 MG/100ML IV SOLN
500.0000 mg | Freq: Three times a day (TID) | INTRAVENOUS | Status: DC
Start: 2021-09-15 — End: 2021-09-18
  Administered 2021-09-15 – 2021-09-18 (×8): 500 mg via INTRAVENOUS
  Filled 2021-09-15 (×8): qty 100

## 2021-09-15 MED ORDER — VANCOMYCIN HCL IN DEXTROSE 1-5 GM/200ML-% IV SOLN
1000.0000 mg | Freq: Once | INTRAVENOUS | Status: AC
  Administered 2021-09-15: 1000 mg via INTRAVENOUS
  Filled 2021-09-15: qty 200

## 2021-09-15 MED ORDER — MORPHINE SULFATE (PF) 4 MG/ML IV SOLN
4.0000 mg | Freq: Once | INTRAVENOUS | Status: AC
  Administered 2021-09-15: 4 mg via INTRAVENOUS
  Filled 2021-09-15: qty 1

## 2021-09-15 MED ORDER — SODIUM CHLORIDE 0.9 % IV SOLN
2.0000 g | Freq: Once | INTRAVENOUS | Status: AC
  Administered 2021-09-15: 2 g via INTRAVENOUS
  Filled 2021-09-15: qty 2

## 2021-09-15 MED ORDER — FLUTICASONE FUROATE-VILANTEROL 200-25 MCG/INH IN AEPB
1.0000 | INHALATION_SPRAY | Freq: Every day | RESPIRATORY_TRACT | Status: DC
  Administered 2021-09-16 – 2021-09-25 (×8): 1 via RESPIRATORY_TRACT
  Filled 2021-09-15 (×3): qty 28

## 2021-09-15 NOTE — ED Triage Notes (Signed)
Pt had his toenail removed from his right second toe x one week ago; pt today presents with increased pain redness and swelling to foot;

## 2021-09-15 NOTE — ED Provider Notes (Signed)
[facility] Provider Note   CSN: [id number] Arrival date & time: 09/15/21  1310     History Chief Complaint  Patient presents with   Wound Check    Kerry Underwood is a 70 y.o. male.  Pt presents to the ED today with redness and swelling to his right foot.  Pt was initially seen at urgent care on 9/26 and had an ingrown toenail (right 2nd toe) that was removed.  He was given a dose of rocephin and d/c on keflex.  Pt said he took a few days of the keflex, but stopped due to severe diarrhea.  He is a dialysis and missed a dialysis session due to the diarrhea.  He went back to that urgent care on 10/5 and was put on clindamycin.  Pt said the pain and redness is worse.  He missed dialysis again yesterday due to the pain.  Pt denies any f/c.      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   Unspecified protein-calorie malnutrition ([place]) 04/25/2020   Chronic diastolic heart failure ([place]) 04/20/2020   Secondary hyperparathyroidism of renal origin ([place]) 04/20/2020   Anemia due to chronic kidney disease 04/15/2020   Benign prostatic hyperplasia with urinary retention    Chronic obstructive pulmonary disease ([place])    Hypertension secondary to other renal disorders     Past Surgical History:  Procedure Laterality Date   BASCILIC VEIN TRANSPOSITION Right 04/19/2020   Procedure: Basilic Vein Transposition Right Arm;  Surgeon: Waynetta Sandy, MD;  Location: [place];  Service: Vascular;  Laterality: Right;   INSERTION OF DIALYSIS CATHETER  Right 04/19/2020   Procedure: Insertion Of Right Internal Jugular Dialysis Catheter;  Surgeon: Waynetta Sandy, MD;  Location: [facility] OR;  Service: Vascular;  Laterality: Right;   MULTIPLE TOOTH EXTRACTIONS         Family History  Problem Relation Age of Onset   Alzheimer's disease Mother     Social History   Tobacco Use   Smoking status: Every Day    Packs/day: 0.50    Types: Cigarettes   Smokeless tobacco: Former    Types: Nurse, children's Use: Never used  Substance Use Topics   Alcohol use: No   Drug use: Not Currently    Types: Marijuana    Home Medications Prior to Admission medications   Medication Sig Start Date End Date Taking? Authorizing Provider  albuterol (VENTOLIN HFA) 108 (90 Base) MCG/ACT inhaler Inhale 2 puffs into the lungs every 6 (six) hours as needed for wheezing or shortness of breath. 07/02/21 09/15/21 Yes Barton Dubois, MD  aspirin EC 325 MG tablet Take 325 mg by mouth at bedtime.   Yes [provider]  Calcium Carbonate 500 MG CHEW Chew 500 mg by mouth at bedtime.   Yes [provider]  Cholecalciferol (VITAMIN D3 PO) Take by mouth daily.   Yes [provider]  clindamycin (CLEOCIN) 300 MG capsule  Take 300 mg by mouth 4 (four) times daily. 09/13/21  Yes [provider]  fluticasone furoate-vilanterol (BREO ELLIPTA) 200-25 MCG/INH AEPB Inhale 1 puff into the lungs daily. 07/02/21  Yes Barton Dubois, MD  HYDROcodone-acetaminophen (NORCO/VICODIN) 5-325 MG tablet Take 1 tablet by mouth every 4 (four) hours as needed. 09/13/21  Yes [provider]  metoprolol tartrate (LOPRESSOR) 25 MG tablet Take 1 tablet (25 mg total) by mouth 2 (two) times daily. Patient taking differently: Take 25 mg by mouth daily at 2 PM. 07/12/21 09/15/21 Yes [name] F, FNP  sucroferric oxyhydroxide (VELPHORO) 500 MG chewable tablet Chew 1,000 mg by mouth 3 (three) times daily with meals.   Yes [provider]   tamsulosin (FLOMAX) 0.4 MG CAPS capsule Take 0.4 mg by mouth at bedtime. 05/24/20  Yes [provider]  tiotropium (SPIRIVA HANDIHALER) 18 MCG inhalation capsule Place 1 capsule (18 mcg total) into inhaler and inhale daily. 07/02/21 09/15/21 Yes Barton Dubois, MD  B Complex-C-Folic Acid (DIALYVITE Q000111Q PO) Take 0.8 mg by mouth at bedtime. Patient not taking: No sig reported    [provider]  amLODipine (NORVASC) 5 MG tablet Take 1 tablet (5 mg total) by mouth daily. Patient not taking: No sig reported 04/21/20 06/29/21  Alma Friendly, MD    Allergies    Strawberry (diagnostic) and Strawberry extract  Review of Systems   Review of Systems  Musculoskeletal:        Right foot pain and redness  All other systems reviewed and are negative.  Physical Exam Updated Vital Signs BP (!) 137/95   Pulse 82   Temp 98.4 F (36.9 C)   Resp 16   Ht '5\' 5"'$  (1.651 m)   Wt 79.4 kg   SpO2 99%   BMI 29.12 kg/m   Physical Exam Vitals and nursing note reviewed.  Constitutional:      Appearance: Normal appearance.  HENT:     Head: Normocephalic and atraumatic.     Right Ear: External ear normal.     Left Ear: External ear normal.     Nose: Nose normal.     Mouth/Throat:     Mouth: Mucous membranes are moist.     Pharynx: Oropharynx is clear.  Eyes:     Extraocular Movements: Extraocular movements intact.     Conjunctiva/sclera: Conjunctivae normal.     Pupils: Pupils are equal, round, and reactive to light.  Cardiovascular:     Rate and Rhythm: Normal rate and regular rhythm.     Pulses: Normal pulses.     Heart sounds: Normal heart sounds.  Pulmonary:     Effort: Pulmonary effort is normal.     Breath sounds: Normal breath sounds.  Chest:     Comments: HD catheter right upper chest Abdominal:     General: Abdomen is flat. Bowel sounds are normal.     Palpations: Abdomen is soft.  Musculoskeletal:     Cervical back: Normal range of motion and neck supple.      Right lower leg: Edema present.     Left lower leg: Edema present.     Comments: Right foot pain and redness  Skin:    Capillary Refill: Capillary refill takes less than 2 seconds.     Comments: Cellulitis to right foot  Neurological:     General: No focal deficit present.     Mental Status: He is alert and oriented to person, place, and time.  Psychiatric:  Mood and Affect: Mood normal.        Behavior: Behavior normal.       ED Results / Procedures / Treatments   Labs (all labs ordered are listed, but only abnormal results are displayed) Labs Reviewed  BASIC METABOLIC PANEL - Abnormal; Notable for the following components:      Result Value   Chloride 96 (*)    BUN 61 (*)    Creatinine, Ser 13.07 (*)    Calcium 8.5 (*)    GFR, Estimated 4 (*)    Anion gap 16 (*)    All other components within normal limits  CBC WITH DIFFERENTIAL/PLATELET - Abnormal; Notable for the following components:   RBC 3.03 (*)    Hemoglobin 10.6 (*)    HCT 32.3 (*)    MCV 106.6 (*)    MCH 35.0 (*)    All other components within normal limits  SEDIMENTATION RATE - Abnormal; Notable for the following components:   Sed Rate 75 (*)    All other components within normal limits  C-REACTIVE PROTEIN - Abnormal; Notable for the following components:   CRP 4.6 (*)    All other components within normal limits  CULTURE, BLOOD (ROUTINE X 2)  CULTURE, BLOOD (ROUTINE X 2)  RESP PANEL BY RT-PCR (FLU A&B, COVID) ARPGX2  LACTIC ACID, PLASMA  LACTIC ACID, PLASMA    EKG None  Radiology DG Foot Complete Right  Result Date: 09/15/2021 CLINICAL DATA:  Swelling EXAM: RIGHT FOOT COMPLETE - 3+ VIEW COMPARISON:  None. FINDINGS: There is no acute osseous abnormality. There is moderate to severe first MTP joint degenerative arthritis. There is no frank bony destruction or osseous erosion. There is diffuse soft tissue swelling of the foot. Extensive vascular calcifications. Plantar dorsal calcaneal spurring.  IMPRESSION: Extensive soft tissue swelling of the foot. No acute osseous abnormality. Moderate-severe first MTP osteoarthritis. Electronically Signed   By: Maurine Simmering M.D.   On: 09/15/2021 15:08    Procedures Procedures   Medications Ordered in ED Medications  vancomycin (VANCOCIN) IVPB 1000 mg/200 mL premix (1,000 mg Intravenous New Bag/Given 09/15/21 1817)  ceFEPIme (MAXIPIME) 2 g in sodium chloride 0.9 % 100 mL IVPB (0 g Intravenous Stopped 09/15/21 1817)  morphine 4 MG/ML injection 4 mg (4 mg Intravenous Given 09/15/21 1804)  ondansetron (ZOFRAN) injection 4 mg (4 mg Intravenous Given 09/15/21 1804)    ED Course  I have reviewed the triage vital signs and the nursing notes.  Pertinent labs & imaging results that were available during my care of the patient were reviewed by me and considered in my medical decision making (see chart for details).    MDM Rules/Calculators/A&P                           Pt has been on 2 different abx and sx are worsening.  Pt given a dose of vancomycin and maxipime in ED.  He is d/w Dr. Nehemiah Settle (triad) for admission.   Final Clinical Impression(s) / ED Diagnoses Final diagnoses:  Cellulitis of right lower extremity  ESRD on hemodialysis [facility])    Rx / DC Orders ED Discharge Orders     None        Isla Pence, MD 09/15/21 1839

## 2021-09-15 NOTE — ED Notes (Signed)
Attempted report x1. 

## 2021-09-15 NOTE — H&P (Signed)
History and Physical  Kerry Underwood LAG:[id number] DOB: September 15, 1951 DOA: 09/15/2021  Referring physician: Gilford Raid, MD, ED physician PCP: [name], FNP  Outpatient Specialists:   Patient Coming From: home  Chief Complaint: right foot pain and swelling  HPI: Kerry Underwood is a 70 y.o. male with a history of end-stage renal disease on dialysis, CHF, COPD, hypertension, history of strokes.  Patient seen for right foot pain and swelling.  The patient has been having pain in his right foot for several weeks.  The main problem has been his second digit with a toenail that was growing vertically and causing impingement of the nailbed with the corners of the nail.  The patient went to urgent care on 9/26.  They removed the nail, gave him a dose of Rocephin and placed him on Keflex.  The patient stopped the Keflex after a few days due to developing diarrhea.  He returned to the urgent care on 10/5 (2 days ago) due to worsening symptoms and was placed on clindamycin.  He was told at that time if he had increased swelling and pain then he should come to the hospital.  The patient reports having dramatic increase in swelling since that time and came for evaluation.  Patient is unable to ambulate due to the pain.  His pain goes from his toes to just below his right knee.  No radiation of the pain.  Denies fevers, chills, nausea, vomiting.  Denies chest pain and shortness of breath.  Emergency Department Course: Patient has a normal white blood count.  CRP was 4.6.  ESR 75.  Lactic acid normal.  Patient started on Vanco and cefepime.  Review of Systems:  Pt denies any fevers, chills, nausea, vomiting, diarrhea, constipation, abdominal pain, shortness of breath, dyspnea on exertion, orthopnea, cough, wheezing, palpitations, headache, vision changes, lightheadedness, dizziness, melena, rectal bleeding.  Review of systems are otherwise negative  Past Medical History:  Diagnosis Date   Anemia    CHF  (congestive heart failure) (HCC)    COPD (chronic obstructive pulmonary disease) ([place])    Full dentures    Hypertension    Pyelonephritis 09/11/2017   Renal disorder    Stroke [facility])    " A few mini strokes, I didn't know it "   Past Surgical History:  Procedure Laterality Date   BASCILIC VEIN TRANSPOSITION Right 04/19/2020   Procedure: Basilic Vein Transposition Right Arm;  Surgeon: Waynetta Sandy, MD;  Location: [place];  Service: Vascular;  Laterality: Right;   INSERTION OF DIALYSIS CATHETER Right 04/19/2020   Procedure: Insertion Of Right Internal Jugular Dialysis Catheter;  Surgeon: Waynetta Sandy, MD;  Location: [place];  Service: Vascular;  Laterality: Right;   MULTIPLE TOOTH EXTRACTIONS     Social History:  reports that he has been smoking cigarettes. He has been smoking an average of .5 packs per day. He has quit using smokeless tobacco.  His smokeless tobacco use included chew. He reports that he does not currently use drugs after having used the following drugs: Marijuana. He reports that he does not drink alcohol. Patient lives at home  Allergies  Allergen Reactions   Strawberry (Diagnostic)    Strawberry Extract     Other reaction(s): Unknown    Family History  Problem Relation Age of Onset   Alzheimer's disease Mother       Prior to Admission medications   Medication Sig Start Date End Date Taking? Authorizing Provider  albuterol (VENTOLIN HFA) 108 (90 Base)  MCG/ACT inhaler Inhale 2 puffs into the lungs every 6 (six) hours as needed for wheezing or shortness of breath. 07/02/21 09/15/21 Yes Barton Dubois, MD  aspirin EC 325 MG tablet Take 325 mg by mouth at bedtime.   Yes [provider]  Calcium Carbonate 500 MG CHEW Chew 500 mg by mouth at bedtime.   Yes [provider]  Cholecalciferol (VITAMIN D3 PO) Take by mouth daily.   Yes [provider]  clindamycin (CLEOCIN) 300 MG capsule Take 300 mg by mouth 4 (four) times daily.  09/13/21  Yes [provider]  fluticasone furoate-vilanterol (BREO ELLIPTA) 200-25 MCG/INH AEPB Inhale 1 puff into the lungs daily. 07/02/21  Yes Barton Dubois, MD  HYDROcodone-acetaminophen (NORCO/VICODIN) 5-325 MG tablet Take 1 tablet by mouth every 4 (four) hours as needed. 09/13/21  Yes [provider]  metoprolol tartrate (LOPRESSOR) 25 MG tablet Take 1 tablet (25 mg total) by mouth 2 (two) times daily. Patient taking differently: Take 25 mg by mouth daily at 2 PM. 07/12/21 09/15/21 Yes [name] F, FNP  sucroferric oxyhydroxide (VELPHORO) 500 MG chewable tablet Chew 1,000 mg by mouth 3 (three) times daily with meals.   Yes [provider]  tamsulosin (FLOMAX) 0.4 MG CAPS capsule Take 0.4 mg by mouth at bedtime. 05/24/20  Yes [provider]  tiotropium (SPIRIVA HANDIHALER) 18 MCG inhalation capsule Place 1 capsule (18 mcg total) into inhaler and inhale daily. 07/02/21 09/15/21 Yes Barton Dubois, MD  B Complex-C-Folic Acid (DIALYVITE 716 PO) Take 0.8 mg by mouth at bedtime. Patient not taking: No sig reported    [provider]  amLODipine (NORVASC) 5 MG tablet Take 1 tablet (5 mg total) by mouth daily. Patient not taking: No sig reported 04/21/20 06/29/21  Alma Friendly, MD    Physical Exam: BP (!) 142/75 (BP Location: Left Arm)   Pulse 76   Temp 98.4 F (36.9 C)   Resp 14   Ht 5' 5"  (1.651 m)   Wt 79.4 kg   SpO2 99%   BMI 29.12 kg/m   General: Elderly male. Awake and alert and oriented x3. No acute cardiopulmonary distress.  HEENT: Normocephalic atraumatic.  Right and left ears normal in appearance.  Pupils equal, round, reactive to light. Extraocular muscles are intact. Sclerae anicteric and noninjected.  Moist mucosal membranes. No mucosal lesions.  Neck: Neck supple without lymphadenopathy. No carotid bruits. No masses palpated.  Cardiovascular: Regular rate with normal S1-S2 sounds. No murmurs, rubs, gallops auscultated. No JVD.   Respiratory: Good respiratory effort with no wheezes, rales, rhonchi. Lungs clear to auscultation bilaterally.  No accessory muscle use. Abdomen: Soft, nontender, nondistended. Active bowel sounds. No masses or hepatosplenomegaly  Skin: Second digit of right foot is status post onychectomy.  There is extreme tenderness to the second digit, and the majority of the foot.  There is erythema and swelling to that area as well to his upper tibia.  Remainder of the skin is dry, warm to touch. 2+ dorsalis pedis and radial pulses. Musculoskeletal: No calf or leg pain. All major joints not erythematous nontender.  No upper or lower joint deformation.  Good ROM.  No contractures  Psychiatric: Intact judgment and insight. Pleasant and cooperative. Neurologic: No focal neurological deficits. Strength is 5/5 and symmetric in upper and lower extremities.  Cranial nerves II through XII are grossly intact.           Labs on Admission: I have personally reviewed following labs and imaging studies  CBC: Recent Labs  Lab 09/15/21 1504  WBC 7.8  NEUTROABS 6.0  HGB 10.6*  HCT 32.3*  MCV 106.6*  PLT 151   Basic Metabolic Panel: Recent Labs  Lab 09/15/21 1504  NA 136  K 4.7  CL 96*  CO2 24  GLUCOSE 84  BUN 61*  CREATININE 13.07*  CALCIUM 8.5*   GFR: Estimated Creatinine Clearance: 5 mL/min (A) (by C-G formula based on SCr of 13.07 mg/dL (H)). Liver Function Tests: No results for input(s): AST, ALT, ALKPHOS, BILITOT, PROT, ALBUMIN in the last 168 hours. No results for input(s): LIPASE, AMYLASE in the last 168 hours. No results for input(s): AMMONIA in the last 168 hours. Coagulation Profile: No results for input(s): INR, PROTIME in the last 168 hours. Cardiac Enzymes: No results for input(s): CKTOTAL, CKMB, CKMBINDEX, TROPONINI in the last 168 hours. BNP (last 3 results) No results for input(s): PROBNP in the last 8760 hours. HbA1C: No results for input(s): HGBA1C in the last 72  hours. CBG: No results for input(s): GLUCAP in the last 168 hours. Lipid Profile: No results for input(s): CHOL, HDL, LDLCALC, TRIG, CHOLHDL, LDLDIRECT in the last 72 hours. Thyroid Function Tests: No results for input(s): TSH, T4TOTAL, FREET4, T3FREE, THYROIDAB in the last 72 hours. Anemia Panel: No results for input(s): VITAMINB12, FOLATE, FERRITIN, TIBC, IRON, RETICCTPCT in the last 72 hours. Urine analysis:    Component Value Date/Time   COLORURINE YELLOW 04/15/2020 2200   APPEARANCEUR TURBID (A) 04/15/2020 2200   LABSPEC 1.010 04/15/2020 2200   PHURINE 6.0 04/15/2020 2200   GLUCOSEU NEGATIVE 04/15/2020 2200   HGBUR SMALL (A) 04/15/2020 2200   BILIRUBINUR NEGATIVE 04/15/2020 2200   KETONESUR NEGATIVE 04/15/2020 2200   PROTEINUR 100 (A) 04/15/2020 2200   NITRITE POSITIVE (A) 04/15/2020 2200   LEUKOCYTESUR LARGE (A) 04/15/2020 2200   Sepsis Labs: @LABRCNTIP (procalcitonin:4,lacticidven:4) ) Recent Results (from the past 240 hour(s))  Culture, blood (routine x 2)     Status: None (Preliminary result)   Collection Time: 09/15/21  2:56 PM   Specimen: Left Antecubital; Blood  Result Value Ref Range Status   Specimen Description LEFT ANTECUBITAL  Final   Special Requests   Final    Blood Culture adequate volume BOTTLES DRAWN AEROBIC AND ANAEROBIC Performed at [facility], [address] [place], [place] [address]    Culture PENDING  Incomplete   Report Status PENDING  Incomplete  Culture, blood (routine x 2)     Status: None (Preliminary result)   Collection Time: 09/15/21  3:04 PM   Specimen: BLOOD LEFT HAND  Result Value Ref Range Status   Specimen Description BLOOD LEFT HAND  Final   Special Requests   Final    Blood Culture results may not be optimal due to an excessive volume of blood received in culture bottles BOTTLES DRAWN AEROBIC AND ANAEROBIC Performed at [facility], [address], [place], [place] [address]    Culture PENDING  Incomplete   Report Status  PENDING  Incomplete  Resp Panel by RT-PCR (Flu A&B, Covid) Nasopharyngeal Swab     Status: None   Collection Time: 09/15/21  5:54 PM   Specimen: Nasopharyngeal Swab; Nasopharyngeal(NP) swabs in vial transport medium  Result Value Ref Range Status   SARS Coronavirus 2 by RT PCR NEGATIVE NEGATIVE Final    Comment: (NOTE) SARS-CoV-2 target nucleic acids are NOT DETECTED.  The SARS-CoV-2 RNA is generally detectable in upper respiratory specimens during the acute phase of infection. The lowest concentration of SARS-CoV-2  viral copies this assay can detect is 138 copies/mL. A negative result does not preclude SARS-Cov-2 infection and should not be used as the sole basis for treatment or other patient management decisions. A negative result may occur with  improper specimen collection/handling, submission of specimen other than nasopharyngeal swab, presence of viral mutation(s) within the areas targeted by this assay, and inadequate number of viral copies(<138 copies/mL). A negative result must be combined with clinical observations, patient history, and epidemiological information. The expected result is Negative.  Fact Sheet for Patients:  EntrepreneurPulse.com.au  Fact Sheet for Healthcare Providers:  IncredibleEmployment.be  This test is no t yet approved or cleared by the [place] FDA and  has been authorized for detection and/or diagnosis of SARS-CoV-2 by FDA under an Emergency Use Authorization (EUA). This EUA will remain  in effect (meaning this test can be used) for the duration of the COVID-19 declaration under Section 564(b)(1) of the Act, 21 U.S.C.section 360bbb-3(b)(1), unless the authorization is terminated  or revoked sooner.       Influenza A by PCR NEGATIVE NEGATIVE Final   Influenza B by PCR NEGATIVE NEGATIVE Final    Comment: (NOTE) The Xpert Xpress SARS-CoV-2/FLU/RSV plus assay is intended as an aid in the diagnosis of  influenza from Nasopharyngeal swab specimens and should not be used as a sole basis for treatment. Nasal washings and aspirates are unacceptable for Xpert Xpress SARS-CoV-2/FLU/RSV testing.  Fact Sheet for Patients: EntrepreneurPulse.com.au  Fact Sheet for Healthcare Providers: IncredibleEmployment.be  This test is not yet approved or cleared by the [place] FDA and has been authorized for detection and/or diagnosis of SARS-CoV-2 by FDA under an Emergency Use Authorization (EUA). This EUA will remain in effect (meaning this test can be used) for the duration of the COVID-19 declaration under Section 564(b)(1) of the Act, 21 U.S.C. section 360bbb-3(b)(1), unless the authorization is terminated or revoked.  Performed at [facility], [address], [place], [place] [address]      Radiological Exams on Admission: DG Foot Complete Right  Result Date: 09/15/2021 CLINICAL DATA:  Swelling EXAM: RIGHT FOOT COMPLETE - 3+ VIEW COMPARISON:  None. FINDINGS: There is no acute osseous abnormality. There is moderate to severe first MTP joint degenerative arthritis. There is no frank bony destruction or osseous erosion. There is diffuse soft tissue swelling of the foot. Extensive vascular calcifications. Plantar dorsal calcaneal spurring. IMPRESSION: Extensive soft tissue swelling of the foot. No acute osseous abnormality. Moderate-severe first MTP osteoarthritis. Electronically Signed   By: Maurine Simmering M.D.   On: 09/15/2021 15:08     Assessment/Plan: Principal Problem:   Cellulitis of right foot Active Problems:   Chronic obstructive pulmonary disease ([place])   Hypertension secondary to other renal disorders   ESRD (end stage renal disease) ([place])   Chronic diastolic heart failure ([place])    This patient was discussed with the ED physician, including pertinent vitals, physical exam findings, labs, and imaging.  We also discussed care given by the ED  provider.  Cellulitis of right foot Change antibiotics to Rocephin and Flagyl Check ABI Check hemoglobin A1c, ESR Pain control X-ray shows no bony destruction that would suggest osteomyelitis If not improving may need MRI End-stage renal disease on dialysis Consult nephrology No emergent dialysis needed tonight Chronic diastolic heart failure with hypertension Euvolemic COPD Continue home inhalers  DVT prophylaxis: Lovenox Consultants: Nephrology Code Status: DNR confirmed by patient Family Communication: None Disposition Plan: Pending   Truett Mainland, DO

## 2021-09-15 NOTE — ED Provider Notes (Signed)
Emergency Medicine Provider Triage Evaluation Note  Kerry Underwood , a 70 y.o. male  was evaluated in triage.  Pt complains of right foot swelling after having a toenail removed about 9 days ago..  Review of Systems  Positive: Swelling and pain Negative: No fever  Physical Exam  BP (!) 153/77 (BP Location: Right Arm)   Pulse 86   Temp 98.3 F (36.8 C) (Oral)   Resp 17   SpO2 100%  Gen:   Awake, no distress   Resp:  Normal effort  MSK:   Swollen right foot with erythema that extends up to mid lower leg. Other:    Medical Decision Making  Medically screening exam initiated at 2:29 PM.  Appropriate orders placed.  Kerry Underwood was informed that the remainder of the evaluation will be completed by another provider, this initial triage assessment does not replace that evaluation, and the importance of remaining in the ED until their evaluation is complete.     Hayden Rasmussen, MD 09/15/21 1754

## 2021-09-15 NOTE — ED Notes (Signed)
ED Provider at bedside. 

## 2021-09-16 ENCOUNTER — Inpatient Hospital Stay (HOSPITAL_COMMUNITY): Payer: 59

## 2021-09-16 DIAGNOSIS — L03115 Cellulitis of right lower limb: Secondary | ICD-10-CM | POA: Diagnosis not present

## 2021-09-16 LAB — HEMOGLOBIN A1C
Hgb A1c MFr Bld: 5.2 % (ref 4.8–5.6)
Mean Plasma Glucose: 102.54 mg/dL

## 2021-09-16 LAB — CBC
HCT: 27.2 % — ABNORMAL LOW (ref 39.0–52.0)
Hemoglobin: 9 g/dL — ABNORMAL LOW (ref 13.0–17.0)
MCH: 34.5 pg — ABNORMAL HIGH (ref 26.0–34.0)
MCHC: 33.1 g/dL (ref 30.0–36.0)
MCV: 104.2 fL — ABNORMAL HIGH (ref 80.0–100.0)
Platelets: 128 10*3/uL — ABNORMAL LOW (ref 150–400)
RBC: 2.61 MIL/uL — ABNORMAL LOW (ref 4.22–5.81)
RDW: 15 % (ref 11.5–15.5)
WBC: 7 10*3/uL (ref 4.0–10.5)
nRBC: 0 % (ref 0.0–0.2)

## 2021-09-16 LAB — BASIC METABOLIC PANEL
Anion gap: 14 (ref 5–15)
BUN: 66 mg/dL — ABNORMAL HIGH (ref 8–23)
CO2: 22 mmol/L (ref 22–32)
Calcium: 8.2 mg/dL — ABNORMAL LOW (ref 8.9–10.3)
Chloride: 100 mmol/L (ref 98–111)
Creatinine, Ser: 13.84 mg/dL — ABNORMAL HIGH (ref 0.61–1.24)
GFR, Estimated: 3 mL/min — ABNORMAL LOW (ref >60)
Glucose, Bld: 91 mg/dL (ref 70–99)
Potassium: 4.6 mmol/L (ref 3.5–5.1)
Sodium: 136 mmol/L (ref 135–145)

## 2021-09-16 LAB — PREALBUMIN: Prealbumin: 21.7 mg/dL (ref 18–38)

## 2021-09-16 LAB — HEPATITIS B SURFACE ANTIGEN: Hepatitis B Surface Ag: NONREACTIVE

## 2021-09-16 MED ORDER — CHLORHEXIDINE GLUCONATE CLOTH 2 % EX PADS
6.0000 | MEDICATED_PAD | Freq: Every day | CUTANEOUS | Status: DC
  Administered 2021-09-17 – 2021-09-19 (×3): 6 via TOPICAL

## 2021-09-16 MED ORDER — CHLORHEXIDINE GLUCONATE CLOTH 2 % EX PADS
6.0000 | MEDICATED_PAD | Freq: Every day | CUTANEOUS | Status: DC
  Administered 2021-09-17 – 2021-09-18 (×2): 6 via TOPICAL

## 2021-09-16 MED ORDER — HEPARIN SODIUM (PORCINE) 1000 UNIT/ML DIALYSIS
1000.0000 [IU] | INTRAMUSCULAR | Status: DC | PRN
  Administered 2021-09-16: 3200 [IU] via INTRAVENOUS_CENTRAL
  Administered 2021-09-23: 1000 [IU] via INTRAVENOUS_CENTRAL
  Filled 2021-09-16 (×4): qty 1

## 2021-09-16 MED ORDER — CALCITRIOL 0.25 MCG PO CAPS: 0.2500 ug | ORAL_CAPSULE | ORAL | Status: DC | PRN

## 2021-09-16 MED ORDER — ALTEPLASE 2 MG IJ SOLR: 2.0000 mg | Freq: Once | INTRAMUSCULAR | Status: DC | PRN

## 2021-09-16 MED ORDER — SODIUM CHLORIDE 0.9 % IV SOLN: 100.0000 mL | INTRAVENOUS | Status: DC | PRN

## 2021-09-16 MED ORDER — NEPRO/CARBSTEADY PO LIQD
237.0000 mL | Freq: Two times a day (BID) | ORAL | Status: DC
  Administered 2021-09-17: 237 mL via ORAL

## 2021-09-16 NOTE — Progress Notes (Signed)
Initial Nutrition Assessment  DOCUMENTATION CODES:   Not applicable  INTERVENTION:   Nepro Shake po BID, each supplement provides 425 kcal and 19 grams protein  Continue Rena-Vite  Check vitamin C level; supplement if deficient   NUTRITION DIAGNOSIS:   Increased nutrient needs related to chronic illness, wound healing as evidenced by estimated needs.  GOAL:   Patient will meet greater than or equal to 90% of their needs  MONITOR:   PO intake, Supplement acceptance, Labs, Weight trends  REASON FOR ASSESSMENT:   Consult Wound healing  ASSESSMENT:   70 yo male admitted with cellulitis of right foot. PMH includes ESRD/HD, CHF, COPD, HTN, strokes  Last iHD 10/4  EDW 83.5 kg Current wt 79.4 kg-below outpatient EDW  Recorded po intake 50% of meals on Renal diet  Pt currently has order for Renal MVI; pt with dialyvite on home med list but patient not taking  Labs: potassium wdl, no phosphorus available Meds: calcitriol, Tums at bedtime, Rena-vite, Velphoro with meals   NUTRITION - FOCUSED PHYSICAL EXAM:  Unable to perform  Diet Order:   Diet Order             Diet renal with fluid restriction Fluid restriction: 1500 mL Fluid; Room service appropriate? Yes; Fluid consistency: Thin  Diet effective now                   EDUCATION NEEDS:   Not appropriate for education at this time  Skin:  Skin Assessment: Skin Integrity Issues: Skin Integrity Issues:: Other (Comment) Other: cellulitis of right foot  Last BM:  10/5  Height:   Ht Readings from Last 1 Encounters:  09/15/21 '5\' 5"'$  (1.651 m)    Weight:   Wt Readings from Last 1 Encounters:  09/15/21 79.4 kg     BMI:  Body mass index is 29.12 kg/m.  Estimated Nutritional Needs:   Kcal:  2000-2200 kcals  Protein:  100-120 g  Fluid:  1000 mL plus UOP   Kerman Passey MS, RDN, LDN, CNSC Registered Dietitian III Clinical Nutrition RD Pager and On-Call Pager Number Located in [place]

## 2021-09-16 NOTE — Progress Notes (Signed)
PROGRESS NOTE    Kerry Underwood  [id number] DOB: 10-14-1951 DOA: 09/15/2021 PCP: [name], FNP    Brief Narrative:  70 y.o. male with a history of end-stage renal disease on dialysis, CHF, COPD, hypertension, history of strokes.  Patient seen for right foot pain and swelling.  The patient has been having pain in his right foot for several weeks.  The main problem has been his second digit with a toenail that was growing vertically and causing impingement of the nailbed with the corners of the nail.  The patient went to urgent care on 9/26.  They removed the nail, gave him a dose of Rocephin and placed him on Keflex.  The patient stopped the Keflex after a few days due to developing diarrhea.  He returned to the urgent care on 10/5 (2 days ago) due to worsening symptoms and was placed on clindamycin.  He was told at that time if he had increased swelling and pain then he should come to the hospital.  The patient reports having dramatic increase in swelling since that time and came for evaluation.  Patient is unable to ambulate due to the pain.  His pain goes from his toes to just below his right knee.  No radiation of the pain.  Denies fevers, chills, nausea, vomiting.  Denies chest pain and shortness of breath.  Assessment & Plan:   Principal Problem:   Cellulitis of right foot Active Problems:   Chronic obstructive pulmonary disease (HCC)   Hypertension secondary to other renal disorders   ESRD (end stage renal disease) (HCC)   Chronic diastolic heart failure (HCC)   Cellulitis of right foot Pt is currently continued on rocephin with flagy ABI's noted to be 0.63 on R and 0.44 on L A1c of 5.2 Cont with analgesia as needed See below. Erythema is improving with current regimen and pt agrees infection seems to be improving End-stage renal disease on dialysis Nephrology consulted Cont with HD per Nephrology. Pt reports missing most recent HD. Last HD was on [id number] Chronic diastolic  heart failure with hypertension Euvolemic on exam today Cont volume management with HD COPD Continue home inhalers   DVT prophylaxis: Lovenox subq Code Status: DNR Family Communication: Pt in room, family not at bedside  Status is: Inpatient  Remains inpatient appropriate because:Inpatient level of care appropriate due to severity of illness  Dispo: The patient is from: Home              Anticipated d/c is to: Home              Patient currently is not medically stable to d/c.   Difficult to place patient No       Consultants:  Nephrology  Procedures:    Antimicrobials: Anti-infectives (From admission, onward)    Start     Dose/Rate Route Frequency Ordered Stop   09/16/21 1000  cefTRIAXone (ROCEPHIN) 2 g in sodium chloride 0.9 % 100 mL IVPB       See Hyperspace for full Linked Orders Report.   2 g 200 mL/hr over 30 Minutes Intravenous Every 24 hours 09/15/21 1916 09/23/21 0959   09/15/21 1916  metroNIDAZOLE (FLAGYL) IVPB 500 mg       See Hyperspace for full Linked Orders Report.   500 mg 100 mL/hr over 60 Minutes Intravenous Every 8 hours 09/15/21 1916 09/22/21 1929   09/15/21 1800  vancomycin (VANCOCIN) IVPB 1000 mg/200 mL premix        1,000 mg  200 mL/hr over 60 Minutes Intravenous  Once 09/15/21 1751 09/15/21 1917   09/15/21 1800  ceFEPIme (MAXIPIME) 2 g in sodium chloride 0.9 % 100 mL IVPB        2 g 200 mL/hr over 30 Minutes Intravenous  Once 09/15/21 1751 09/15/21 1817       Subjective: Reports foot remains painful and swollen, but redness has improved  Objective: Vitals:   09/16/21 0259 09/16/21 0532 09/16/21 0943 09/16/21 1411  BP: 123/71 110/63  (!) 115/54  Pulse: 80 75    Resp: 19 18    Temp: 98.3 F (36.8 C) 98.7 F (37.1 C)    TempSrc: Oral Oral    SpO2: 99% 96% 94%   Weight:      Height:        Intake/Output Summary (Last 24 hours) at 09/16/2021 1611 Last data filed at 09/16/2021 1549 Gross per 24 hour  Intake 1170.58 ml  Output 300  ml  Net 870.58 ml   Filed Weights   09/15/21 1737  Weight: 79.4 kg    Examination: General exam: Awake, laying in bed, in nad Respiratory system: Normal respiratory effort, no wheezing Cardiovascular system: regular rate, s1, s2 Gastrointestinal system: Soft, nondistended, positive BS Central nervous system: CN2-12 grossly intact, strength intact Extremities: Perfused, no clubbing Skin: Normal skin turgor, RLE with erythema, swelling, ulcerations of distal toes, no active drainage, see image Psychiatry: Mood normal // no visual hallucinations          Data Reviewed: I have personally reviewed following labs and imaging studies  CBC: Recent Labs  Lab 09/15/21 1504 09/16/21 0642  WBC 7.8 7.0  NEUTROABS 6.0  --   HGB 10.6* 9.0*  HCT 32.3* 27.2*  MCV 106.6* 104.2*  PLT 160 0000000*   Basic Metabolic Panel: Recent Labs  Lab 09/15/21 1504 09/16/21 0642  NA 136 136  K 4.7 4.6  CL 96* 100  CO2 24 22  GLUCOSE 84 91  BUN 61* 66*  CREATININE 13.07* 13.84*  CALCIUM 8.5* 8.2*   GFR: Estimated Creatinine Clearance: 4.7 mL/min (A) (by C-G formula based on SCr of 13.84 mg/dL (H)). Liver Function Tests: No results for input(s): AST, ALT, ALKPHOS, BILITOT, PROT, ALBUMIN in the last 168 hours. No results for input(s): LIPASE, AMYLASE in the last 168 hours. No results for input(s): AMMONIA in the last 168 hours. Coagulation Profile: No results for input(s): INR, PROTIME in the last 168 hours. Cardiac Enzymes: No results for input(s): CKTOTAL, CKMB, CKMBINDEX, TROPONINI in the last 168 hours. BNP (last 3 results) No results for input(s): PROBNP in the last 8760 hours. HbA1C: Recent Labs    09/15/21 1504  HGBA1C 5.2   CBG: No results for input(s): GLUCAP in the last 168 hours. Lipid Profile: No results for input(s): CHOL, HDL, LDLCALC, TRIG, CHOLHDL, LDLDIRECT in the last 72 hours. Thyroid Function Tests: No results for input(s): TSH, T4TOTAL, FREET4, T3FREE,  THYROIDAB in the last 72 hours. Anemia Panel: No results for input(s): VITAMINB12, FOLATE, FERRITIN, TIBC, IRON, RETICCTPCT in the last 72 hours. Sepsis Labs: Recent Labs  Lab 09/15/21 1802  LATICACIDVEN 1.2    Recent Results (from the past 240 hour(s))  Culture, blood (routine x 2)     Status: None (Preliminary result)   Collection Time: 09/15/21  2:56 PM   Specimen: Left Antecubital; Blood  Result Value Ref Range Status   Specimen Description LEFT ANTECUBITAL  Final   Special Requests   Final    Blood Culture  adequate volume BOTTLES DRAWN AEROBIC AND ANAEROBIC   Culture   Final    NO GROWTH < 24 HOURS Performed at [facility], [address], [place], [place] [address]    Report Status PENDING  Incomplete  Culture, blood (routine x 2)     Status: None (Preliminary result)   Collection Time: 09/15/21  3:04 PM   Specimen: BLOOD LEFT HAND  Result Value Ref Range Status   Specimen Description BLOOD LEFT HAND  Final   Special Requests   Final    Blood Culture results may not be optimal due to an excessive volume of blood received in culture bottles BOTTLES DRAWN AEROBIC AND ANAEROBIC   Culture   Final    NO GROWTH < 24 HOURS Performed at [facility], [address] [place], [place] [address]    Report Status PENDING  Incomplete  Resp Panel by RT-PCR (Flu A&B, Covid) Nasopharyngeal Swab     Status: None   Collection Time: 09/15/21  5:54 PM   Specimen: Nasopharyngeal Swab; Nasopharyngeal(NP) swabs in vial transport medium  Result Value Ref Range Status   SARS Coronavirus 2 by RT PCR NEGATIVE NEGATIVE Final    Comment: (NOTE) SARS-CoV-2 target nucleic acids are NOT DETECTED.  The SARS-CoV-2 RNA is generally detectable in upper respiratory specimens during the acute phase of infection. The lowest concentration of SARS-CoV-2 viral copies this assay can detect is 138 copies/mL. A negative result does not preclude SARS-Cov-2 infection and should not be used as the sole basis for  treatment or other patient management decisions. A negative result may occur with  improper specimen collection/handling, submission of specimen other than nasopharyngeal swab, presence of viral mutation(s) within the areas targeted by this assay, and inadequate number of viral copies(<138 copies/mL). A negative result must be combined with clinical observations, patient history, and epidemiological information. The expected result is Negative.  Fact Sheet for Patients:  EntrepreneurPulse.com.au  Fact Sheet for Healthcare Providers:  IncredibleEmployment.be  This test is no t yet approved or cleared by the [place] FDA and  has been authorized for detection and/or diagnosis of SARS-CoV-2 by FDA under an Emergency Use Authorization (EUA). This EUA will remain  in effect (meaning this test can be used) for the duration of the COVID-19 declaration under Section 564(b)(1) of the Act, 21 U.S.C.section 360bbb-3(b)(1), unless the authorization is terminated  or revoked sooner.       Influenza A by PCR NEGATIVE NEGATIVE Final   Influenza B by PCR NEGATIVE NEGATIVE Final    Comment: (NOTE) The Xpert Xpress SARS-CoV-2/FLU/RSV plus assay is intended as an aid in the diagnosis of influenza from Nasopharyngeal swab specimens and should not be used as a sole basis for treatment. Nasal washings and aspirates are unacceptable for Xpert Xpress SARS-CoV-2/FLU/RSV testing.  Fact Sheet for Patients: EntrepreneurPulse.com.au  Fact Sheet for Healthcare Providers: IncredibleEmployment.be  This test is not yet approved or cleared by the [place] FDA and has been authorized for detection and/or diagnosis of SARS-CoV-2 by FDA under an Emergency Use Authorization (EUA). This EUA will remain in effect (meaning this test can be used) for the duration of the COVID-19 declaration under Section 564(b)(1) of the Act, 21  U.S.C. section 360bbb-3(b)(1), unless the authorization is terminated or revoked.  Performed at [facility], [address] [place], [place] [address]      Radiology Studies: US ARTERIAL ABI (SCREENING LOWER EXTREMITY)  Result Date: 09/16/2021 CLINICAL DATA:  71 year old male with a history faint pulses  EXAM: NONINVASIVE PHYSIOLOGIC VASCULAR STUDY OF BILATERAL LOWER EXTREMITIES TECHNIQUE: Evaluation of both lower extremities was performed at rest, including calculation of ankle-brachial indices, multiple segmental pressure evaluation, segmental Doppler and segmental pulse volume recording. COMPARISON:  None. FINDINGS: Right ABI:  0.63 Left ABI:  0.44 Right Lower Extremity: Segmental Doppler at the right ankle demonstrates monophasic posterior tibial artery and biphasic dorsalis pedis Left Lower Extremity: Segmental Doppler at the left ankle demonstrates monophasic waveforms IMPRESSION: Resting ABI of the bilateral lower extremities in the moderate range arterial occlusive disease, though would likely decrease after exercise exam. Segmental Doppler at the ankles demonstrates evidence of tibial or more proximal occlusive disease. Bilaterally, worst on the left. Signed, Dulcy Fanny. Dellia Nims, RPVI [facility] Radiology Electronically Signed   By: Corrie Mckusick D.O.   On: 09/16/2021 10:27   DG Foot Complete Right  Result Date: 09/15/2021 CLINICAL DATA:  Swelling EXAM: RIGHT FOOT COMPLETE - 3+ VIEW COMPARISON:  None. FINDINGS: There is no acute osseous abnormality. There is moderate to severe first MTP joint degenerative arthritis. There is no frank bony destruction or osseous erosion. There is diffuse soft tissue swelling of the foot. Extensive vascular calcifications. Plantar dorsal calcaneal spurring. IMPRESSION: Extensive soft tissue swelling of the foot. No acute osseous abnormality. Moderate-severe first MTP osteoarthritis. Electronically Signed   By:  Maurine Simmering M.D.   On: 09/15/2021 15:08    Scheduled Meds:  aspirin EC  325 mg Oral QHS   calcium carbonate  500 mg Oral QHS   Chlorhexidine Gluconate Cloth  6 each Topical Daily   [START ON 09/17/2021] Chlorhexidine Gluconate Cloth  6 each Topical Q0600   enoxaparin (LOVENOX) injection  30 mg Subcutaneous Q24H   fluticasone furoate-vilanterol  1 puff Inhalation Daily   metoprolol tartrate  25 mg Oral Q1400   multivitamin  1 tablet Oral QHS   sucroferric oxyhydroxide  1,000 mg Oral TID WC   tamsulosin  0.4 mg Oral QHS   umeclidinium bromide  1 puff Inhalation Daily   Continuous Infusions:  cefTRIAXone (ROCEPHIN)  IV Stopped (09/16/21 1043)   And   metronidazole Stopped (09/16/21 1505)     LOS: 1 day   Marylu Lund, MD [facility] Pager On Amion  If 7PM-7AM, please contact night-coverage 09/16/2021, 4:11 PM

## 2021-09-16 NOTE — Consult Note (Signed)
[facility]  INPATIENT CONSULTATION  Reason for Consultation: EESRD Requesting Provider: Dr. Wyline Copas  HPI: Kerry Underwood is an 70 y.o. male with a PMH sig for HTN, HLD, CHF, COPD, and ESRD on HD TTS who is now seen at the request of Dr Dyann Kief for management of ESRD and provision of dialysis.  Currently admitted since 10/7 for R foot cellulitis that failed outpt management - tried keflex then clindamycin but had worsening so admitted now on rocephin and flagyl with improvement.  He is due for dialysis today.  He has no other complaints.  TDC has been working fine.  Bld cx pending NG < 24h.  PMH: Past Medical History:  Diagnosis Date   Anemia    CHF (congestive heart failure) (HCC)    COPD (chronic obstructive pulmonary disease) ([place])    Full dentures    Hypertension    Pneumonia    Pyelonephritis 09/11/2017   Renal disorder    Stroke [facility])    " A few mini strokes, I didn't know it "   PSH: Past Surgical History:  Procedure Laterality Date   BASCILIC VEIN TRANSPOSITION Right 04/19/2020   Procedure: Basilic Vein Transposition Right Arm;  Surgeon: Waynetta Sandy, MD;  Location: [place];  Service: Vascular;  Laterality: Right;   COLON SURGERY     INSERTION OF DIALYSIS CATHETER Right 04/19/2020   Procedure: Insertion Of Right Internal Jugular Dialysis Catheter;  Surgeon: Waynetta Sandy, MD;  Location: [facility];  Service: Vascular;  Laterality: Right;   MULTIPLE TOOTH EXTRACTIONS       Past Medical History:  Diagnosis Date   Anemia    CHF (congestive heart failure) ([place])    COPD (chronic obstructive pulmonary disease) ([place])    Full dentures    Hypertension    Pneumonia    Pyelonephritis 09/11/2017   Renal disorder    Stroke [facility])    " A few mini strokes, I didn't know it "    Medications:  I have reviewed the patient's current medications.   Medications Prior to Admission  Medication Sig Dispense Refill   albuterol (VENTOLIN HFA) 108 (90  Base) MCG/ACT inhaler Inhale 2 puffs into the lungs every 6 (six) hours as needed for wheezing or shortness of breath. 18 g 3   aspirin EC 325 MG tablet Take 325 mg by mouth at bedtime.     Calcium Carbonate 500 MG CHEW Chew 500 mg by mouth at bedtime.     Cholecalciferol (VITAMIN D3 PO) Take by mouth daily.     clindamycin (CLEOCIN) 300 MG capsule Take 300 mg by mouth 4 (four) times daily.     fluticasone furoate-vilanterol (BREO ELLIPTA) 200-25 MCG/INH AEPB Inhale 1 puff into the lungs daily. 60 each 2   HYDROcodone-acetaminophen (NORCO/VICODIN) 5-325 MG tablet Take 1 tablet by mouth every 4 (four) hours as needed.     metoprolol tartrate (LOPRESSOR) 25 MG tablet Take 1 tablet (25 mg total) by mouth 2 (two) times daily. (Patient taking differently: Take 25 mg by mouth daily at 2 PM.) 180 tablet 1   sucroferric oxyhydroxide (VELPHORO) 500 MG chewable tablet Chew 1,000 mg by mouth 3 (three) times daily with meals.     tamsulosin (FLOMAX) 0.4 MG CAPS capsule Take 0.4 mg by mouth at bedtime.     tiotropium (SPIRIVA HANDIHALER) 18 MCG inhalation capsule Place 1 capsule (18 mcg total) into inhaler and inhale daily. 30 capsule 2   B Complex-C-Folic Acid (DIALYVITE Q000111Q PO) Take  0.8 mg by mouth at bedtime. (Patient not taking: No sig reported)      ALLERGIES:   Allergies  Allergen Reactions   Strawberry (Diagnostic)    Strawberry Extract     Other reaction(s): Unknown    FAM HX: Family History  Problem Relation Age of Onset   Alzheimer's disease Mother     Social History:   reports that he has been smoking cigarettes. He has been smoking an average of .5 packs per day. He has quit using smokeless tobacco.  His smokeless tobacco use included chew. He reports that he does not currently use drugs after having used the following drugs: Marijuana. He reports that he does not drink alcohol.  ROS: 12 system ROS per HPI above  Blood pressure (!) 115/54, pulse 75, temperature 98.7 F (37.1 C),  temperature source Oral, resp. rate 18, height '5\' 5"'$  (1.651 m), weight 79.4 kg, SpO2 94 %. PHYSICAL EXAM: Gen: elderly man lying in bed comfortable  Eyes: anicteric ENT:MMM Neck: no JVD CV:  RRR, no rub Abd:  soft Lungs; a few scattered rhonchi he says normal; normal WOB on RA GU: no foley Extr: R 2nd toe s/p onychectomy; mild erythema and swelling of R foot that the patient says is much improved Neuro:grossly nonfocal HD access: RIJ TDC c/d/I; R UE AVF +t/b but deep or small   Results for orders placed or performed during the hospital encounter of 09/15/21 (from the past 48 hour(s))  Culture, blood (routine x 2)     Status: None (Preliminary result)   Collection Time: 09/15/21  2:56 PM   Specimen: Left Antecubital; Blood  Result Value Ref Range   Specimen Description LEFT ANTECUBITAL    Special Requests      Blood Culture adequate volume BOTTLES DRAWN AEROBIC AND ANAEROBIC   Culture      NO GROWTH < 24 HOURS Performed at [facility], [address], [place], [place] [address]    Report Status PENDING   Basic metabolic panel     Status: Abnormal   Collection Time: 09/15/21  3:04 PM  Result Value Ref Range   Sodium 136 135 - 145 mmol/L   Potassium 4.7 3.5 - 5.1 mmol/L   Chloride 96 (L) 98 - 111 mmol/L   CO2 24 22 - 32 mmol/L   Glucose, Bld 84 70 - 99 mg/dL    Comment: Glucose reference range applies only to samples taken after fasting for at least 8 hours.   BUN 61 (H) 8 - 23 mg/dL   Creatinine, Ser 13.07 (H) 0.61 - 1.24 mg/dL   Calcium 8.5 (L) 8.9 - 10.3 mg/dL   GFR, Estimated 4 (L) >60 mL/min    Comment: (NOTE) Calculated using the CKD-EPI Creatinine Equation (2021)    Anion gap 16 (H) 5 - 15    Comment: Performed at [facility], [address], [place], [place] [address]  CBC with Differential     Status: Abnormal   Collection Time: 09/15/21  3:04 PM  Result Value Ref Range   WBC 7.8 4.0 - 10.5 K/uL   RBC 3.03 (L) 4.22 - 5.81 MIL/uL   Hemoglobin 10.6 (L) 13.0 -  17.0 g/dL   HCT 32.3 (L) 39.0 - 52.0 %   MCV 106.6 (H) 80.0 - 100.0 fL   MCH 35.0 (H) 26.0 - 34.0 pg   MCHC 32.8 30.0 - 36.0 g/dL   RDW 15.1 11.5 - 15.5 %   Platelets 160 150 - 400 K/uL  nRBC 0.0 0.0 - 0.2 %   Neutrophils Relative % 77 %   Neutro Abs 6.0 1.7 - 7.7 K/uL   Lymphocytes Relative 11 %   Lymphs Abs 0.9 0.7 - 4.0 K/uL   Monocytes Relative 7 %   Monocytes Absolute 0.6 0.1 - 1.0 K/uL   Eosinophils Relative 4 %   Eosinophils Absolute 0.3 0.0 - 0.5 K/uL   Basophils Relative 1 %   Basophils Absolute 0.0 0.0 - 0.1 K/uL   Immature Granulocytes 0 %   Abs Immature Granulocytes 0.02 0.00 - 0.07 K/uL    Comment: Performed at [facility], [address], [place], [place] [address]  Culture, blood (routine x 2)     Status: None (Preliminary result)   Collection Time: 09/15/21  3:04 PM   Specimen: BLOOD LEFT HAND  Result Value Ref Range   Specimen Description BLOOD LEFT HAND    Special Requests      Blood Culture results may not be optimal due to an excessive volume of blood received in culture bottles BOTTLES DRAWN AEROBIC AND ANAEROBIC   Culture      NO GROWTH < 24 HOURS Performed at [facility], [address] [place], [place] [address]    Report Status PENDING   Sedimentation rate     Status: Abnormal   Collection Time: 09/15/21  3:04 PM  Result Value Ref Range   Sed Rate 75 (H) 0 - 16 mm/hr    Comment: Performed at [facility], [address], [place], [place] [address]  C-reactive protein     Status: Abnormal   Collection Time: 09/15/21  3:04 PM  Result Value Ref Range   CRP 4.6 (H) <1.0 mg/dL    Comment: Performed at [facility], [address], [place], [place] [address]  Hemoglobin A1c     Status: None   Collection Time: 09/15/21  3:04 PM  Result Value Ref Range   Hgb A1c MFr Bld 5.2 4.8 - 5.6 %    Comment: (NOTE) Pre diabetes:          5.7%-6.4%  Diabetes:              >6.4%  Glycemic control for   <7.0% adults with diabetes    Mean Plasma Glucose 102.54  mg/dL    Comment: Performed at [facility] [address], [place],  [address]  Resp Panel by RT-PCR (Flu A&B, Covid) Nasopharyngeal Swab     Status: None   Collection Time: 09/15/21  5:54 PM   Specimen: Nasopharyngeal Swab; Nasopharyngeal(NP) swabs in vial transport medium  Result Value Ref Range   SARS Coronavirus 2 by RT PCR NEGATIVE NEGATIVE    Comment: (NOTE) SARS-CoV-2 target nucleic acids are NOT DETECTED.  The SARS-CoV-2 RNA is generally detectable in upper respiratory specimens during the acute phase of infection. The lowest concentration of SARS-CoV-2 viral copies this assay can detect is 138 copies/mL. A negative result does not preclude SARS-Cov-2 infection and should not be used as the sole basis for treatment or other patient management decisions. A negative result may occur with  improper specimen collection/handling, submission of specimen other than nasopharyngeal swab, presence of viral mutation(s) within the areas targeted by this assay, and inadequate number of viral copies(<138 copies/mL). A negative result must be combined with clinical observations, patient history, and epidemiological information. The expected result is Negative.  Fact Sheet for Patients:  EntrepreneurPulse.com.au  Fact Sheet for Healthcare Providers:  IncredibleEmployment.be  This test is  no t yet approved or cleared by the [place] and  has been authorized for detection and/or diagnosis of SARS-CoV-2 by FDA under an Emergency Use Authorization (EUA). This EUA will remain  in effect (meaning this test can be used) for the duration of the COVID-19 declaration under Section 564(b)(1) of the Act, 21 U.S.C.section 360bbb-3(b)(1), unless the authorization is terminated  or revoked sooner.       Influenza A by PCR NEGATIVE NEGATIVE   Influenza B by PCR NEGATIVE NEGATIVE    Comment: (NOTE) The Xpert Xpress SARS-CoV-2/FLU/RSV plus  assay is intended as an aid in the diagnosis of influenza from Nasopharyngeal swab specimens and should not be used as a sole basis for treatment. Nasal washings and aspirates are unacceptable for Xpert Xpress SARS-CoV-2/FLU/RSV testing.  Fact Sheet for Patients: EntrepreneurPulse.com.au  Fact Sheet for Healthcare Providers: IncredibleEmployment.be  This test is not yet approved or cleared by the [place] FDA and has been authorized for detection and/or diagnosis of SARS-CoV-2 by FDA under an Emergency Use Authorization (EUA). This EUA will remain in effect (meaning this test can be used) for the duration of the COVID-19 declaration under Section 564(b)(1) of the Act, 21 U.S.C. section 360bbb-3(b)(1), unless the authorization is terminated or revoked.  Performed at [facility], [address], [place], [place] [address]   Lactic acid, plasma     Status: None   Collection Time: 09/15/21  6:02 PM  Result Value Ref Range   Lactic Acid, Venous 1.2 0.5 - 1.9 mmol/L    Comment: Performed at [facility], [address] [place], [place] [id number]  Basic metabolic panel     Status: Abnormal   Collection Time: 09/16/21  6:42 AM  Result Value Ref Range   Sodium 136 135 - 145 mmol/L   Potassium 4.6 3.5 - 5.1 mmol/L   Chloride 100 98 - 111 mmol/L   CO2 22 22 - 32 mmol/L   Glucose, Bld 91 70 - 99 mg/dL    Comment: Glucose reference range applies only to samples taken after fasting for at least 8 hours.   BUN 66 (H) 8 - 23 mg/dL   Creatinine, Ser 13.84 (H) 0.61 - 1.24 mg/dL   Calcium 8.2 (L) 8.9 - 10.3 mg/dL   GFR, Estimated 3 (L) >60 mL/min    Comment: (NOTE) Calculated using the CKD-EPI Creatinine Equation (2021)    Anion gap 14 5 - 15    Comment: Performed at [facility], [address] [place], [place] [address]  CBC     Status: Abnormal   Collection Time: 09/16/21  6:42 AM  Result Value Ref Range   WBC 7.0 4.0 - 10.5 K/uL   RBC 2.61 (L)  4.22 - 5.81 MIL/uL   Hemoglobin 9.0 (L) 13.0 - 17.0 g/dL   HCT 27.2 (L) 39.0 - 52.0 %   MCV 104.2 (H) 80.0 - 100.0 fL   MCH 34.5 (H) 26.0 - 34.0 pg   MCHC 33.1 30.0 - 36.0 g/dL   RDW 15.0 11.5 - 15.5 %   Platelets 128 (L) 150 - 400 K/uL   nRBC 0.0 0.0 - 0.2 %    Comment: Performed at [facility], [address], [place], [place] [address]  Hepatitis B surface antigen     Status: None   Collection Time: 09/16/21  6:42 AM  Result Value Ref Range   Hepatitis B Surface Ag NON REACTIVE NON REACTIVE    Comment: Performed at [facility],  [address] [place], [place] [address]  Prealbumin     Status: None   Collection Time: 09/16/21  6:42 AM  Result Value Ref Range   Prealbumin 21.7 18 - 38 mg/dL    Comment: Performed at [facility] [address] [place], [place] [address]    US ARTERIAL ABI (SCREENING LOWER EXTREMITY)  Result Date: 09/16/2021 CLINICAL DATA:  70 year old male with a history faint pulses EXAM: NONINVASIVE PHYSIOLOGIC VASCULAR STUDY OF BILATERAL LOWER EXTREMITIES TECHNIQUE: Evaluation of both lower extremities was performed at rest, including calculation of ankle-brachial indices, multiple segmental pressure evaluation, segmental Doppler and segmental pulse volume recording. COMPARISON:  None. FINDINGS: Right ABI:  0.63 Left ABI:  0.44 Right Lower Extremity: Segmental Doppler at the right ankle demonstrates monophasic posterior tibial artery and biphasic dorsalis pedis Left Lower Extremity: Segmental Doppler at the left ankle demonstrates monophasic waveforms IMPRESSION: Resting ABI of the bilateral lower extremities in the moderate range arterial occlusive disease, though would likely decrease after exercise exam. Segmental Doppler at the ankles demonstrates evidence of tibial or more proximal occlusive disease. Bilaterally, worst on the left. Signed, Dulcy Fanny. Dellia Nims, RPVI [facility] Electronically  Signed   By: Corrie Mckusick D.O.   On: 09/16/2021 10:27   DG Foot Complete Right  Result Date: 09/15/2021 CLINICAL DATA:  Swelling EXAM: RIGHT FOOT COMPLETE - 3+ VIEW COMPARISON:  None. FINDINGS: There is no acute osseous abnormality. There is moderate to severe first MTP joint degenerative arthritis. There is no frank bony destruction or osseous erosion. There is diffuse soft tissue swelling of the foot. Extensive vascular calcifications. Plantar dorsal calcaneal spurring. IMPRESSION: Extensive soft tissue swelling of the foot. No acute osseous abnormality. Moderate-severe first MTP osteoarthritis. Electronically Signed   By: Maurine Simmering M.D.   On: 09/15/2021 15:08    Dialysis Orders: Center: [facility]  on TTS . EDW 83.5kg HD [place] 2K/2.5Ca  Time 4:15 Heparin none. Access RIJ TDC BFR 400 DFR 500    Calcitriol 0.25 mcg po/HD mircera 30 q4 - last Hb 10.4, hasn't been dosed yet.  Assessment/Plan **R foot  cellulitis:  clinically improving on current ctx and flagyl.  Bld cx pending.  Per primary  **ESRD on HD: TTS typically, run today per outpt schedule; logistically if needs to be done tomorrow that's fine and pt aware.  UF to goal.  R foot edema seems more cellulitis related than volume.   **HTN: BP on low side, UF to EDW as able.    **Anemia of CKD: Hb 9 today from 10.6 yesterday.  Give dose of ESA here if remains admitted.   **BMM: cont home binder, low phos diet.  Calcitriol w HD per outpt.   **Nutrition: alb 4.3, renal diet.   Will follow, notify with concerns.   Justin Mend 09/16/2021, 2:31 PM

## 2021-09-17 DIAGNOSIS — I5032 Chronic diastolic (congestive) heart failure: Secondary | ICD-10-CM | POA: Diagnosis not present

## 2021-09-17 DIAGNOSIS — L03115 Cellulitis of right lower limb: Secondary | ICD-10-CM | POA: Diagnosis not present

## 2021-09-17 LAB — COMPREHENSIVE METABOLIC PANEL
ALT: 21 U/L (ref 0–44)
AST: 41 U/L (ref 15–41)
Albumin: 3.5 g/dL (ref 3.5–5.0)
Alkaline Phosphatase: 65 U/L (ref 38–126)
Anion gap: 10 (ref 5–15)
BUN: 25 mg/dL — ABNORMAL HIGH (ref 8–23)
CO2: 27 mmol/L (ref 22–32)
Calcium: 8.6 mg/dL — ABNORMAL LOW (ref 8.9–10.3)
Chloride: 97 mmol/L — ABNORMAL LOW (ref 98–111)
Creatinine, Ser: 6.79 mg/dL — ABNORMAL HIGH (ref 0.61–1.24)
GFR, Estimated: 8 mL/min — ABNORMAL LOW (ref >60)
Glucose, Bld: 97 mg/dL (ref 70–99)
Potassium: 4.3 mmol/L (ref 3.5–5.1)
Sodium: 134 mmol/L — ABNORMAL LOW (ref 135–145)
Total Bilirubin: 0.9 mg/dL (ref 0.3–1.2)
Total Protein: 7.2 g/dL (ref 6.5–8.1)

## 2021-09-17 LAB — CBC
HCT: 29.3 % — ABNORMAL LOW (ref 39.0–52.0)
Hemoglobin: 9.7 g/dL — ABNORMAL LOW (ref 13.0–17.0)
MCH: 34.4 pg — ABNORMAL HIGH (ref 26.0–34.0)
MCHC: 33.1 g/dL (ref 30.0–36.0)
MCV: 103.9 fL — ABNORMAL HIGH (ref 80.0–100.0)
Platelets: 148 10*3/uL — ABNORMAL LOW (ref 150–400)
RBC: 2.82 MIL/uL — ABNORMAL LOW (ref 4.22–5.81)
RDW: 14.8 % (ref 11.5–15.5)
WBC: 8.6 10*3/uL (ref 4.0–10.5)
nRBC: 0 % (ref 0.0–0.2)

## 2021-09-17 MED ORDER — VANCOMYCIN HCL 1500 MG/300ML IV SOLN
1500.0000 mg | Freq: Once | INTRAVENOUS | Status: AC
  Administered 2021-09-17: 1500 mg via INTRAVENOUS
  Filled 2021-09-17: qty 300

## 2021-09-17 NOTE — Procedures (Signed)
   HEMODIALYSIS TREATMENT NOTE:   4 hour HD session completed via RIJ TDC.  Pt asked to end treatment 15 minutes early due to pain right food radiating up his leg.  3.1 liters removed.  All blood was returned.  [place]well [place], RN

## 2021-09-17 NOTE — Plan of Care (Signed)

## 2021-09-17 NOTE — Progress Notes (Signed)
Pharmacy Antibiotic Note  Kerry Underwood is a 70 y.o. male admitted on 09/15/2021 with wound infection not improving.  Pharmacy has been consulted for Vancomycin dosing.  Plan: Vancomycin '1500mg'$  x 1 dose today F/U dialysis schedule for further dosing  Height: '5\' 5"'$  (165.1 cm) Weight: 87.4 kg (192 lb 10.9 oz) IBW/kg (Calculated) : 61.5  Temp (24hrs), Avg:98.7 F (37.1 C), Min:98.5 F (36.9 C), Max:98.8 F (37.1 C)  Recent Labs  Lab 09/15/21 1504 09/15/21 1802 09/16/21 0642 09/17/21 0644  WBC 7.8  --  7.0 8.6  CREATININE 13.07*  --  13.84* 6.79*  LATICACIDVEN  --  1.2  --   --     Estimated Creatinine Clearance: 10 mL/min (A) (by C-G formula based on SCr of 6.79 mg/dL (H)).    Allergies  Allergen Reactions   Strawberry (Diagnostic)    Strawberry Extract     Other reaction(s): Unknown    Antimicrobials this admission: 10/9 Vancomycin >> 10/8 Rocephin >>  10/7 Flagyl >>  Dose adjustments this admission:   Microbiology results:  BCx: pending  UCx: pending   Sputum: pending   MRSA PCR: pending  Thank you for allowing pharmacy to be a part of this patient's care.  Hart Robinsons A 09/17/2021 11:25 AM

## 2021-09-17 NOTE — Progress Notes (Signed)
PROGRESS NOTE    Kerry Underwood  [id number] DOB: [birth year] DOA: 09/15/2021 PCP: [name], FNP    Brief Narrative:  70 y.o. male with a history of end-stage renal disease on dialysis, CHF, COPD, hypertension, history of strokes.  Patient seen for right foot pain and swelling.  The patient has been having pain in his right foot for several weeks.  The main problem has been his second digit with a toenail that was growing vertically and causing impingement of the nailbed with the corners of the nail.  The patient went to urgent care on 9/26.  They removed the nail, gave him a dose of Rocephin and placed him on Keflex.  The patient stopped the Keflex after a few days due to developing diarrhea.  He returned to the urgent care on 10/5 (2 days ago) due to worsening symptoms and was placed on clindamycin.  He was told at that time if he had increased swelling and pain then he should come to the hospital.  The patient reports having dramatic increase in swelling since that time and came for evaluation.  Patient is unable to ambulate due to the pain.  His pain goes from his toes to just below his right knee.  No radiation of the pain.  Denies fevers, chills, nausea, vomiting.  Denies chest pain and shortness of breath.  Assessment & Plan:   Principal Problem:   Cellulitis of right foot Active Problems:   Chronic obstructive pulmonary disease (HCC)   Hypertension secondary to other renal disorders   ESRD (end stage renal disease) (HCC)   Chronic diastolic heart failure (HCC)   Cellulitis of right foot Pt is currently continued on rocephin with flagy ABI's noted to be 0.63 on R and 0.44 on L A1c of 5.2 Cont with analgesia as needed Erythema initially appeared to have improved shortly after initiating IV abx, however today foot appears more edematous. Pulses are felt and foot is warm Will order MRI foot to r/o underlying osteo Will add vanc to cover MRSA End-stage renal disease on  dialysis Nephrology consulted Cont with scheduled HD as tolerated. Underwent HD overnight Chronic diastolic heart failure with hypertension Appears euvolemic on exam today Cont volume management with HD COPD Continue home inhalers   DVT prophylaxis: Lovenox subq Code Status: DNR Family Communication: Pt in room, family not at bedside  Status is: Inpatient  Remains inpatient appropriate because:Inpatient level of care appropriate due to severity of illness  Dispo: The patient is from: Home              Anticipated d/c is to: Home              Patient currently is not medically stable to d/c.   Difficult to place patient No    Consultants:  Nephrology  Procedures:    Antimicrobials: Anti-infectives (From admission, onward)    Start     Dose/Rate Route Frequency Ordered Stop   09/16/21 1000  cefTRIAXone (ROCEPHIN) 2 g in sodium chloride 0.9 % 100 mL IVPB       See Hyperspace for full Linked Orders Report.   2 g 200 mL/hr over 30 Minutes Intravenous Every 24 hours 09/15/21 1916 09/23/21 0959   09/15/21 1916  metroNIDAZOLE (FLAGYL) IVPB 500 mg       See Hyperspace for full Linked Orders Report.   500 mg 100 mL/hr over 60 Minutes Intravenous Every 8 hours 09/15/21 1916 09/22/21 1929   09/15/21 1800  vancomycin (VANCOCIN) IVPB  1000 mg/200 mL premix        1,000 mg 200 mL/hr over 60 Minutes Intravenous  Once 09/15/21 1751 09/15/21 1917   09/15/21 1800  ceFEPIme (MAXIPIME) 2 g in sodium chloride 0.9 % 100 mL IVPB        2 g 200 mL/hr over 30 Minutes Intravenous  Once 09/15/21 1751 09/15/21 1817       Subjective: States foot remains painful, no significant improvement  Objective: Vitals:   09/16/21 2200 09/16/21 2230 09/17/21 0429 09/17/21 0731  BP: 127/69 125/70 123/62   Pulse: 72 71 75   Resp:   18   Temp:   98.8 F (37.1 C)   TempSrc:   Oral   SpO2:   96% 93%  Weight:      Height:        Intake/Output Summary (Last 24 hours) at 09/17/2021 1019 Last data  filed at 09/16/2021 1549 Gross per 24 hour  Intake 640 ml  Output 200 ml  Net 440 ml    Filed Weights   09/15/21 1737 09/16/21 1900  Weight: 79.4 kg 87.4 kg    Examination: General exam: Conversant, in no acute distress Respiratory system: normal chest rise, clear, no audible wheezing Cardiovascular system: regular rhythm, s1-s2 Gastrointestinal system: Nondistended, nontender, pos BS Central nervous system: No seizures, no tremors Extremities: RLE edematous, pos pulses, warm, more edematous  Skin: No rashes, no pallor Psychiatry: Affect normal // no auditory hallucinations   Data Reviewed: I have personally reviewed following labs and imaging studies  CBC: Recent Labs  Lab 09/15/21 1504 09/16/21 0642 09/17/21 0644  WBC 7.8 7.0 8.6  NEUTROABS 6.0  --   --   HGB 10.6* 9.0* 9.7*  HCT 32.3* 27.2* 29.3*  MCV 106.6* 104.2* 103.9*  PLT 160 128* 148*    Basic Metabolic Panel: Recent Labs  Lab 09/15/21 1504 09/16/21 0642 09/17/21 0644  NA 136 136 134*  K 4.7 4.6 4.3  CL 96* 100 97*  CO2 '24 22 27  '$ GLUCOSE 84 91 97  BUN 61* 66* 25*  CREATININE 13.07* 13.84* 6.79*  CALCIUM 8.5* 8.2* 8.6*    GFR: Estimated Creatinine Clearance: 10 mL/min (A) (by C-G formula based on SCr of 6.79 mg/dL (H)). Liver Function Tests: Recent Labs  Lab 09/17/21 0644  AST 41  ALT 21  ALKPHOS 65  BILITOT 0.9  PROT 7.2  ALBUMIN 3.5   No results for input(s): LIPASE, AMYLASE in the last 168 hours. No results for input(s): AMMONIA in the last 168 hours. Coagulation Profile: No results for input(s): INR, PROTIME in the last 168 hours. Cardiac Enzymes: No results for input(s): CKTOTAL, CKMB, CKMBINDEX, TROPONINI in the last 168 hours. BNP (last 3 results) No results for input(s): PROBNP in the last 8760 hours. HbA1C: Recent Labs    09/15/21 1504  HGBA1C 5.2    CBG: No results for input(s): GLUCAP in the last 168 hours. Lipid Profile: No results for input(s): CHOL, HDL,  LDLCALC, TRIG, CHOLHDL, LDLDIRECT in the last 72 hours. Thyroid Function Tests: No results for input(s): TSH, T4TOTAL, FREET4, T3FREE, THYROIDAB in the last 72 hours. Anemia Panel: No results for input(s): VITAMINB12, FOLATE, FERRITIN, TIBC, IRON, RETICCTPCT in the last 72 hours. Sepsis Labs: Recent Labs  Lab 09/15/21 1802  LATICACIDVEN 1.2     Recent Results (from the past 240 hour(s))  Culture, blood (routine x 2)     Status: None (Preliminary result)   Collection Time: 09/15/21  2:56 PM  Specimen: Left Antecubital; Blood  Result Value Ref Range Status   Specimen Description LEFT ANTECUBITAL  Final   Special Requests   Final    Blood Culture adequate volume BOTTLES DRAWN AEROBIC AND ANAEROBIC   Culture   Final    NO GROWTH < 24 HOURS Performed at [facility], [address] [place], [place] [address]    Report Status PENDING  Incomplete  Culture, blood (routine x 2)     Status: None (Preliminary result)   Collection Time: 09/15/21  3:04 PM   Specimen: BLOOD LEFT HAND  Result Value Ref Range Status   Specimen Description BLOOD LEFT HAND  Final   Special Requests   Final    Blood Culture results may not be optimal due to an excessive volume of blood received in culture bottles BOTTLES DRAWN AEROBIC AND ANAEROBIC   Culture   Final    NO GROWTH < 24 HOURS Performed at [facility], [address], [place], [place] [address]    Report Status PENDING  Incomplete  Resp Panel by RT-PCR (Flu A&B, Covid) Nasopharyngeal Swab     Status: None   Collection Time: 09/15/21  5:54 PM   Specimen: Nasopharyngeal Swab; Nasopharyngeal(NP) swabs in vial transport medium  Result Value Ref Range Status   SARS Coronavirus 2 by RT PCR NEGATIVE NEGATIVE Final    Comment: (NOTE) SARS-CoV-2 target nucleic acids are NOT DETECTED.  The SARS-CoV-2 RNA is generally detectable in upper respiratory specimens during the acute phase of infection. The lowest concentration of SARS-CoV-2 viral copies this  assay can detect is 138 copies/mL. A negative result does not preclude SARS-Cov-2 infection and should not be used as the sole basis for treatment or other patient management decisions. A negative result may occur with  improper specimen collection/handling, submission of specimen other than nasopharyngeal swab, presence of viral mutation(s) within the areas targeted by this assay, and inadequate number of viral copies(<138 copies/mL). A negative result must be combined with clinical observations, patient history, and epidemiological information. The expected result is Negative.  Fact Sheet for Patients:  EntrepreneurPulse.com.au  Fact Sheet for Healthcare Providers:  IncredibleEmployment.be  This test is no t yet approved or cleared by the [place] FDA and  has been authorized for detection and/or diagnosis of SARS-CoV-2 by FDA under an Emergency Use Authorization (EUA). This EUA will remain  in effect (meaning this test can be used) for the duration of the COVID-19 declaration under Section 564(b)(1) of the Act, 21 U.S.C.section 360bbb-3(b)(1), unless the authorization is terminated  or revoked sooner.       Influenza A by PCR NEGATIVE NEGATIVE Final   Influenza B by PCR NEGATIVE NEGATIVE Final    Comment: (NOTE) The Xpert Xpress SARS-CoV-2/FLU/RSV plus assay is intended as an aid in the diagnosis of influenza from Nasopharyngeal swab specimens and should not be used as a sole basis for treatment. Nasal washings and aspirates are unacceptable for Xpert Xpress SARS-CoV-2/FLU/RSV testing.  Fact Sheet for Patients: EntrepreneurPulse.com.au  Fact Sheet for Healthcare Providers: IncredibleEmployment.be  This test is not yet approved or cleared by the [place] FDA and has been authorized for detection and/or diagnosis of SARS-CoV-2 by FDA under an Emergency Use Authorization (EUA). This EUA will  remain in effect (meaning this test can be used) for the duration of the COVID-19 declaration under Section 564(b)(1) of the Act, 21 U.S.C. section 360bbb-3(b)(1), unless the authorization is terminated or revoked.  Performed at [facility], [address],  [place], [place] [address]      Radiology Studies: US ARTERIAL ABI (SCREENING LOWER EXTREMITY)  Result Date: 09/16/2021 CLINICAL DATA:  70 year old male with a history faint pulses EXAM: NONINVASIVE PHYSIOLOGIC VASCULAR STUDY OF BILATERAL LOWER EXTREMITIES TECHNIQUE: Evaluation of both lower extremities was performed at rest, including calculation of ankle-brachial indices, multiple segmental pressure evaluation, segmental Doppler and segmental pulse volume recording. COMPARISON:  None. FINDINGS: Right ABI:  0.63 Left ABI:  0.44 Right Lower Extremity: Segmental Doppler at the right ankle demonstrates monophasic posterior tibial artery and biphasic dorsalis pedis Left Lower Extremity: Segmental Doppler at the left ankle demonstrates monophasic waveforms IMPRESSION: Resting ABI of the bilateral lower extremities in the moderate range arterial occlusive disease, though would likely decrease after exercise exam. Segmental Doppler at the ankles demonstrates evidence of tibial or more proximal occlusive disease. Bilaterally, worst on the left. Signed, Dulcy Fanny. Dellia Nims, RPVI [facility] Radiology Electronically Signed   By: Corrie Mckusick D.O.   On: 09/16/2021 10:27   DG Foot Complete Right  Result Date: 09/15/2021 CLINICAL DATA:  Swelling EXAM: RIGHT FOOT COMPLETE - 3+ VIEW COMPARISON:  None. FINDINGS: There is no acute osseous abnormality. There is moderate to severe first MTP joint degenerative arthritis. There is no frank bony destruction or osseous erosion. There is diffuse soft tissue swelling of the foot. Extensive vascular calcifications. Plantar dorsal calcaneal spurring. IMPRESSION: Extensive  soft tissue swelling of the foot. No acute osseous abnormality. Moderate-severe first MTP osteoarthritis. Electronically Signed   By: Maurine Simmering M.D.   On: 09/15/2021 15:08    Scheduled Meds:  aspirin EC  325 mg Oral QHS   calcium carbonate  500 mg Oral QHS   Chlorhexidine Gluconate Cloth  6 each Topical Daily   Chlorhexidine Gluconate Cloth  6 each Topical Q0600   enoxaparin (LOVENOX) injection  30 mg Subcutaneous Q24H   feeding supplement (NEPRO CARB STEADY)  237 mL Oral BID BM   fluticasone furoate-vilanterol  1 puff Inhalation Daily   metoprolol tartrate  25 mg Oral Q1400   multivitamin  1 tablet Oral QHS   sucroferric oxyhydroxide  1,000 mg Oral TID WC   tamsulosin  0.4 mg Oral QHS   umeclidinium bromide  1 puff Inhalation Daily   Continuous Infusions:  sodium chloride     sodium chloride     cefTRIAXone (ROCEPHIN)  IV 2 g (09/17/21 0959)   And   metronidazole 500 mg (09/17/21 0517)     LOS: 2 days   Marylu Lund, MD [facility] Pager On Amion  If 7PM-7AM, please contact night-coverage 09/17/2021, 10:19 AM

## 2021-09-18 ENCOUNTER — Inpatient Hospital Stay (HOSPITAL_COMMUNITY): Payer: 59

## 2021-09-18 DIAGNOSIS — I779 Disorder of arteries and arterioles, unspecified: Secondary | ICD-10-CM

## 2021-09-18 DIAGNOSIS — N186 End stage renal disease: Secondary | ICD-10-CM

## 2021-09-18 DIAGNOSIS — L03115 Cellulitis of right lower limb: Secondary | ICD-10-CM | POA: Diagnosis not present

## 2021-09-18 DIAGNOSIS — N2889 Other specified disorders of kidney and ureter: Secondary | ICD-10-CM

## 2021-09-18 DIAGNOSIS — I151 Hypertension secondary to other renal disorders: Secondary | ICD-10-CM

## 2021-09-18 DIAGNOSIS — I5032 Chronic diastolic (congestive) heart failure: Secondary | ICD-10-CM | POA: Diagnosis not present

## 2021-09-18 LAB — COMPREHENSIVE METABOLIC PANEL
ALT: 18 U/L (ref 0–44)
AST: 37 U/L (ref 15–41)
Albumin: 2.9 g/dL — ABNORMAL LOW (ref 3.5–5.0)
Alkaline Phosphatase: 52 U/L (ref 38–126)
Anion gap: 12 (ref 5–15)
BUN: 35 mg/dL — ABNORMAL HIGH (ref 8–23)
CO2: 25 mmol/L (ref 22–32)
Calcium: 8.4 mg/dL — ABNORMAL LOW (ref 8.9–10.3)
Chloride: 98 mmol/L (ref 98–111)
Creatinine, Ser: 8.78 mg/dL — ABNORMAL HIGH (ref 0.61–1.24)
GFR, Estimated: 6 mL/min — ABNORMAL LOW (ref >60)
Glucose, Bld: 76 mg/dL (ref 70–99)
Potassium: 4 mmol/L (ref 3.5–5.1)
Sodium: 135 mmol/L (ref 135–145)
Total Bilirubin: 0.8 mg/dL (ref 0.3–1.2)
Total Protein: 6.1 g/dL — ABNORMAL LOW (ref 6.5–8.1)

## 2021-09-18 LAB — CBC
HCT: 25.3 % — ABNORMAL LOW (ref 39.0–52.0)
Hemoglobin: 8.2 g/dL — ABNORMAL LOW (ref 13.0–17.0)
MCH: 34 pg (ref 26.0–34.0)
MCHC: 32.4 g/dL (ref 30.0–36.0)
MCV: 105 fL — ABNORMAL HIGH (ref 80.0–100.0)
Platelets: 130 10*3/uL — ABNORMAL LOW (ref 150–400)
RBC: 2.41 MIL/uL — ABNORMAL LOW (ref 4.22–5.81)
RDW: 14.7 % (ref 11.5–15.5)
WBC: 6.6 10*3/uL (ref 4.0–10.5)
nRBC: 0 % (ref 0.0–0.2)

## 2021-09-18 MED ORDER — VANCOMYCIN HCL IN DEXTROSE 1-5 GM/200ML-% IV SOLN
1000.0000 mg | INTRAVENOUS | Status: DC
  Filled 2021-09-18: qty 200

## 2021-09-18 MED ORDER — CHLORHEXIDINE GLUCONATE CLOTH 2 % EX PADS
6.0000 | MEDICATED_PAD | Freq: Every day | CUTANEOUS | Status: DC
  Administered 2021-09-19 – 2021-09-22 (×4): 6 via TOPICAL

## 2021-09-18 MED ORDER — DARBEPOETIN ALFA 150 MCG/0.3ML IJ SOSY
150.0000 ug | PREFILLED_SYRINGE | INTRAMUSCULAR | Status: DC
  Filled 2021-09-18: qty 0.3

## 2021-09-18 NOTE — Progress Notes (Signed)
Subjective:  Still having considerable pain in foot -  doesn't think it has improved.  Now on rocephin , flagyl and vanc.  MRI ordered   Objective Vital signs in last 24 hours: Vitals:   09/17/21 0731 09/17/21 1419 09/17/21 2116 09/18/21 0553  BP:  (!) 94/53 (!) 115/52 (!) 151/76  Pulse:  78 78 79  Resp:  '17 17 17  '$ Temp:  98.7 F (37.1 C) 99.5 F (37.5 C) 98.4 F (36.9 C)  TempSrc:  Oral Oral Oral  SpO2: 93% 100% 97% 95%  Weight:      Height:       Weight change:   Intake/Output Summary (Last 24 hours) at 09/18/2021 0739 Last data filed at 09/18/2021 0500 Gross per 24 hour  Intake 1620 ml  Output 480 ml  Net 1140 ml   Dialysis Orders: Center: [facility]  on TTS . EDW 83.5kg HD Bath 2K/2.5Ca  Time 4:15 Heparin none. Access RIJ TDC BFR 400 DFR 500    Calcitriol 0.25 mcg po/HD mircera 30 q4 - last Hb 10.4, hasn't been dosed yet.   Assessment/Plan **R foot  cellulitis:  not sure if clinically improving on current ctx,  flagyl and vanc  Bld cx neg to date.  Per primary-  ordering MRI-  has had poor ABIS   **ESRD on HD: TTS typically, ran Sat night on schedule.  Plan for HD tomorrow also on schedule via [place].  UF to goal.  R foot edema seems more cellulitis related than volume.    **HTN: BP variable, UF to EDW as able.  lopressor and flomax   **Anemia of CKD: Hb 8.2 today from 10.6 on admit.  Give dose of ESA here tomorrow if remains admitted.    **BMM: cont home binder (velphoro), low phos diet.  Calcitriol w HD per outpt.    **Nutrition: alb 4.3, renal diet.    Louis Meckel    Labs: Basic Metabolic Panel: Recent Labs  Lab 09/16/21 [id number] 09/17/21 0644 09/18/21 0409  NA 136 134* 135  K 4.6 4.3 4.0  CL 100 97* 98  CO2 '22 27 25  '$ GLUCOSE 91 97 76  BUN 66* 25* 35*  CREATININE 13.84* 6.79* 8.78*  CALCIUM 8.2* 8.6* 8.4*   Liver Function Tests: Recent Labs  Lab 09/17/21 0644 09/18/21 0409  AST 41 37  ALT 21 18  ALKPHOS 65 52  BILITOT 0.9 0.8  PROT 7.2 6.1*   ALBUMIN 3.5 2.9*   No results for input(s): LIPASE, AMYLASE in the last 168 hours. No results for input(s): AMMONIA in the last 168 hours. CBC: Recent Labs  Lab 09/15/21 1504 09/16/21 0642 09/17/21 0644 09/18/21 0409  WBC 7.8 7.0 8.6 6.6  NEUTROABS 6.0  --   --   --   HGB 10.6* 9.0* 9.7* 8.2*  HCT 32.3* 27.2* 29.3* 25.3*  MCV 106.6* 104.2* 103.9* 105.0*  PLT 160 128* 148* 130*   Cardiac Enzymes: No results for input(s): CKTOTAL, CKMB, CKMBINDEX, TROPONINI in the last 168 hours. CBG: No results for input(s): GLUCAP in the last 168 hours.  Iron Studies: No results for input(s): IRON, TIBC, TRANSFERRIN, FERRITIN in the last 72 hours. Studies/Results: US ARTERIAL ABI (SCREENING LOWER EXTREMITY)  Result Date: 09/16/2021 CLINICAL DATA:  70 year old male with a history faint pulses EXAM: NONINVASIVE PHYSIOLOGIC VASCULAR STUDY OF BILATERAL LOWER EXTREMITIES TECHNIQUE: Evaluation of both lower extremities was performed at rest, including calculation of ankle-brachial indices, multiple segmental pressure evaluation, segmental Doppler and segmental pulse volume  recording. COMPARISON:  None. FINDINGS: Right ABI:  0.63 Left ABI:  0.44 Right Lower Extremity: Segmental Doppler at the right ankle demonstrates monophasic posterior tibial artery and biphasic dorsalis pedis Left Lower Extremity: Segmental Doppler at the left ankle demonstrates monophasic waveforms IMPRESSION: Resting ABI of the bilateral lower extremities in the moderate range arterial occlusive disease, though would likely decrease after exercise exam. Segmental Doppler at the ankles demonstrates evidence of tibial or more proximal occlusive disease. Bilaterally, worst on the left. Signed, Dulcy Fanny. Dellia Nims, RPVI [facility] Radiology Electronically Signed   By: Corrie Mckusick D.O.   On: 09/16/2021 10:27   Medications: Infusions:  sodium chloride     sodium chloride     cefTRIAXone  (ROCEPHIN)  IV 2 g (09/17/21 0959)   And   metronidazole 500 mg (09/18/21 0315)    Scheduled Medications:  aspirin EC  325 mg Oral QHS   calcium carbonate  500 mg Oral QHS   Chlorhexidine Gluconate Cloth  6 each Topical Daily   Chlorhexidine Gluconate Cloth  6 each Topical Q0600   enoxaparin (LOVENOX) injection  30 mg Subcutaneous Q24H   feeding supplement (NEPRO CARB STEADY)  237 mL Oral BID BM   fluticasone furoate-vilanterol  1 puff Inhalation Daily   metoprolol tartrate  25 mg Oral Q1400   multivitamin  1 tablet Oral QHS   sucroferric oxyhydroxide  1,000 mg Oral TID WC   tamsulosin  0.4 mg Oral QHS   umeclidinium bromide  1 puff Inhalation Daily    have reviewed scheduled and prn medications.  Physical Exam: General:  alert, talkative  "ive been sick since ive been on dialysis" Heart: RRR Lungs: mostly clear Abdomen: soft, non tender Extremities: right foot slightly red-  cool to touch-  necrotic second toe Dialysis Access: [facility] and also right AVF-  good thrill and bruit-  has appt 10/12 with [facility]    09/18/2021,7:39 AM  LOS: 3 days

## 2021-09-18 NOTE — Progress Notes (Signed)
PROGRESS NOTE  Kerry Underwood [id number] DOB: November 13, 1951 DOA: 09/15/2021 PCP: [name], FNP  Brief History:  70 y.o. male with a history of end-stage renal disease on dialysis, CHF, COPD, hypertension, history of strokes.  Patient seen for right foot pain and swelling.  The patient has been having pain in his right foot for several weeks.  The main problem has been his second digit with a toenail that was growing vertically and causing impingement of the nailbed with the corners of the nail.  The patient went to urgent care on 9/26.  They removed the nail, gave him a dose of Rocephin and placed him on Keflex.  The patient stopped the Keflex after a few days due to developing diarrhea.  He returned to the urgent care on 10/5 (2 days ago) due to worsening symptoms and was placed on clindamycin.  He was told at that time if he had increased swelling and pain then he should come to the hospital.  The patient reports having dramatic increase in swelling since that time and came for evaluation.  Patient is unable to ambulate due to the pain.  His pain goes from his toes to just below his right knee.  No radiation of the pain.  Denies fevers, chills, nausea, vomiting.  Denies chest pain and shortness of breath. Patient was started on ceftriaxone and metronidazole with minimal improvement in pain and discoloration.  He was given vancomycin 107 and 10/9. When I assumed care 10/10, I was concerned about arterial occlusive disease causing resting ischemic pain and discoloration.  As a result, the patient is being transferred to [facility] for VVS evaluation.    Assessment/Plan: Cellulitis right Foot -see pics below -infection portion appears somewhat better -concerned that pain and persistent discoloration is due to arterial occlusive disease -d/c ceftriaxone/metronidazole -continue Vanc  Arterial Occlusive disease -10/8 Right ABI 0.63 -10/8 Left ABI 0.44 -will likely need  arteriogram -discussed with vascular surgery, Dr. Vira Browns contact him after arrival -Continue ASA  ESRD -TTS -last HD on 10/8 -discussed with Dr. Moshe Cipro that pt will be transferred -continue Velphoro, Renavit  HTN -continue metoprolol  COPD/tobacco abuse -no desire to quit -continue Breo and Incruse COPD/Tobacco abuse -cessation discussed -stable on RA      Status is: Inpatient  Remains inpatient appropriate because:Inpatient level of care appropriate due to severity of illness  Dispo: The patient is from: Home              Anticipated d/c is to: Home              Patient currently is not medically stable to d/c.   Difficult to place patient No        Family Communication:   no Family at bedside  Consultants:  renal, vascular surgery  Code Status:  DNR  DVT Prophylaxis:  [place] Lovenox   Procedures: As Listed in Progress Note Above  Antibiotics: Vanc 10/7, 10/9      Subjective:  Pt complains of pain in left foot.  Denies f/c, cp, sob, n/v/d. Objective: Vitals:   09/17/21 1419 09/17/21 2116 09/18/21 0553 09/18/21 0835  BP: (!) 94/53 (!) 115/52 (!) 151/76   Pulse: 78 78 79   Resp: '17 17 17   '$ Temp: 98.7 F (37.1 C) 99.5 F (37.5 C) 98.4 F (36.9 C)   TempSrc: Oral Oral Oral   SpO2: 100% 97% 95% 96%  Weight:  Height:        Intake/Output Summary (Last 24 hours) at 09/18/2021 0842 Last data filed at 09/18/2021 0500 Gross per 24 hour  Intake 1620 ml  Output 480 ml  Net 1140 ml   Weight change:  Exam:  General:  Pt is alert, follows commands appropriately, not in acute distress HEENT: No icterus, No thrush, No neck mass, [place]/AT Cardiovascular: RRR, S1/S2, no rubs, no gallops Respiratory: diminished BS.  Bibasilar rales. No wheeze Abdomen: Soft/+BS, non tender, non distended, no guarding Extremities: pics of right foot below       Data Reviewed: I have personally reviewed following labs and imaging studies Basic  Metabolic Panel: Recent Labs  Lab 09/15/21 1504 09/16/21 0642 09/17/21 0644 09/18/21 0409  NA 136 136 134* 135  K 4.7 4.6 4.3 4.0  CL 96* 100 97* 98  CO2 '24 22 27 25  '$ GLUCOSE 84 91 97 76  BUN 61* 66* 25* 35*  CREATININE 13.07* 13.84* 6.79* 8.78*  CALCIUM 8.5* 8.2* 8.6* 8.4*   Liver Function Tests: Recent Labs  Lab 09/17/21 0644 09/18/21 0409  AST 41 37  ALT 21 18  ALKPHOS 65 52  BILITOT 0.9 0.8  PROT 7.2 6.1*  ALBUMIN 3.5 2.9*   No results for input(s): LIPASE, AMYLASE in the last 168 hours. No results for input(s): AMMONIA in the last 168 hours. Coagulation Profile: No results for input(s): INR, PROTIME in the last 168 hours. CBC: Recent Labs  Lab 09/15/21 1504 09/16/21 0642 09/17/21 0644 09/18/21 0409  WBC 7.8 7.0 8.6 6.6  NEUTROABS 6.0  --   --   --   HGB 10.6* 9.0* 9.7* 8.2*  HCT 32.3* 27.2* 29.3* 25.3*  MCV 106.6* 104.2* 103.9* 105.0*  PLT 160 128* 148* 130*   Cardiac Enzymes: No results for input(s): CKTOTAL, CKMB, CKMBINDEX, TROPONINI in the last 168 hours. BNP: Invalid input(s): POCBNP CBG: No results for input(s): GLUCAP in the last 168 hours. HbA1C: Recent Labs    09/15/21 1504  HGBA1C 5.2   Urine analysis:    Component Value Date/Time   COLORURINE YELLOW 04/15/2020 2200   APPEARANCEUR TURBID (A) 04/15/2020 2200   LABSPEC 1.010 04/15/2020 2200   PHURINE 6.0 04/15/2020 2200   GLUCOSEU NEGATIVE 04/15/2020 2200   HGBUR SMALL (A) 04/15/2020 2200   BILIRUBINUR NEGATIVE 04/15/2020 2200   KETONESUR NEGATIVE 04/15/2020 2200   PROTEINUR 100 (A) 04/15/2020 2200   NITRITE POSITIVE (A) 04/15/2020 2200   LEUKOCYTESUR LARGE (A) 04/15/2020 2200   Sepsis Labs: '@LABRCNTIP'$ (procalcitonin:4,lacticidven:4) ) Recent Results (from the past 240 hour(s))  Culture, blood (routine x 2)     Status: None (Preliminary result)   Collection Time: 09/15/21  2:56 PM   Specimen: Left Antecubital; Blood  Result Value Ref Range Status   Specimen Description LEFT  ANTECUBITAL  Final   Special Requests   Final    Blood Culture adequate volume BOTTLES DRAWN AEROBIC AND ANAEROBIC   Culture   Final    NO GROWTH 3 DAYS Performed at [facility], [address] [place], [place] [address]    Report Status PENDING  Incomplete  Culture, blood (routine x 2)     Status: None (Preliminary result)   Collection Time: 09/15/21  3:04 PM   Specimen: BLOOD LEFT HAND  Result Value Ref Range Status   Specimen Description BLOOD LEFT HAND  Final   Special Requests   Final    Blood Culture results may not be optimal due to an excessive volume  of blood received in culture bottles BOTTLES DRAWN AEROBIC AND ANAEROBIC   Culture   Final    NO GROWTH 3 DAYS Performed at [facility], [address], [place], [place] [address]    Report Status PENDING  Incomplete  Resp Panel by RT-PCR (Flu A&B, Covid) Nasopharyngeal Swab     Status: None   Collection Time: 09/15/21  5:54 PM   Specimen: Nasopharyngeal Swab; Nasopharyngeal(NP) swabs in vial transport medium  Result Value Ref Range Status   SARS Coronavirus 2 by RT PCR NEGATIVE NEGATIVE Final    Comment: (NOTE) SARS-CoV-2 target nucleic acids are NOT DETECTED.  The SARS-CoV-2 RNA is generally detectable in upper respiratory specimens during the acute phase of infection. The lowest concentration of SARS-CoV-2 viral copies this assay can detect is 138 copies/mL. A negative result does not preclude SARS-Cov-2 infection and should not be used as the sole basis for treatment or other patient management decisions. A negative result may occur with  improper specimen collection/handling, submission of specimen other than nasopharyngeal swab, presence of viral mutation(s) within the areas targeted by this assay, and inadequate number of viral copies(<138 copies/mL). A negative result must be combined with clinical observations, patient history, and epidemiological information. The expected result is Negative.  Fact Sheet for  Patients:  EntrepreneurPulse.com.au  Fact Sheet for Healthcare Providers:  IncredibleEmployment.be  This test is no t yet approved or cleared by the [place] FDA and  has been authorized for detection and/or diagnosis of SARS-CoV-2 by FDA under an Emergency Use Authorization (EUA). This EUA will remain  in effect (meaning this test can be used) for the duration of the COVID-19 declaration under Section 564(b)(1) of the Act, 21 U.S.C.section 360bbb-3(b)(1), unless the authorization is terminated  or revoked sooner.       Influenza A by PCR NEGATIVE NEGATIVE Final   Influenza B by PCR NEGATIVE NEGATIVE Final    Comment: (NOTE) The Xpert Xpress SARS-CoV-2/FLU/RSV plus assay is intended as an aid in the diagnosis of influenza from Nasopharyngeal swab specimens and should not be used as a sole basis for treatment. Nasal washings and aspirates are unacceptable for Xpert Xpress SARS-CoV-2/FLU/RSV testing.  Fact Sheet for Patients: EntrepreneurPulse.com.au  Fact Sheet for Healthcare Providers: IncredibleEmployment.be  This test is not yet approved or cleared by the [place] FDA and has been authorized for detection and/or diagnosis of SARS-CoV-2 by FDA under an Emergency Use Authorization (EUA). This EUA will remain in effect (meaning this test can be used) for the duration of the COVID-19 declaration under Section 564(b)(1) of the Act, 21 U.S.C. section 360bbb-3(b)(1), unless the authorization is terminated or revoked.  Performed at [facility], [address], [place], [place] [address]      Scheduled Meds:  aspirin EC  325 mg Oral QHS   calcium carbonate  500 mg Oral QHS   Chlorhexidine Gluconate Cloth  6 each Topical Daily   Chlorhexidine Gluconate Cloth  6 each Topical Q0600   Chlorhexidine Gluconate Cloth  6 each Topical Q0600   [START ON 09/19/2021] darbepoetin (ARANESP) injection -  DIALYSIS  150 mcg Intravenous Q Tue-HD   enoxaparin (LOVENOX) injection  30 mg Subcutaneous Q24H   feeding supplement (NEPRO CARB STEADY)  237 mL Oral BID BM   fluticasone furoate-vilanterol  1 puff Inhalation Daily   metoprolol tartrate  25 mg Oral Q1400   multivitamin  1 tablet Oral QHS   sucroferric oxyhydroxide  1,000 mg Oral TID WC   tamsulosin  0.4 mg Oral QHS   umeclidinium bromide  1 puff Inhalation Daily   Continuous Infusions:  sodium chloride     sodium chloride     cefTRIAXone (ROCEPHIN)  IV 2 g (09/17/21 0959)   And   metronidazole 500 mg (09/18/21 0315)    Procedures/Studies: DG Chest 2 View  Result Date: 08/23/2021 CLINICAL DATA:  70 year old male with pneumonia EXAM: CHEST - 2 VIEW COMPARISON:  06/29/2021 FINDINGS: Cardiomediastinal silhouette unchanged in size and contour. No evidence of central vascular congestion. No interlobular septal thickening. Unchanged right IJ tunneled hemodialysis catheter. Coarsened interstitial markings, with no confluent airspace disease. No pneumothorax or pleural effusion. No acute displaced fracture. Degenerative changes of the spine. IMPRESSION: No active cardiopulmonary disease. Electronically Signed   By: Corrie Mckusick D.O.   On: 08/23/2021 13:36   US ARTERIAL ABI (SCREENING LOWER EXTREMITY)  Result Date: 09/16/2021 CLINICAL DATA:  70 year old male with a history faint pulses EXAM: NONINVASIVE PHYSIOLOGIC VASCULAR STUDY OF BILATERAL LOWER EXTREMITIES TECHNIQUE: Evaluation of both lower extremities was performed at rest, including calculation of ankle-brachial indices, multiple segmental pressure evaluation, segmental Doppler and segmental pulse volume recording. COMPARISON:  None. FINDINGS: Right ABI:  0.63 Left ABI:  0.44 Right Lower Extremity: Segmental Doppler at the right ankle demonstrates monophasic posterior tibial artery and biphasic dorsalis pedis Left Lower Extremity: Segmental Doppler at the left ankle demonstrates monophasic  waveforms IMPRESSION: Resting ABI of the bilateral lower extremities in the moderate range arterial occlusive disease, though would likely decrease after exercise exam. Segmental Doppler at the ankles demonstrates evidence of tibial or more proximal occlusive disease. Bilaterally, worst on the left. Signed, Dulcy Fanny. Dellia Nims, RPVI [facility] Electronically Signed   By: Corrie Mckusick D.O.   On: 09/16/2021 10:27   DG Foot Complete Right  Result Date: 09/15/2021 CLINICAL DATA:  Swelling EXAM: RIGHT FOOT COMPLETE - 3+ VIEW COMPARISON:  None. FINDINGS: There is no acute osseous abnormality. There is moderate to severe first MTP joint degenerative arthritis. There is no frank bony destruction or osseous erosion. There is diffuse soft tissue swelling of the foot. Extensive vascular calcifications. Plantar dorsal calcaneal spurring. IMPRESSION: Extensive soft tissue swelling of the foot. No acute osseous abnormality. Moderate-severe first MTP osteoarthritis. Electronically Signed   By: Maurine Simmering M.D.   On: 09/15/2021 15:08    Orson Eva, DO  [facility]  If 7PM-7AM, please contact night-coverage [email or web address] Password [email or web address] 09/18/2021, 8:42 AM   LOS: 3 days

## 2021-09-18 NOTE — Plan of Care (Signed)
  Problem: Education: Goal: Knowledge of General Education information will improve Description: Including pain rating scale, medication(s)/side effects and non-pharmacologic comfort measures Outcome: Not Progressing   Problem: Health Behavior/Discharge Planning: Goal: Ability to manage health-related needs will improve Outcome: Progressing   Problem: Clinical Measurements: Goal: Ability to maintain clinical measurements within normal limits will improve Outcome: Progressing Goal: Will remain free from infection Outcome: Not Progressing Goal: Diagnostic test results will improve Outcome: Progressing Goal: Respiratory complications will improve Outcome: Progressing Goal: Cardiovascular complication will be avoided Outcome: Progressing   Problem: Activity: Goal: Risk for activity intolerance will decrease Outcome: Not Progressing   Problem: Nutrition: Goal: Adequate nutrition will be maintained Outcome: Progressing   Problem: Coping: Goal: Level of anxiety will decrease Outcome: Progressing   Problem: Elimination: Goal: Will not experience complications related to bowel motility Outcome: Progressing Goal: Will not experience complications related to urinary retention Outcome: Progressing   Problem: Pain Managment: Goal: General experience of comfort will improve Outcome: Not Progressing   Problem: Safety: Goal: Ability to remain free from injury will improve Outcome: Progressing   Problem: Skin Integrity: Goal: Risk for impaired skin integrity will decrease Outcome: Progressing

## 2021-09-18 NOTE — TOC Initial Note (Signed)
Transition of Care [facility]) - Initial/Assessment Note    Patient Details  Name: Kerry Underwood MRN: [id number] Date of Birth: [birth year]  [facility] CM/SW Contact:    Iona Beard, [place] Phone Number: 09/18/2021, 10:42 AM  Clinical Narrative:                 [facility] consulted for HH/DME needs. CSW spoke with pt in room to complete assessment. Pt states that he live alone and is independent in completing his ADLs. Pt has transportation provided through [facility] for dialysis and takes himself to the doctor when needed. Pt states that he has not had [place] services and does not feel that he would want them if recommended. Pt states that he does not have any DME and does not feel that he would need anything. Pt has dialysis TThSat. TOC to follow.  Expected Discharge Plan: Home/Self Care Barriers to Discharge: Continued Medical Work up   Patient Goals and CMS Choice Patient states their goals for this hospitalization and ongoing recovery are:: Return home CMS Medicare.gov Compare Post Acute Care list provided to:: Patient Choice offered to / list presented to : Patient  Expected Discharge Plan and Services Expected Discharge Plan: Home/Self Care In-house Referral: Clinical Social Work Discharge Planning Services: CM Consult                                          Prior Living Arrangements/Services   Lives with:: Self Patient language and need for interpreter reviewed:: Yes Do you feel safe going back to the place where you live?: Yes      Need for Family Participation in Patient Care: No (Comment) Care giver support system in place?: Yes (comment)   Criminal Activity/Legal Involvement Pertinent to Current Situation/Hospitalization: No - Comment as needed  Activities of Daily Living Home Assistive Devices/Equipment: None ADL Screening (condition at time of admission) Patient's cognitive ability adequate to safely complete daily activities?: Yes Is the patient deaf  or have difficulty hearing?: No Does the patient have difficulty seeing, even when wearing glasses/contacts?: No Does the patient have difficulty concentrating, remembering, or making decisions?: No Patient able to express need for assistance with ADLs?: Yes Does the patient have difficulty dressing or bathing?: No Independently performs ADLs?: No Communication: Independent Dressing (OT): Independent Grooming: Independent Feeding: Independent Bathing: Independent Toileting: Independent In/Out Bed: Independent Walks in Home: Independent Does the patient have difficulty walking or climbing stairs?: No Weakness of Legs: None Weakness of Arms/Hands: None  Permission Sought/Granted                  Emotional Assessment Appearance:: Appears stated age Attitude/Demeanor/Rapport: Engaged Affect (typically observed): Accepting Orientation: : Oriented to Self, Oriented to Place, Oriented to  Time, Oriented to Situation Alcohol / Substance Use: Not Applicable Psych Involvement: No (comment)  Admission diagnosis:  Cellulitis of right foot [L03.115] Cellulitis of right lower extremity [L03.115] ESRD on hemodialysis ([place]) [N18.6, Z99.2] Patient Active Problem List   Diagnosis Date Noted   PAOD (peripheral arterial occlusive disease) ([place]) 09/18/2021   Cellulitis of right foot 09/15/2021   Vitamin D deficiency 08/23/2021   ESRD (end stage renal disease) ([place]) 05/11/2020   Chronic viral hepatitis C ([place]) 04/25/2020   Unspecified protein-calorie malnutrition ([place]) 04/25/2020   Chronic diastolic heart failure ([place]) 04/20/2020   Secondary hyperparathyroidism of renal origin ([place]) 04/20/2020   Anemia due  to chronic kidney disease 04/15/2020   Benign prostatic hyperplasia with urinary retention    Chronic obstructive pulmonary disease ([place])    Hypertension secondary to other renal disorders    PCP:  [name], FNP Pharmacy[facility]   [id number] [id number]: [phone]: [phone]    Social Determinants of Health (SDOH) Interventions    Readmission Risk Interventions Readmission Risk Prevention Plan 09/18/2021  Transportation Screening Complete  HRI or Home Care Consult Complete  Social Work Consult for [place]sel[place] Complete  Palliative Care Screening Not Applicable  Medication Review (Press photographer Complete  Some recent data might be hidden

## 2021-09-18 NOTE — Progress Notes (Signed)
Pharmacy Antibiotic Note  Kerry Underwood is a 70 y.o. male admitted on 09/15/2021 with wound infection not improving.  Pharmacy has been consulted for Vancomycin dosing.  Plan: Vancomycin '1000mg'$  q dialysis session Tuesday Thursday Saturday   Height: '5\' 5"'$  (165.1 cm) Weight: 87.4 kg (192 lb 10.9 oz) IBW/kg (Calculated) : 61.5  Temp (24hrs), Avg:98.9 F (37.2 C), Min:98.4 F (36.9 C), Max:99.5 F (37.5 C)  Recent Labs  Lab 09/15/21 1504 09/15/21 1802 09/16/21 0642 09/17/21 0644 09/18/21 0409  WBC 7.8  --  7.0 8.6 6.6  CREATININE 13.07*  --  13.84* 6.79* 8.78*  LATICACIDVEN  --  1.2  --   --   --      Estimated Creatinine Clearance: 7.7 mL/min (A) (by C-G formula based on SCr of 8.78 mg/dL (H)).    Allergies  Allergen Reactions   Strawberry (Diagnostic)    Strawberry Extract     Other reaction(s): Unknown    Antimicrobials this admission: 10/9 Vancomycin >> 10/8 Rocephin >> 10/9 10/7 Flagyl >>10/9  Dose adjustments this admission:   Microbiology results:  BCx: NGTD  UCx: NGTD  Sputum: negative  MRSA PCR: pending  Thank you for allowing pharmacy to be a part of this patient's care.  Donna Christen Corliss Lamartina 09/18/2021 12:37 PM

## 2021-09-18 NOTE — Progress Notes (Signed)
Patient going to transfer to [facility], room 9, report given to nurse [place].

## 2021-09-18 NOTE — Care Management Important Message (Signed)
Important Message  Patient Details  Name: Kerry Underwood MRN: [id number] Date of Birth: 1951/09/21   Medicare Important Message Given:  Yes     Tommy Medal 09/18/2021, 11:58 AM

## 2021-09-19 DIAGNOSIS — I998 Other disorder of circulatory system: Secondary | ICD-10-CM

## 2021-09-19 DIAGNOSIS — Z820 Family history of epilepsy and other diseases of the nervous system: Secondary | ICD-10-CM

## 2021-09-19 DIAGNOSIS — L0390 Cellulitis, unspecified: Secondary | ICD-10-CM

## 2021-09-19 DIAGNOSIS — L03115 Cellulitis of right lower limb: Secondary | ICD-10-CM | POA: Diagnosis not present

## 2021-09-19 DIAGNOSIS — F17210 Nicotine dependence, cigarettes, uncomplicated: Secondary | ICD-10-CM

## 2021-09-19 MED ORDER — OXYCODONE HCL 5 MG PO TABS
5.0000 mg | ORAL_TABLET | ORAL | Status: DC | PRN
  Administered 2021-09-19 – 2021-09-24 (×9): 10 mg via ORAL
  Administered 2021-09-24 – 2021-09-25 (×3): 5 mg via ORAL
  Filled 2021-09-19 (×3): qty 2
  Filled 2021-09-19: qty 1
  Filled 2021-09-19 (×2): qty 2
  Filled 2021-09-19: qty 1
  Filled 2021-09-19 (×4): qty 2
  Filled 2021-09-19: qty 1

## 2021-09-19 NOTE — Consult Note (Signed)
Hospital Consult    Reason for Consult: Right foot tissue loss with cellulitis Referring Physician: HospitalistForestine Na MRN #:  [id number]  History of Present Illness: This is a 70 y.o. male with history of end-stage renal disease, CHF, COPD, hypertension, tobacco abuse that presents as a transfer from [facility] for evaluation of cellulitis/tissue loss on the right lower extremity.  States his right second toe started changing color about 4 weeks ago.  He thought it was his toenail.  He had the toenail removed about 2 weeks ago and this has not healed.  States he has pain in the foot at night and cannot sleep over the last month. ABIs were obtained at [facility] that were 0.63 on the right 0.44 on the left.  Smokes about 10 to 15 cigarettes a day.  Past Medical History:  Diagnosis Date   Anemia    CHF (congestive heart failure) (HCC)    COPD (chronic obstructive pulmonary disease) ([place])    Full dentures    Hypertension    Pneumonia    Pyelonephritis 09/11/2017   Renal disorder    Stroke [facility])    " A few mini strokes, I didn't know it "    Past Surgical History:  Procedure Laterality Date   BASCILIC VEIN TRANSPOSITION Right 04/19/2020   Procedure: Basilic Vein Transposition Right Arm;  Surgeon: Waynetta Sandy, MD;  Location: [place];  Service: Vascular;  Laterality: Right;   COLON SURGERY     INSERTION OF DIALYSIS CATHETER Right 04/19/2020   Procedure: Insertion Of Right Internal Jugular Dialysis Catheter;  Surgeon: Waynetta Sandy, MD;  Location: [facility] OR;  Service: Vascular;  Laterality: Right;   MULTIPLE TOOTH EXTRACTIONS      Allergies  Allergen Reactions   Strawberry (Diagnostic)    Strawberry Extract     Other reaction(s): Unknown    Prior to Admission medications   Medication Sig Start Date End Date Taking? Authorizing Provider  albuterol (VENTOLIN HFA) 108 (90 Base) MCG/ACT inhaler Inhale 2 puffs into the lungs every 6 (six) hours as needed for  wheezing or shortness of breath. 07/02/21 09/15/21 Yes Barton Dubois, MD  aspirin EC 325 MG tablet Take 325 mg by mouth at bedtime.   Yes [provider]  Calcium Carbonate 500 MG CHEW Chew 500 mg by mouth at bedtime.   Yes [provider]  Cholecalciferol (VITAMIN D3 PO) Take by mouth daily.   Yes [provider]  clindamycin (CLEOCIN) 300 MG capsule Take 300 mg by mouth 4 (four) times daily. 09/13/21  Yes [provider]  fluticasone furoate-vilanterol (BREO ELLIPTA) 200-25 MCG/INH AEPB Inhale 1 puff into the lungs daily. 07/02/21  Yes Barton Dubois, MD  HYDROcodone-acetaminophen (NORCO/VICODIN) 5-325 MG tablet Take 1 tablet by mouth every 4 (four) hours as needed. 09/13/21  Yes [provider]  metoprolol tartrate (LOPRESSOR) 25 MG tablet Take 1 tablet (25 mg total) by mouth 2 (two) times daily. Patient taking differently: Take 25 mg by mouth daily at 2 PM. 07/12/21 09/15/21 Yes [name] F, FNP  sucroferric oxyhydroxide (VELPHORO) 500 MG chewable tablet Chew 1,000 mg by mouth 3 (three) times daily with meals.   Yes [provider]  tamsulosin (FLOMAX) 0.4 MG CAPS capsule Take 0.4 mg by mouth at bedtime. 05/24/20  Yes [provider]  tiotropium (SPIRIVA HANDIHALER) 18 MCG inhalation capsule Place 1 capsule (18 mcg total) into inhaler and inhale daily. 07/02/21 09/15/21 Yes Barton Dubois, MD  B Complex-C-Folic Acid (  DIALYVITE 800 PO) Take 0.8 mg by mouth at bedtime. Patient not taking: No sig reported    [provider]  amLODipine (NORVASC) 5 MG tablet Take 1 tablet (5 mg total) by mouth daily. Patient not taking: No sig reported 04/21/20 06/29/21  Alma Friendly, MD    Social History   Socioeconomic History   Marital status: Single    Spouse name: Not on file   Number of children: Not on file   Years of education: Not on file   Highest education level: Not on file  Occupational History   Not on file  Tobacco Use    Smoking status: Every Day    Packs/day: 0.50    Types: Cigarettes   Smokeless tobacco: Former    Types: Nurse, children's Use: Never used  Substance and Sexual Activity   Alcohol use: No   Drug use: Not Currently    Types: Marijuana   Sexual activity: Not Currently    Birth control/protection: None  Other Topics Concern   Not on file  Social History Narrative   Not on file   Social Determinants of Health   Financial Resource Strain: Not on file  Food Insecurity: Not on file  Transportation Needs: Not on file  Physical Activity: Not on file  Stress: Not on file  Social Connections: Not on file  Intimate Partner Violence: Not on file     Family History  Problem Relation Age of Onset   Alzheimer's disease Mother     ROS: '[x]'$  Positive   '[ ]'$  Negative   '[ ]'$  All sytems reviewed and are negative  Cardiovascular: '[]'$  chest pain/pressure '[]'$  palpitations '[]'$  SOB lying flat '[]'$  DOE '[]'$  pain in legs while walking '[x]'$  pain in legs at rest - right foot '[x]'$  pain in legs at night - right foot '[]'$  non-healing ulcers '[]'$  hx of DVT '[]'$  swelling in legs  Pulmonary: '[]'$  productive cough '[]'$  asthma/wheezing '[]'$  home O2  Neurologic: '[]'$  weakness in '[]'$  arms '[]'$  legs '[]'$  numbness in '[]'$  arms '[]'$  legs '[]'$  hx of CVA '[]'$  mini stroke '[]'$ difficulty speaking or slurred speech '[]'$  temporary loss of vision in one eye '[]'$  dizziness  Hematologic: '[]'$  hx of cancer '[]'$  bleeding problems '[]'$  problems with blood clotting easily  Endocrine:   '[]'$  diabetes '[]'$  thyroid disease  GI '[]'$  vomiting blood '[]'$  blood in stool  GU: '[]'$  CKD/renal failure '[]'$  HD--'[]'$  M/W/F or '[]'$  T/T/S '[]'$  burning with urination '[]'$  blood in urine  Psychiatric: '[]'$  anxiety '[]'$  depression  Musculoskeletal: '[]'$  arthritis '[]'$  joint pain  Integumentary: '[]'$  rashes '[]'$  ulcers  Constitutional: '[]'$  fever '[]'$  chills   Physical Examination  Vitals:   09/18/21 2358 09/19/21 0421  BP: (!) 126/59 132/67  Pulse: 70 70  Resp: 16  17  Temp: 98 F (36.7 C) 98 F (36.7 C)  SpO2: 93% 95%   Body mass index is 31.29 kg/m.  General:  NAD Gait: Not observed HENT: WNL, normocephalic Pulmonary: normal non-labored breathing Cardiac: regular, without  Murmurs, rubs or gallops Abdomen:  soft, NT/ND Vascular Exam/Pulses: 1+ palpable left femoral pulse Weak right femoral pulse that is thready No palpable pedal pulses Dependent rubor right foot with extensive tissue loss in the right toe as pictured below Extremities: With ischemic changes right foot Musculoskeletal: no muscle wasting or atrophy  Neurologic: A&O X 3; Appropriate Affect ; SENSATION: normal; MOTOR FUNCTION:  moving all extremities equally. Speech is fluent/normal     CBC  Component Value Date/Time   WBC 6.6 09/18/2021 0409   RBC 2.41 (L) 09/18/2021 0409   HGB 8.2 (L) 09/18/2021 0409   HCT 25.3 (L) 09/18/2021 0409   HCT 26.1 (L) 04/16/2020 1052   PLT 130 (L) 09/18/2021 0409   MCV 105.0 (H) 09/18/2021 0409   MCH 34.0 09/18/2021 0409   MCHC 32.4 09/18/2021 0409   RDW 14.7 09/18/2021 0409   LYMPHSABS 0.9 09/15/2021 1504   MONOABS 0.6 09/15/2021 1504   EOSABS 0.3 09/15/2021 1504   BASOSABS 0.0 09/15/2021 1504    BMET    Component Value Date/Time   NA 135 09/18/2021 0409   NA 140 08/23/2021 1208   K 4.0 09/18/2021 0409   CL 98 09/18/2021 0409   CO2 25 09/18/2021 0409   GLUCOSE 76 09/18/2021 0409   BUN 35 (H) 09/18/2021 0409   BUN 39 (H) 08/23/2021 1208   CREATININE 8.78 (H) 09/18/2021 0409   CALCIUM 8.4 (L) 09/18/2021 0409   GFRNONAA 6 (L) 09/18/2021 0409   GFRAA 6 (L) 04/20/2020 0545    COAGS: No results found for: INR, PROTIME   Non-Invasive Vascular Imaging:    ABIs [place] 0.63 on the right and 0.44 on the left   ASSESSMENT/PLAN: This is a 70 y.o. male with multiple medical comorbidities who presents as a transfer from [facility] with cellulitis and tissue loss of the right foot as pictured above.  I discussed with him  this is a limb threatening situation consistent with critical limb ischemia.  I have offered him aortogram with lower extremity angiogram in the Cath Lab tomorrow with my partner Dr. Unk Lightning.  Discussed transfemoral access of the left common femoral artery and evaluating lower extremity blood flow.  Discussed this being done under moderate sedation.  Discussed we will evaluate if he has any endovascular options.  May ultimately require open surgical intervention if no endovascular options.  Risk benefits discussed including risk of bleeding, etc.  Please keep n.p.o. after midnight.  We will schedule for tomorrow.     Marty Heck, MD [facility] Office: [place]

## 2021-09-19 NOTE — Progress Notes (Addendum)
Pt admitted to 5w09. Report received from [place], [place]. Pt ambulated with stand by assist from stretcher to bed. Only c/o pain while ambulating on RLE. Pain quickly subsided after weight was removed. VSS. Pt oriented to room, call bell and bed instruction. Safety measures initiated. Call bell activated. Bed is low and brakes are locked. Call bell within reach.

## 2021-09-19 NOTE — Progress Notes (Signed)
Subjective:  no acute events, transferred from [facility]. Still with pain in foot.  Objective Vital signs in last 24 hours: Vitals:   09/18/21 2152 09/18/21 2358 09/19/21 0421 09/19/21 0808  BP: (!) 154/67 (!) 126/59 132/67 124/63  Pulse: 65 70 70 75  Resp: '18 16 17 16  '$ Temp: 97.9 F (36.6 C) 98 F (36.7 C) 98 F (36.7 C) 98 F (36.7 C)  TempSrc: Oral Oral Oral Oral  SpO2: 98% 93% 95% 93%  Weight:      Height:       Weight change:   Intake/Output Summary (Last 24 hours) at 09/19/2021 1026 Last data filed at 09/19/2021 0659 Gross per 24 hour  Intake 960 ml  Output 350 ml  Net 610 ml   Labs: Basic Metabolic Panel: Recent Labs  Lab 09/16/21 0642 09/17/21 0644 09/18/21 0409  NA 136 134* 135  K 4.6 4.3 4.0  CL 100 97* 98  CO2 '22 27 25  '$ GLUCOSE 91 97 76  BUN 66* 25* 35*  CREATININE 13.84* 6.79* 8.78*  CALCIUM 8.2* 8.6* 8.4*   Liver Function Tests: Recent Labs  Lab 09/17/21 0644 09/18/21 0409  AST 41 37  ALT 21 18  ALKPHOS 65 52  BILITOT 0.9 0.8  PROT 7.2 6.1*  ALBUMIN 3.5 2.9*   No results for input(s): LIPASE, AMYLASE in the last 168 hours. No results for input(s): AMMONIA in the last 168 hours. CBC: Recent Labs  Lab 09/15/21 1504 09/16/21 0642 09/17/21 0644 09/18/21 0409  WBC 7.8 7.0 8.6 6.6  NEUTROABS 6.0  --   --   --   HGB 10.6* 9.0* 9.7* 8.2*  HCT 32.3* 27.2* 29.3* 25.3*  MCV 106.6* 104.2* 103.9* 105.0*  PLT 160 128* 148* 130*   Cardiac Enzymes: No results for input(s): CKTOTAL, CKMB, CKMBINDEX, TROPONINI in the last 168 hours. CBG: No results for input(s): GLUCAP in the last 168 hours.  Iron Studies: No results for input(s): IRON, TIBC, TRANSFERRIN, FERRITIN in the last 72 hours. Studies/Results: MR FOOT RIGHT WO CONTRAST  Result Date: 09/18/2021 CLINICAL DATA:  Osteomyelitis, foot, right toes open wound for 2 months EXAM: MRI OF THE RIGHT FOREFOOT WITHOUT CONTRAST TECHNIQUE: Multiplanar, multisequence MR imaging of the right forefoot was  performed. No intravenous contrast was administered. COMPARISON:  Right foot radiograph 09/15/2021 FINDINGS: Extensive motion artifact which significantly degrades image quality. Bones/Joint/Cartilage There is no frank bony destruction. There is mild edema signal within the middle and distal phalanges of all the toes on coronal T2 images, favored to be artifactual. There is no visible confluent low T1 signal, though there is significant motion artifact on the sequences. 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 Ligaments Intact Lisfranc ligament.  Intact collateral ligaments. Muscles and Tendons No significant muscle atrophy. Mild intramuscular edema, as is commonly seen in diabetics. Soft tissues Diffuse soft tissue swelling of the foot. No focal fluid collection is visible. IMPRESSION: Extensive motion artifact which significantly degrades image quality. Mild edema signal within the middle and distal phalanges of all the toes on coronal T2 which is favored to be artifactual. No definite evidence of osteomyelitis. If there is persistent clinical concern, follow-up radiographs and/or MRI could be obtained. Electronically Signed   By: Maurine Simmering M.D.   On: 09/18/2021 15:51   Medications: Infusions:  sodium chloride     sodium chloride     vancomycin      Scheduled Medications:  aspirin EC  325 mg Oral QHS   calcium carbonate  500 mg Oral QHS   Chlorhexidine Gluconate Cloth  6 each Topical Daily   Chlorhexidine Gluconate Cloth  6 each Topical Q0600   Chlorhexidine Gluconate Cloth  6 each Topical Q0600   darbepoetin (ARANESP) injection - DIALYSIS  150 mcg Intravenous Q Tue-HD   enoxaparin (LOVENOX) injection  30 mg Subcutaneous Q24H   feeding supplement (NEPRO CARB STEADY)  237 mL Oral BID BM   fluticasone furoate-vilanterol  1 puff Inhalation Daily   metoprolol tartrate  25 mg Oral Q1400   multivitamin  1 tablet Oral QHS   sucroferric oxyhydroxide  1,000 mg Oral TID WC   tamsulosin  0.4 mg Oral  QHS   umeclidinium bromide  1 puff Inhalation Daily    have reviewed scheduled and prn medications.  Physical Exam: General: nad Heart: RRR Lungs: mostly clear Abdomen: soft, non tender Extremities: right foot slightly red-  cool to touch-  necrotic second toe Dialysis Access: [facility] and also right AVF-  good thrill and bruit-  has appt 10/12 with [facility]   Dialysis Orders: Center: [facility]  on TTS . EDW 83.5kg HD Bath 2K/2.5Ca  Time 4:15 Heparin none. Access RIJ TDC BFR 400 DFR 500    Calcitriol 0.25 mcg po/HD mircera 30 q4 - last Hb 10.4, hasn't been dosed yet.   Assessment/Plan **R foot  cellulitis, PAD, critical limb ischemia:  not sure if clinically improving on current ctx,  flagyl and vanc  Bld cx neg to date.  Per primary and [facility]. Angio planned for 10/12   **ESRD on HD: TTS typically, ran Sat night on schedule.  Plan for HD tomorrow also on schedule via [place].  UF to goal.  R foot edema seems more cellulitis related than volume.    **HTN: BP variable, UF to EDW as able.  lopressor and flomax   **Anemia of CKD: Hb 8.2 10/10 from 10.6 on admit.  ESA to start 10/12   **BMM: cont home binder (velphoro), low phos diet.  Calcitriol w HD per outpt.    **Nutrition: alb 4.3, renal diet.   Kerry Quint, MD [facility] 09/19/2021,10:26 AM  LOS: 4 days

## 2021-09-19 NOTE — Progress Notes (Signed)
PROGRESS NOTE  Kerry Underwood [id number] DOB: 10-Mar-1951 DOA: 09/15/2021 PCP: [name], FNP  Brief History:  70 y.o. male with a history of end-stage renal disease on dialysis, CHF, COPD, hypertension, history of strokes.  Patient seen for right foot pain and swelling.  The patient has been having pain in his right foot for several weeks.  The main problem has been his second digit with a toenail that was growing vertically and causing impingement of the nailbed with the corners of the nail.  The patient went to urgent care on 9/26.  They removed the nail, gave him a dose of Rocephin and placed him on Keflex.  The patient stopped the Keflex after a few days due to developing diarrhea.  He returned to the urgent care on 10/5 (2 days ago) due to worsening symptoms and was placed on clindamycin.  He was told at that time if he had increased swelling and pain then he should come to the hospital.  The patient reports having dramatic increase in swelling since that time and came for evaluation.  Patient is unable to ambulate due to the pain.  His pain goes from his toes to just below his right knee.  No radiation of the pain.  Denies fevers, chills, nausea, vomiting.  Denies chest pain and shortness of breath. Patient was started on ceftriaxone and metronidazole with minimal improvement in pain and discoloration.  He was given vancomycin 107 and 10/9. When I assumed care 10/10, I was concerned about arterial occlusive disease causing resting ischemic pain and discoloration.  As a result, the patient is being transferred to [facility] for VVS evaluation.    Assessment/Plan: Cellulitis right Foot -concerned that pain and persistent discoloration is due to arterial occlusive disease -d/c ceftriaxone/metronidazole -continue Vanc  Arterial Occlusive disease -10/8 Right ABI 0.63 -10/8 Left ABI 0.44 -vascular plans aortogram with lower extremity angiogram  in the AM -leg feels better "dangling"  vs elevated  ESRD -TTS -last HD on 10/8 -HD per renal -continue Velphoro, Renavit  HTN -continue metoprolol  COPD/tobacco abuse -no desire to quit -continue Breo and Incruse COPD/Tobacco abuse -stable on RA      Status is: Inpatient  Remains inpatient appropriate because:Inpatient level of care appropriate due to severity of illness  Dispo: The patient is from: Home              Anticipated d/c is to: Home              Patient currently is not medically stable to d/c.   Difficult to place patient No        Family Communication:   no Family at bedside  Consultants:  renal, vascular surgery  Code Status:  DNR  DVT Prophylaxis:  [name] Lovenox       Subjective: Leg feels better with it hanging off the bed  Objective: Vitals:   09/18/21 2358 09/19/21 0421 09/19/21 0808 09/19/21 1206  BP: (!) 126/59 132/67 124/63 140/65  Pulse: 70 70 75 75  Resp: '16 17 16 16  '$ Temp: 98 F (36.7 C) 98 F (36.7 C) 98 F (36.7 C) 98 F (36.7 C)  TempSrc: Oral Oral Oral Oral  SpO2: 93% 95% 93% 92%  Weight:      Height:        Intake/Output Summary (Last 24 hours) at 09/19/2021 1319 Last data filed at 09/19/2021 0659 Gross per 24 hour  Intake 960 ml  Output 350 ml  Net 610 ml   Weight change:  Exam:  General: Appearance:    Obese male in no acute distress     Lungs:     respirations unlabored  Heart:    Normal heart rate. Normal rhythm. No murmurs, rubs, or gallops.    MS:   All extremities are intact.    Neurologic:   Awake, alert, oriented x 3. No apparent focal neurological           defect.     Data Reviewed: I have personally reviewed following labs and imaging studies Basic Metabolic Panel: Recent Labs  Lab 09/15/21 1504 09/16/21 0642 09/17/21 0644 09/18/21 0409  NA 136 136 134* 135  K 4.7 4.6 4.3 4.0  CL 96* 100 97* 98  CO2 '24 22 27 25  '$ GLUCOSE 84 91 97 76  BUN 61* 66* 25* 35*  CREATININE 13.07* 13.84* 6.79* 8.78*  CALCIUM 8.5* 8.2* 8.6*  8.4*   Liver Function Tests: Recent Labs  Lab 09/17/21 0644 09/18/21 0409  AST 41 37  ALT 21 18  ALKPHOS 65 52  BILITOT 0.9 0.8  PROT 7.2 6.1*  ALBUMIN 3.5 2.9*   No results for input(s): LIPASE, AMYLASE in the last 168 hours. No results for input(s): AMMONIA in the last 168 hours. Coagulation Profile: No results for input(s): INR, PROTIME in the last 168 hours. CBC: Recent Labs  Lab 09/15/21 1504 09/16/21 0642 09/17/21 0644 09/18/21 0409  WBC 7.8 7.0 8.6 6.6  NEUTROABS 6.0  --   --   --   HGB 10.6* 9.0* 9.7* 8.2*  HCT 32.3* 27.2* 29.3* 25.3*  MCV 106.6* 104.2* 103.9* 105.0*  PLT 160 128* 148* 130*   Cardiac Enzymes: No results for input(s): CKTOTAL, CKMB, CKMBINDEX, TROPONINI in the last 168 hours. BNP: Invalid input(s): POCBNP CBG: No results for input(s): GLUCAP in the last 168 hours. HbA1C: No results for input(s): HGBA1C in the last 72 hours.  Urine analysis:    Component Value Date/Time   COLORURINE YELLOW 04/15/2020 2200   APPEARANCEUR TURBID (A) 04/15/2020 2200   LABSPEC 1.010 04/15/2020 2200   PHURINE 6.0 04/15/2020 2200   GLUCOSEU NEGATIVE 04/15/2020 2200   HGBUR SMALL (A) 04/15/2020 2200   BILIRUBINUR NEGATIVE 04/15/2020 2200   KETONESUR NEGATIVE 04/15/2020 2200   PROTEINUR 100 (A) 04/15/2020 2200   NITRITE POSITIVE (A) 04/15/2020 2200   LEUKOCYTESUR LARGE (A) 04/15/2020 2200    Recent Results (from the past 240 hour(s))  Culture, blood (routine x 2)     Status: None (Preliminary result)   Collection Time: 09/15/21  2:56 PM   Specimen: Left Antecubital; Blood  Result Value Ref Range Status   Specimen Description LEFT ANTECUBITAL  Final   Special Requests   Final    Blood Culture adequate volume BOTTLES DRAWN AEROBIC AND ANAEROBIC   Culture   Final    NO GROWTH 4 DAYS Performed at [facility], [address], [place],  [address]    Report Status PENDING  Incomplete  Culture, blood (routine x 2)     Status: None (Preliminary  result)   Collection Time: 09/15/21  3:04 PM   Specimen: BLOOD LEFT HAND  Result Value Ref Range Status   Specimen Description BLOOD LEFT HAND  Final   Special Requests   Final    Blood Culture results may not be optimal due to an excessive volume of blood received in culture bottles BOTTLES DRAWN AEROBIC AND ANAEROBIC   Culture  Final    NO GROWTH 4 DAYS Performed at [facility], [address] [place], [place] [address]    Report Status PENDING  Incomplete  Resp Panel by RT-PCR (Flu A&B, Covid) Nasopharyngeal Swab     Status: None   Collection Time: 09/15/21  5:54 PM   Specimen: Nasopharyngeal Swab; Nasopharyngeal(NP) swabs in vial transport medium  Result Value Ref Range Status   SARS Coronavirus 2 by RT PCR NEGATIVE NEGATIVE Final    Comment: (NOTE) SARS-CoV-2 target nucleic acids are NOT DETECTED.  The SARS-CoV-2 RNA is generally detectable in upper respiratory specimens during the acute phase of infection. The lowest concentration of SARS-CoV-2 viral copies this assay can detect is 138 copies/mL. A negative result does not preclude SARS-Cov-2 infection and should not be used as the sole basis for treatment or other patient management decisions. A negative result may occur with  improper specimen collection/handling, submission of specimen other than nasopharyngeal swab, presence of viral mutation(s) within the areas targeted by this assay, and inadequate number of viral copies(<138 copies/mL). A negative result must be combined with clinical observations, patient history, and epidemiological information. The expected result is Negative.  Fact Sheet for Patients:  EntrepreneurPulse.com.au  Fact Sheet for Healthcare Providers:  IncredibleEmployment.be  This test is no t yet approved or cleared by the [place] FDA and  has been authorized for detection and/or diagnosis of SARS-CoV-2 by FDA under an Emergency Use Authorization  (EUA). This EUA will remain  in effect (meaning this test can be used) for the duration of the COVID-19 declaration under Section 564(b)(1) of the Act, 21 U.S.C.section 360bbb-3(b)(1), unless the authorization is terminated  or revoked sooner.       Influenza A by PCR NEGATIVE NEGATIVE Final   Influenza B by PCR NEGATIVE NEGATIVE Final    Comment: (NOTE) The Xpert Xpress SARS-CoV-2/FLU/RSV plus assay is intended as an aid in the diagnosis of influenza from Nasopharyngeal swab specimens and should not be used as a sole basis for treatment. Nasal washings and aspirates are unacceptable for Xpert Xpress SARS-CoV-2/FLU/RSV testing.  Fact Sheet for Patients: EntrepreneurPulse.com.au  Fact Sheet for Healthcare Providers: IncredibleEmployment.be  This test is not yet approved or cleared by the [place] FDA and has been authorized for detection and/or diagnosis of SARS-CoV-2 by FDA under an Emergency Use Authorization (EUA). This EUA will remain in effect (meaning this test can be used) for the duration of the COVID-19 declaration under Section 564(b)(1) of the Act, 21 U.S.C. section 360bbb-3(b)(1), unless the authorization is terminated or revoked.  Performed at [facility], [address], [place], [place] [address]      Scheduled Meds:  aspirin EC  325 mg Oral QHS   calcium carbonate  500 mg Oral QHS   Chlorhexidine Gluconate Cloth  6 each Topical Daily   Chlorhexidine Gluconate Cloth  6 each Topical Q0600   Chlorhexidine Gluconate Cloth  6 each Topical Q0600   darbepoetin (ARANESP) injection - DIALYSIS  150 mcg Intravenous Q Tue-HD   enoxaparin (LOVENOX) injection  30 mg Subcutaneous Q24H   feeding supplement (NEPRO CARB STEADY)  237 mL Oral BID BM   fluticasone furoate-vilanterol  1 puff Inhalation Daily   metoprolol tartrate  25 mg Oral Q1400   multivitamin  1 tablet Oral QHS   sucroferric oxyhydroxide  1,000 mg Oral TID WC    tamsulosin  0.4 mg Oral QHS   umeclidinium bromide  1 puff Inhalation Daily   Continuous Infusions:  sodium  chloride     sodium chloride     vancomycin      Procedures/Studies: DG Chest 2 View  Result Date: 08/23/2021 CLINICAL DATA:  70 year old male with pneumonia EXAM: CHEST - 2 VIEW COMPARISON:  06/29/2021 FINDINGS: Cardiomediastinal silhouette unchanged in size and contour. No evidence of central vascular congestion. No interlobular septal thickening. Unchanged right IJ tunneled hemodialysis catheter. Coarsened interstitial markings, with no confluent airspace disease. No pneumothorax or pleural effusion. No acute displaced fracture. Degenerative changes of the spine. IMPRESSION: No active cardiopulmonary disease. Electronically Signed   By: Corrie Mckusick D.O.   On: 08/23/2021 13:36   MR FOOT RIGHT WO CONTRAST  Result Date: 09/18/2021 CLINICAL DATA:  Osteomyelitis, foot, right toes open wound for 2 months EXAM: MRI OF THE RIGHT FOREFOOT WITHOUT CONTRAST TECHNIQUE: Multiplanar, multisequence MR imaging of the right forefoot was performed. No intravenous contrast was administered. COMPARISON:  Right foot radiograph 09/15/2021 FINDINGS: Extensive motion artifact which significantly degrades image quality. Bones/Joint/Cartilage There is no frank bony destruction. There is mild edema signal within the middle and distal phalanges of all the toes on coronal T2 images, favored to be artifactual. There is no visible confluent low T1 signal, though there is significant motion artifact on the sequences. 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 Ligaments Intact Lisfranc ligament.  Intact collateral ligaments. Muscles and Tendons No significant muscle atrophy. Mild intramuscular edema, as is commonly seen in diabetics. Soft tissues Diffuse soft tissue swelling of the foot. No focal fluid collection is visible. IMPRESSION: Extensive motion artifact which significantly degrades image quality. Mild edema  signal within the middle and distal phalanges of all the toes on coronal T2 which is favored to be artifactual. No definite evidence of osteomyelitis. If there is persistent clinical concern, follow-up radiographs and/or MRI could be obtained. Electronically Signed   By: Maurine Simmering M.D.   On: 09/18/2021 15:51   US ARTERIAL ABI (SCREENING LOWER EXTREMITY)  Result Date: 09/16/2021 CLINICAL DATA:  70 year old male with a history faint pulses EXAM: NONINVASIVE PHYSIOLOGIC VASCULAR STUDY OF BILATERAL LOWER EXTREMITIES TECHNIQUE: Evaluation of both lower extremities was performed at rest, including calculation of ankle-brachial indices, multiple segmental pressure evaluation, segmental Doppler and segmental pulse volume recording. COMPARISON:  None. FINDINGS: Right ABI:  0.63 Left ABI:  0.44 Right Lower Extremity: Segmental Doppler at the right ankle demonstrates monophasic posterior tibial artery and biphasic dorsalis pedis Left Lower Extremity: Segmental Doppler at the left ankle demonstrates monophasic waveforms IMPRESSION: Resting ABI of the bilateral lower extremities in the moderate range arterial occlusive disease, though would likely decrease after exercise exam. Segmental Doppler at the ankles demonstrates evidence of tibial or more proximal occlusive disease. Bilaterally, worst on the left. Signed, Dulcy Fanny. Dellia Nims, RPVI [facility] Electronically Signed   By: Corrie Mckusick D.O.   On: 09/16/2021 10:27   DG Foot Complete Right  Result Date: 09/15/2021 CLINICAL DATA:  Swelling EXAM: RIGHT FOOT COMPLETE - 3+ VIEW COMPARISON:  None. FINDINGS: There is no acute osseous abnormality. There is moderate to severe first MTP joint degenerative arthritis. There is no frank bony destruction or osseous erosion. There is diffuse soft tissue swelling of the foot. Extensive vascular calcifications. Plantar dorsal calcaneal spurring. IMPRESSION: Extensive soft  tissue swelling of the foot. No acute osseous abnormality. Moderate-severe first MTP osteoarthritis. Electronically Signed   By: Maurine Simmering M.D.   On: 09/15/2021 15:08    Geradine Girt, DO  [facility]  If  7PM-7AM, please contact night-coverage [email or web address] Password [email or web address] 09/19/2021, 1:19 PM   LOS: 4 days

## 2021-09-20 ENCOUNTER — Encounter (HOSPITAL_COMMUNITY): Payer: 59

## 2021-09-20 ENCOUNTER — Ambulatory Visit: Payer: 59 | Admitting: Vascular Surgery

## 2021-09-20 ENCOUNTER — Other Ambulatory Visit (HOSPITAL_COMMUNITY): Payer: 59

## 2021-09-20 ENCOUNTER — Encounter (HOSPITAL_COMMUNITY): Payer: Self-pay | Admitting: Vascular Surgery

## 2021-09-20 ENCOUNTER — Encounter (HOSPITAL_COMMUNITY)
Admission: EM | Disposition: A | Discharge status: Home Health | Payer: Self-pay | Source: Home / Self Care | Attending: Internal Medicine

## 2021-09-20 DIAGNOSIS — L03115 Cellulitis of right lower limb: Secondary | ICD-10-CM | POA: Diagnosis not present

## 2021-09-20 DIAGNOSIS — N186 End stage renal disease: Secondary | ICD-10-CM

## 2021-09-20 DIAGNOSIS — I7092 Chronic total occlusion of artery of the extremities: Secondary | ICD-10-CM

## 2021-09-20 DIAGNOSIS — R110 Nausea: Secondary | ICD-10-CM | POA: Insufficient documentation

## 2021-09-20 DIAGNOSIS — Z992 Dependence on renal dialysis: Secondary | ICD-10-CM

## 2021-09-20 DIAGNOSIS — I70263 Atherosclerosis of native arteries of extremities with gangrene, bilateral legs: Secondary | ICD-10-CM

## 2021-09-20 DIAGNOSIS — M7989 Other specified soft tissue disorders: Secondary | ICD-10-CM

## 2021-09-20 DIAGNOSIS — J449 Chronic obstructive pulmonary disease, unspecified: Secondary | ICD-10-CM

## 2021-09-20 DIAGNOSIS — I998 Other disorder of circulatory system: Secondary | ICD-10-CM

## 2021-09-20 DIAGNOSIS — I151 Hypertension secondary to other renal disorders: Secondary | ICD-10-CM | POA: Diagnosis not present

## 2021-09-20 DIAGNOSIS — F17210 Nicotine dependence, cigarettes, uncomplicated: Secondary | ICD-10-CM

## 2021-09-20 HISTORY — PX: PERIPHERAL VASCULAR BALLOON ANGIOPLASTY: CATH118281

## 2021-09-20 HISTORY — PX: ABDOMINAL AORTOGRAM W/LOWER EXTREMITY: CATH118223

## 2021-09-20 LAB — CBC
HCT: 26.5 % — ABNORMAL LOW (ref 39.0–52.0)
Hemoglobin: 8.7 g/dL — ABNORMAL LOW (ref 13.0–17.0)
MCH: 33.5 pg (ref 26.0–34.0)
MCHC: 32.8 g/dL (ref 30.0–36.0)
MCV: 101.9 fL — ABNORMAL HIGH (ref 80.0–100.0)
Platelets: 164 10*3/uL (ref 150–400)
RBC: 2.6 MIL/uL — ABNORMAL LOW (ref 4.22–5.81)
RDW: 14.8 % (ref 11.5–15.5)
WBC: 6.6 10*3/uL (ref 4.0–10.5)
nRBC: 0 % (ref 0.0–0.2)

## 2021-09-20 LAB — CREATININE, SERUM
Creatinine, Ser: 12.9 mg/dL — ABNORMAL HIGH (ref 0.61–1.24)
GFR, Estimated: 4 mL/min — ABNORMAL LOW (ref >60)

## 2021-09-20 LAB — CULTURE, BLOOD (ROUTINE X 2)
Culture: NO GROWTH
Culture: NO GROWTH
Special Requests: ADEQUATE

## 2021-09-20 LAB — VITAMIN C: Vitamin C: 0.4 mg/dL (ref 0.4–2.0)

## 2021-09-20 LAB — POCT ACTIVATED CLOTTING TIME: Activated Clotting Time: 225 seconds

## 2021-09-20 SURGERY — ABDOMINAL AORTOGRAM W/LOWER EXTREMITY
Anesthesia: LOCAL | Laterality: Right

## 2021-09-20 MED ORDER — HEPARIN (PORCINE) IN NACL 1000-0.9 UT/500ML-% IV SOLN
INTRAVENOUS | Status: DC | PRN
  Administered 2021-09-20 (×2): 500 mL

## 2021-09-20 MED ORDER — VANCOMYCIN HCL IN DEXTROSE 1-5 GM/200ML-% IV SOLN
1000.0000 mg | Freq: Once | INTRAVENOUS | Status: AC
  Administered 2021-09-20: 1000 mg via INTRAVENOUS
  Filled 2021-09-20: qty 200

## 2021-09-20 MED ORDER — PROSOURCE PLUS PO LIQD
30.0000 mL | Freq: Two times a day (BID) | ORAL | Status: DC
  Administered 2021-09-21 – 2021-09-25 (×6): 30 mL via ORAL
  Filled 2021-09-20 (×7): qty 30

## 2021-09-20 MED ORDER — LIDOCAINE HCL (PF) 1 % IJ SOLN
INTRAMUSCULAR | Status: AC
  Filled 2021-09-20: qty 30

## 2021-09-20 MED ORDER — CLOPIDOGREL BISULFATE 75 MG PO TABS
75.0000 mg | ORAL_TABLET | Freq: Every day | ORAL | Status: DC
  Administered 2021-09-21 – 2021-09-25 (×4): 75 mg via ORAL
  Filled 2021-09-20 (×4): qty 1

## 2021-09-20 MED ORDER — FENTANYL CITRATE (PF) 100 MCG/2ML IJ SOLN
INTRAMUSCULAR | Status: DC | PRN
  Administered 2021-09-20 (×3): 50 ug via INTRAVENOUS

## 2021-09-20 MED ORDER — SODIUM CHLORIDE 0.9 % WEIGHT BASED INFUSION: 1.0000 mL/kg/h | INTRAVENOUS | Status: AC

## 2021-09-20 MED ORDER — FENTANYL CITRATE (PF) 100 MCG/2ML IJ SOLN
INTRAMUSCULAR | Status: AC
  Filled 2021-09-20: qty 2

## 2021-09-20 MED ORDER — LIDOCAINE HCL (PF) 1 % IJ SOLN
INTRAMUSCULAR | Status: DC | PRN
  Administered 2021-09-20: 15 mL

## 2021-09-20 MED ORDER — DARBEPOETIN ALFA 150 MCG/0.3ML IJ SOSY
150.0000 ug | PREFILLED_SYRINGE | INTRAMUSCULAR | Status: DC
  Administered 2021-09-21: 150 ug via INTRAVENOUS
  Filled 2021-09-20 (×2): qty 0.3

## 2021-09-20 MED ORDER — MIDAZOLAM HCL 2 MG/2ML IJ SOLN
INTRAMUSCULAR | Status: AC
  Filled 2021-09-20: qty 2

## 2021-09-20 MED ORDER — SODIUM CHLORIDE 0.9% FLUSH
3.0000 mL | Freq: Two times a day (BID) | INTRAVENOUS | Status: DC
  Administered 2021-09-20 – 2021-09-21 (×3): 3 mL via INTRAVENOUS

## 2021-09-20 MED ORDER — PROTAMINE SULFATE 10 MG/ML IV SOLN
INTRAVENOUS | Status: AC
  Filled 2021-09-20: qty 5

## 2021-09-20 MED ORDER — ONDANSETRON HCL 4 MG/2ML IJ SOLN: 4.0000 mg | Freq: Four times a day (QID) | INTRAMUSCULAR | Status: DC | PRN

## 2021-09-20 MED ORDER — LABETALOL HCL 5 MG/ML IV SOLN: 10.0000 mg | INTRAVENOUS | Status: DC | PRN

## 2021-09-20 MED ORDER — HEPARIN (PORCINE) IN NACL 1000-0.9 UT/500ML-% IV SOLN
INTRAVENOUS | Status: AC
  Filled 2021-09-20: qty 1000

## 2021-09-20 MED ORDER — SODIUM CHLORIDE 0.9% FLUSH: 3.0000 mL | INTRAVENOUS | Status: DC | PRN

## 2021-09-20 MED ORDER — HEPARIN SODIUM (PORCINE) 1000 UNIT/ML IJ SOLN
INTRAMUSCULAR | Status: DC | PRN
  Administered 2021-09-20: 2000 [IU] via INTRAVENOUS
  Administered 2021-09-20: 8000 [IU] via INTRAVENOUS

## 2021-09-20 MED ORDER — HYDRALAZINE HCL 20 MG/ML IJ SOLN: 5.0000 mg | INTRAMUSCULAR | Status: DC | PRN

## 2021-09-20 MED ORDER — IODIXANOL 320 MG/ML IV SOLN
INTRAVENOUS | Status: DC | PRN
  Administered 2021-09-20: 160 mL

## 2021-09-20 MED ORDER — SODIUM CHLORIDE 0.9 % IV SOLN: 250.0000 mL | INTRAVENOUS | Status: DC | PRN

## 2021-09-20 MED ORDER — MIDAZOLAM HCL 2 MG/2ML IJ SOLN
INTRAMUSCULAR | Status: DC | PRN
  Administered 2021-09-20 (×2): 1 mg via INTRAVENOUS

## 2021-09-20 MED ORDER — PROTAMINE SULFATE 10 MG/ML IV SOLN
INTRAVENOUS | Status: DC | PRN
  Administered 2021-09-20: 50 mg via INTRAVENOUS

## 2021-09-20 MED ORDER — ATORVASTATIN CALCIUM 40 MG PO TABS
40.0000 mg | ORAL_TABLET | Freq: Every day | ORAL | Status: DC
  Administered 2021-09-20 – 2021-09-25 (×5): 40 mg via ORAL
  Filled 2021-09-20 (×5): qty 1

## 2021-09-20 MED ORDER — ACETAMINOPHEN 325 MG PO TABS
650.0000 mg | ORAL_TABLET | ORAL | Status: DC | PRN
  Administered 2021-09-21: 650 mg via ORAL
  Filled 2021-09-20: qty 2

## 2021-09-20 MED ORDER — HEPARIN SODIUM (PORCINE) 5000 UNIT/ML IJ SOLN
5000.0000 [IU] | Freq: Three times a day (TID) | INTRAMUSCULAR | Status: DC
  Administered 2021-09-20 – 2021-09-22 (×5): 5000 [IU] via SUBCUTANEOUS
  Filled 2021-09-20 (×5): qty 1

## 2021-09-20 SURGICAL SUPPLY — 23 items
CATH NAVICROSS ANGLED 135CM (MICROCATHETER) ×1 IMPLANT
CATH OMNI FLUSH 5F 65CM (CATHETERS) ×1 IMPLANT
CATH QUICKCROSS SUPP .018X90CM (MICROCATHETER) ×1 IMPLANT
CATH QUICKCROSS SUPP .035X90CM (MICROCATHETER) ×1 IMPLANT
DEVICE TORQUE .014-.018 (MISCELLANEOUS) IMPLANT
DEVICE TORQUE .025-.038 (MISCELLANEOUS) ×1 IMPLANT
GLIDEWIRE ADV .035X260CM (WIRE) ×1 IMPLANT
GLIDEWIRE ANGLED NITR .018X260 (WIRE) ×1 IMPLANT
GLIDEWIRE NITREX 0.018X80X5 (WIRE) ×1
GUIDEWIRE ANGLED .035X260CM (WIRE) ×1 IMPLANT
GUIDEWIRE NITREX 0.018X80X5 (WIRE) IMPLANT
KIT MICROPUNCTURE NIT STIFF (SHEATH) ×1 IMPLANT
KIT PV (KITS) ×3 IMPLANT
SHEATH PINNACLE 5F 10CM (SHEATH) ×2 IMPLANT
SHEATH PINNACLE MP 6F 45CM (SHEATH) ×1 IMPLANT
SHEATH PROBE COVER 6X72 (BAG) ×1 IMPLANT
SYR MEDRAD MARK V 150ML (SYRINGE) ×1 IMPLANT
TORQUE DEVICE .014-.018 (MISCELLANEOUS) ×3
TRANSDUCER W/STOPCOCK (MISCELLANEOUS) ×3 IMPLANT
TRAY PV CATH (CUSTOM PROCEDURE TRAY) ×3 IMPLANT
WIRE BENTSON .035X145CM (WIRE) ×1 IMPLANT
WIRE G V18X300CM (WIRE) ×1 IMPLANT
WIRE TORQFLEX AUST .018X40CM (WIRE) ×1 IMPLANT

## 2021-09-20 NOTE — Progress Notes (Signed)
Subjective:  Seen in room. Ongoing foot pain. For angiogram per vascular this am. HD after procedure.   Objective Vital signs in last 24 hours: Vitals:   09/20/21 0429 09/20/21 0811 09/20/21 0820 09/20/21 0904  BP: 130/66 (!) 146/69    Pulse: 69 71 70   Resp: '17 20 18   '$ Temp: 98.4 F (36.9 C) 98.2 F (36.8 C)    TempSrc: Oral Oral    SpO2: 100% 99% 100% 100%  Weight:      Height:       Weight change:  No intake or output data in the 24 hours ending 09/20/21 0922  Labs: Basic Metabolic Panel: Recent Labs  Lab 09/16/21 0642 09/17/21 0644 09/18/21 0409  NA 136 134* 135  K 4.6 4.3 4.0  CL 100 97* 98  CO2 '22 27 25  '$ GLUCOSE 91 97 76  BUN 66* 25* 35*  CREATININE 13.84* 6.79* 8.78*  CALCIUM 8.2* 8.6* 8.4*    Liver Function Tests: Recent Labs  Lab 09/17/21 0644 09/18/21 0409  AST 41 37  ALT 21 18  ALKPHOS 65 52  BILITOT 0.9 0.8  PROT 7.2 6.1*  ALBUMIN 3.5 2.9*    No results for input(s): LIPASE, AMYLASE in the last 168 hours. No results for input(s): AMMONIA in the last 168 hours. CBC: Recent Labs  Lab 09/15/21 1504 09/16/21 0642 09/17/21 0644 09/18/21 0409  WBC 7.8 7.0 8.6 6.6  NEUTROABS 6.0  --   --   --   HGB 10.6* 9.0* 9.7* 8.2*  HCT 32.3* 27.2* 29.3* 25.3*  MCV 106.6* 104.2* 103.9* 105.0*  PLT 160 128* 148* 130*    Cardiac Enzymes: No results for input(s): CKTOTAL, CKMB, CKMBINDEX, TROPONINI in the last 168 hours. CBG: No results for input(s): GLUCAP in the last 168 hours.  Iron Studies: No results for input(s): IRON, TIBC, TRANSFERRIN, FERRITIN in the last 72 hours. Studies/Results: MR FOOT RIGHT WO CONTRAST  Result Date: 09/18/2021 CLINICAL DATA:  Osteomyelitis, foot, right toes open wound for 2 months EXAM: MRI OF THE RIGHT FOREFOOT WITHOUT CONTRAST TECHNIQUE: Multiplanar, multisequence MR imaging of the right forefoot was performed. No intravenous contrast was administered. COMPARISON:  Right foot radiograph 09/15/2021 FINDINGS: Extensive  motion artifact which significantly degrades image quality. Bones/Joint/Cartilage There is no frank bony destruction. There is mild edema signal within the middle and distal phalanges of all the toes on coronal T2 images, favored to be artifactual. There is no visible confluent low T1 signal, though there is significant motion artifact on the sequences. 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 Ligaments Intact Lisfranc ligament.  Intact collateral ligaments. Muscles and Tendons No significant muscle atrophy. Mild intramuscular edema, as is commonly seen in diabetics. Soft tissues Diffuse soft tissue swelling of the foot. No focal fluid collection is visible. IMPRESSION: Extensive motion artifact which significantly degrades image quality. Mild edema signal within the middle and distal phalanges of all the toes on coronal T2 which is favored to be artifactual. No definite evidence of osteomyelitis. If there is persistent clinical concern, follow-up radiographs and/or MRI could be obtained. Electronically Signed   By: Maurine Simmering M.D.   On: 09/18/2021 15:51   Medications: Infusions:  [MAR Hold] sodium chloride     [MAR Hold] sodium chloride     [MAR Hold] vancomycin      Scheduled Medications:  [MAR Hold] aspirin EC  325 mg Oral QHS   [MAR Hold] calcium carbonate  500 mg Oral QHS   [MAR Hold] Chlorhexidine  Gluconate Cloth  6 each Topical Daily   [MAR Hold] Chlorhexidine Gluconate Cloth  6 each Topical Q0600   [MAR Hold] Chlorhexidine Gluconate Cloth  6 each Topical Q0600   [MAR Hold] darbepoetin (ARANESP) injection - DIALYSIS  150 mcg Intravenous Q Tue-HD   [MAR Hold] enoxaparin (LOVENOX) injection  30 mg Subcutaneous Q24H   [MAR Hold] feeding supplement (NEPRO CARB STEADY)  237 mL Oral BID BM   [MAR Hold] fluticasone furoate-vilanterol  1 puff Inhalation Daily   [MAR Hold] metoprolol tartrate  25 mg Oral Q1400   [MAR Hold] multivitamin  1 tablet Oral QHS   [MAR Hold] sucroferric oxyhydroxide   1,000 mg Oral TID WC   [MAR Hold] tamsulosin  0.4 mg Oral QHS   [MAR Hold] umeclidinium bromide  1 puff Inhalation Daily    have reviewed scheduled and prn medications.  Physical Exam: General: nad Heart: RRR Lungs: mostly clear Abdomen: soft, non tender Extremities: right foot slightly red-  cool to touch-  necrotic second toe Dialysis Access: [facility] and also right AVF-  good thrill and bruit-  has appt 10/12 with VVS   Dialysis Orders: Center: [facility]  on TTS . EDW 83.5kg HD [place] 2K/2.5Ca  Time 4:15 Heparin none. Access RIJ TDC BFR 400 DFR 500    Calcitriol 0.25 mcg po/HD mircera 30 q4 - last Hb 10.4, hasn't been dosed yet.   Assessment/Plan **R foot  cellulitis, PAD, critical limb ischemia:  not sure if clinically improving on current ctx,  flagyl and vanc  Bld cx neg to date.  Per primary and VVS. Angio planned for 10/12   **ESRD on HD: TTS typically, ran Sat night on schedule.  Plan for HD today off schedule.  via [facility].  UF to goal.  R foot edema seems more cellulitis related than volume.    **HTN: BP variable, UF to EDW as able.  lopressor and flomax   **Anemia of CKD: Hb 8.2 10/10 from 10.6 on admit.  ESA to start 10/12   **BMM: cont home binder (velphoro), low phos diet.  Calcitriol w HD per outpt.    **Nutrition: alb 4.3, renal diet.   Lynnda Child PA-C [facility] 09/20/2021,9:22 AM

## 2021-09-20 NOTE — Progress Notes (Signed)
Report received from floor nurse, pt refuses to come to Hemodialysis on this evening, he prefers to come tomorrow. Provider made aware.

## 2021-09-20 NOTE — Progress Notes (Signed)
70 year old male that was seen in consultation for critical limb ischemia with tissue loss in the right foot.  He underwent arteriogram earlier today with my partner Dr. Virl Cagey.  Unfortunately he does not have any endovascular options at this time.  I discussed plan for right common femoral endarterectomy with a femoral to distal bypass on Friday as really his only option moving forward.  I am concerned given he appears to have fairly limited outflow into the foot based on arteriogram and bypass may not salvage his foot even with revascularization.  I discussed risk and benefits with the patient.  Ultimately he wants to proceed with surgery and make all attempts at limb salvage.  He is posted for Friday with me.  Ideally getting his dialysis tomorrow will be very helpful for surgery on Friday.  Marty Heck, MD [facility] [place] Office: [place]

## 2021-09-20 NOTE — H&P (Signed)
Hospital Consult   Patient seen and examined this morning prior to left lower extremity angiogram. As noted below, patient with several week history of nonhealing wounds on the foot-Rutherford 5 critical limb ischemia ABIs depressed bilaterally, left greater than right Patient continues to smoke roughly 10 to 15 cigarettes daily No changes in physical exam  After discussing risk and benefits of angiogram in an effort to define and improve lower extremity perfusion in the left foot, Aryash elected to proceed.   Reason for Consult: Right foot tissue loss with cellulitis Referring Physician: HospitalistForestine Na MRN #:  [id number]  History of Present Illness: This is a 70 y.o. male with history of end-stage renal disease, CHF, COPD, hypertension, tobacco abuse that presents as a transfer from [facility] for evaluation of cellulitis/tissue loss on the right lower extremity.  States his right second toe started changing color about 4 weeks ago.  He thought it was his toenail.  He had the toenail removed about 2 weeks ago and this has not healed.  States he has pain in the foot at night and cannot sleep over the last month. ABIs were obtained at [facility] that were 0.63 on the right 0.44 on the left.  Smokes about 10 to 15 cigarettes a day.  Past Medical History:  Diagnosis Date   Anemia    CHF (congestive heart failure) (HCC)    COPD (chronic obstructive pulmonary disease) ([place])    Full dentures    Hypertension    Pneumonia    Pyelonephritis 09/11/2017   Renal disorder    Stroke [facility])    " A few mini strokes, I didn't know it "    Past Surgical History:  Procedure Laterality Date   BASCILIC VEIN TRANSPOSITION Right 04/19/2020   Procedure: Basilic Vein Transposition Right Arm;  Surgeon: Waynetta Sandy, MD;  Location: [place];  Service: Vascular;  Laterality: Right;   COLON SURGERY     INSERTION OF DIALYSIS CATHETER Right 04/19/2020   Procedure: Insertion Of Right Internal  Jugular Dialysis Catheter;  Surgeon: Waynetta Sandy, MD;  Location: [facility] OR;  Service: Vascular;  Laterality: Right;   MULTIPLE TOOTH EXTRACTIONS      Allergies  Allergen Reactions   Strawberry (Diagnostic)    Strawberry Extract     Other reaction(s): Unknown    Prior to Admission medications   Medication Sig Start Date End Date Taking? Authorizing Provider  albuterol (VENTOLIN HFA) 108 (90 Base) MCG/ACT inhaler Inhale 2 puffs into the lungs every 6 (six) hours as needed for wheezing or shortness of breath. 07/02/21 09/15/21 Yes Barton Dubois, MD  aspirin EC 325 MG tablet Take 325 mg by mouth at bedtime.   Yes [provider]  Calcium Carbonate 500 MG CHEW Chew 500 mg by mouth at bedtime.   Yes [provider]  Cholecalciferol (VITAMIN D3 PO) Take by mouth daily.   Yes [provider]  clindamycin (CLEOCIN) 300 MG capsule Take 300 mg by mouth 4 (four) times daily. 09/13/21  Yes [provider]  fluticasone furoate-vilanterol (BREO ELLIPTA) 200-25 MCG/INH AEPB Inhale 1 puff into the lungs daily. 07/02/21  Yes Barton Dubois, MD  HYDROcodone-acetaminophen (NORCO/VICODIN) 5-325 MG tablet Take 1 tablet by mouth every 4 (four) hours as needed. 09/13/21  Yes [provider]  metoprolol tartrate (LOPRESSOR) 25 MG tablet Take 1 tablet (25 mg total) by mouth 2 (two) times daily. Patient taking differently: Take 25 mg by mouth daily at 2 PM. 07/12/21 09/15/21  Yes [name] F, FNP  sucroferric oxyhydroxide (VELPHORO) 500 MG chewable tablet Chew 1,000 mg by mouth 3 (three) times daily with meals.   Yes [provider]  tamsulosin (FLOMAX) 0.4 MG CAPS capsule Take 0.4 mg by mouth at bedtime. 05/24/20  Yes [provider]  tiotropium (SPIRIVA HANDIHALER) 18 MCG inhalation capsule Place 1 capsule (18 mcg total) into inhaler and inhale daily. 07/02/21 09/15/21 Yes Barton Dubois, MD  B Complex-C-Folic Acid (DIALYVITE Q000111Q PO) Take 0.8 mg by  mouth at bedtime. Patient not taking: No sig reported    [provider]  amLODipine (NORVASC) 5 MG tablet Take 1 tablet (5 mg total) by mouth daily. Patient not taking: No sig reported 04/21/20 06/29/21  Alma Friendly, MD    Social History   Socioeconomic History   Marital status: Single    Spouse name: Not on file   Number of children: Not on file   Years of education: Not on file   Highest education level: Not on file  Occupational History   Not on file  Tobacco Use   Smoking status: Every Day    Packs/day: 0.50    Types: Cigarettes   Smokeless tobacco: Former    Types: Nurse, children's Use: Never used  Substance and Sexual Activity   Alcohol use: No   Drug use: Not Currently    Types: Marijuana   Sexual activity: Not Currently    Birth control/protection: None  Other Topics Concern   Not on file  Social History Narrative   Not on file   Social Determinants of Health   Financial Resource Strain: Not on file  Food Insecurity: Not on file  Transportation Needs: Not on file  Physical Activity: Not on file  Stress: Not on file  Social Connections: Not on file  Intimate Partner Violence: Not on file     Family History  Problem Relation Age of Onset   Alzheimer's disease Mother     ROS: '[x]'$  Positive   '[ ]'$  Negative   '[ ]'$  All sytems reviewed and are negative  Cardiovascular: '[]'$  chest pain/pressure '[]'$  palpitations '[]'$  SOB lying flat '[]'$  DOE '[]'$  pain in legs while walking '[x]'$  pain in legs at rest - right foot '[x]'$  pain in legs at night - right foot '[]'$  non-healing ulcers '[]'$  hx of DVT '[]'$  swelling in legs  Pulmonary: '[]'$  productive cough '[]'$  asthma/wheezing '[]'$  home O2  Neurologic: '[]'$  weakness in '[]'$  arms '[]'$  legs '[]'$  numbness in '[]'$  arms '[]'$  legs '[]'$  hx of CVA '[]'$  mini stroke '[]'$ difficulty speaking or slurred speech '[]'$  temporary loss of vision in one eye '[]'$  dizziness  Hematologic: '[]'$  hx of cancer '[]'$  bleeding problems '[]'$  problems with  blood clotting easily  Endocrine:   '[]'$  diabetes '[]'$  thyroid disease  GI '[]'$  vomiting blood '[]'$  blood in stool  GU: '[]'$  CKD/renal failure '[]'$  HD--'[]'$  M/W/F or '[]'$  T/T/S '[]'$  burning with urination '[]'$  blood in urine  Psychiatric: '[]'$  anxiety '[]'$  depression  Musculoskeletal: '[]'$  arthritis '[]'$  joint pain  Integumentary: '[]'$  rashes '[]'$  ulcers  Constitutional: '[]'$  fever '[]'$  chills   Physical Examination  Vitals:   09/20/21 0811 09/20/21 0820  BP: (!) 146/69   Pulse: 71 70  Resp: 20 18  Temp: 98.2 F (36.8 C)   SpO2: 99% 100%   Body mass index is 31.29 kg/m.  General:  NAD Gait: Not observed HENT: WNL, normocephalic Pulmonary: normal non-labored breathing Cardiac: regular, without  Murmurs, rubs  or gallops Abdomen:  soft, NT/ND Vascular Exam/Pulses: 1+ palpable left femoral pulse Weak right femoral pulse that is thready No palpable pedal pulses Dependent rubor right foot with extensive tissue loss in the right toe as pictured below Extremities: With ischemic changes right foot Musculoskeletal: no muscle wasting or atrophy  Neurologic: A&O X 3; Appropriate Affect ; SENSATION: normal; MOTOR FUNCTION:  moving all extremities equally. Speech is fluent/normal     CBC    Component Value Date/Time   WBC 6.6 09/18/2021 0409   RBC 2.41 (L) 09/18/2021 0409   HGB 8.2 (L) 09/18/2021 0409   HCT 25.3 (L) 09/18/2021 0409   HCT 26.1 (L) 04/16/2020 1052   PLT 130 (L) 09/18/2021 0409   MCV 105.0 (H) 09/18/2021 0409   MCH 34.0 09/18/2021 0409   MCHC 32.4 09/18/2021 0409   RDW 14.7 09/18/2021 0409   LYMPHSABS 0.9 09/15/2021 1504   MONOABS 0.6 09/15/2021 1504   EOSABS 0.3 09/15/2021 1504   BASOSABS 0.0 09/15/2021 1504    BMET    Component Value Date/Time   NA 135 09/18/2021 0409   NA 140 08/23/2021 1208   K 4.0 09/18/2021 0409   CL 98 09/18/2021 0409   CO2 25 09/18/2021 0409   GLUCOSE 76 09/18/2021 0409   BUN 35 (H) 09/18/2021 0409   BUN 39 (H) 08/23/2021 1208    CREATININE 8.78 (H) 09/18/2021 0409   CALCIUM 8.4 (L) 09/18/2021 0409   GFRNONAA 6 (L) 09/18/2021 0409   GFRAA 6 (L) 04/20/2020 0545    COAGS: No results found for: INR, PROTIME   Non-Invasive Vascular Imaging:    ABIs  0.63 on the right and 0.44 on the left   ASSESSMENT/PLAN: This is a 70 y.o. male with multiple medical comorbidities who presents as a transfer from [facility] with cellulitis and tissue loss of the right foot as pictured above.  I discussed with him this is a limb threatening situation consistent with critical limb ischemia.  I have offered him aortogram with lower extremity angiogram in the Cath Lab tomorrow with my partner Dr. Unk Lightning.  Discussed transfemoral access of the left common femoral artery and evaluating lower extremity blood flow.  Discussed this being done under moderate sedation.  Discussed we will evaluate if he has any endovascular options.  May ultimately require open surgical intervention if no endovascular options.  Risk benefits discussed including risk of bleeding, etc.  Please keep n.p.o. after midnight.  We will schedule for tomorrow.     Marty Heck, MD [facility] [place] Office: [place]

## 2021-09-20 NOTE — Op Note (Addendum)
Patient name: Kerry Underwood MRN: [id number] DOB: April 01, 1951 Sex: male  09/20/2021 Pre-operative Diagnosis: Left lower extremity Rutherford 5 critical limb ischemia Post-operative diagnosis:  Same Surgeon:  Broadus John, MD Procedure Performed: 1.  Ultrasound-guided micropuncture access of the left common femoral artery 2.  Aortogram 3.  Bilateral lower extremity angiogram 4.  Third order cannulation, right lower extremity angiogram 5.  Moderate sedation, 86 minutes   Indications: Patient is a 70 year old male who presented to the hospital with rest pain and wounds on his right foot, with concern for infection.  ABIs demonstrated severe peripheral arterial disease, and therefore after discussing the risks and benefits of lower extremity angiography, an attempt to improve perfusion, Kerry Underwood elected to proceed  Findings:  Aorta: Bilateral renal arteries patent, atretic, left-sided accessory.-Patient with end-stage renal disease. On the right, moderate aortoiliac atherosclerotic disease.  There is no flow-limiting stenosis in the common iliac, external iliac artery.  The internal iliac arteries patent.  There is significant common femoral artery disease, stenosis at the ostium of the profunda, the SFA has severe atherosclerotic disease with multiple areas of total occlusion, and reconstitution from profunda collaterals.  Distally, the superficial femoral artery is patent with severe disease, which continues into the popliteal artery.  Outflow demonstrates patent peroneal and posterior tibial arteries however there are multiple, tandem, flow-limiting stenoses within the peroneal. The posterior tibial artery appears patent, however small in size. This is likely due to being under-filled.  Runoff to the foot is posterior tibial artery dominant with both medial and lateral perforators from the peroneal artery appreciated.  On the left: No flow-limiting stenosis appreciated through the common iliac  and external iliac arteries.  The internal iliac artery is widely patent.  There is severe atherosclerotic disease of the left common femoral artery which is flow-limiting.  The profunda is widely patent.  There is severe disease of the superficial femoral artery with focal mid SFA occlusion.  Distally, there is reconstitution with severe disease continued through the popliteal artery.  The tibial trifurcation appears patent. Runoff was poorly assessed due to contrast washout.   Procedure:  The patient was identified in the holding area and taken to room 8.  The patient was then placed supine on the table and prepped and draped in the usual sterile fashion.  A time out was called.  Ultrasound was used to evaluate the left common femoral artery.  It was patent .  A digital ultrasound image was acquired.  A micropuncture needle was used to access the left common femoral artery under ultrasound guidance.  An 018 wire was advanced without resistance and a micropuncture sheath was placed.  The 018 wire was removed and a benson wire was placed.  The micropuncture sheath was exchanged for a 5 french sheath.  An omniflush catheter was advanced over the wire to the level of L-1.  An abdominal anram was obtained.  Flush catheter was then down to the terminal aorta for bilateral lower extremity angiogram.  To assess right lower extremity runoff, a flush catheter was advanced into the common femoral artery.  Intervention: A 6 x 45 cm sheath was brought into the field and advanced into the right common femoral artery.  The SFA was cannulated and true lumen confirmed using angiography.  Several wires and catheters were used, however I was unable to traverse the significant disease in the superficial femoral artery due to tandem chronic total occlusions.  Plan: Patient will be vein mapped for possible open bypass  versus high risk endovascular intervention I will discuss this with the patient and discussed findings with Dr.  Carlis Abbott.  Cassandria Santee, MD [facility] [place] Office: [phone]

## 2021-09-20 NOTE — Progress Notes (Signed)
Patient attempting to get up to use his urinal. Reminded patient of bed rest until 2:45 pm.   Daymon Larsen, RN

## 2021-09-20 NOTE — Progress Notes (Addendum)
Nutrition Follow-up  DOCUMENTATION CODES:   Not applicable  INTERVENTION:   -D/c Nepro -30 ml Prosource Plus daily, each supplement provides 100 kcals and 15 grams protein -Double protein portions daily -Continue renal MVI daily   NUTRITION DIAGNOSIS:   Increased nutrient needs related to chronic illness, wound healing as evidenced by estimated needs.  Ongoing  GOAL:   Patient will meet greater than or equal to 90% of their needs  Progressing   MONITOR:   PO intake, Supplement acceptance, Labs, Weight trends  REASON FOR ASSESSMENT:   Consult Wound healing  ASSESSMENT:   70 yo male admitted with cellulitis of right foot. PMH includes ESRD/HD, CHF, COPD, HTN, strokes  10/12- s/p Procedure Performed: 1.  Ultrasound-guided micropuncture access of the left common femoral artery 2.  Aortogram 3.  Bilateral lower extremity angiogram 4.  Third order cannulation, right lower extremity angiogram 5.  Moderate sedation, 86 minutes  Reviewed I/O's: +2.8 L x 24 hours  Spoke with pt at bedside, who reports that he is looking forward to aortogram tomorrow and hopes that this will help with his wound healing.   Pt reports fair appetite. Noted meal completion 15-75%. Pt explains "I don't eat like most people". Per pt, he snacks on nabs during HD days (does not eat a feull meal, as he often feels sick after HD). On non-HD days, pt consumes 1 meal per day, which consists of meat. Pt explains that he has had to restrict his diet due to HD and has eliminated processed foods, dark colored sodas, and ice cream from his diet.   Pt pt, his UBW is around 186-190#. He is unsure of EDW. Pt reports his albumin has improved since starting HD, but often has issues with high phosphorus despite using binders.   Noted pt with thinning hair. He reports he started losing his hair since starting HD one year ago. Noted vitamin A level pending.   Discussed importance of good meal and supplement intake  to promote healing. Pt does not like the Nepro shakes and takes Prosource at HD.   Medications reviewed and include calcium carbonate, aranesp, and velphro.   Labs reviewed.   NUTRITION - FOCUSED PHYSICAL EXAM:  Flowsheet Row Most Recent Value  Orbital Region No depletion  Upper Arm Region No depletion  Thoracic and Lumbar Region No depletion  Buccal Region No depletion  Temple Region No depletion  Clavicle Bone Region No depletion  Clavicle and Acromion Bone Region No depletion  Scapular Bone Region No depletion  Dorsal Hand No depletion  Patellar Region No depletion  Anterior Thigh Region No depletion  Posterior Calf Region No depletion  Edema (RD Assessment) Mild  Hair Reviewed  Eyes Reviewed  Mouth Reviewed  Skin Reviewed  Nails Reviewed       Diet Order:   Diet Order             Diet renal/carb modified with fluid restriction Diet-HS Snack? Nothing; Fluid restriction: 1200 mL Fluid; Room service appropriate? Yes; Fluid consistency: Thin  Diet effective now                   EDUCATION NEEDS:   Education needs have been addressed  Skin:  Skin Assessment: Skin Integrity Issues: Skin Integrity Issues:: Other (Comment) Other: cellulitis of right foot  Last BM:  09/18/21  Height:   Ht Readings from Last 1 Encounters:  09/18/21 '5\' 5"'$  (1.651 m)    Weight:   Wt Readings from Last 1  Encounters:  09/18/21 85.3 kg   BMI:  Body mass index is 31.29 kg/m.  Estimated Nutritional Needs:   Kcal:  2000-2200 kcals  Protein:  100-120 g  Fluid:  1000 mL plus UOP    Loistine Chance, RD, LDN, [place] Registered Dietitian II Certified Diabetes Care and Education Specialist Please refer to [facility] for RD and/or RD on-call/weekend/after hours pager

## 2021-09-20 NOTE — Progress Notes (Signed)
PROGRESS NOTE        PATIENT DETAILS Name: Kerry Underwood Age: 70 y.o. Sex: male Date of Birth: 11/20/1951 Admit Date: 09/15/2021 Admitting Physician Orson Eva, MD CC:[id number], Shireen Quan, FNP  Brief Narrative: Patient is a 70 y.o. male with history of ESRD on HD, HTN, COPD who presented with right foot pain/swelling for several weeks-with blackish discoloration of his right second toe.  He was initially evaluated at [facility] transferred to [facility] for vascular surgery evaluation.  Subjective: Lying comfortably in bed-denies any chest pain or shortness of breath.  Objective: Vitals: Blood pressure 129/67, pulse 80, temperature 97.9 F (36.6 C), temperature source Oral, resp. rate 19, height '5\' 5"'$  (1.651 m), weight 85.3 kg, SpO2 97 %.   Exam: Gen Exam:Alert awake-not in any distress HEENT:atraumatic, normocephalic Chest: B/L clear to auscultation anteriorly CVS:S1S2 regular Abdomen:soft non tender, non distended Extremities: Right dorsum foot-erythematous-swollen.  Gangrene is right second toe. Neurology: Non focal Skin: no rash  Pertinent Labs/Radiology: WBC: 6.6 Hb: 8.2 Na: 135 K: 4.0 Creatinine: 8.78  10/7>>Blood culture: No growth 10/7>> x-ray right foot: Soft tissue swelling of the foot-no osseous abnormality. Assessment/Plan: Left foot soft tissue infection with critical limb ischemia: Continue IV vancomycin-vascular surgery following-we will await recommendations.  Arteriogram on 10/12 showed severe atherosclerotic disease involving the left common femoral artery, superficial femoral artery, and mid SFA occlusion.  PAD: Vascular surgery following.  On aspirin/Plavix-add statin (LDL 90 on 9/14)  ESRD: On HD TTS-nephrology following-defer EGD to nephrology.  Macrocytic anemia: No evidence of blood loss-on Aranesp/IV iron per nephrology-check anemia panel with a.m. labs.  HTN: BP stable-continue metoprolol  COPD: Not in exacerbation-continue  bronchodilators  BPH: Continue Flomax  Tobacco abuse: Counseled  Obesity: Estimated body mass index is 31.29 kg/m as calculated from the following:   Height as of this encounter: '5\' 5"'$  (1.651 m).   Weight as of this encounter: 85.3 kg.    Procedures:  1.  Ultrasound-guided micropuncture access of the left common femoral artery 2.  Aortogram 3.  Bilateral lower extremity angiogram 4.  Third order cannulation, right lower extremity angiogram  Consults: Vascular surgery, nephrology DVT Prophylaxis: Heparin Code Status:Full code  Family Communication: None at bedside  Time spent: 35 minutes-Greater than 50% of this time was spent in counseling, explanation of diagnosis, planning of further management, and coordination of care.  Diet: Diet Order             Diet renal/carb modified with fluid restriction Diet-HS Snack? Nothing; Fluid restriction: 1200 mL Fluid; Room service appropriate? Yes; Fluid consistency: Thin  Diet effective now                      Disposition Plan: Status is: Inpatient  Remains inpatient appropriate because:Inpatient level of care appropriate due to severity of illness  Dispo: The patient is from: Home              Anticipated d/c is to: Home              Patient currently is not medically stable to d/c.   Difficult to place patient No     Barriers to Discharge: Left lower extremity cellulitis-critical limb ischemia-on IV antibiotics-May need revascularization prior to discharge  Antimicrobial agents: Anti-infectives (From admission, onward)    Start     Dose/Rate Route Frequency Ordered Stop  09/19/21 1200  vancomycin (VANCOCIN) IVPB 1000 mg/200 mL premix        1,000 mg 200 mL/hr over 60 Minutes Intravenous Every T-Th-Sa (Hemodialysis) 09/18/21 1237     09/17/21 1200  vancomycin (VANCOREADY) IVPB 1500 mg/300 mL        1,500 mg 150 mL/hr over 120 Minutes Intravenous  Once 09/17/21 1106 09/17/21 1422   09/16/21 1000  cefTRIAXone  (ROCEPHIN) 2 g in sodium chloride 0.9 % 100 mL IVPB  Status:  Discontinued       See Hyperspace for full Linked Orders Report.   2 g 200 mL/hr over 30 Minutes Intravenous Every 24 hours 09/15/21 1916 09/18/21 0856   09/15/21 1916  metroNIDAZOLE (FLAGYL) IVPB 500 mg  Status:  Discontinued       See Hyperspace for full Linked Orders Report.   500 mg 100 mL/hr over 60 Minutes Intravenous Every 8 hours 09/15/21 1916 09/18/21 0856   09/15/21 1800  vancomycin (VANCOCIN) IVPB 1000 mg/200 mL premix        1,000 mg 200 mL/hr over 60 Minutes Intravenous  Once 09/15/21 1751 09/15/21 1917   09/15/21 1800  ceFEPIme (MAXIPIME) 2 g in sodium chloride 0.9 % 100 mL IVPB        2 g 200 mL/hr over 30 Minutes Intravenous  Once 09/15/21 1751 09/15/21 1817        MEDICATIONS: Scheduled Meds:  aspirin EC  325 mg Oral QHS   calcium carbonate  500 mg Oral QHS   Chlorhexidine Gluconate Cloth  6 each Topical Daily   Chlorhexidine Gluconate Cloth  6 each Topical Q0600   Chlorhexidine Gluconate Cloth  6 each Topical Q0600   clopidogrel  75 mg Oral Q breakfast   darbepoetin (ARANESP) injection - DIALYSIS  150 mcg Intravenous Q Tue-HD   feeding supplement (NEPRO CARB STEADY)  237 mL Oral BID BM   fluticasone furoate-vilanterol  1 puff Inhalation Daily   heparin  5,000 Units Subcutaneous Q8H   metoprolol tartrate  25 mg Oral Q1400   multivitamin  1 tablet Oral QHS   sodium chloride flush  3 mL Intravenous Q12H   sucroferric oxyhydroxide  1,000 mg Oral TID WC   tamsulosin  0.4 mg Oral QHS   umeclidinium bromide  1 puff Inhalation Daily   Continuous Infusions:  sodium chloride     sodium chloride     sodium chloride     sodium chloride     vancomycin     PRN Meds:.sodium chloride, sodium chloride, sodium chloride, acetaminophen, alteplase, calcitRIOL, heparin, hydrALAZINE, HYDROmorphone (DILAUDID) injection, labetalol, ondansetron (ZOFRAN) IV, ondansetron **OR** [DISCONTINUED] ondansetron (ZOFRAN) IV,  oxyCODONE, sodium chloride flush   I have personally reviewed following labs and imaging studies  LABORATORY DATA: CBC: Recent Labs  Lab 09/15/21 1504 09/16/21 0642 09/17/21 0644 09/18/21 0409  WBC 7.8 7.0 8.6 6.6  NEUTROABS 6.0  --   --   --   HGB 10.6* 9.0* 9.7* 8.2*  HCT 32.3* 27.2* 29.3* 25.3*  MCV 106.6* 104.2* 103.9* 105.0*  PLT 160 128* 148* 130*    Basic Metabolic Panel: Recent Labs  Lab 09/15/21 1504 09/16/21 0642 09/17/21 0644 09/18/21 0409  NA 136 136 134* 135  K 4.7 4.6 4.3 4.0  CL 96* 100 97* 98  CO2 '24 22 27 25  '$ GLUCOSE 84 91 97 76  BUN 61* 66* 25* 35*  CREATININE 13.07* 13.84* 6.79* 8.78*  CALCIUM 8.5* 8.2* 8.6* 8.4*    GFR: Estimated Creatinine Clearance:  7.6 mL/min (A) (by C-G formula based on SCr of 8.78 mg/dL (H)).  Liver Function Tests: Recent Labs  Lab 09/17/21 0644 09/18/21 0409  AST 41 37  ALT 21 18  ALKPHOS 65 52  BILITOT 0.9 0.8  PROT 7.2 6.1*  ALBUMIN 3.5 2.9*   No results for input(s): LIPASE, AMYLASE in the last 168 hours. No results for input(s): AMMONIA in the last 168 hours.  Coagulation Profile: No results for input(s): INR, PROTIME in the last 168 hours.  Cardiac Enzymes: No results for input(s): CKTOTAL, CKMB, CKMBINDEX, TROPONINI in the last 168 hours.  BNP (last 3 results) No results for input(s): PROBNP in the last 8760 hours.  Lipid Profile: No results for input(s): CHOL, HDL, LDLCALC, TRIG, CHOLHDL, LDLDIRECT in the last 72 hours.  Thyroid Function Tests: No results for input(s): TSH, T4TOTAL, FREET4, T3FREE, THYROIDAB in the last 72 hours.  Anemia Panel: No results for input(s): VITAMINB12, FOLATE, FERRITIN, TIBC, IRON, RETICCTPCT in the last 72 hours.  Urine analysis:    Component Value Date/Time   COLORURINE YELLOW 04/15/2020 2200   APPEARANCEUR TURBID (A) 04/15/2020 2200   LABSPEC 1.010 04/15/2020 2200   PHURINE 6.0 04/15/2020 2200   GLUCOSEU NEGATIVE 04/15/2020 2200   HGBUR SMALL (A)  04/15/2020 2200   BILIRUBINUR NEGATIVE 04/15/2020 2200   KETONESUR NEGATIVE 04/15/2020 2200   PROTEINUR 100 (A) 04/15/2020 2200   NITRITE POSITIVE (A) 04/15/2020 2200   LEUKOCYTESUR LARGE (A) 04/15/2020 2200    Sepsis Labs: Lactic Acid, Venous    Component Value Date/Time   LATICACIDVEN 1.2 09/15/2021 1802    MICROBIOLOGY: Recent Results (from the past 240 hour(s))  Culture, blood (routine x 2)     Status: None   Collection Time: 09/15/21  2:56 PM   Specimen: Left Antecubital; Blood  Result Value Ref Range Status   Specimen Description LEFT ANTECUBITAL  Final   Special Requests   Final    Blood Culture adequate volume BOTTLES DRAWN AEROBIC AND ANAEROBIC   Culture   Final    NO GROWTH 5 DAYS Performed at [facility], [address], [place], [place] [address]    Report Status 09/20/2021 FINAL  Final  Culture, blood (routine x 2)     Status: None   Collection Time: 09/15/21  3:04 PM   Specimen: BLOOD LEFT HAND  Result Value Ref Range Status   Specimen Description BLOOD LEFT HAND  Final   Special Requests   Final    Blood Culture results may not be optimal due to an excessive volume of blood received in culture bottles BOTTLES DRAWN AEROBIC AND ANAEROBIC   Culture   Final    NO GROWTH 5 DAYS Performed at [facility], [address] [place], [place] [address]    Report Status 09/20/2021 FINAL  Final  Resp Panel by RT-PCR (Flu A&B, Covid) Nasopharyngeal Swab     Status: None   Collection Time: 09/15/21  5:54 PM   Specimen: Nasopharyngeal Swab; Nasopharyngeal(NP) swabs in vial transport medium  Result Value Ref Range Status   SARS Coronavirus 2 by RT PCR NEGATIVE NEGATIVE Final    Comment: (NOTE) SARS-CoV-2 target nucleic acids are NOT DETECTED.  The SARS-CoV-2 RNA is generally detectable in upper respiratory specimens during the acute phase of infection. The lowest concentration of SARS-CoV-2 viral copies this assay can detect is 138 copies/mL. A negative result does  not preclude SARS-Cov-2 infection and should not be used as the sole basis for treatment or other patient  management decisions. A negative result may occur with  improper specimen collection/handling, submission of specimen other than nasopharyngeal swab, presence of viral mutation(s) within the areas targeted by this assay, and inadequate number of viral copies(<138 copies/mL). A negative result must be combined with clinical observations, patient history, and epidemiological information. The expected result is Negative.  Fact Sheet for Patients:  EntrepreneurPulse.com.au  Fact Sheet for Healthcare Providers:  IncredibleEmployment.be  This test is no t yet approved or cleared by the [place] FDA and  has been authorized for detection and/or diagnosis of SARS-CoV-2 by FDA under an Emergency Use Authorization (EUA). This EUA will remain  in effect (meaning this test can be used) for the duration of the COVID-19 declaration under Section 564(b)(1) of the Act, 21 U.S.C.section 360bbb-3(b)(1), unless the authorization is terminated  or revoked sooner.       Influenza A by PCR NEGATIVE NEGATIVE Final   Influenza B by PCR NEGATIVE NEGATIVE Final    Comment: (NOTE) The Xpert Xpress SARS-CoV-2/FLU/RSV plus assay is intended as an aid in the diagnosis of influenza from Nasopharyngeal swab specimens and should not be used as a sole basis for treatment. Nasal washings and aspirates are unacceptable for Xpert Xpress SARS-CoV-2/FLU/RSV testing.  Fact Sheet for Patients: EntrepreneurPulse.com.au  Fact Sheet for Healthcare Providers: IncredibleEmployment.be  This test is not yet approved or cleared by the [place] FDA and has been authorized for detection and/or diagnosis of SARS-CoV-2 by FDA under an Emergency Use Authorization (EUA). This EUA will remain in effect (meaning this test can be used) for the  duration of the COVID-19 declaration under Section 564(b)(1) of the Act, 21 U.S.C. section 360bbb-3(b)(1), unless the authorization is terminated or revoked.  Performed at [facility], [address] [place], [place] [address]     RADIOLOGY STUDIES/RESULTS: MR FOOT RIGHT WO CONTRAST  Result Date: 09/18/2021 CLINICAL DATA:  Osteomyelitis, foot, right toes open wound for 2 months EXAM: MRI OF THE RIGHT FOREFOOT WITHOUT CONTRAST TECHNIQUE: Multiplanar, multisequence MR imaging of the right forefoot was performed. No intravenous contrast was administered. COMPARISON:  Right foot radiograph 09/15/2021 FINDINGS: Extensive motion artifact which significantly degrades image quality. Bones/Joint/Cartilage There is no frank bony destruction. There is mild edema signal within the middle and distal phalanges of all the toes on coronal T2 images, favored to be artifactual. There is no visible confluent low T1 signal, though there is significant motion artifact on the sequences. 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 Ligaments Intact Lisfranc ligament.  Intact collateral ligaments. Muscles and Tendons No significant muscle atrophy. Mild intramuscular edema, as is commonly seen in diabetics. Soft tissues Diffuse soft tissue swelling of the foot. No focal fluid collection is visible. IMPRESSION: Extensive motion artifact which significantly degrades image quality. Mild edema signal within the middle and distal phalanges of all the toes on coronal T2 which is favored to be artifactual. No definite evidence of osteomyelitis. If there is persistent clinical concern, follow-up radiographs and/or MRI could be obtained. Electronically Signed   By: Maurine Simmering M.D.   On: 09/18/2021 15:51   PERIPHERAL VASCULAR CATHETERIZATION  Result Date: 09/20/2021 Images from the original result were not included.   Patient name: Kerry Underwood    MRN: [id number]        DOB: 1951-03-10          Sex: male  09/20/2021 Pre-operative  Diagnosis: Left lower extremity Rutherford 5 critical limb ischemia Post-operative diagnosis:  Same Surgeon:  Broadus John, MD Procedure Performed:  1.  Ultrasound-guided micropuncture access of the left common femoral artery 2.  Aortogram 3.  Bilateral lower extremity angiogram 4.  Third order cannulation, right lower extremity angiogram 5.  Moderate sedation, 86 minutes   Indications: Patient is a 70 year old male who presented to the hospital with rest pain and wounds on his right foot, with concern for infection.  ABIs demonstrated severe peripheral arterial disease, and therefore after discussing the risks and benefits of lower extremity angiography, an attempt to improve perfusion, Hyrum elected to proceed  Findings: Aorta: Bilateral renal arteries patent, atretic, left-sided accessory.-Patient with end-stage renal disease. On the right, moderate aortoiliac atherosclerotic disease.  There is no flow-limiting stenosis in the common iliac, external iliac artery.  The internal iliac arteries patent.  There is significant common femoral artery disease, stenosis at the ostium of the profunda, the SFA has severe atherosclerotic disease with multiple areas of total occlusion, and reconstitution from profunda collaterals.  Distally, the superficial femoral artery is patent with severe disease, which continues into the popliteal artery.  Outflow demonstrates patent peroneal and posterior tibial arteries however there are multiple, tandem, flow-limiting stenoses within the peroneal. The posterior tibial artery appears patent, however small in size. This is likely due to being under-filled.  Runoff to the foot is posterior tibial artery dominant with both medial and lateral perforators from the peroneal artery appreciated.  On the left: No flow-limiting stenosis appreciated through the common iliac and external iliac arteries.  The internal iliac artery is widely patent.  There is severe atherosclerotic disease of the  left common femoral artery which is flow-limiting.  The profunda is widely patent.  There is severe disease of the superficial femoral artery with focal mid SFA occlusion.  Distally, there is reconstitution with severe disease continued through the popliteal artery.  The tibial trifurcation appears patent. Runoff was poorly assessed due to contrast washout.  Procedure:  The patient was identified in the holding area and taken to room 8.  The patient was then placed supine on the table and prepped and draped in the usual sterile fashion.  A time out was called.  Ultrasound was used to evaluate the left common femoral artery.  It was patent .  A digital ultrasound image was acquired.  A micropuncture needle was used to access the left common femoral artery under ultrasound guidance.  An 018 wire was advanced without resistance and a micropuncture sheath was placed.  The 018 wire was removed and a benson wire was placed.  The micropuncture sheath was exchanged for a 5 french sheath.  An omniflush catheter was advanced over the wire to the level of L-1.  An abdominal anram was obtained.  Flush catheter was then down to the terminal aorta for bilateral lower extremity angiogram.  To assess right lower extremity runoff, a flush catheter was advanced into the common femoral artery.  Intervention: A 6 x 45 cm sheath was brought into the field and advanced into the right common femoral artery.  The SFA was cannulated and true lumen confirmed using angiography.  Several wires and catheters were used, however I was unable to traverse the significant disease in the superficial femoral artery due to tandem chronic total occlusions.  Plan: Patient will be vein mapped for possible open bypass versus high risk endovascular intervention I will discuss this with the patient and discussed findings with Dr. Carlis Abbott.  Cassandria Santee, MD [facility] Office: [phone]       LOS:  5 days   Oren Binet, MD  [facility]    To contact the attending provider between 7A-7P or the covering provider during after hours 7P-7A, please log into the web site [email or web address] and access using universal [place] password for that web site. If you do not have the password, please call the hospital operator.  09/20/2021, 12:31 PM

## 2021-09-20 NOTE — Progress Notes (Signed)
Mobility Specialist Progress Note:   09/20/21 1435  Therapy Vitals  Pulse Rate 71  BP 116/62  Mobility  Activity Ambulated in hall  Level of Assistance Standby assist, set-up cues, supervision of patient - no hands on  Assistive Device Other (Comment) (IV Pole)  Distance Ambulated (ft) 170 ft  Mobility Ambulated independently in hallway  Mobility Response Tolerated well  Mobility performed by Mobility specialist  $Mobility charge 1 Mobility   Pre Mobility: HR 71 bpm; BP 116/62 During Mobility: HR 74 bpm Post Mobility: HR 65 bpm; BP 134/62  Pt received in bed, agreed to mobility after encouragement. Ambulated in hallway 170' with IV pole and supervision. Stated R foot was "sore to the touch." Pt was pleasantly surprised he was able to walk on that foot. Distance limited d/t pain in foot. Pt left sitting EOB with all needs met.   Nelta Numbers Mobility Specialist  Phone [phone]

## 2021-09-20 NOTE — Progress Notes (Signed)
Pt receives out-pt HD at [facility]) on TTS. Pt needs to arrive by 5:50 for 6:05 chair time. Pt uses transportation through [facility]. Will follow and assist as needed.  Melven Sartorius Renal Navigator [phone]

## 2021-09-20 NOTE — Progress Notes (Signed)
Patient brought to 4E from cath lab. VSS. Telemetry applied, CCMD notified. Patient educated on bedrest until 2:45. Patient oriented to room and staff. Call bell in reach.  Daymon Larsen, RN

## 2021-09-21 ENCOUNTER — Inpatient Hospital Stay (HOSPITAL_COMMUNITY): Payer: 59

## 2021-09-21 DIAGNOSIS — I739 Peripheral vascular disease, unspecified: Secondary | ICD-10-CM

## 2021-09-21 DIAGNOSIS — L03115 Cellulitis of right lower limb: Secondary | ICD-10-CM | POA: Diagnosis not present

## 2021-09-21 DIAGNOSIS — I151 Hypertension secondary to other renal disorders: Secondary | ICD-10-CM | POA: Diagnosis not present

## 2021-09-21 DIAGNOSIS — N186 End stage renal disease: Secondary | ICD-10-CM | POA: Diagnosis not present

## 2021-09-21 LAB — RETICULOCYTES
Immature Retic Fract: 23 % — ABNORMAL HIGH (ref 2.3–15.9)
RBC.: 2.39 MIL/uL — ABNORMAL LOW (ref 4.22–5.81)
Retic Count, Absolute: 68.8 10*3/uL (ref 19.0–186.0)
Retic Ct Pct: 2.9 % (ref 0.4–3.1)

## 2021-09-21 LAB — RENAL FUNCTION PANEL
Albumin: 2.7 g/dL — ABNORMAL LOW (ref 3.5–5.0)
Anion gap: 14 (ref 5–15)
BUN: 58 mg/dL — ABNORMAL HIGH (ref 8–23)
CO2: 22 mmol/L (ref 22–32)
Calcium: 8.3 mg/dL — ABNORMAL LOW (ref 8.9–10.3)
Chloride: 98 mmol/L (ref 98–111)
Creatinine, Ser: 13.42 mg/dL — ABNORMAL HIGH (ref 0.61–1.24)
GFR, Estimated: 4 mL/min — ABNORMAL LOW (ref >60)
Glucose, Bld: 103 mg/dL — ABNORMAL HIGH (ref 70–99)
Phosphorus: 7.3 mg/dL — ABNORMAL HIGH (ref 2.5–4.6)
Potassium: 4.9 mmol/L (ref 3.5–5.1)
Sodium: 134 mmol/L — ABNORMAL LOW (ref 135–145)

## 2021-09-21 LAB — FERRITIN: Ferritin: 1104 ng/mL — ABNORMAL HIGH (ref 24–336)

## 2021-09-21 LAB — IRON AND TIBC
Iron: 55 ug/dL (ref 45–182)
Saturation Ratios: 28 % (ref 17.9–39.5)
TIBC: 197 ug/dL — ABNORMAL LOW (ref 250–450)
UIBC: 142 ug/dL

## 2021-09-21 LAB — FOLATE: Folate: 45.7 ng/mL (ref >5.9)

## 2021-09-21 LAB — VITAMIN B12: Vitamin B-12: 547 pg/mL (ref 180–914)

## 2021-09-21 MED ORDER — CALCITRIOL 0.25 MCG PO CAPS
0.2500 ug | ORAL_CAPSULE | ORAL | Status: DC
  Administered 2021-09-21 – 2021-09-23 (×2): 0.25 ug via ORAL
  Filled 2021-09-21 (×2): qty 1

## 2021-09-21 MED ORDER — VANCOMYCIN VARIABLE DOSE PER UNSTABLE RENAL FUNCTION (PHARMACIST DOSING): Status: DC

## 2021-09-21 NOTE — Progress Notes (Signed)
Lower extremity vein mapping study completed.   Please see CV Proc for preliminary results.   Sarinah Doetsch, RDMS, RVT  

## 2021-09-21 NOTE — Progress Notes (Signed)
[facility]  Subjective  -states his right foot hurts.  Seen in dialysis.   Objective 135/66 66 98.4 F (36.9 C) (Oral) 10 95%  Intake/Output Summary (Last 24 hours) at 09/21/2021 0810 Last data filed at 09/20/2021 2115 Gross per 24 hour  Intake 240 ml  Output 250 ml  Net -10 ml    Left groin clean dry and intact after transfemoral access for right leg intervention Right foot with significant dependent rubor and gangrene of the right second toe  Laboratory Lab Results: Recent Labs    09/20/21 1226  WBC 6.6  HGB 8.7*  HCT 26.5*  PLT 164   BMET Recent Labs    09/20/21 1226 09/21/21 0229  NA  --  134*  K  --  4.9  CL  --  98  CO2  --  22  GLUCOSE  --  103*  BUN  --  58*  CREATININE 12.90* 13.42*  CALCIUM  --  8.3*    COAG No results found for: INR, PROTIME No results found for: PTT  Assessment/Planning:  70 year old male with critical limb ischemia of the right lower extremity with tissue loss.  Underwent arteriogram yesterday with Dr. Unk Lightning and unfortunately has no endovascular options.  He is now scheduled tomorrow with me for right femoral endarterectomy and a fem distal bypass.  Vein mapping today.  Please keep n.p.o. after midnight.  I discussed my concerns that he may ultimately still face amputation even with revascularization given fairly poor outflow into the foot.  I discussed the option of a second toe amputation tomorrow and he wants to wait on this for now.  Marty Heck 09/21/2021 8:10 AM --

## 2021-09-21 NOTE — Progress Notes (Signed)
Pharmacy Antibiotic Note  Kerry Underwood is a 70 y.o. male admitted on 09/15/2021 with wound infection not improving.  Pharmacy has been consulted for Vancomycin dosing.  Today is abx D#7, noted plans for intervention with VVS tomorrow. If cellulitis appears to be resolved/resolving, consider stopping antibiotics soon.   The patient received an extra dose of Vancomycin on 10/12 without HD - estimated levels ~40 pre-HD today. Will plan to hold Vancomycin after HD and f/u on scheduling additional doses as HD schedule determined.  - Doses: 1g 10/7 @ 1800, 1500 mg on 10/9 @ 1230, 1g on 10/12 @ 1500 - HD schedule: 10/8 (BFR 400, 3.5 hr), receiving 10/13 AM - Est level pre HD 10/13 ~40, post HD~22 mcg/ml   Plan: - Hold Vancomycin after HD today - estimated levels will remain therapeutic - Will continue to follow HD schedule/duration, culture results, LOT, and antibiotic de-escalation plans   Height: '5\' 5"'$  (165.1 cm) Weight: 85.6 kg (188 lb 11.4 oz) IBW/kg (Calculated) : 61.5  Temp (24hrs), Avg:98 F (36.7 C), Min:97.9 F (36.6 C), Max:98.4 F (36.9 C)  Recent Labs  Lab 09/15/21 1504 09/15/21 1802 09/16/21 0642 09/17/21 0644 09/18/21 0409 09/20/21 1226 09/21/21 0229  WBC 7.8  --  7.0 8.6 6.6 6.6  --   CREATININE 13.07*  --  13.84* 6.79* 8.78* 12.90* 13.42*  LATICACIDVEN  --  1.2  --   --   --   --   --      Estimated Creatinine Clearance: 5 mL/min (A) (by C-G formula based on SCr of 13.42 mg/dL (H)).    Allergies  Allergen Reactions   Strawberry (Diagnostic)    Strawberry Extract     Other reaction(s): Unknown    10/9 Vancomycin >> 10/8 Rocephin >> 10/9 10/7 Flagyl >>10/9  10/7 Flu/COVID >> neg 10/7 BCx >> ngF  Thank you for allowing pharmacy to be a part of this patient's care.  Alycia Rossetti, PharmD, BCPS Clinical Pharmacist Clinical phone for 09/21/2021: [phone] 09/21/2021 9:40 AM   **Pharmacist phone directory can now be found on [email or web address] (PW [email or web address]).  Listed  under [place].

## 2021-09-21 NOTE — Progress Notes (Signed)
Pt arrived back from dialysis, VSS.   Chrisandra Carota, RN 09/21/2021 11:05 AM

## 2021-09-21 NOTE — Progress Notes (Signed)
PROGRESS NOTE        PATIENT DETAILS Name: Kerry Underwood Age: 70 y.o. Sex: male Date of Birth: 11/23/1951 Admit Date: 09/15/2021 Admitting Physician Orson Eva, MD [id number], Shireen Quan, FNP  Brief Narrative: Patient is a 70 y.o. male with history of ESRD on HD, HTN, COPD who presented with right foot pain/swelling for several weeks-with blackish discoloration of his right second toe.  He was initially evaluated at [facility] transferred to [facility] for vascular surgery evaluation.  Subjective: Seen earlier this morning while at hemodialysis.  Foot appears unchanged.  No other complaints.  Objective: Vitals: Blood pressure (!) 151/63, pulse 75, temperature 97.7 F (36.5 C), temperature source Oral, resp. rate 13, height '5\' 5"'$  (1.651 m), weight 83.1 kg, SpO2 100 %.   Exam: Gen Exam:Alert awake-not in any distress HEENT:atraumatic, normocephalic Chest: B/L clear to auscultation anteriorly CVS:S1S2 regular Abdomen:soft non tender, non distended Extremities:no edema-left dorsum foot-still swollen/erythematous-right second toe tip still gangrenous. Neurology: Non focal Skin: no rash   Pertinent Labs/Radiology: WBC: 6.6 Hb: 8.2 Na: 135 K: 4.0 Creatinine: 8.78  10/7>>Blood culture: No growth 10/7>> x-ray right foot: Soft tissue swelling of the foot-no osseous abnormality.  Assessment/Plan: Left foot soft tissue infection with critical limb ischemia: Continue IV vancomycin-arteriogram on 10/12 showed severe atherosclerotic disease involving left common femoral artery, superficial femoral artery.  Per vascular surgery-no endovascular options-schedule for right femoral endarterectomy/femoropopliteal bypass tomorrow.  Per vascular surgery-patient still may require second toe amputation post revascularization-he however wants to wait on second toe amputation.  PAD: Vascular surgery following.  On aspirin/Plavix-and statin (LDL 90 on 9/14)  ESRD: On HD  TTS-nephrology following-defer HD to nephrology.  Macrocytic anemia: No evidence of blood loss-on Aranesp/IV iron per nephrology-folate/vitamin B12 levels stable.    HTN: BP stable-continue metoprolol  COPD: Not in exacerbation-continue bronchodilators  BPH: Continue Flomax  Tobacco abuse: Counseled  Obesity: Estimated body mass index is 30.49 kg/m as calculated from the following:   Height as of this encounter: '5\' 5"'$  (1.651 m).   Weight as of this encounter: 83.1 kg.    Procedures:  10/12>> lower extremity arteriogram  Consults: Vascular surgery, nephrology DVT Prophylaxis: Heparin Code Status:Full code  Family Communication: None at bedside  Time spent: 25 minutes-Greater than 50% of this time was spent in counseling, explanation of diagnosis, planning of further management, and coordination of care.  Diet: Diet Order             Diet renal/carb modified with fluid restriction Diet-HS Snack? Nothing; Fluid restriction: 1200 mL Fluid; Room service appropriate? Yes; Fluid consistency: Thin  Diet effective now                      Disposition Plan: Status is: Inpatient  Remains inpatient appropriate because:Inpatient level of care appropriate due to severity of illness  Dispo: The patient is from: Home              Anticipated d/c is to: Home              Patient currently is not medically stable to d/c.   Difficult to place patient No     Barriers to Discharge: Left lower extremity cellulitis-critical limb ischemia-on IV antibiotics-May need revascularization prior to discharge  Antimicrobial agents: Anti-infectives (From admission, onward)    Start     Dose/Rate Route Frequency  Ordered Stop   09/21/21 0925  vancomycin variable dose per unstable renal function (pharmacist dosing)         Does not apply See admin instructions 09/21/21 0925     09/20/21 1345  vancomycin (VANCOCIN) IVPB 1000 mg/200 mL premix        1,000 mg 200 mL/hr over 60 Minutes  Intravenous  Once 09/20/21 1248 09/20/21 1610   09/19/21 1200  vancomycin (VANCOCIN) IVPB 1000 mg/200 mL premix  Status:  Discontinued        1,000 mg 200 mL/hr over 60 Minutes Intravenous Every T-Th-Sa (Hemodialysis) 09/18/21 1237 09/20/21 1247   09/17/21 1200  vancomycin (VANCOREADY) IVPB 1500 mg/300 mL        1,500 mg 150 mL/hr over 120 Minutes Intravenous  Once 09/17/21 1106 09/17/21 1422   09/16/21 1000  cefTRIAXone (ROCEPHIN) 2 g in sodium chloride 0.9 % 100 mL IVPB  Status:  Discontinued       See Hyperspace for full Linked Orders Report.   2 g 200 mL/hr over 30 Minutes Intravenous Every 24 hours 09/15/21 1916 09/18/21 0856   09/15/21 1916  metroNIDAZOLE (FLAGYL) IVPB 500 mg  Status:  Discontinued       See Hyperspace for full Linked Orders Report.   500 mg 100 mL/hr over 60 Minutes Intravenous Every 8 hours 09/15/21 1916 09/18/21 0856   09/15/21 1800  vancomycin (VANCOCIN) IVPB 1000 mg/200 mL premix        1,000 mg 200 mL/hr over 60 Minutes Intravenous  Once 09/15/21 1751 09/15/21 1917   09/15/21 1800  ceFEPIme (MAXIPIME) 2 g in sodium chloride 0.9 % 100 mL IVPB        2 g 200 mL/hr over 30 Minutes Intravenous  Once 09/15/21 1751 09/15/21 1817        MEDICATIONS: Scheduled Meds:  (feeding supplement) PROSource Plus  30 mL Oral BID BM   aspirin EC  325 mg Oral QHS   atorvastatin  40 mg Oral Daily   calcitRIOL  0.25 mcg Oral Q T,Th,Sa-HD   calcium carbonate  500 mg Oral QHS   Chlorhexidine Gluconate Cloth  6 each Topical Q0600   clopidogrel  75 mg Oral Q breakfast   darbepoetin (ARANESP) injection - DIALYSIS  150 mcg Intravenous Q Thu-HD   fluticasone furoate-vilanterol  1 puff Inhalation Daily   heparin  5,000 Units Subcutaneous Q8H   metoprolol tartrate  25 mg Oral Q1400   multivitamin  1 tablet Oral QHS   sodium chloride flush  3 mL Intravenous Q12H   sucroferric oxyhydroxide  1,000 mg Oral TID WC   tamsulosin  0.4 mg Oral QHS   umeclidinium bromide  1 puff  Inhalation Daily   vancomycin variable dose per unstable renal function (pharmacist dosing)   Does not apply See admin instructions   Continuous Infusions:  sodium chloride     sodium chloride     sodium chloride     PRN Meds:.sodium chloride, sodium chloride, sodium chloride, acetaminophen, alteplase, heparin, hydrALAZINE, HYDROmorphone (DILAUDID) injection, labetalol, ondansetron (ZOFRAN) IV, ondansetron **OR** [DISCONTINUED] ondansetron (ZOFRAN) IV, oxyCODONE, sodium chloride flush   I have personally reviewed following labs and imaging studies  LABORATORY DATA: CBC: Recent Labs  Lab 09/15/21 1504 09/16/21 0642 09/17/21 0644 09/18/21 0409 09/20/21 1226  WBC 7.8 7.0 8.6 6.6 6.6  NEUTROABS 6.0  --   --   --   --   HGB 10.6* 9.0* 9.7* 8.2* 8.7*  HCT 32.3* 27.2* 29.3* 25.3* 26.5*  MCV 106.6* 104.2* 103.9* 105.0* 101.9*  PLT 160 128* 148* 130* 164     Basic Metabolic Panel: Recent Labs  Lab 09/15/21 1504 09/16/21 0642 09/17/21 0644 09/18/21 0409 09/20/21 1226 09/21/21 0229  NA 136 136 134* 135  --  134*  K 4.7 4.6 4.3 4.0  --  4.9  CL 96* 100 97* 98  --  98  CO2 '24 22 27 25  '$ --  22  GLUCOSE 84 91 97 76  --  103*  BUN 61* 66* 25* 35*  --  58*  CREATININE 13.07* 13.84* 6.79* 8.78* 12.90* 13.42*  CALCIUM 8.5* 8.2* 8.6* 8.4*  --  8.3*  PHOS  --   --   --   --   --  7.3*     GFR: Estimated Creatinine Clearance: 4.9 mL/min (A) (by C-G formula based on SCr of 13.42 mg/dL (H)).  Liver Function Tests: Recent Labs  Lab 09/17/21 0644 09/18/21 0409 09/21/21 0229  AST 41 37  --   ALT 21 18  --   ALKPHOS 65 52  --   BILITOT 0.9 0.8  --   PROT 7.2 6.1*  --   ALBUMIN 3.5 2.9* 2.7*    No results for input(s): LIPASE, AMYLASE in the last 168 hours. No results for input(s): AMMONIA in the last 168 hours.  Coagulation Profile: No results for input(s): INR, PROTIME in the last 168 hours.  Cardiac Enzymes: No results for input(s): CKTOTAL, CKMB, CKMBINDEX,  TROPONINI in the last 168 hours.  BNP (last 3 results) No results for input(s): PROBNP in the last 8760 hours.  Lipid Profile: No results for input(s): CHOL, HDL, LDLCALC, TRIG, CHOLHDL, LDLDIRECT in the last 72 hours.  Thyroid Function Tests: No results for input(s): TSH, T4TOTAL, FREET4, T3FREE, THYROIDAB in the last 72 hours.  Anemia Panel: Recent Labs    09/21/21 0229  VITAMINB12 547  FOLATE 45.7  FERRITIN 1,104*  TIBC 197*  IRON 55  RETICCTPCT 2.9    Urine analysis:    Component Value Date/Time   COLORURINE YELLOW 04/15/2020 2200   APPEARANCEUR TURBID (A) 04/15/2020 2200   LABSPEC 1.010 04/15/2020 2200   PHURINE 6.0 04/15/2020 2200   GLUCOSEU NEGATIVE 04/15/2020 2200   HGBUR SMALL (A) 04/15/2020 2200   BILIRUBINUR NEGATIVE 04/15/2020 2200   KETONESUR NEGATIVE 04/15/2020 2200   PROTEINUR 100 (A) 04/15/2020 2200   NITRITE POSITIVE (A) 04/15/2020 2200   LEUKOCYTESUR LARGE (A) 04/15/2020 2200    Sepsis Labs: Lactic Acid, Venous    Component Value Date/Time   LATICACIDVEN 1.2 09/15/2021 1802    MICROBIOLOGY: Recent Results (from the past 240 hour(s))  Culture, blood (routine x 2)     Status: None   Collection Time: 09/15/21  2:56 PM   Specimen: Left Antecubital; Blood  Result Value Ref Range Status   Specimen Description LEFT ANTECUBITAL  Final   Special Requests   Final    Blood Culture adequate volume BOTTLES DRAWN AEROBIC AND ANAEROBIC   Culture   Final    NO GROWTH 5 DAYS Performed at [facility], [address] [place], [place] [address]    Report Status 09/20/2021 FINAL  Final  Culture, blood (routine x 2)     Status: None   Collection Time: 09/15/21  3:04 PM   Specimen: BLOOD LEFT HAND  Result Value Ref Range Status   Specimen Description BLOOD LEFT HAND  Final   Special Requests   Final    Blood Culture results  may not be optimal due to an excessive volume of blood received in culture bottles BOTTLES DRAWN AEROBIC AND ANAEROBIC   Culture    Final    NO GROWTH 5 DAYS Performed at [facility] [place], [address], [place], [place] [address]    Report Status 09/20/2021 FINAL  Final  Resp Panel by RT-PCR (Flu A&B, Covid) Nasopharyngeal Swab     Status: None   Collection Time: 09/15/21  5:54 PM   Specimen: Nasopharyngeal Swab; Nasopharyngeal(NP) swabs in vial transport medium  Result Value Ref Range Status   SARS Coronavirus 2 by RT PCR NEGATIVE NEGATIVE Final    Comment: (NOTE) SARS-CoV-2 target nucleic acids are NOT DETECTED.  The SARS-CoV-2 RNA is generally detectable in upper respiratory specimens during the acute phase of infection. The lowest concentration of SARS-CoV-2 viral copies this assay can detect is 138 copies/mL. A negative result does not preclude SARS-Cov-2 infection and should not be used as the sole basis for treatment or other patient management decisions. A negative result may occur with  improper specimen collection/handling, submission of specimen other than nasopharyngeal swab, presence of viral mutation(s) within the areas targeted by this assay, and inadequate number of viral copies(<138 copies/mL). A negative result must be combined with clinical observations, patient history, and epidemiological information. The expected result is Negative.  Fact Sheet for Patients:  EntrepreneurPulse.com.au  Fact Sheet for Healthcare Providers:  IncredibleEmployment.be  This test is no t yet approved or cleared by the [place] FDA and  has been authorized for detection and/or diagnosis of SARS-CoV-2 by FDA under an Emergency Use Authorization (EUA). This EUA will remain  in effect (meaning this test can be used) for the duration of the COVID-19 declaration under Section 564(b)(1) of the Act, 21 U.S.C.section 360bbb-3(b)(1), unless the authorization is terminated  or revoked sooner.       Influenza A by PCR NEGATIVE NEGATIVE Final   Influenza B by PCR NEGATIVE NEGATIVE  Final    Comment: (NOTE) The Xpert Xpress SARS-CoV-2/FLU/RSV plus assay is intended as an aid in the diagnosis of influenza from Nasopharyngeal swab specimens and should not be used as a sole basis for treatment. Nasal washings and aspirates are unacceptable for Xpert Xpress SARS-CoV-2/FLU/RSV testing.  Fact Sheet for Patients: EntrepreneurPulse.com.au  Fact Sheet for Healthcare Providers: IncredibleEmployment.be  This test is not yet approved or cleared by the [place] FDA and has been authorized for detection and/or diagnosis of SARS-CoV-2 by FDA under an Emergency Use Authorization (EUA). This EUA will remain in effect (meaning this test can be used) for the duration of the COVID-19 declaration under Section 564(b)(1) of the Act, 21 U.S.C. section 360bbb-3(b)(1), unless the authorization is terminated or revoked.  Performed at [facility], [address], [place], [place] [address]     RADIOLOGY STUDIES/RESULTS: PERIPHERAL VASCULAR CATHETERIZATION  Result Date: 09/20/2021 Images from the original result were not included.   Patient name: Kerry Underwood    MRN: [id number]        DOB: 01-13-1951          Sex: male  09/20/2021 Pre-operative Diagnosis: Left lower extremity [place] 5 critical limb ischemia Post-operative diagnosis:  Same Surgeon:  Broadus John, MD Procedure Performed: 1.  Ultrasound-guided micropuncture access of the left common femoral artery 2.  Aortogram 3.  Bilateral lower extremity angiogram 4.  Third order cannulation, right lower extremity angiogram 5.  Moderate sedation, 86 minutes   Indications: Patient is a 70 year old male who presented to the  hospital with rest pain and wounds on his right foot, with concern for infection.  ABIs demonstrated severe peripheral arterial disease, and therefore after discussing the risks and benefits of lower extremity angiography, an attempt to improve perfusion, Kyros elected to proceed   Findings: Aorta: Bilateral renal arteries patent, atretic, left-sided accessory.-Patient with end-stage renal disease. On the right, moderate aortoiliac atherosclerotic disease.  There is no flow-limiting stenosis in the common iliac, external iliac artery.  The internal iliac arteries patent.  There is significant common femoral artery disease, stenosis at the ostium of the profunda, the SFA has severe atherosclerotic disease with multiple areas of total occlusion, and reconstitution from profunda collaterals.  Distally, the superficial femoral artery is patent with severe disease, which continues into the popliteal artery.  Outflow demonstrates patent peroneal and posterior tibial arteries however there are multiple, tandem, flow-limiting stenoses within the peroneal. The posterior tibial artery appears patent, however small in size. This is likely due to being under-filled.  Runoff to the foot is posterior tibial artery dominant with both medial and lateral perforators from the peroneal artery appreciated.  On the left: No flow-limiting stenosis appreciated through the common iliac and external iliac arteries.  The internal iliac artery is widely patent.  There is severe atherosclerotic disease of the left common femoral artery which is flow-limiting.  The profunda is widely patent.  There is severe disease of the superficial femoral artery with focal mid SFA occlusion.  Distally, there is reconstitution with severe disease continued through the popliteal artery.  The tibial trifurcation appears patent. Runoff was poorly assessed due to contrast washout.  Procedure:  The patient was identified in the holding area and taken to room 8.  The patient was then placed supine on the table and prepped and draped in the usual sterile fashion.  A time out was called.  Ultrasound was used to evaluate the left common femoral artery.  It was patent .  A digital ultrasound image was acquired.  A micropuncture needle was used  to access the left common femoral artery under ultrasound guidance.  An 018 wire was advanced without resistance and a micropuncture sheath was placed.  The 018 wire was removed and a benson wire was placed.  The micropuncture sheath was exchanged for a 5 french sheath.  An omniflush catheter was advanced over the wire to the level of L-1.  An abdominal anram was obtained.  Flush catheter was then down to the terminal aorta for bilateral lower extremity angiogram.  To assess right lower extremity runoff, a flush catheter was advanced into the common femoral artery.  Intervention: A 6 x 45 cm sheath was brought into the field and advanced into the right common femoral artery.  The SFA was cannulated and true lumen confirmed using angiography.  Several wires and catheters were used, however I was unable to traverse the significant disease in the superficial femoral artery due to tandem chronic total occlusions.  Plan: Patient will be vein mapped for possible open bypass versus high risk endovascular intervention I will discuss this with the patient and discussed findings with Dr. Carlis Abbott.  Cassandria Santee, MD [facility] Office: [phone]       LOS: 6 days   Oren Binet, MD  [facility]    To contact the attending provider between 7A-7P or the covering provider during after hours 7P-7A, please log into the web site [email or web address] and access using universal [place] password for that web site.  If you do not have the password, please call the hospital operator.  09/21/2021, 12:05 PM

## 2021-09-21 NOTE — Progress Notes (Signed)
Subjective:   Seen in dialysis unit. UF goal 2L. Tolerating UF but continued foot paint  S/p arteriogram with severe R SFA disease  Objective Vital signs in last 24 hours: Vitals:   09/21/21 0830 09/21/21 0900 09/21/21 0930 09/21/21 1000  BP: 104/62 126/64 (!) 114/50 130/71  Pulse:      Resp: '11 10 11 13  '$ Temp:      TempSrc:      SpO2:      Weight:      Height:         Intake/Output Summary (Last 24 hours) at 09/21/2021 1006 Last data filed at 09/20/2021 2115 Gross per 24 hour  Intake 240 ml  Output 250 ml  Net -10 ml    Labs: Basic Metabolic Panel: Recent Labs  Lab 09/17/21 0644 09/18/21 0409 09/20/21 1226 09/21/21 0229  NA 134* 135  --  134*  K 4.3 4.0  --  4.9  CL 97* 98  --  98  CO2 27 25  --  22  GLUCOSE 97 76  --  103*  BUN 25* 35*  --  58*  CREATININE 6.79* 8.78* 12.90* 13.42*  CALCIUM 8.6* 8.4*  --  8.3*  PHOS  --   --   --  7.3*    Liver Function Tests: Recent Labs  Lab 09/17/21 0644 09/18/21 0409 09/21/21 0229  AST 41 37  --   ALT 21 18  --   ALKPHOS 65 52  --   BILITOT 0.9 0.8  --   PROT 7.2 6.1*  --   ALBUMIN 3.5 2.9* 2.7*    No results for input(s): LIPASE, AMYLASE in the last 168 hours. No results for input(s): AMMONIA in the last 168 hours. CBC: Recent Labs  Lab 09/15/21 1504 09/16/21 0642 09/17/21 0644 09/18/21 0409 09/20/21 1226  WBC 7.8 7.0 8.6 6.6 6.6  NEUTROABS 6.0  --   --   --   --   HGB 10.6* 9.0* 9.7* 8.2* 8.7*  HCT 32.3* 27.2* 29.3* 25.3* 26.5*  MCV 106.6* 104.2* 103.9* 105.0* 101.9*  PLT 160 128* 148* 130* 164    Cardiac Enzymes: No results for input(s): CKTOTAL, CKMB, CKMBINDEX, TROPONINI in the last 168 hours. CBG: No results for input(s): GLUCAP in the last 168 hours.  Iron Studies:  Recent Labs    09/21/21 0229  IRON 55  TIBC 197*  FERRITIN 1,104*   Studies/Results: PERIPHERAL VASCULAR CATHETERIZATION  Result Date: 09/20/2021 Images from the original result were not included.   Patient name:  Kerry Underwood    MRN: [id number]        DOB: 05-23-1951          Sex: male  09/20/2021 Pre-operative Diagnosis: Left lower extremity [place] 5 critical limb ischemia Post-operative diagnosis:  Same Surgeon:  Broadus John, MD Procedure Performed: 1.  Ultrasound-guided micropuncture access of the left common femoral artery 2.  Aortogram 3.  Bilateral lower extremity angiogram 4.  Third order cannulation, right lower extremity angiogram 5.  Moderate sedation, 86 minutes   Indications: Patient is a 70 year old male who presented to the hospital with rest pain and wounds on his right foot, with concern for infection.  ABIs demonstrated severe peripheral arterial disease, and therefore after discussing the risks and benefits of lower extremity angiography, an attempt to improve perfusion, Jamel elected to proceed  Findings: Aorta: Bilateral renal arteries patent, atretic, left-sided accessory.-Patient with end-stage renal disease. On the right, moderate aortoiliac atherosclerotic disease.  There is  no flow-limiting stenosis in the common iliac, external iliac artery.  The internal iliac arteries patent.  There is significant common femoral artery disease, stenosis at the ostium of the profunda, the SFA has severe atherosclerotic disease with multiple areas of total occlusion, and reconstitution from profunda collaterals.  Distally, the superficial femoral artery is patent with severe disease, which continues into the popliteal artery.  Outflow demonstrates patent peroneal and posterior tibial arteries however there are multiple, tandem, flow-limiting stenoses within the peroneal. The posterior tibial artery appears patent, however small in size. This is likely due to being under-filled.  Runoff to the foot is posterior tibial artery dominant with both medial and lateral perforators from the peroneal artery appreciated.  On the left: No flow-limiting stenosis appreciated through the common iliac and external iliac  arteries.  The internal iliac artery is widely patent.  There is severe atherosclerotic disease of the left common femoral artery which is flow-limiting.  The profunda is widely patent.  There is severe disease of the superficial femoral artery with focal mid SFA occlusion.  Distally, there is reconstitution with severe disease continued through the popliteal artery.  The tibial trifurcation appears patent. Runoff was poorly assessed due to contrast washout.  Procedure:  The patient was identified in the holding area and taken to room 8.  The patient was then placed supine on the table and prepped and draped in the usual sterile fashion.  A time out was called.  Ultrasound was used to evaluate the left common femoral artery.  It was patent .  A digital ultrasound image was acquired.  A micropuncture needle was used to access the left common femoral artery under ultrasound guidance.  An 018 wire was advanced without resistance and a micropuncture sheath was placed.  The 018 wire was removed and a benson wire was placed.  The micropuncture sheath was exchanged for a 5 french sheath.  An omniflush catheter was advanced over the wire to the level of L-1.  An abdominal anram was obtained.  Flush catheter was then down to the terminal aorta for bilateral lower extremity angiogram.  To assess right lower extremity runoff, a flush catheter was advanced into the common femoral artery.  Intervention: A 6 x 45 cm sheath was brought into the field and advanced into the right common femoral artery.  The SFA was cannulated and true lumen confirmed using angiography.  Several wires and catheters were used, however I was unable to traverse the significant disease in the superficial femoral artery due to tandem chronic total occlusions.  Plan: Patient will be vein mapped for possible open bypass versus high risk endovascular intervention I will discuss this with the patient and discussed findings with Dr. Carlis Abbott.  Cassandria Santee, MD  [facility] Office: [phone]     Medications: Infusions:  sodium chloride     sodium chloride     sodium chloride      Scheduled Medications:  (feeding supplement) PROSource Plus  30 mL Oral BID BM   aspirin EC  325 mg Oral QHS   atorvastatin  40 mg Oral Daily   calcitRIOL  0.25 mcg Oral Q T,Th,Sa-HD   calcium carbonate  500 mg Oral QHS   Chlorhexidine Gluconate Cloth  6 each Topical Q0600   clopidogrel  75 mg Oral Q breakfast   darbepoetin (ARANESP) injection - DIALYSIS  150 mcg Intravenous Q Thu-HD   fluticasone furoate-vilanterol  1 puff Inhalation Daily   heparin  5,000 Units Subcutaneous Q8H   metoprolol tartrate  25 mg Oral Q1400   multivitamin  1 tablet Oral QHS   sodium chloride flush  3 mL Intravenous Q12H   sucroferric oxyhydroxide  1,000 mg Oral TID WC   tamsulosin  0.4 mg Oral QHS   umeclidinium bromide  1 puff Inhalation Daily   vancomycin variable dose per unstable renal function (pharmacist dosing)   Does not apply See admin instructions    have reviewed scheduled and prn medications.  Physical Exam: General: nad Heart: RRR Lungs: mostly clear Abdomen: soft, non tender Extremities: right foot slightly red-  cool to touch-  necrotic second toe Dialysis Access: [facility] and also right AVF-  good thrill and bruit-  has appt 10/12 with [facility]   Dialysis Orders: Center: [facility]  on TTS . EDW 83.5kg HD Bath 2K/2.5Ca  Time 4:15 Heparin none. Access RIJ TDC BFR 400 DFR 500    Calcitriol 0.25 mcg po/HD mircera 30 q4 - last Hb 10.4, hasn't been dosed yet.   Assessment/Plan **R foot  cellulitis, PAD, critical limb ischemia:  not sure if clinically improving on current ctx,  flagyl and vanc  Bld cx neg to date.  Per primary and [facility]. S/p arteriogram.= severe R SFA disease.  For femoral endarterectomy/fem distal bypass tomorrow per [facility].    **ESRD on HD: TTS typically, ran Sat night on schedule.  HD today on schedule. UF to goal.  R foot edema seems  more cellulitis related than volume.   **Access: TDC in use. Needs 2nd stage BVT. Missed OP appt this week. Discussed with Dr. Unk Lightning (will reassess once LE more stable)    **HTN: BP variable, UF to EDW as able.  lopressor and flomax   **Anemia of CKD: Hb 8.7. Will get  ESA today    **BMM: cont home binder (velphoro), low phos diet.  Calcitriol w HD per outpt.    **Nutrition: alb 4.3, renal diet.   Lynnda Child PA-C [facility] 09/21/2021,10:06 AM

## 2021-09-22 ENCOUNTER — Inpatient Hospital Stay (HOSPITAL_COMMUNITY): Payer: 59 | Admitting: Certified Registered"

## 2021-09-22 ENCOUNTER — Encounter (HOSPITAL_COMMUNITY)
Admission: EM | Disposition: A | Discharge status: Home Health | Payer: Self-pay | Source: Home / Self Care | Attending: Internal Medicine

## 2021-09-22 DIAGNOSIS — L03115 Cellulitis of right lower limb: Secondary | ICD-10-CM | POA: Diagnosis not present

## 2021-09-22 DIAGNOSIS — I70261 Atherosclerosis of native arteries of extremities with gangrene, right leg: Secondary | ICD-10-CM

## 2021-09-22 DIAGNOSIS — I739 Peripheral vascular disease, unspecified: Secondary | ICD-10-CM | POA: Diagnosis not present

## 2021-09-22 DIAGNOSIS — N186 End stage renal disease: Secondary | ICD-10-CM | POA: Diagnosis not present

## 2021-09-22 DIAGNOSIS — I151 Hypertension secondary to other renal disorders: Secondary | ICD-10-CM | POA: Diagnosis not present

## 2021-09-22 HISTORY — PX: ENDARTERECTOMY FEMORAL: SHX5804

## 2021-09-22 HISTORY — PX: AMPUTATION TOE: SHX6595

## 2021-09-22 HISTORY — PX: FEMORAL-TIBIAL BYPASS GRAFT: SHX938

## 2021-09-22 LAB — CBC
HCT: 24.4 % — ABNORMAL LOW (ref 39.0–52.0)
Hemoglobin: 8 g/dL — ABNORMAL LOW (ref 13.0–17.0)
MCH: 33.8 pg (ref 26.0–34.0)
MCHC: 32.8 g/dL (ref 30.0–36.0)
MCV: 103 fL — ABNORMAL HIGH (ref 80.0–100.0)
Platelets: 182 10*3/uL (ref 150–400)
RBC: 2.37 MIL/uL — ABNORMAL LOW (ref 4.22–5.81)
RDW: 15.2 % (ref 11.5–15.5)
WBC: 8.9 10*3/uL (ref 4.0–10.5)
nRBC: 0.3 % — ABNORMAL HIGH (ref 0.0–0.2)

## 2021-09-22 LAB — POCT ACTIVATED CLOTTING TIME
Activated Clotting Time: 277 seconds
Activated Clotting Time: 236 seconds

## 2021-09-22 LAB — POCT I-STAT 7, (LYTES, BLD GAS, ICA,H+H)
Acid-base deficit: 2 mmol/L (ref 0.0–2.0)
Bicarbonate: 26.2 mmol/L (ref 20.0–28.0)
Calcium, Ion: 1.09 mmol/L — ABNORMAL LOW (ref 1.15–1.40)
HCT: 26 % — ABNORMAL LOW (ref 39.0–52.0)
Hemoglobin: 8.8 g/dL — ABNORMAL LOW (ref 13.0–17.0)
O2 Saturation: 100 %
Potassium: 3.8 mmol/L (ref 3.5–5.1)
Sodium: 134 mmol/L — ABNORMAL LOW (ref 135–145)
TCO2: 28 mmol/L (ref 22–32)
pCO2 arterial: 63.6 mmHg — ABNORMAL HIGH (ref 32.0–48.0)
pH, Arterial: 7.224 — ABNORMAL LOW (ref 7.350–7.450)
pO2, Arterial: 284 mmHg — ABNORMAL HIGH (ref 83.0–108.0)

## 2021-09-22 LAB — CREATININE, SERUM
Creatinine, Ser: 9.81 mg/dL — ABNORMAL HIGH (ref 0.61–1.24)
GFR, Estimated: 5 mL/min — ABNORMAL LOW (ref >60)

## 2021-09-22 LAB — MRSA NEXT GEN BY PCR, NASAL: MRSA by PCR Next Gen: DETECTED — AB

## 2021-09-22 SURGERY — CREATION, BYPASS, ARTERIAL, FEMORAL TO TIBIAL, USING GRAFT
Anesthesia: General | Site: Toe | Laterality: Right

## 2021-09-22 MED ORDER — ARTIFICIAL TEARS OPHTHALMIC OINT
TOPICAL_OINTMENT | OPHTHALMIC | Status: AC
  Filled 2021-09-22: qty 3.5

## 2021-09-22 MED ORDER — DEXAMETHASONE SODIUM PHOSPHATE 10 MG/ML IJ SOLN
INTRAMUSCULAR | Status: AC
  Filled 2021-09-22: qty 1

## 2021-09-22 MED ORDER — PROPOFOL 10 MG/ML IV BOLUS
INTRAVENOUS | Status: DC | PRN
  Administered 2021-09-22: 100 mg via INTRAVENOUS

## 2021-09-22 MED ORDER — ACETAMINOPHEN 10 MG/ML IV SOLN
1000.0000 mg | Freq: Once | INTRAVENOUS | Status: DC | PRN
  Administered 2021-09-22: 1000 mg via INTRAVENOUS

## 2021-09-22 MED ORDER — SODIUM CHLORIDE 0.9 % IV SOLN: INTRAVENOUS | Status: DC | PRN

## 2021-09-22 MED ORDER — CEFAZOLIN SODIUM-DEXTROSE 2-4 GM/100ML-% IV SOLN: 2.0000 g | Freq: Three times a day (TID) | INTRAVENOUS | Status: DC

## 2021-09-22 MED ORDER — LIDOCAINE 2% (20 MG/ML) 5 ML SYRINGE
INTRAMUSCULAR | Status: DC | PRN
  Administered 2021-09-22: 40 mg via INTRAVENOUS

## 2021-09-22 MED ORDER — HEPARIN 6000 UNIT IRRIGATION SOLUTION
Status: DC | PRN
  Administered 2021-09-22: 1

## 2021-09-22 MED ORDER — ONDANSETRON HCL 4 MG/2ML IJ SOLN
INTRAMUSCULAR | Status: AC
  Filled 2021-09-22: qty 2

## 2021-09-22 MED ORDER — POTASSIUM CHLORIDE CRYS ER 20 MEQ PO TBCR: 20.0000 meq | EXTENDED_RELEASE_TABLET | Freq: Every day | ORAL | Status: DC | PRN

## 2021-09-22 MED ORDER — ACETAMINOPHEN 160 MG/5ML PO SOLN: 325.0000 mg | Freq: Once | ORAL | Status: DC | PRN

## 2021-09-22 MED ORDER — MAGNESIUM SULFATE 2 GM/50ML IV SOLN: 2.0000 g | Freq: Every day | INTRAVENOUS | Status: DC | PRN

## 2021-09-22 MED ORDER — ACETAMINOPHEN 10 MG/ML IV SOLN
INTRAVENOUS | Status: AC
  Filled 2021-09-22: qty 100

## 2021-09-22 MED ORDER — PROPOFOL 10 MG/ML IV BOLUS
INTRAVENOUS | Status: AC
  Filled 2021-09-22: qty 20

## 2021-09-22 MED ORDER — ACETAMINOPHEN 650 MG RE SUPP: 325.0000 mg | RECTAL | Status: DC | PRN

## 2021-09-22 MED ORDER — MEPERIDINE HCL 25 MG/ML IJ SOLN: 6.2500 mg | INTRAMUSCULAR | Status: DC | PRN

## 2021-09-22 MED ORDER — HEPARIN SODIUM (PORCINE) 1000 UNIT/ML IJ SOLN
INTRAMUSCULAR | Status: DC | PRN
  Administered 2021-09-22: 2000 [IU] via INTRAVENOUS
  Administered 2021-09-22: 9000 [IU] via INTRAVENOUS

## 2021-09-22 MED ORDER — PANTOPRAZOLE SODIUM 40 MG PO TBEC
40.0000 mg | DELAYED_RELEASE_TABLET | Freq: Every day | ORAL | Status: DC
  Administered 2021-09-23 – 2021-09-25 (×3): 40 mg via ORAL
  Filled 2021-09-22 (×3): qty 1

## 2021-09-22 MED ORDER — 0.9 % SODIUM CHLORIDE (POUR BTL) OPTIME
TOPICAL | Status: DC | PRN
  Administered 2021-09-22: 2000 mL

## 2021-09-22 MED ORDER — ROCURONIUM BROMIDE 10 MG/ML (PF) SYRINGE
PREFILLED_SYRINGE | INTRAVENOUS | Status: AC
  Filled 2021-09-22: qty 10

## 2021-09-22 MED ORDER — PROTAMINE SULFATE 10 MG/ML IV SOLN
INTRAVENOUS | Status: DC | PRN
  Administered 2021-09-22: 50 mg via INTRAVENOUS

## 2021-09-22 MED ORDER — DOCUSATE SODIUM 100 MG PO CAPS
100.0000 mg | ORAL_CAPSULE | Freq: Every day | ORAL | Status: DC
  Filled 2021-09-22 (×3): qty 1

## 2021-09-22 MED ORDER — ACETAMINOPHEN 325 MG PO TABS: 325.0000 mg | ORAL_TABLET | ORAL | Status: DC | PRN

## 2021-09-22 MED ORDER — ACETAMINOPHEN 325 MG PO TABS: 325.0000 mg | ORAL_TABLET | Freq: Once | ORAL | Status: DC | PRN

## 2021-09-22 MED ORDER — SUGAMMADEX SODIUM 200 MG/2ML IV SOLN
INTRAVENOUS | Status: DC | PRN
  Administered 2021-09-22: 332.4 mg via INTRAVENOUS

## 2021-09-22 MED ORDER — CEFAZOLIN SODIUM 1 G IJ SOLR
INTRAMUSCULAR | Status: AC
  Filled 2021-09-22: qty 20

## 2021-09-22 MED ORDER — SODIUM CHLORIDE 0.9 % IV SOLN
INTRAVENOUS | Status: DC | PRN
  Administered 2021-09-22: 30 ug/min via INTRAVENOUS

## 2021-09-22 MED ORDER — HYDROMORPHONE HCL 1 MG/ML IJ SOLN
INTRAMUSCULAR | Status: AC
  Filled 2021-09-22: qty 0.5

## 2021-09-22 MED ORDER — HYDROMORPHONE HCL 1 MG/ML IJ SOLN
0.2500 mg | INTRAMUSCULAR | Status: DC | PRN
  Administered 2021-09-22: 1 mg via INTRAVENOUS

## 2021-09-22 MED ORDER — PHENYLEPHRINE 40 MCG/ML (10ML) SYRINGE FOR IV PUSH (FOR BLOOD PRESSURE SUPPORT)
PREFILLED_SYRINGE | INTRAVENOUS | Status: AC
  Filled 2021-09-22: qty 10

## 2021-09-22 MED ORDER — LABETALOL HCL 5 MG/ML IV SOLN: 10.0000 mg | INTRAVENOUS | Status: DC | PRN

## 2021-09-22 MED ORDER — PHENYLEPHRINE 40 MCG/ML (10ML) SYRINGE FOR IV PUSH (FOR BLOOD PRESSURE SUPPORT)
PREFILLED_SYRINGE | INTRAVENOUS | Status: DC | PRN
  Administered 2021-09-22: 80 ug via INTRAVENOUS

## 2021-09-22 MED ORDER — CEFAZOLIN SODIUM 1 G IJ SOLR
INTRAMUSCULAR | Status: AC
  Filled 2021-09-22: qty 10

## 2021-09-22 MED ORDER — CHLORHEXIDINE GLUCONATE CLOTH 2 % EX PADS
6.0000 | MEDICATED_PAD | Freq: Every day | CUTANEOUS | Status: DC
Start: 2021-09-22 — End: 2021-09-25
  Administered 2021-09-23 – 2021-09-25 (×3): 6 via TOPICAL

## 2021-09-22 MED ORDER — GUAIFENESIN-DM 100-10 MG/5ML PO SYRP: 15.0000 mL | ORAL_SOLUTION | ORAL | Status: DC | PRN

## 2021-09-22 MED ORDER — HEPARIN SODIUM (PORCINE) 1000 UNIT/ML IJ SOLN
INTRAMUSCULAR | Status: AC
  Filled 2021-09-22: qty 1

## 2021-09-22 MED ORDER — EPHEDRINE 5 MG/ML INJ
INTRAVENOUS | Status: AC
  Filled 2021-09-22: qty 5

## 2021-09-22 MED ORDER — HEPARIN SODIUM (PORCINE) 5000 UNIT/ML IJ SOLN
5000.0000 [IU] | Freq: Three times a day (TID) | INTRAMUSCULAR | Status: DC
  Administered 2021-09-23 – 2021-09-25 (×7): 5000 [IU] via SUBCUTANEOUS
  Filled 2021-09-22 (×7): qty 1

## 2021-09-22 MED ORDER — DEXMEDETOMIDINE HCL IN NACL 200 MCG/50ML IV SOLN
INTRAVENOUS | Status: DC | PRN
  Administered 2021-09-22 (×4): 5 ug via INTRAVENOUS

## 2021-09-22 MED ORDER — HYDROMORPHONE HCL 1 MG/ML IJ SOLN
INTRAMUSCULAR | Status: AC
  Filled 2021-09-22: qty 1

## 2021-09-22 MED ORDER — FENTANYL CITRATE (PF) 250 MCG/5ML IJ SOLN
INTRAMUSCULAR | Status: AC
  Filled 2021-09-22: qty 5

## 2021-09-22 MED ORDER — CEFAZOLIN SODIUM-DEXTROSE 2-3 GM-%(50ML) IV SOLR
INTRAVENOUS | Status: DC | PRN
  Administered 2021-09-22 (×2): 2 g via INTRAVENOUS

## 2021-09-22 MED ORDER — LACTATED RINGERS IV SOLN: INTRAVENOUS | Status: DC

## 2021-09-22 MED ORDER — DEXAMETHASONE SODIUM PHOSPHATE 10 MG/ML IJ SOLN
INTRAMUSCULAR | Status: DC | PRN
  Administered 2021-09-22: 5 mg via INTRAVENOUS

## 2021-09-22 MED ORDER — ONDANSETRON HCL 4 MG/2ML IJ SOLN
INTRAMUSCULAR | Status: DC | PRN
  Administered 2021-09-22: 4 mg via INTRAVENOUS

## 2021-09-22 MED ORDER — HYDROMORPHONE HCL 1 MG/ML IJ SOLN
INTRAMUSCULAR | Status: DC | PRN
  Administered 2021-09-22: .5 mg via INTRAVENOUS

## 2021-09-22 MED ORDER — FENTANYL CITRATE (PF) 250 MCG/5ML IJ SOLN
INTRAMUSCULAR | Status: DC | PRN
  Administered 2021-09-22 (×2): 50 ug via INTRAVENOUS
  Administered 2021-09-22: 100 ug via INTRAVENOUS
  Administered 2021-09-22: 50 ug via INTRAVENOUS

## 2021-09-22 MED ORDER — HEPARIN 6000 UNIT IRRIGATION SOLUTION
Status: AC
  Filled 2021-09-22: qty 500

## 2021-09-22 MED ORDER — HYDRALAZINE HCL 20 MG/ML IJ SOLN: 5.0000 mg | INTRAMUSCULAR | Status: DC | PRN

## 2021-09-22 MED ORDER — SODIUM CHLORIDE 0.9 % IV SOLN: 500.0000 mL | Freq: Once | INTRAVENOUS | Status: DC | PRN

## 2021-09-22 MED ORDER — PROMETHAZINE HCL 25 MG/ML IJ SOLN: 6.2500 mg | INTRAMUSCULAR | Status: DC | PRN

## 2021-09-22 MED ORDER — MUPIROCIN 2 % EX OINT
1.0000 "application " | TOPICAL_OINTMENT | Freq: Two times a day (BID) | CUTANEOUS | Status: DC
  Administered 2021-09-23 – 2021-09-25 (×4): 1 via NASAL
  Filled 2021-09-22 (×2): qty 22

## 2021-09-22 MED ORDER — AMISULPRIDE (ANTIEMETIC) 5 MG/2ML IV SOLN: 10.0000 mg | Freq: Once | INTRAVENOUS | Status: DC | PRN

## 2021-09-22 MED ORDER — ROCURONIUM BROMIDE 10 MG/ML (PF) SYRINGE
PREFILLED_SYRINGE | INTRAVENOUS | Status: DC | PRN
  Administered 2021-09-22: 60 mg via INTRAVENOUS
  Administered 2021-09-22: 10 mg via INTRAVENOUS
  Administered 2021-09-22 (×2): 20 mg via INTRAVENOUS

## 2021-09-22 MED ORDER — LIDOCAINE 2% (20 MG/ML) 5 ML SYRINGE
INTRAMUSCULAR | Status: AC
  Filled 2021-09-22: qty 5

## 2021-09-22 MED ORDER — METOPROLOL TARTRATE 5 MG/5ML IV SOLN: 2.0000 mg | INTRAVENOUS | Status: DC | PRN

## 2021-09-22 SURGICAL SUPPLY — 64 items
BAG COUNTER SPONGE SURGICOUNT (BAG) ×3 IMPLANT
BANDAGE ESMARK 6X9 LF (GAUZE/BANDAGES/DRESSINGS) IMPLANT
BLADE CLIPPER SURG (BLADE) ×3 IMPLANT
BNDG ELASTIC 4X5.8 VLCR STR LF (GAUZE/BANDAGES/DRESSINGS) ×3 IMPLANT
BNDG ESMARK 6X9 LF (GAUZE/BANDAGES/DRESSINGS)
BNDG GAUZE ELAST 4 BULKY (GAUZE/BANDAGES/DRESSINGS) ×3 IMPLANT
CANISTER SUCT 3000ML PPV (MISCELLANEOUS) ×3 IMPLANT
CANNULA VESSEL 3MM 2 BLNT TIP (CANNULA) ×3 IMPLANT
CLIP VESOCCLUDE MED 24/CT (CLIP) ×3 IMPLANT
CLIP VESOCCLUDE SM WIDE 24/CT (CLIP) ×3 IMPLANT
COVER PROBE W GEL 5X96 (DRAPES) ×3 IMPLANT
CUFF TOURN SGL QUICK 18X4 (TOURNIQUET CUFF) IMPLANT
CUFF TOURN SGL QUICK 24 (TOURNIQUET CUFF)
CUFF TOURN SGL QUICK 34 (TOURNIQUET CUFF)
CUFF TOURN SGL QUICK 42 (TOURNIQUET CUFF) IMPLANT
CUFF TRNQT CYL 24X4X16.5-23 (TOURNIQUET CUFF) IMPLANT
CUFF TRNQT CYL 34X4.125X (TOURNIQUET CUFF) IMPLANT
DERMABOND ADVANCED (GAUZE/BANDAGES/DRESSINGS) ×1
DERMABOND ADVANCED .7 DNX12 (GAUZE/BANDAGES/DRESSINGS) ×2 IMPLANT
DRAIN CHANNEL 15F RND FF W/TCR (WOUND CARE) IMPLANT
DRAPE C-ARM 42X72 X-RAY (DRAPES) IMPLANT
DRAPE HALF SHEET 40X57 (DRAPES) ×6 IMPLANT
ELECT REM PT RETURN 9FT ADLT (ELECTROSURGICAL) ×3
ELECTRODE REM PT RTRN 9FT ADLT (ELECTROSURGICAL) ×2 IMPLANT
EVACUATOR SILICONE 100CC (DRAIN) IMPLANT
GAUZE SPONGE 4X4 12PLY STRL (GAUZE/BANDAGES/DRESSINGS) ×3 IMPLANT
GLOVE SRG 8 PF TXTR STRL LF DI (GLOVE) ×2 IMPLANT
GLOVE SURG ENC MOIS LTX SZ7.5 (GLOVE) ×3 IMPLANT
GLOVE SURG UNDER POLY LF SZ8 (GLOVE) ×1
GOWN STRL REUS W/ TWL LRG LVL3 (GOWN DISPOSABLE) ×6 IMPLANT
GOWN STRL REUS W/ TWL XL LVL3 (GOWN DISPOSABLE) ×4 IMPLANT
GOWN STRL REUS W/TWL LRG LVL3 (GOWN DISPOSABLE) ×3
GOWN STRL REUS W/TWL XL LVL3 (GOWN DISPOSABLE) ×2
HEMOSTAT SPONGE AVITENE ULTRA (HEMOSTASIS) IMPLANT
KIT BASIN OR (CUSTOM PROCEDURE TRAY) ×3 IMPLANT
KIT TURNOVER KIT B (KITS) ×3 IMPLANT
LOOP VESSEL MAXI BLUE (MISCELLANEOUS) ×3 IMPLANT
NS IRRIG 1000ML POUR BTL (IV SOLUTION) ×6 IMPLANT
PACK PERIPHERAL VASCULAR (CUSTOM PROCEDURE TRAY) ×3 IMPLANT
PAD ARMBOARD 7.5X6 YLW CONV (MISCELLANEOUS) ×6 IMPLANT
PATCH VASC XENOSURE 1CMX6CM (Vascular Products) ×1 IMPLANT
PATCH VASC XENOSURE 1X6 (Vascular Products) ×2 IMPLANT
SPONGE T-LAP 18X18 ~~LOC~~+RFID (SPONGE) ×6 IMPLANT
STOPCOCK 4 WAY LG BORE MALE ST (IV SETS) IMPLANT
SUT ETHILON 3 0 PS 1 (SUTURE) ×6 IMPLANT
SUT MNCRL AB 4-0 PS2 18 (SUTURE) ×9 IMPLANT
SUT PROLENE 5 0 C 1 24 (SUTURE) ×39 IMPLANT
SUT PROLENE 6 0 BV (SUTURE) ×18 IMPLANT
SUT PROLENE 7 0 BV 1 (SUTURE) ×3 IMPLANT
SUT SILK 2 0 PERMA HAND 18 BK (SUTURE) IMPLANT
SUT SILK 2 0 SH (SUTURE) ×3 IMPLANT
SUT SILK 3 0 (SUTURE) ×3
SUT SILK 3 0 TIES 17X18 (SUTURE) ×1
SUT SILK 3-0 18XBRD TIE 12 (SUTURE) ×6 IMPLANT
SUT SILK 3-0 18XBRD TIE BLK (SUTURE) ×2 IMPLANT
SUT VIC AB 2-0 CT1 27 (SUTURE) ×2
SUT VIC AB 2-0 CT1 TAPERPNT 27 (SUTURE) ×4 IMPLANT
SUT VIC AB 3-0 SH 27 (SUTURE) ×3
SUT VIC AB 3-0 SH 27X BRD (SUTURE) ×6 IMPLANT
TOWEL GREEN STERILE (TOWEL DISPOSABLE) ×3 IMPLANT
TRAY FOLEY MTR SLVR 16FR STAT (SET/KITS/TRAYS/PACK) ×3 IMPLANT
TUBING EXTENTION W/L.L. (IV SETS) IMPLANT
UNDERPAD 30X36 HEAVY ABSORB (UNDERPADS AND DIAPERS) ×3 IMPLANT
WATER STERILE IRR 1000ML POUR (IV SOLUTION) ×3 IMPLANT

## 2021-09-22 NOTE — Discharge Instructions (Signed)
 [facility]  Discharge instructions  Lower Extremity Bypass Surgery  Please refer to the following instruction for your post-procedure care. Your surgeon or physician assistant will discuss any changes with you.  Activity  You are encouraged to walk as much as you can. You can slowly return to normal activities during the month after your surgery. Avoid strenuous activity and heavy lifting until your doctor tells you it's OK. Avoid activities such as vacuuming or swinging a golf club. Do not drive until your doctor give the OK and you are no longer taking prescription pain medications. It is also normal to have difficulty with sleep habits, eating and bowel movement after surgery. These will go away with time.  Bathing/Showering  Shower daily after you go home. Do not soak in a bathtub, hot tub, or swim until the incision heals completely.  Incision Care  Clean your incision with mild soap and water. Shower every day. Pat the area dry with a clean towel. You do not need a bandage unless otherwise instructed. Do not apply any ointments or creams to your incision. If you have open wounds you will be instructed how to care for them or a visiting nurse may be arranged for you. If you have staples or sutures along your incision they will be removed at your post-op appointment. You may have skin glue on your incision. Do not peel it off. It will come off on its own in about one week.  Wash the groin wound with soap and water daily and pat dry. (No tub bath-only shower)  Then put a dry gauze or washcloth in the groin to keep this area dry to help prevent wound infection.  Do this daily and as needed.  Do not use Vaseline or neosporin on your incisions.  Only use soap and water on your incisions and then protect and keep dry.  Diet  Resume your normal diet. There are no special food restrictions following this procedure. A low fat/ low cholesterol diet is  recommended for all patients with vascular disease. In order to heal from your surgery, it is CRITICAL to get adequate nutrition. Your body requires vitamins, minerals, and protein. Vegetables are the best source of vitamins and minerals. Vegetables also provide the perfect balance of protein. Processed food has little nutritional value, so try to avoid this.  Medications  Resume taking all your medications unless your doctor or physician assistant tells you not to. If your incision is causing pain, you may take over-the-counter pain relievers such as acetaminophen (Tylenol). If you were prescribed a stronger pain medication, please aware these medication can cause nausea and constipation. Prevent nausea by taking the medication with a snack or meal. Avoid constipation by drinking plenty of fluids and eating foods with high amount of fiber, such as fruits, vegetables, and grains. Take Colace 100 mg (an over-the-counter stool softener) twice a day as needed for constipation.  Do not take Tylenol if you are taking prescription pain medications.  Follow Up  Our office will schedule a follow up appointment 2-3 weeks following discharge.  Please call us immediately for any of the following conditions  Severe or worsening pain in your legs or feet while at rest or while walking Increase pain, redness, warmth, or drainage (pus) from your incision site(s) Fever of 101 degree or higher The swelling in your leg with the bypass suddenly worsens and becomes more painful than when you were in the hospital If you have   been instructed to feel your graft pulse then you should do so every day. If you can no longer feel this pulse, call the office immediately. Not all patients are given this instruction.  Leg swelling is common after leg bypass surgery.  The swelling should improve over a few months following surgery. To improve the swelling, you may elevate your legs above the level of your heart while you are  sitting or resting. Your surgeon or physician assistant may ask you to apply an ACE wrap or wear compression (TED) stockings to help to reduce swelling.  Reduce your risk of vascular disease  Stop smoking. If you would like help call [facility] at 1-800-QUIT-NOW (1-800-784-8669) or [place] at [phone].  Manage your cholesterol Maintain a desired weight Control your diabetes weight Control your diabetes Keep your blood pressure down  If you have any questions, please call the office at [phone]  

## 2021-09-22 NOTE — Op Note (Addendum)
Date: September 22, 2021  Preoperative diagnosis: Critical limb ischemia of the right lower extremity with necrotic right second toe  Postoperative diagnosis: Same  Procedure: 1.  Harvest of right leg great saphenous vein 2.  Right common femoral endarterectomy with bovine pericardial patch angioplasty 3.  Right common femoral to below-knee popliteal artery bypass with ipsilateral nonreversed great saphenous vein 4.  Partial right second toe amputation  Surgeon: Dr. 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  He underwent angiogram that showed multilevel occlusive disease and he had no options for endovascular revascularization.  He presents today for bypass surgery after risk benefits discussed.  An assistant was needed for exposure and to expedite the case.  Findings: The great saphenous vein was harvested from the saphenofemoral junction to the mid calf and was of good caliber.  Ultimately the common femoral artery required endarterectomy given a 70 to 80% occlusive plaque in the mid common femoral artery.  We had excellent inflow.  The distal plaque was tacked down just before the profunda given he had diffusely calcified vessels.  Sewed a bovine pericardial patch to the right common femoral artery.  The bypass was then sewn in non-reversed fashion from the common femoral artery tunneled subfascial subsartorial to the below-knee popliteal artery.  There was excellent triphasic flow in the popliteal artery distal to the bypass and brisk peroneal and PT signals at the ankle.  The right second toe was partially amputated with good bleeding.  Anesthesia: General  Details: Patient was taken to the operating room after informed consent was obtained.  Placed on the operative table in supine position.  General endotracheal anesthesia was  induced.  Bilateral groins and the right leg were then prepped and draped in usual sterile fashion.  Antibiotics were given.  Timeout was performed.  Ultimately initially made a groin incision in the right groin above the inguinal crease in horizontal fashion.  Dissected down and got the common femoral as well as SFA profunda and distal external iliac artery mobilized and all these were controlled including side branches.  The right common femoral was heavily calcified artery and endarterectomy was clearly required for anastomosis.  We then made 3 skip incisions down the right leg on the medial side over the great saphenous vein that had been marked preoperative.  The great saphenous vein was then circumferentially mobilized and all side branches were ligated between 4-0 ties and divided.  Ultimately was mobilized between the skin tunnels as well.  Distally ligated the vein in the mid calf over right angle clamp and passed the vein through all the skin tunnels and then it was oversewn at the saphenofemoral junction with 5-0 Prolene after it was transected.  The vein was then taken off the field reversed and flushed with heparinized saline.  Several branches were repaired with 6-0 Prolene.  I then went through the medial vein harvest incision on the right calf to expose the below-knee popliteal artery after reflecting the gastrocnemius and entering the popliteal space.  I used Meyerding retractors for added visualization.  The vessel were very calcified but sewable.  At that point in time, I brought a tunneler on the field and tunneled from the below-knee popliteal artery up to the common femoral subfascial subsartorial plane.  Patient was then given 100 units/kg IV heparin.  We checked an  ACT to maintain greater than 250.  The SFA profunda were controlled Vesseloops in the distal external iliac was controlled Henly clamp.  Opened the common femoral with 11 blade scalpel extended with Potts scissors.  Common femoral  endarterectomy was performed with a Garment/textile technologist.  I had excellent pulsatile inflow.  A bovine patch was then brought on the field and sewn to the common femoral artery and I did check that I had excellent backbleeding from the profunda and I also tacked down the plaque just before the takeoff of the profunda.  We had good hemostasis with the patch.  The vein was then brought on the field in non-reversed fashion.  I then spatulated the vein and the common femoral was controlled with a Cooley clamp.  I opened the anterior wall of the patch to make an arteriotomy and the vein was sewn end to side 5-0 Prolene parachute technique.  We had a pulse in the proximal bypass but we had to lyse the valves given this was nonreversed.  I used a valvulotome and carefully lysed all the valves and then we had excellent pulsatile flow distally with a good pulse in the bypass.  2 medium clips were placed on the distal bypass and then this was passed through the tunneler to maintain proper orientation.  We then straightened the leg and cut it to the appropriate length.  I used Vesseloops to control the below-knee popliteal artery.  This was opened with 11 blade scalpel and extended with Potts scissors.  This was a heavily calcified but I did see a lumen that was amendable for anastomosis.  A end to side anastomosis was performed to the below-knee popliteal artery.  Once we came off clamps, I did have to put several repair sutures in the distal anastomosis.  We checked with a Doppler and had triphasic flow in the popliteal artery distal to the bypass that almost completely went away when the bypass was clamped.  There was brisk peroneal and PT signals in the foot.  At that point in time protamine was given for reversal.  All the incisions in the groin and below-knee popliteal exposure were closed with multiple layers of 2-0 Vicryl 3-0 Vicryl 4-0 Monocryl and Dermabond.  The vein harvest incisions were closed in multiple layers of  3-0 Vicryl 4-0 Monocryl and Dermabond.  Once the sterile portion of the procedure was done, I then turned my attention to the right second toe.  [place] incision was made at the base of the toe where the tissue appeared healthy.  I then transected the phalanges joint and then used a rongeur to take the bone back to where it appeared healthy.  This was irrigated out.  There was bleeding.  The wound was closed in multiple 3-0 nylon sutures loosely in interrupted fashion.  Sterile dressings were applied.  Taken to recovery in stable condition.  Complication: None  Condition: Stable  Marty Heck, MD [facility] Office: [place]

## 2021-09-22 NOTE — Anesthesia Postprocedure Evaluation (Signed)
Anesthesia Post Note  Patient: Kerry Underwood  Procedure(s) Performed: RIGHT FEMORAL TO BELOW KNEE POPLITEAL BYPASS WITH HARVEST OF GREATER SAPHENOUS VEIN (Right: Leg Upper) ENDARTERECTOMY FEMORAL WITH PATCH ANGIOPLASTY USING 1X6CM XENOSURE PATCH (Right: Leg Upper) PARTIAL AMPUTATION SECOND RIGHT TOE (Right: Toe)     Patient location during evaluation: PACU Anesthesia Type: General Level of consciousness: awake and alert Pain management: pain level controlled Vital Signs Assessment: post-procedure vital signs reviewed and stable Respiratory status: spontaneous breathing, nonlabored ventilation, respiratory function stable and patient connected to nasal cannula oxygen Cardiovascular status: blood pressure returned to baseline and stable Postop Assessment: no apparent nausea or vomiting Anesthetic complications: no   No notable events documented.  Last Vitals:  Vitals:   09/22/21 1324 09/22/21 1421  BP: 138/69 (!) 150/74  Pulse: 89 88  Resp: 13   Temp: 36.6 C   SpO2: 100%     Last Pain:  Vitals:   09/22/21 1421  TempSrc:   PainSc: 10-Worst pain ever                 Effie Berkshire

## 2021-09-22 NOTE — Anesthesia Procedure Notes (Signed)
Procedure Name: Intubation Date/Time: 09/22/2021 7:40 AM Performed by: Annamary Carolin, CRNA Pre-anesthesia Checklist: Patient identified, Emergency Drugs available, Suction available and Patient being monitored Patient Re-evaluated:Patient Re-evaluated prior to induction Oxygen Delivery Method: Circle System Utilized Preoxygenation: Pre-oxygenation with 100% oxygen Induction Type: IV induction Ventilation: Mask ventilation without difficulty Laryngoscope Size: Mac and 3 Grade View: Grade I Tube type: Oral Tube size: 8.0 mm Number of attempts: 1 Airway Equipment and Method: Stylet Placement Confirmation: ETT inserted through vocal cords under direct vision, positive ETCO2 and breath sounds checked- equal and bilateral Secured at: 22 cm Tube secured with: Tape Dental Injury: Teeth and Oropharynx as per pre-operative assessment  Comments: With ease

## 2021-09-22 NOTE — Transfer of Care (Signed)
Immediate Anesthesia Transfer of Care Note  Patient: Kerry Underwood  Procedure(s) Performed: RIGHT FEMORAL TO BELOW KNEE POPLITEAL BYPASS WITH HARVEST OF GREATER SAPHENOUS VEIN (Right: Leg Upper) ENDARTERECTOMY FEMORAL WITH PATCH ANGIOPLASTY USING 1X6CM XENOSURE PATCH (Right: Leg Upper) PARTIAL AMPUTATION SECOND RIGHT TOE (Right: Toe)  Patient Location: PACU  Anesthesia Type:General  Level of Consciousness: awake, alert  and patient cooperative  Airway & Oxygen Therapy: Patient Spontanous Breathing and Patient connected to face mask oxygen  Post-op Assessment: Report given to RN, Post -op Vital signs reviewed and stable and Patient moving all extremities X 4  Post vital signs: Reviewed and stable  Last Vitals:  Vitals Value Taken Time  BP 133/72 09/22/21 1229  Temp    Pulse 95 09/22/21 1231  Resp 16 09/22/21 1231  SpO2 99 % 09/22/21 1231  Vitals shown include unvalidated device data.  Last Pain:  Vitals:   09/22/21 0548  TempSrc:   PainSc: 2       Patients Stated Pain Goal: 0 ([id number])  Complications: No notable events documented.

## 2021-09-22 NOTE — Progress Notes (Signed)
PROGRESS NOTE        PATIENT DETAILS Name: Kerry Underwood Age: 70 y.o. Sex: male Date of Birth: Oct 17, 1951 Admit Date: 09/15/2021 Admitting Physician Orson Eva, MD [id number], Shireen Quan, FNP  Brief Narrative: Patient is a 70 y.o. male with history of ESRD on HD, HTN, COPD who presented with right foot pain/swelling for several weeks-with blackish discoloration of his right second toe.  He was initially evaluated at [facility] transferred to [facility] for vascular surgery evaluation.  Subjective: Drowsy-mildly confused-just got back from PACU  Objective: Vitals: Blood pressure 133/72, pulse 84, temperature 97.9 F (36.6 C), resp. rate (!) 7, height '5\' 5"'$  (1.651 m), weight 83.1 kg, SpO2 100 %.   Exam: Gen Exam:not in any distress HEENT:atraumatic, normocephalic Chest: B/L clear to auscultation anteriorly CVS:S1S2 regular Abdomen:soft non tender, non distended Extremities:Right foot bandaged-did not open. Neurology: Non focal Skin: no rash   Pertinent Labs/Radiology: WBC: 6.6 Hb: 8.2 Na: 135 K: 4.0 Creatinine: 8.78  10/7>>Blood culture: No growth 10/7>> x-ray right foot: Soft tissue swelling of the foot-no osseous abnormality.  Assessment/Plan: Right foot soft tissue infection with critical limb ischemia: S/p arteriogram on 10/12 which showed severe PAD-subsequently underwent revascularization and partial amputation of the second toe today.  Continue IV antibiotics-await further input from vascular surgery.    PAD: Vascular surgery following s/p right femoral endarterectomy and femoral to below-knee popliteal bypass on 10/14-continue on aspirin/Plavix-and statin (LDL 90 on 9/14)  ESRD: On HD TTS-nephrology following-defer HD to nephrology.  Macrocytic anemia: No evidence of blood loss-on Aranesp/IV iron per nephrology-folate/vitamin B12 levels stable.    HTN: BP stable-continue metoprolol  COPD: Not in exacerbation-continue bronchodilators  BPH:  Continue Flomax  Tobacco abuse: Counseled  Obesity: Estimated body mass index is 30.49 kg/m as calculated from the following:   Height as of this encounter: '5\' 5"'$  (1.651 m).   Weight as of this encounter: 83.1 kg.    Procedures:  10/12>> lower extremity arteriogram 10/14>> partial right second toe amputation, right common femoral artery endarterectomy, right common femoral to below knee popliteal artery bypass.  Consults: Vascular surgery, nephrology DVT Prophylaxis: Heparin Code Status:Full code  Family Communication: None at bedside  Time spent: 25 minutes-Greater than 50% of this time was spent in counseling, explanation of diagnosis, planning of further management, and coordination of care.  Diet: Diet Order             Diet NPO time specified  Diet effective now                      Disposition Plan: Status is: Inpatient  Remains inpatient appropriate because:Inpatient level of care appropriate due to severity of illness  Dispo: The patient is from: Home              Anticipated d/c is to: Home              Patient currently is not medically stable to d/c.   Difficult to place patient No     Barriers to Discharge: Left lower extremity cellulitis-critical limb ischemia-on IV antibiotics-May need revascularization prior to discharge  Antimicrobial agents: Anti-infectives (From admission, onward)    Start     Dose/Rate Route Frequency Ordered Stop   09/21/21 0925  [MAR Hold]  vancomycin variable dose per unstable renal function (pharmacist dosing)        (  MAR Hold since Fri 09/22/2021 at 0642.Hold Reason: Transfer to a Procedural area)    Does not apply See admin instructions 09/21/21 0925     09/20/21 1345  vancomycin (VANCOCIN) IVPB 1000 mg/200 mL premix        1,000 mg 200 mL/hr over 60 Minutes Intravenous  Once 09/20/21 1248 09/21/21 0700   09/19/21 1200  vancomycin (VANCOCIN) IVPB 1000 mg/200 mL premix  Status:  Discontinued        1,000 mg 200  mL/hr over 60 Minutes Intravenous Every T-Th-Sa (Hemodialysis) 09/18/21 1237 09/20/21 1247   09/17/21 1200  vancomycin (VANCOREADY) IVPB 1500 mg/300 mL        1,500 mg 150 mL/hr over 120 Minutes Intravenous  Once 09/17/21 1106 09/17/21 1422   09/16/21 1000  cefTRIAXone (ROCEPHIN) 2 g in sodium chloride 0.9 % 100 mL IVPB  Status:  Discontinued       See Hyperspace for full Linked Orders Report.   2 g 200 mL/hr over 30 Minutes Intravenous Every 24 hours 09/15/21 1916 09/18/21 0856   09/15/21 1916  metroNIDAZOLE (FLAGYL) IVPB 500 mg  Status:  Discontinued       See Hyperspace for full Linked Orders Report.   500 mg 100 mL/hr over 60 Minutes Intravenous Every 8 hours 09/15/21 1916 09/18/21 0856   09/15/21 1800  vancomycin (VANCOCIN) IVPB 1000 mg/200 mL premix        1,000 mg 200 mL/hr over 60 Minutes Intravenous  Once 09/15/21 1751 09/15/21 1917   09/15/21 1800  ceFEPIme (MAXIPIME) 2 g in sodium chloride 0.9 % 100 mL IVPB        2 g 200 mL/hr over 30 Minutes Intravenous  Once 09/15/21 1751 09/15/21 1817        MEDICATIONS: Scheduled Meds:  [MAR Hold] (feeding supplement) PROSource Plus  30 mL Oral BID BM   [MAR Hold] aspirin EC  325 mg Oral QHS   [MAR Hold] atorvastatin  40 mg Oral Daily   [MAR Hold] calcitRIOL  0.25 mcg Oral Q T,Th,Sa-HD   [MAR Hold] calcium carbonate  500 mg Oral QHS   [MAR Hold] Chlorhexidine Gluconate Cloth  6 each Topical Q0600   [MAR Hold] Chlorhexidine Gluconate Cloth  6 each Topical Q0600   [MAR Hold] clopidogrel  75 mg Oral Q breakfast   [MAR Hold] darbepoetin (ARANESP) injection - DIALYSIS  150 mcg Intravenous Q Thu-HD   [MAR Hold] fluticasone furoate-vilanterol  1 puff Inhalation Daily   [MAR Hold] heparin  5,000 Units Subcutaneous Q8H   HYDROmorphone       [MAR Hold] metoprolol tartrate  25 mg Oral Q1400   [MAR Hold] multivitamin  1 tablet Oral QHS   [MAR Hold] mupirocin ointment  1 application Nasal BID   [MAR Hold] sodium chloride flush  3 mL  Intravenous Q12H   [MAR Hold] sucroferric oxyhydroxide  1,000 mg Oral TID WC   [MAR Hold] tamsulosin  0.4 mg Oral QHS   [MAR Hold] umeclidinium bromide  1 puff Inhalation Daily   [MAR Hold] vancomycin variable dose per unstable renal function (pharmacist dosing)   Does not apply See admin instructions   Continuous Infusions:  [MAR Hold] sodium chloride     [MAR Hold] sodium chloride     [MAR Hold] sodium chloride     acetaminophen     acetaminophen 1,000 mg (09/22/21 1233)   lactated ringers     PRN Meds:.[MAR Hold] sodium chloride, [MAR Hold] sodium chloride, [MAR Hold] sodium chloride, acetaminophen,  acetaminophen **OR** acetaminophen (TYLENOL) oral liquid 160 mg/5 mL, [MAR Hold] acetaminophen, [MAR Hold] alteplase, amisulpride, [MAR Hold] heparin, [MAR Hold] hydrALAZINE, HYDROmorphone (DILAUDID) injection, [MAR Hold]  HYDROmorphone (DILAUDID) injection, [MAR Hold] labetalol, meperidine (DEMEROL) injection, [MAR Hold] ondansetron (ZOFRAN) IV, [MAR Hold] ondansetron **OR** [DISCONTINUED] ondansetron (ZOFRAN) IV, [MAR Hold] oxyCODONE, promethazine, [MAR Hold] sodium chloride flush   I have personally reviewed following labs and imaging studies  LABORATORY DATA: CBC: Recent Labs  Lab 09/15/21 1504 09/16/21 0642 09/17/21 0644 09/18/21 0409 09/20/21 1226  WBC 7.8 7.0 8.6 6.6 6.6  NEUTROABS 6.0  --   --   --   --   HGB 10.6* 9.0* 9.7* 8.2* 8.7*  HCT 32.3* 27.2* 29.3* 25.3* 26.5*  MCV 106.6* 104.2* 103.9* 105.0* 101.9*  PLT 160 128* 148* 130* 164     Basic Metabolic Panel: Recent Labs  Lab 09/15/21 1504 09/16/21 0642 09/17/21 0644 09/18/21 0409 09/20/21 1226 09/21/21 0229  NA 136 136 134* 135  --  134*  K 4.7 4.6 4.3 4.0  --  4.9  CL 96* 100 97* 98  --  98  CO2 '24 22 27 25  '$ --  22  GLUCOSE 84 91 97 76  --  103*  BUN 61* 66* 25* 35*  --  58*  CREATININE 13.07* 13.84* 6.79* 8.78* 12.90* 13.42*  CALCIUM 8.5* 8.2* 8.6* 8.4*  --  8.3*  PHOS  --   --   --   --   --  7.3*      GFR: Estimated Creatinine Clearance: 4.9 mL/min (A) (by C-G formula based on SCr of 13.42 mg/dL (H)).  Liver Function Tests: Recent Labs  Lab 09/17/21 0644 09/18/21 0409 09/21/21 0229  AST 41 37  --   ALT 21 18  --   ALKPHOS 65 52  --   BILITOT 0.9 0.8  --   PROT 7.2 6.1*  --   ALBUMIN 3.5 2.9* 2.7*    No results for input(s): LIPASE, AMYLASE in the last 168 hours. No results for input(s): AMMONIA in the last 168 hours.  Coagulation Profile: No results for input(s): INR, PROTIME in the last 168 hours.  Cardiac Enzymes: No results for input(s): CKTOTAL, CKMB, CKMBINDEX, TROPONINI in the last 168 hours.  BNP (last 3 results) No results for input(s): PROBNP in the last 8760 hours.  Lipid Profile: No results for input(s): CHOL, HDL, LDLCALC, TRIG, CHOLHDL, LDLDIRECT in the last 72 hours.  Thyroid Function Tests: No results for input(s): TSH, T4TOTAL, FREET4, T3FREE, THYROIDAB in the last 72 hours.  Anemia Panel: Recent Labs    09/21/21 0229  VITAMINB12 547  FOLATE 45.7  FERRITIN 1,104*  TIBC 197*  IRON 55  RETICCTPCT 2.9     Urine analysis:    Component Value Date/Time   COLORURINE YELLOW 04/15/2020 2200   APPEARANCEUR TURBID (A) 04/15/2020 2200   LABSPEC 1.010 04/15/2020 2200   PHURINE 6.0 04/15/2020 2200   GLUCOSEU NEGATIVE 04/15/2020 2200   HGBUR SMALL (A) 04/15/2020 2200   BILIRUBINUR NEGATIVE 04/15/2020 2200   KETONESUR NEGATIVE 04/15/2020 2200   PROTEINUR 100 (A) 04/15/2020 2200   NITRITE POSITIVE (A) 04/15/2020 2200   LEUKOCYTESUR LARGE (A) 04/15/2020 2200    Sepsis Labs: Lactic Acid, Venous    Component Value Date/Time   LATICACIDVEN 1.2 09/15/2021 1802    MICROBIOLOGY: Recent Results (from the past 240 hour(s))  Culture, blood (routine x 2)     Status: None   Collection Time: 09/15/21  2:56 PM  Specimen: Left Antecubital; Blood  Result Value Ref Range Status   Specimen Description LEFT ANTECUBITAL  Final   Special Requests    Final    Blood Culture adequate volume BOTTLES DRAWN AEROBIC AND ANAEROBIC   Culture   Final    NO GROWTH 5 DAYS Performed at [facility], [address] [place], [place] [address]    Report Status 09/20/2021 FINAL  Final  Culture, blood (routine x 2)     Status: None   Collection Time: 09/15/21  3:04 PM   Specimen: BLOOD LEFT HAND  Result Value Ref Range Status   Specimen Description BLOOD LEFT HAND  Final   Special Requests   Final    Blood Culture results may not be optimal due to an excessive volume of blood received in culture bottles BOTTLES DRAWN AEROBIC AND ANAEROBIC   Culture   Final    NO GROWTH 5 DAYS Performed at [facility], [address], [place], [place] [address]    Report Status 09/20/2021 FINAL  Final  Resp Panel by RT-PCR (Flu A&B, Covid) Nasopharyngeal Swab     Status: None   Collection Time: 09/15/21  5:54 PM   Specimen: Nasopharyngeal Swab; Nasopharyngeal(NP) swabs in vial transport medium  Result Value Ref Range Status   SARS Coronavirus 2 by RT PCR NEGATIVE NEGATIVE Final    Comment: (NOTE) SARS-CoV-2 target nucleic acids are NOT DETECTED.  The SARS-CoV-2 RNA is generally detectable in upper respiratory specimens during the acute phase of infection. The lowest concentration of SARS-CoV-2 viral copies this assay can detect is 138 copies/mL. A negative result does not preclude SARS-Cov-2 infection and should not be used as the sole basis for treatment or other patient management decisions. A negative result may occur with  improper specimen collection/handling, submission of specimen other than nasopharyngeal swab, presence of viral mutation(s) within the areas targeted by this assay, and inadequate number of viral copies(<138 copies/mL). A negative result must be combined with clinical observations, patient history, and epidemiological information. The expected result is Negative.  Fact Sheet for Patients:   EntrepreneurPulse.com.au  Fact Sheet for Healthcare Providers:  IncredibleEmployment.be  This test is no t yet approved or cleared by the [place] FDA and  has been authorized for detection and/or diagnosis of SARS-CoV-2 by FDA under an Emergency Use Authorization (EUA). This EUA will remain  in effect (meaning this test can be used) for the duration of the COVID-19 declaration under Section 564(b)(1) of the Act, 21 U.S.C.section 360bbb-3(b)(1), unless the authorization is terminated  or revoked sooner.       Influenza A by PCR NEGATIVE NEGATIVE Final   Influenza B by PCR NEGATIVE NEGATIVE Final    Comment: (NOTE) The Xpert Xpress SARS-CoV-2/FLU/RSV plus assay is intended as an aid in the diagnosis of influenza from Nasopharyngeal swab specimens and should not be used as a sole basis for treatment. Nasal washings and aspirates are unacceptable for Xpert Xpress SARS-CoV-2/FLU/RSV testing.  Fact Sheet for Patients: EntrepreneurPulse.com.au  Fact Sheet for Healthcare Providers: IncredibleEmployment.be  This test is not yet approved or cleared by the [place] FDA and has been authorized for detection and/or diagnosis of SARS-CoV-2 by FDA under an Emergency Use Authorization (EUA). This EUA will remain in effect (meaning this test can be used) for the duration of the COVID-19 declaration under Section 564(b)(1) of the Act, 21 U.S.C. section 360bbb-3(b)(1), unless the authorization is terminated or revoked.  Performed at [facility], [address], [place], [place]  [address]   MRSA Next Gen by PCR, Nasal     Status: Abnormal   Collection Time: 09/22/21  3:47 AM  Result Value Ref Range Status   MRSA by PCR Next Gen MRSA DETECTED (A) NOT DETECTED Final    Comment: RESULT CALLED TO, READ BACK BY AND VERIFIED WITH: T IRBY,RN'@[id number]'$  09/22/21 [place] Performed at [facility], [place] [address]  [place], [place] [address]     RADIOLOGY STUDIES/RESULTS: VAS [place] LOWER EXTREMITY SAPHENOUS VEIN MAPPING  Result Date: 09/21/2021 LOWER EXTREMITY VEIN MAPPING Patient Name:  LITTLE STOLBERG  Date of Exam:   09/21/2021 Medical Rec #: [id number]     Accession #:    [id number] Date of Birth: Jul 08, 1951     Patient Gender: M Patient Age:   34 years Exam Location:  [facility] Procedure:      VAS [place] LOWER EXTREMITY SAPHENOUS VEIN MAPPING Referring Phys: JOSHUA ROBINS --------------------------------------------------------------------------------  Indications: Pre-op bypass, PAD  Comparison Study: No prior studies. Performing Technologist: Darlin Coco RDMS, RVT  Examination Guidelines: A complete evaluation includes B-mode imaging, spectral Doppler, color Doppler, and power Doppler as needed of all accessible portions of each vessel. Bilateral testing is considered an integral part of a complete examination. Limited examinations for reoccurring indications may be performed as noted. +---------------+-----------+----------------------+---------------+-----------+   RT Diameter  RT Findings         GSV            LT Diameter  LT Findings      (cm)                                            (cm)                  +---------------+-----------+----------------------+---------------+-----------+      0.73                     Saphenofemoral         0.60                                                   Junction                                  +---------------+-----------+----------------------+---------------+-----------+      0.51       branching     Proximal thigh         0.49       branching  +---------------+-----------+----------------------+---------------+-----------+      0.38                       Mid thigh            0.40                  +---------------+-----------+----------------------+---------------+-----------+      0.35                      Distal thigh           0.30       branching  +---------------+-----------+----------------------+---------------+-----------+      0.32  Knee              0.24                  +---------------+-----------+----------------------+---------------+-----------+      0.30       branching       Prox calf            0.30                  +---------------+-----------+----------------------+---------------+-----------+      0.30                        Mid calf            0.31                  +---------------+-----------+----------------------+---------------+-----------+      0.30       branching      Distal calf           0.32                  +---------------+-----------+----------------------+---------------+-----------+      0.39                         Ankle              0.32                  +---------------+-----------+----------------------+---------------+-----------+ Diagnosing physician: Orlie Pollen Electronically signed by Orlie Pollen on 09/21/2021 at 3:00:31 PM.    Final      LOS: 7 days   Oren Binet, MD  [facility]    To contact the attending provider between 7A-7P or the covering provider during after hours 7P-7A, please log into the web site [email or web address] and access using universal [place] password for that web site. If you do not have the password, please call the hospital operator.  09/22/2021, 12:57 PM

## 2021-09-22 NOTE — Anesthesia Preprocedure Evaluation (Signed)
Anesthesia Evaluation  Patient identified by MRN, date of birth, ID band Patient awake    Reviewed: Allergy & Precautions, NPO status , Patient's Chart, lab work & pertinent test results, reviewed documented beta blocker date and time   Airway Mallampati: I  TM Distance: >3 FB Neck ROM: Full    Dental  (+) Edentulous Upper, Edentulous Lower   Pulmonary COPD, Current Smoker,     + decreased breath sounds      Cardiovascular hypertension, + Peripheral Vascular Disease and +CHF   Rhythm:Regular Rate:Normal     Neuro/Psych CVA, No Residual Symptoms    GI/Hepatic negative GI ROS, (+) Hepatitis -  Endo/Other  negative endocrine ROS  Renal/GU ESRF and DialysisRenal disease     Musculoskeletal negative musculoskeletal ROS (+)   Abdominal Normal abdominal exam  (+)   Peds  Hematology negative hematology ROS (+)   Anesthesia Other Findings   Reproductive/Obstetrics                             Anesthesia Physical Anesthesia Plan  ASA: 3  Anesthesia Plan: General   Post-op Pain Management:    Induction: Intravenous  PONV Risk Score and Plan: 2 and Ondansetron, Midazolam and Treatment may vary due to age or medical condition  Airway Management Planned: Oral ETT  Additional Equipment: Arterial line  Intra-op Plan:   Post-operative Plan: Extubation in OR  Informed Consent: I have reviewed the patients History and Physical, chart, labs and discussed the procedure including the risks, benefits and alternatives for the proposed anesthesia with the patient or authorized representative who has indicated his/her understanding and acceptance.   Patient has DNR.  Discussed DNR with patient and Suspend DNR.   Dental advisory given  Plan Discussed with: CRNA  Anesthesia Plan Comments:         Anesthesia Quick Evaluation

## 2021-09-22 NOTE — Evaluation (Signed)
Physical Therapy Evaluation Patient Details Name: Kerry Underwood MRN: [id number] DOB: 1951-05-30 Today's Date: 09/22/2021  History of Present Illness  The pt is a 70 yo male presenting 10/07 with R foot pain, found to have critical limb ischemia (LLE > RLE). Now s/p R common fem endarterectomy with fem-pop bypass and R second toe amputation on 10/14.   Clinical Impression  Pt in bed upon arrival of PT, agreeable to evaluation at this time. Prior to admission the pt was completely independent with all mobility and ADLs, living alone and riding/caring for farm animals. The pt now presents with limitations in functional mobility, activity tolerance, strength, and dynamic stability due to above dx and resulting pain, and will continue to benefit from skilled PT to address these deficits. He was able to demo good independence with bed mobility at this time, and completed multiple sit-stand transfers with minG and RW for safety. He completed a short bout of ambulation in the room with use of RW and good stability, and will likely continue to progress well with therapies. Pt eager to return home, will benefit from continued skilled PT acutely to progress independence and activity tolerance as pt reports he has limited contacts who can assist following d/c.         Recommendations for follow up therapy are one component of a multi-disciplinary discharge planning process, led by the attending physician.  Recommendations may be updated based on patient status, additional functional criteria and insurance authorization.  Follow Up Recommendations Home health PT;Supervision - Intermittent    Equipment Recommendations  Rolling walker with 5" wheels;3in1 (PT)    Recommendations for Other Services       Precautions / Restrictions Precautions Precautions: Fall Restrictions Weight Bearing Restrictions: No Other Position/Activity Restrictions: encouraged wb through heel only due to 2nd toe amputation, no  official orders      Mobility  Bed Mobility Overal bed mobility: Modified Independent             General bed mobility comments: slight useof bed rail, no assist given    Transfers Overall transfer level: Needs assistance Equipment used: Rolling walker (2 wheeled) Transfers: Sit to/from Stand Sit to Stand: Min guard         General transfer comment: pt rising to stand impulsively, minA to steady with initial stand  Ambulation/Gait Ambulation/Gait assistance: Min guard Gait Distance (Feet): 20 Feet Assistive device: Rolling walker (2 wheeled) Gait Pattern/deviations: Decreased stance time - right;Decreased weight shift to right Gait velocity: decreased Gait velocity interpretation: <1.31 ft/sec, indicative of household ambulator General Gait Details: antalgic gait, cues to maintain proximity to RW, pt with poor adherence to cues to maintain heel wt bearing RLE    Balance Overall balance assessment: Needs assistance Sitting-balance support: No upper extremity supported;Feet supported Sitting balance-Leahy Scale: Good     Standing balance support: Bilateral upper extremity supported Standing balance-Leahy Scale: Poor Standing balance comment: benefits from BUE support for stability and pain control                             Pertinent Vitals/Pain Pain Assessment: Faces Faces Pain Scale: Hurts even more Pain Location: R foot, at toes and along incision Pain Descriptors / Indicators: Discomfort Pain Intervention(s): Limited activity within patient's tolerance;Monitored during session;Premedicated before session;Repositioned    Home Living Family/patient expects to be discharged to:: Private residence Living Arrangements: Alone Available Help at Discharge:  (pt reports none, some friends who can  check on him but infrequently) Type of Home: House Home Access: Stairs to enter Entrance Stairs-Rails:  (a pole he can hold) Entrance Stairs-Number of  Steps: 2 Home Layout: One level Home Equipment: None Additional Comments: pt has not been using shower for 1 year, washup at sink only. no AD    Prior Function Level of Independence: Independent         Comments: pt enjoys riding his mules     Hand Dominance   Dominant Hand: Right    Extremity/Trunk Assessment   Upper Extremity Assessment Upper Extremity Assessment: Overall WFL for tasks assessed    Lower Extremity Assessment Lower Extremity Assessment: RLE deficits/detail RLE Deficits / Details: limited by pain, no buckling with stance, pt cued to maintain heel wt bearing but poor adherence RLE: Unable to fully assess due to pain RLE Sensation: WNL (pt denies difference) RLE Coordination: WNL    Cervical / Trunk Assessment Cervical / Trunk Assessment: Normal  Communication   Communication: No difficulties  Cognition Arousal/Alertness: Awake/alert Behavior During Therapy: WFL for tasks assessed/performed;Impulsive Overall Cognitive Status: Impaired/Different from baseline Area of Impairment: Attention;Following commands;Safety/judgement;Problem solving                   Current Attention Level: Selective (easily distracted)   Following Commands: Follows one step commands consistently;Follows multi-step commands inconsistently Safety/Judgement: Decreased awareness of safety;Decreased awareness of deficits   Problem Solving: Requires verbal cues General Comments: cues for safety, pt with slight impulsivity and decreased command following, poor insight to assist needed and general safety      General Comments General comments (skin integrity, edema, etc.): VSS on RA    Exercises     Assessment/Plan    PT Assessment Patient needs continued PT services  PT Problem List Decreased strength;Decreased range of motion;Decreased activity tolerance;Decreased balance;Decreased mobility;Decreased safety awareness       PT Treatment Interventions DME  instruction;Gait training;Stair training;Functional mobility training;Therapeutic activities;Therapeutic exercise;Balance training;Patient/family education    PT Goals (Current goals can be found in the Care Plan section)  Acute Rehab PT Goals Patient Stated Goal: to get back to riding his mules PT Goal Formulation: With patient Time For Goal Achievement: 10/06/21 Potential to Achieve Goals: Good    Frequency Min 3X/week   Barriers to discharge Decreased caregiver support pt from home alone, reports no one he can call for assist       AM-PAC PT "6 Clicks" Mobility  Outcome Measure Help needed turning from your back to your side while in a flat bed without using bedrails?: None Help needed moving from lying on your back to sitting on the side of a flat bed without using bedrails?: A Little Help needed moving to and from a bed to a chair (including a wheelchair)?: A Little Help needed standing up from a chair using your arms (e.g., wheelchair or bedside chair)?: A Little Help needed to walk in hospital room?: A Little Help needed climbing 3-5 steps with a railing? : A Lot 6 Click Score: 18    End of Session Equipment Utilized During Treatment: Gait belt Activity Tolerance: Patient tolerated treatment well Patient left: in chair;with call bell/phone within reach;with chair alarm set Nurse Communication: Mobility status PT Visit Diagnosis: Other abnormalities of gait and mobility (R26.89);Muscle weakness (generalized) (M62.81);Pain Pain - Right/Left: Right Pain - part of body: Leg    Time: [id number] PT Time Calculation (min) (ACUTE ONLY): 31 min   Charges:   PT Evaluation $PT Eval Low Complexity: 1  Low PT Treatments $Gait Training: 8-22 mins        West Carbo, PT, DPT   Acute Rehabilitation Department Pager #: [phone]  Sandra Cockayne 09/22/2021, 5:31 PM

## 2021-09-22 NOTE — Anesthesia Procedure Notes (Signed)
Arterial Line Insertion Start/End10/14/2022 7:08 AM, 09/22/2021 7:18 AM Performed by: CRNA  Patient location: OOR procedure area. Preanesthetic checklist: patient identified, IV checked, site marked, risks and benefits discussed, surgical consent, monitors and equipment checked, pre-op evaluation, timeout performed and anesthesia consent Lidocaine 1% used for infiltration Left, radial was placed Catheter size: 20 G Hand hygiene performed , maximum sterile barriers used  and Seldinger technique used Allen's test indicative of satisfactory collateral circulation Attempts: 1 Procedure performed using ultrasound guided technique. Ultrasound Notes:anatomy identified, needle tip was noted to be adjacent to the nerve/plexus identified and no ultrasound evidence of intravascular and/or intraneural injection Following insertion, Biopatch and dressing applied. Post procedure assessment: normal and unchanged  Patient tolerated the procedure well with no immediate complications. Additional procedure comments: With ease.Marland Kitchen
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
## 2021-09-22 NOTE — Progress Notes (Signed)
[facility]   Subjective  -patient is now amendable to right second toe amputation.   Objective (!) 145/64 80 98 F (36.7 C) (Oral) 18 98%  Intake/Output Summary (Last 24 hours) at 09/22/2021 0729 Last data filed at 09/21/2021 2022 Gross per 24 hour  Intake 240 ml  Output 2150 ml  Net -1910 ml    Significant dependent rubor right foot with necrotic gangrenous right second toe  Laboratory Lab Results: Recent Labs    09/20/21 1226  WBC 6.6  HGB 8.7*  HCT 26.5*  PLT 164   BMET Recent Labs    09/20/21 1226 09/21/21 0229  NA  --  134*  K  --  4.9  CL  --  98  CO2  --  22  GLUCOSE  --  103*  BUN  --  58*  CREATININE 12.90* 13.42*  CALCIUM  --  8.3*    COAG No results found for: INR, PROTIME No results found for: PTT  Assessment/Planning:  70 year old male with critical limb ischemia and tissue loss right leg.  Plan is right common femoral endarterectomy with a fem distal bypass and right second toe amputation.  He is now amendable to toe amputation.  I discussed risk and benefits with him again.  Marty Heck 09/22/2021 7:29 AM --

## 2021-09-23 DIAGNOSIS — I151 Hypertension secondary to other renal disorders: Secondary | ICD-10-CM | POA: Diagnosis not present

## 2021-09-23 DIAGNOSIS — Z992 Dependence on renal dialysis: Secondary | ICD-10-CM

## 2021-09-23 DIAGNOSIS — Z89421 Acquired absence of other right toe(s): Secondary | ICD-10-CM

## 2021-09-23 DIAGNOSIS — I739 Peripheral vascular disease, unspecified: Secondary | ICD-10-CM | POA: Diagnosis not present

## 2021-09-23 DIAGNOSIS — Z95828 Presence of other vascular implants and grafts: Secondary | ICD-10-CM

## 2021-09-23 DIAGNOSIS — Z9889 Other specified postprocedural states: Secondary | ICD-10-CM

## 2021-09-23 DIAGNOSIS — L03115 Cellulitis of right lower limb: Secondary | ICD-10-CM | POA: Diagnosis not present

## 2021-09-23 DIAGNOSIS — N186 End stage renal disease: Secondary | ICD-10-CM | POA: Diagnosis not present

## 2021-09-23 LAB — CBC
HCT: 19.8 % — ABNORMAL LOW (ref 39.0–52.0)
Hemoglobin: 6.8 g/dL — CL (ref 13.0–17.0)
MCH: 34.7 pg — ABNORMAL HIGH (ref 26.0–34.0)
MCHC: 34.3 g/dL (ref 30.0–36.0)
MCV: 101 fL — ABNORMAL HIGH (ref 80.0–100.0)
Platelets: 171 10*3/uL (ref 150–400)
RBC: 1.96 MIL/uL — ABNORMAL LOW (ref 4.22–5.81)
RDW: 15.3 % (ref 11.5–15.5)
WBC: 7.1 10*3/uL (ref 4.0–10.5)
nRBC: 1.1 % — ABNORMAL HIGH (ref 0.0–0.2)

## 2021-09-23 LAB — RENAL FUNCTION PANEL
Albumin: 2.5 g/dL — ABNORMAL LOW (ref 3.5–5.0)
Anion gap: 14 (ref 5–15)
BUN: 49 mg/dL — ABNORMAL HIGH (ref 8–23)
CO2: 21 mmol/L — ABNORMAL LOW (ref 22–32)
Calcium: 7.7 mg/dL — ABNORMAL LOW (ref 8.9–10.3)
Chloride: 99 mmol/L (ref 98–111)
Creatinine, Ser: 10.7 mg/dL — ABNORMAL HIGH (ref 0.61–1.24)
GFR, Estimated: 5 mL/min — ABNORMAL LOW (ref >60)
Glucose, Bld: 124 mg/dL — ABNORMAL HIGH (ref 70–99)
Phosphorus: 5.7 mg/dL — ABNORMAL HIGH (ref 2.5–4.6)
Potassium: 4.6 mmol/L (ref 3.5–5.1)
Sodium: 134 mmol/L — ABNORMAL LOW (ref 135–145)

## 2021-09-23 LAB — LIPID PANEL
Cholesterol: 91 mg/dL (ref 0–200)
HDL: 19 mg/dL — ABNORMAL LOW (ref >40)
LDL Cholesterol: 54 mg/dL (ref 0–99)
Total CHOL/HDL Ratio: 4.8 RATIO
Triglycerides: 91 mg/dL (ref <150)
VLDL: 18 mg/dL (ref 0–40)

## 2021-09-23 LAB — HEMOGLOBIN AND HEMATOCRIT, BLOOD
HCT: 26.1 % — ABNORMAL LOW (ref 39.0–52.0)
Hemoglobin: 8.7 g/dL — ABNORMAL LOW (ref 13.0–17.0)

## 2021-09-23 LAB — PREPARE RBC (CROSSMATCH)

## 2021-09-23 MED ORDER — SODIUM CHLORIDE 0.9% IV SOLUTION: Freq: Once | INTRAVENOUS | Status: AC

## 2021-09-23 MED ORDER — VANCOMYCIN HCL IN DEXTROSE 1-5 GM/200ML-% IV SOLN: 1000.0000 mg | Freq: Once | INTRAVENOUS | Status: DC

## 2021-09-23 NOTE — Progress Notes (Signed)
[facility] [place]  Subjective  -no acute events overnight.   Objective (!) 145/74 83 98.6 F (37 C) (Oral) 12 99%  Intake/Output Summary (Last 24 hours) at 09/23/2021 0842 Last data filed at 09/23/2021 [id number] Gross per 24 hour  Intake 500 ml  Output 1250 ml  Net -750 ml    Right groin and right leg incisions clean dry and intact with some bruising Right second toe amputation site clean and dry with sutures Brisk right PT peroneal signals and DP  Laboratory Lab Results: Recent Labs    09/22/21 1336 09/23/21 0427  WBC 8.9 7.1  HGB 8.0* 6.8*  HCT 24.4* 19.8*  PLT 182 171   BMET Recent Labs    09/21/21 0229 09/22/21 1044 09/22/21 1336 09/23/21 0426  NA 134* 134*  --  134*  K 4.9 3.8  --  4.6  CL 98  --   --  99  CO2 22  --   --  21*  GLUCOSE 103*  --   --  124*  BUN 58*  --   --  49*  CREATININE 13.42*  --  9.81* 10.70*  CALCIUM 8.3*  --   --  7.7*    COAG No results found for: INR, PROTIME No results found for: PTT  Assessment/Planning:  70 year old male postop day 1 status post right common femoral endarterectomy with a right common femoral to below-knee pop bypass with great saphenous vein for CLI with tissue loss.  We also did a partial amputation of the right second toe.  Incisions look good today.  He has excellent Doppler signals in the foot.  Agree with 1 unit packed red blood cell for hemoglobin of 6.8 down from 8 preop.  After dialysis PT OT mobilize.  Marty Heck 09/23/2021 8:42 AM --

## 2021-09-23 NOTE — Progress Notes (Signed)
PHARMACIST LIPID MONITORING  Kerry Underwood is a 70 y.o. male admitted on 09/15/2021 with PAD s/p R fem endarterectomy and femoral to below-knee bypass on 10/14.  Pharmacy has been consulted to optimize lipid-lowering therapy with the indication of secondary prevention for clinical ASCVD.  Recent Labs:  Lipid Panel (last 6 months):   Lab Results  Component Value Date   CHOL 91 09/23/2021   TRIG 91 09/23/2021   HDL 19 (L) 09/23/2021   CHOLHDL 4.8 09/23/2021   VLDL 18 09/23/2021   LDLCALC 54 09/23/2021    Hepatic function panel (last 6 months):   Lab Results  Component Value Date   AST 37 09/18/2021   ALT 18 09/18/2021   ALKPHOS 52 09/18/2021   BILITOT 0.8 09/18/2021    SCr (since admission):   Serum creatinine: 10.7 mg/dL (H) 09/23/21 0426 Estimated creatinine clearance: 6.2 mL/min (A)  Current therapy and lipid therapy tolerance Current lipid-lowering therapy: atorvastatin 40 mg daily (newly started inpatient) Previous lipid-lowering therapies (if applicable): none Documented or reported allergies or intolerances to lipid-lowering therapies (if applicable): none  Assessment:  70 YOM with PAD now s/p endarterectomy & bypass. Pt not taking statin PTA and LDL in September (9/24) was above goal at 90 (goal <70). Patient started on high-intensity statin during this admission and most recent lipid panel shows LDL of 50 is at goal.  Plan:    1.Statin intensity (high intensity recommended for all patients regardless of the LDL):  No statin changes. The patient is already on a high intensity statin.  2.Add ezetimibe (if any one of the following):   Not indicated at this time.  3.Refer to lipid clinic:   No  4.Follow-up with:  Vascular surgery - 10/10/21 with Dr. Carlis Abbott  5.Follow-up labs after discharge:  Changes in lipid therapy were made. Check a lipid panel in 8-12 weeks then annually.      Laurey Arrow, PharmD PGY1 Pharmacy Resident 09/23/2021  1:57 PM  Please check  [email or web address] for unit-specific pharmacy phone numbers.

## 2021-09-23 NOTE — Progress Notes (Addendum)
Overnight progress note  Hemoglobin 6.8 on labs this morning, was 8.0 yesterday.  Likely due to blood loss from surgery.   -Type and screen, 1 unit PRBCs ordered after obtaining verbal consent from the patient.  Follow posttransfusion H&H.

## 2021-09-23 NOTE — Progress Notes (Signed)
PROGRESS NOTE        PATIENT DETAILS Name: Kerry Underwood Age: 70 y.o. Sex: male Date of Birth: [birth year] Admit Date: 09/15/2021 Admitting Physician Orson Eva, MD [id number], Shireen Quan, FNP  Brief Narrative: Patient is a 70 y.o. male with history of ESRD on HD, HTN, COPD who presented with right foot pain/swelling for several weeks-with blackish discoloration of his right second toe.  He was initially evaluated at [facility] transferred to [facility] for vascular surgery evaluation.  Subjective: Right foot swelling decreased-still some erythema present on the dorsum but decreased as well.  Objective: Vitals: Blood pressure 139/67, pulse 84, temperature 98.2 F (36.8 C), temperature source Axillary, resp. rate 19, height '5\' 5"'$  (1.651 m), weight 82.6 kg, SpO2 97 %.   Exam: Gen Exam:Alert awake-not in any distress HEENT:atraumatic, normocephalic Chest: B/L clear to auscultation anteriorly CVS:S1S2 regular Abdomen:soft non tender, non distended Extremities: Decreased swelling/erythema of right foot. Neurology: Non focal Skin: no rash   Pertinent Labs/Radiology: WBC: 7.1 Hb: 6.8 Na: 134  K: 4.6    10/7>>Blood culture: No growth 10/7>> x-ray right foot: Soft tissue swelling of the foot-no osseous abnormality.  Assessment/Plan: Right foot soft tissue infection with critical limb ischemia: S/p arteriogram on 10/12 which showed severe PAD-subsequently underwent revascularization and partial amputation of the second toe on 10/14.  Await further recommendations from vascular surgery-we will get last dose of IV vancomycin with HD today.  PAD: Vascular surgery following s/p right femoral endarterectomy and femoral to below-knee popliteal bypass on 10/14-continue on aspirin/Plavix-and statin (LDL 90 on 9/14)  ESRD: On HD TTS-nephrology following-defer HD to nephrology.  Macrocytic anemia: Multifactorial-drop in hemoglobin overnight probably reflective of some  perioperative blood loss-superimposed on anemia of chronic disease.  Will get 1 unit of PRBC today-IV iron/Aranesp per nephrology.  Folate/vitamin B12 levels stable.    HTN: BP stable-continue metoprolol  COPD: Not in exacerbation-continue bronchodilators  BPH: Continue Flomax  Tobacco abuse: Counseled  Obesity: Estimated body mass index is 30.3 kg/m as calculated from the following:   Height as of this encounter: '5\' 5"'$  (1.651 m).   Weight as of this encounter: 82.6 kg.    Procedures:  10/12>> lower extremity arteriogram 10/14>> partial right second toe amputation, right common femoral artery endarterectomy, right common femoral to below knee popliteal artery bypass.  Consults: Vascular surgery, nephrology DVT Prophylaxis: Heparin Code Status:Full code  Family Communication: None at bedside  Time spent: 25 minutes-Greater than 50% of this time was spent in counseling, explanation of diagnosis, planning of further management, and coordination of care.  Diet: Diet Order             Diet renal with fluid restriction Fluid restriction: 1500 mL Fluid; Room service appropriate? Yes with Assist; Fluid consistency: Thin  Diet effective now                      Disposition Plan: Status is: Inpatient  Remains inpatient appropriate because:Inpatient level of care appropriate due to severity of illness  Dispo: The patient is from: Home              Anticipated d/c is to: Home              Patient currently is not medically stable to d/c.   Difficult to place patient No     Barriers to Discharge:  Left lower extremity cellulitis-critical limb ischemia-on IV antibiotics-May need revascularization prior to discharge  Antimicrobial agents: Anti-infectives (From admission, onward)    Start     Dose/Rate Route Frequency Ordered Stop   09/23/21 0900  vancomycin (VANCOCIN) IVPB 1000 mg/200 mL premix  Status:  Discontinued        1,000 mg 200 mL/hr over 60 Minutes  Intravenous  Once 09/23/21 0852 09/23/21 0859   09/22/21 1415  ceFAZolin (ANCEF) IVPB 2g/100 mL premix  Status:  Discontinued        2 g 200 mL/hr over 30 Minutes Intravenous Every 8 hours 09/22/21 1323 09/22/21 1327   09/21/21 0925  vancomycin variable dose per unstable renal function (pharmacist dosing)  Status:  Discontinued         Does not apply See admin instructions 09/21/21 0925 09/23/21 0859   09/20/21 1345  vancomycin (VANCOCIN) IVPB 1000 mg/200 mL premix        1,000 mg 200 mL/hr over 60 Minutes Intravenous  Once 09/20/21 1248 09/21/21 0700   09/19/21 1200  vancomycin (VANCOCIN) IVPB 1000 mg/200 mL premix  Status:  Discontinued        1,000 mg 200 mL/hr over 60 Minutes Intravenous Every T-Th-Sa (Hemodialysis) 09/18/21 1237 09/20/21 1247   09/17/21 1200  vancomycin (VANCOREADY) IVPB 1500 mg/300 mL        1,500 mg 150 mL/hr over 120 Minutes Intravenous  Once 09/17/21 1106 09/17/21 1422   09/16/21 1000  cefTRIAXone (ROCEPHIN) 2 g in sodium chloride 0.9 % 100 mL IVPB  Status:  Discontinued       See Hyperspace for full Linked Orders Report.   2 g 200 mL/hr over 30 Minutes Intravenous Every 24 hours 09/15/21 1916 09/18/21 0856   09/15/21 1916  metroNIDAZOLE (FLAGYL) IVPB 500 mg  Status:  Discontinued       See Hyperspace for full Linked Orders Report.   500 mg 100 mL/hr over 60 Minutes Intravenous Every 8 hours 09/15/21 1916 09/18/21 0856   09/15/21 1800  vancomycin (VANCOCIN) IVPB 1000 mg/200 mL premix        1,000 mg 200 mL/hr over 60 Minutes Intravenous  Once 09/15/21 1751 09/15/21 1917   09/15/21 1800  ceFEPIme (MAXIPIME) 2 g in sodium chloride 0.9 % 100 mL IVPB        2 g 200 mL/hr over 30 Minutes Intravenous  Once 09/15/21 1751 09/15/21 1817        MEDICATIONS: Scheduled Meds:  (feeding supplement) PROSource Plus  30 mL Oral BID BM   aspirin EC  325 mg Oral QHS   atorvastatin  40 mg Oral Daily   calcitRIOL  0.25 mcg Oral Q T,Th,Sa-HD   calcium carbonate  500 mg  Oral QHS   Chlorhexidine Gluconate Cloth  6 each Topical Q0600   clopidogrel  75 mg Oral Q breakfast   darbepoetin (ARANESP) injection - DIALYSIS  150 mcg Intravenous Q Thu-HD   docusate sodium  100 mg Oral Daily   fluticasone furoate-vilanterol  1 puff Inhalation Daily   heparin  5,000 Units Subcutaneous Q8H   metoprolol tartrate  25 mg Oral Q1400   multivitamin  1 tablet Oral QHS   mupirocin ointment  1 application Nasal BID   pantoprazole  40 mg Oral Daily   sucroferric oxyhydroxide  1,000 mg Oral TID WC   tamsulosin  0.4 mg Oral QHS   umeclidinium bromide  1 puff Inhalation Daily   Continuous Infusions:  sodium chloride  sodium chloride     sodium chloride     magnesium sulfate bolus IVPB     PRN Meds:.sodium chloride, sodium chloride, sodium chloride, acetaminophen **OR** acetaminophen, alteplase, guaiFENesin-dextromethorphan, heparin, hydrALAZINE, HYDROmorphone (DILAUDID) injection, labetalol, magnesium sulfate bolus IVPB, metoprolol tartrate, ondansetron **OR** [DISCONTINUED] ondansetron (ZOFRAN) IV, oxyCODONE   I have personally reviewed following labs and imaging studies  LABORATORY DATA: CBC: Recent Labs  Lab 09/17/21 0644 09/18/21 0409 09/20/21 1226 09/22/21 1044 09/22/21 1336 09/23/21 0427  WBC 8.6 6.6 6.6  --  8.9 7.1  HGB 9.7* 8.2* 8.7* 8.8* 8.0* 6.8*  HCT 29.3* 25.3* 26.5* 26.0* 24.4* 19.8*  MCV 103.9* 105.0* 101.9*  --  103.0* 101.0*  PLT 148* 130* 164  --  182 171     Basic Metabolic Panel: Recent Labs  Lab 09/17/21 0644 09/18/21 0409 09/20/21 1226 09/21/21 0229 09/22/21 1044 09/22/21 1336 09/23/21 0426  NA 134* 135  --  134* 134*  --  134*  K 4.3 4.0  --  4.9 3.8  --  4.6  CL 97* 98  --  98  --   --  99  CO2 27 25  --  22  --   --  21*  GLUCOSE 97 76  --  103*  --   --  124*  BUN 25* 35*  --  58*  --   --  49*  CREATININE 6.79* 8.78* 12.90* 13.42*  --  9.81* 10.70*  CALCIUM 8.6* 8.4*  --  8.3*  --   --  7.7*  PHOS  --   --   --  7.3*   --   --  5.7*     GFR: Estimated Creatinine Clearance: 6.2 mL/min (A) (by C-G formula based on SCr of 10.7 mg/dL (H)).  Liver Function Tests: Recent Labs  Lab 09/17/21 0644 09/18/21 0409 09/21/21 0229 09/23/21 0426  AST 41 37  --   --   ALT 21 18  --   --   ALKPHOS 65 52  --   --   BILITOT 0.9 0.8  --   --   PROT 7.2 6.1*  --   --   ALBUMIN 3.5 2.9* 2.7* 2.5*    No results for input(s): LIPASE, AMYLASE in the last 168 hours. No results for input(s): AMMONIA in the last 168 hours.  Coagulation Profile: No results for input(s): INR, PROTIME in the last 168 hours.  Cardiac Enzymes: No results for input(s): CKTOTAL, CKMB, CKMBINDEX, TROPONINI in the last 168 hours.  BNP (last 3 results) No results for input(s): PROBNP in the last 8760 hours.  Lipid Profile: Recent Labs    09/23/21 0427  CHOL 91  HDL 19*  LDLCALC 54  TRIG 91  CHOLHDL 4.8    Thyroid Function Tests: No results for input(s): TSH, T4TOTAL, FREET4, T3FREE, THYROIDAB in the last 72 hours.  Anemia Panel: Recent Labs    09/21/21 0229  VITAMINB12 547  FOLATE 45.7  FERRITIN 1,104*  TIBC 197*  IRON 55  RETICCTPCT 2.9     Urine analysis:    Component Value Date/Time   COLORURINE YELLOW 04/15/2020 2200   APPEARANCEUR TURBID (A) 04/15/2020 2200   LABSPEC 1.010 04/15/2020 2200   PHURINE 6.0 04/15/2020 2200   GLUCOSEU NEGATIVE 04/15/2020 2200   HGBUR SMALL (A) 04/15/2020 2200   BILIRUBINUR NEGATIVE 04/15/2020 2200   KETONESUR NEGATIVE 04/15/2020 2200   PROTEINUR 100 (A) 04/15/2020 2200   NITRITE POSITIVE (A) 04/15/2020 2200   LEUKOCYTESUR LARGE (  A) 04/15/2020 2200    Sepsis Labs: Lactic Acid, Venous    Component Value Date/Time   LATICACIDVEN 1.2 09/15/2021 1802    MICROBIOLOGY: Recent Results (from the past 240 hour(s))  Culture, blood (routine x 2)     Status: None   Collection Time: 09/15/21  2:56 PM   Specimen: Left Antecubital; Blood  Result Value Ref Range Status   Specimen  Description LEFT ANTECUBITAL  Final   Special Requests   Final    Blood Culture adequate volume BOTTLES DRAWN AEROBIC AND ANAEROBIC   Culture   Final    NO GROWTH 5 DAYS Performed at [facility], [address] [place], [place] [address]    Report Status 09/20/2021 FINAL  Final  Culture, blood (routine x 2)     Status: None   Collection Time: 09/15/21  3:04 PM   Specimen: BLOOD LEFT HAND  Result Value Ref Range Status   Specimen Description BLOOD LEFT HAND  Final   Special Requests   Final    Blood Culture results may not be optimal due to an excessive volume of blood received in culture bottles BOTTLES DRAWN AEROBIC AND ANAEROBIC   Culture   Final    NO GROWTH 5 DAYS Performed at [facility], [address], [place], [place] [address]    Report Status 09/20/2021 FINAL  Final  Resp Panel by RT-PCR (Flu A&B, Covid) Nasopharyngeal Swab     Status: None   Collection Time: 09/15/21  5:54 PM   Specimen: Nasopharyngeal Swab; Nasopharyngeal(NP) swabs in vial transport medium  Result Value Ref Range Status   SARS Coronavirus 2 by RT PCR NEGATIVE NEGATIVE Final    Comment: (NOTE) SARS-CoV-2 target nucleic acids are NOT DETECTED.  The SARS-CoV-2 RNA is generally detectable in upper respiratory specimens during the acute phase of infection. The lowest concentration of SARS-CoV-2 viral copies this assay can detect is 138 copies/mL. A negative result does not preclude SARS-Cov-2 infection and should not be used as the sole basis for treatment or other patient management decisions. A negative result may occur with  improper specimen collection/handling, submission of specimen other than nasopharyngeal swab, presence of viral mutation(s) within the areas targeted by this assay, and inadequate number of viral copies(<138 copies/mL). A negative result must be combined with clinical observations, patient history, and epidemiological information. The expected result is Negative.  Fact Sheet  for Patients:  EntrepreneurPulse.com.au  Fact Sheet for Healthcare Providers:  IncredibleEmployment.be  This test is no t yet approved or cleared by the [place] FDA and  has been authorized for detection and/or diagnosis of SARS-CoV-2 by FDA under an Emergency Use Authorization (EUA). This EUA will remain  in effect (meaning this test can be used) for the duration of the COVID-19 declaration under Section 564(b)(1) of the Act, 21 U.S.C.section 360bbb-3(b)(1), unless the authorization is terminated  or revoked sooner.       Influenza A by PCR NEGATIVE NEGATIVE Final   Influenza B by PCR NEGATIVE NEGATIVE Final    Comment: (NOTE) The Xpert Xpress SARS-CoV-2/FLU/RSV plus assay is intended as an aid in the diagnosis of influenza from Nasopharyngeal swab specimens and should not be used as a sole basis for treatment. Nasal washings and aspirates are unacceptable for Xpert Xpress SARS-CoV-2/FLU/RSV testing.  Fact Sheet for Patients: EntrepreneurPulse.com.au  Fact Sheet for Healthcare Providers: IncredibleEmployment.be  This test is not yet approved or cleared by the [place] FDA and has been authorized for detection and/or diagnosis of  SARS-CoV-2 by FDA under an Emergency Use Authorization (EUA). This EUA will remain in effect (meaning this test can be used) for the duration of the COVID-19 declaration under Section 564(b)(1) of the Act, 21 U.S.C. section 360bbb-3(b)(1), unless the authorization is terminated or revoked.  Performed at [facility], [address] [place], [place] [address]   MRSA Next Gen by PCR, Nasal     Status: Abnormal   Collection Time: 09/22/21  3:47 AM  Result Value Ref Range Status   MRSA by PCR Next Gen MRSA DETECTED (A) NOT DETECTED Final    Comment: RESULT CALLED TO, READ BACK BY AND VERIFIED WITH: T IRBY,RN'@0534'$  09/22/21 [place] Performed at [facility], [place] [address] [place], [place] [address]     RADIOLOGY STUDIES/RESULTS: No results found.   LOS: 8 days   Oren Binet, MD  [facility]    To contact the attending provider between 7A-7P or the covering provider during after hours 7P-7A, please log into the web site [email or web address] and access using universal [place] password for that web site. If you do not have the password, please call the hospital operator.  09/23/2021, 2:53 PM

## 2021-09-23 NOTE — Progress Notes (Signed)
OT Cancellation Note  Patient Details Name: Kerry Underwood MRN: [id number] DOB: Apr 30, 1951   Cancelled Treatment:    Reason Eval/Treat Not Completed: Patient at procedure or test/ unavailable (Pt in HD.)  Malka So 09/23/2021, 8:18 AM Nestor Lewandowsky, OTR/L Acute Rehabilitation Services Pager: [phone] Office: [phone]

## 2021-09-23 NOTE — Plan of Care (Signed)
  Problem: Clinical Measurements: Goal: Will remain free from infection Outcome: Progressing Goal: Cardiovascular complication will be avoided Outcome: Progressing   Problem: Activity: Goal: Risk for activity intolerance will decrease Outcome: Progressing   Problem: Health Behavior/Discharge Planning: Goal: Ability to manage health-related needs will improve Outcome: Not Progressing

## 2021-09-23 NOTE — Progress Notes (Signed)
OT Cancellation Note  Patient Details Name: Marques Luthman MRN: [id number] DOB: 1951/08/20   Cancelled Treatment:    Reason Eval/Treat Not Completed: Medical issues which prohibited therapy (Pt with hgb 6.8, receiving transfusion.)  Malka So 09/23/2021, 2:00 PM Nestor Lewandowsky, OTR/L Acute Rehabilitation Services Pager: [phone] Office: [phone]

## 2021-09-23 NOTE — Progress Notes (Signed)
[name] Progress Note   Subjective:  Seen on HD - 2L UFG. Plan for 1U PRBCs with HD today. Denies CP/dyspnea. RLE is hurting - says "like a toothache."  Objective Vitals:   09/22/21 1800 09/22/21 1955 09/22/21 2322 09/23/21 0358  BP: (!) 123/58 120/64 118/67 105/60  Pulse: 74 76 72 76  Resp:  '12 12 18  '$ Temp:  98.5 F (36.9 C) 98.2 F (36.8 C) 98.5 F (36.9 C)  TempSrc:  Oral Oral Oral  SpO2: 100% 100% 100% 100%  Weight:      Height:       Physical Exam General: Chronically ill appearing man, NAD. Room air. Heart: RRR; no murmur Lungs: CTA anteriorly Abdomen: soft Extremities: No LE edema; RLE with long medial incision; foot with erythema and partial 2nd toe amputation Dialysis Access: TDC and RUE 1st stage BVT (maturing)  Additional Objective Labs: Basic Metabolic Panel: Recent Labs  Lab 09/18/21 0409 09/20/21 1226 09/21/21 0229 09/22/21 1044 09/22/21 1336 09/23/21 0426  NA 135  --  134* 134*  --  134*  K 4.0  --  4.9 3.8  --  4.6  CL 98  --  98  --   --  99  CO2 25  --  22  --   --  21*  GLUCOSE 76  --  103*  --   --  124*  BUN 35*  --  58*  --   --  49*  CREATININE 8.78*   < > 13.42*  --  9.81* 10.70*  CALCIUM 8.4*  --  8.3*  --   --  7.7*  PHOS  --   --  7.3*  --   --  5.7*   < > = values in this interval not displayed.   Liver Function Tests: Recent Labs  Lab 09/17/21 0644 09/18/21 0409 09/21/21 0229 09/23/21 0426  AST 41 37  --   --   ALT 21 18  --   --   ALKPHOS 65 52  --   --   BILITOT 0.9 0.8  --   --   PROT 7.2 6.1*  --   --   ALBUMIN 3.5 2.9* 2.7* 2.5*   CBC: Recent Labs  Lab 09/17/21 0644 09/18/21 0409 09/20/21 1226 09/22/21 1044 09/22/21 1336 09/23/21 0427  WBC 8.6 6.6 6.6  --  8.9 7.1  HGB 9.7* 8.2* 8.7* 8.8* 8.0* 6.8*  HCT 29.3* 25.3* 26.5* 26.0* 24.4* 19.8*  MCV 103.9* 105.0* 101.9*  --  103.0* 101.0*  PLT 148* 130* 164  --  182 171   Iron Studies:  Recent Labs    09/21/21 0229  IRON 55  TIBC 197*   FERRITIN 1,104*   Studies/Results: VAS [place] LOWER EXTREMITY SAPHENOUS VEIN MAPPING  Result Date: 09/21/2021 [place] MAPPING Patient Name:  Kerry Underwood  Date of Exam:   09/21/2021 Medical Rec #: [id number]     Accession #:    [id number] Date of Birth: [birth year]     Patient Gender: M Patient Age:   70 years Exam Location:  [facility] Procedure:      VAS [place] LOWER EXTREMITY [place] Referring Phys: JOSHUA ROBINS --------------------------------------------------------------------------------  Indications: Pre-op bypass, PAD  Comparison Study: No prior studies. Performing Technologist: Darlin Coco RDMS, RVT  Examination Guidelines: A complete evaluation includes B-mode imaging, spectral Doppler, color Doppler, and power Doppler as needed of all accessible portions of each vessel. Bilateral testing is considered an integral  part of a complete examination. Limited examinations for reoccurring indications may be performed as noted. +---------------+-----------+----------------------+---------------+-----------+   RT Diameter  RT Findings         GSV            LT Diameter  LT Findings      (cm)                                            (cm)                  +---------------+-----------+----------------------+---------------+-----------+      0.73                     Saphenofemoral         0.60                                                   Junction                                  +---------------+-----------+----------------------+---------------+-----------+      0.51       branching     Proximal thigh         0.49       branching  +---------------+-----------+----------------------+---------------+-----------+      0.38                       Mid thigh            0.40                  +---------------+-----------+----------------------+---------------+-----------+      0.35                      Distal thigh          0.30        branching  +---------------+-----------+----------------------+---------------+-----------+      0.32                          Knee              0.24                  +---------------+-----------+----------------------+---------------+-----------+      0.30       branching       Prox calf            0.30                  +---------------+-----------+----------------------+---------------+-----------+      0.30                        Mid calf            0.31                  +---------------+-----------+----------------------+---------------+-----------+      0.30       branching      Distal calf           0.32                  +---------------+-----------+----------------------+---------------+-----------+  0.39                         Ankle              0.32                  +---------------+-----------+----------------------+---------------+-----------+ Diagnosing physician: Orlie Pollen Electronically signed by Orlie Pollen on 09/21/2021 at 3:00:31 PM.    Final    Medications:  sodium chloride     sodium chloride     sodium chloride     magnesium sulfate bolus IVPB      (feeding supplement) PROSource Plus  30 mL Oral BID BM   sodium chloride   Intravenous Once   aspirin EC  325 mg Oral QHS   atorvastatin  40 mg Oral Daily   calcitRIOL  0.25 mcg Oral Q T,Th,Sa-HD   calcium carbonate  500 mg Oral QHS   Chlorhexidine Gluconate Cloth  6 each Topical Q0600   clopidogrel  75 mg Oral Q breakfast   darbepoetin (ARANESP) injection - DIALYSIS  150 mcg Intravenous Q Thu-HD   docusate sodium  100 mg Oral Daily   fluticasone furoate-vilanterol  1 puff Inhalation Daily   heparin  5,000 Units Subcutaneous Q8H   metoprolol tartrate  25 mg Oral Q1400   multivitamin  1 tablet Oral QHS   mupirocin ointment  1 application Nasal BID   pantoprazole  40 mg Oral Daily   sucroferric oxyhydroxide  1,000 mg Oral TID WC   tamsulosin  0.4 mg Oral QHS   umeclidinium bromide  1 puff  Inhalation Daily   vancomycin variable dose per unstable renal function (pharmacist dosing)   Does not apply See admin instructions    Dialysis Orders: [facility]  on TTS . EDW 83.5kg HD Bath 2K/2.5Ca  Time 4:15 Heparin none. Access RIJ TDC BFR 400 DFR 500    Calcitriol 0.25 mcg po/HD mircera 30 q4 - last Hb 10.4, hasn't been dosed yet.    Assessment/Plan: 1. R foot cellulitis/critical limb ischemia: S/p R fem-pop bypass, R femoral endarterectomy, R 2nd toe partial amputation 10/14. On Vanc/Ceftriaxone/Flagyl. 2. ESRD: Continue HD on TTS schedule -> HD now, 2L UFG and tolerated. 3. HTN/volume:  BP stable, continue home meds and UF with next HD (slightly below dry weight today) 4. Anemia: Hgb 8.7 -> 6.8 today, for 1U PRBCs. Continue Aranesp 113mg q Thursday 5. Secondary hyperparathyroidism:  Ca ok, Phos high - continue home binders and calcitriol 6. Nutrition: Alb low, continue supplements.  KVeneta Penton PA-C 09/23/2021, 8:28 AM  [employer]

## 2021-09-24 DIAGNOSIS — L03115 Cellulitis of right lower limb: Secondary | ICD-10-CM | POA: Diagnosis not present

## 2021-09-24 DIAGNOSIS — I739 Peripheral vascular disease, unspecified: Secondary | ICD-10-CM | POA: Diagnosis not present

## 2021-09-24 DIAGNOSIS — I151 Hypertension secondary to other renal disorders: Secondary | ICD-10-CM | POA: Diagnosis not present

## 2021-09-24 DIAGNOSIS — N186 End stage renal disease: Secondary | ICD-10-CM | POA: Diagnosis not present

## 2021-09-24 LAB — BPAM RBC
Blood Product Expiration Date: 202211022359
ISSUE DATE / TIME: 202210151258
Unit Type and Rh: 6200

## 2021-09-24 LAB — RENAL FUNCTION PANEL
Albumin: 2.5 g/dL — ABNORMAL LOW (ref 3.5–5.0)
Anion gap: 10 (ref 5–15)
BUN: 30 mg/dL — ABNORMAL HIGH (ref 8–23)
CO2: 27 mmol/L (ref 22–32)
Calcium: 8.4 mg/dL — ABNORMAL LOW (ref 8.9–10.3)
Chloride: 101 mmol/L (ref 98–111)
Creatinine, Ser: 7.08 mg/dL — ABNORMAL HIGH (ref 0.61–1.24)
GFR, Estimated: 8 mL/min — ABNORMAL LOW (ref >60)
Glucose, Bld: 116 mg/dL — ABNORMAL HIGH (ref 70–99)
Phosphorus: 2.9 mg/dL (ref 2.5–4.6)
Potassium: 4 mmol/L (ref 3.5–5.1)
Sodium: 138 mmol/L (ref 135–145)

## 2021-09-24 LAB — TYPE AND SCREEN
ABO/RH(D): A POS
Antibody Screen: NEGATIVE
Unit division: 0

## 2021-09-24 LAB — CBC
HCT: 24.7 % — ABNORMAL LOW (ref 39.0–52.0)
Hemoglobin: 8 g/dL — ABNORMAL LOW (ref 13.0–17.0)
MCH: 33.2 pg (ref 26.0–34.0)
MCHC: 32.4 g/dL (ref 30.0–36.0)
MCV: 102.5 fL — ABNORMAL HIGH (ref 80.0–100.0)
Platelets: 202 10*3/uL (ref 150–400)
RBC: 2.41 MIL/uL — ABNORMAL LOW (ref 4.22–5.81)
RDW: 17.4 % — ABNORMAL HIGH (ref 11.5–15.5)
WBC: 7.3 10*3/uL (ref 4.0–10.5)
nRBC: 3.6 % — ABNORMAL HIGH (ref 0.0–0.2)

## 2021-09-24 MED ORDER — LOPERAMIDE HCL 2 MG PO CAPS
2.0000 mg | ORAL_CAPSULE | ORAL | Status: DC | PRN
  Administered 2021-09-24 (×2): 2 mg via ORAL
  Filled 2021-09-24 (×2): qty 1

## 2021-09-24 NOTE — Progress Notes (Signed)
Orthopedic Tech Progress Note Patient Details:  Kerry Underwood 1951/08/24 [id number]  Pt chose to leave shoe on after fitting it for size. Emphasized using the call button if he felt he needed to get up for anything as he tried to stand after the darco shoe was put on. Explained how not being familiar with wearing the shoe and it's wedge shape may cause some instability at first and to wait to ambulate with help of mobility or OT. Pt expressed understanding of having assistance to ambulate as needed.   Ortho Devices Type of Ortho Device: Darco shoe Ortho Device/Splint Location: RLE Ortho Device/Splint Interventions: Application, Adjustment   Post Interventions Patient Tolerated: Well Instructions Provided: Poper ambulation with device, Adjustment of device  Derek Laughter Jeri Modena 09/24/2021, 12:56 PM

## 2021-09-24 NOTE — Progress Notes (Signed)
[facility] Progress Note   Subjective:  Seen in room - did ok overnight. No CP/dyspnea this morning. RLE pain was severe while on HD yesterday -> his RN ended up reducing BFR which he felt helped a lot.   Objective Vitals:   09/24/21 0109 09/24/21 0602 09/24/21 0759 09/24/21 0850  BP: 116/60 (!) 109/57 110/60   Pulse:  99 77   Resp: '14 17 16   '$ Temp: 99.1 F (37.3 C) 98.8 F (37.1 C) 98.3 F (36.8 C)   TempSrc: Oral Oral Oral   SpO2: 98% (!) 89% 91% 91%  Weight:      Height:       Physical Exam General: Chronically ill appearing man, NAD. Room air. Heart: RRR; no murmur Lungs: CTA anteriorly Abdomen: soft Extremities: No LE edema; RLE with long medial incision; foot with erythema and partial 2nd toe amputation Dialysis Access: TDC and RUE 1st stage BVT (maturing)  Additional Objective Labs: Basic Metabolic Panel: Recent Labs  Lab 09/21/21 0229 09/22/21 1044 09/22/21 1336 09/23/21 0426 09/24/21 0136  NA 134* 134*  --  134* 138  K 4.9 3.8  --  4.6 4.0  CL 98  --   --  99 101  CO2 22  --   --  21* 27  GLUCOSE 103*  --   --  124* 116*  BUN 58*  --   --  49* 30*  CREATININE 13.42*  --  9.81* 10.70* 7.08*  CALCIUM 8.3*  --   --  7.7* 8.4*  PHOS 7.3*  --   --  5.7* 2.9   Liver Function Tests: Recent Labs  Lab 09/18/21 0409 09/21/21 0229 09/23/21 0426 09/24/21 0136  AST 37  --   --   --   ALT 18  --   --   --   ALKPHOS 52  --   --   --   BILITOT 0.8  --   --   --   PROT 6.1*  --   --   --   ALBUMIN 2.9* 2.7* 2.5* 2.5*   CBC: Recent Labs  Lab 09/18/21 0409 09/20/21 1226 09/22/21 1044 09/22/21 1336 09/23/21 0427 09/23/21 1951 09/24/21 0136  WBC 6.6 6.6  --  8.9 7.1  --  7.3  HGB 8.2* 8.7*   < > 8.0* 6.8* 8.7* 8.0*  HCT 25.3* 26.5*   < > 24.4* 19.8* 26.1* 24.7*  MCV 105.0* 101.9*  --  103.0* 101.0*  --  102.5*  PLT 130* 164  --  182 171  --  202   < > = values in this interval not displayed.   Medications:  sodium chloride      sodium chloride     sodium chloride     magnesium sulfate bolus IVPB      (feeding supplement) PROSource Plus  30 mL Oral BID BM   aspirin EC  325 mg Oral QHS   atorvastatin  40 mg Oral Daily   calcitRIOL  0.25 mcg Oral Q T,Th,Sa-HD   calcium carbonate  500 mg Oral QHS   Chlorhexidine Gluconate Cloth  6 each Topical Q0600   clopidogrel  75 mg Oral Q breakfast   darbepoetin (ARANESP) injection - DIALYSIS  150 mcg Intravenous Q Thu-HD   docusate sodium  100 mg Oral Daily   fluticasone furoate-vilanterol  1 puff Inhalation Daily   heparin  5,000 Units Subcutaneous Q8H   metoprolol tartrate  25 mg Oral Q1400   multivitamin  1 tablet Oral QHS   mupirocin ointment  1 application Nasal BID   pantoprazole  40 mg Oral Daily   sucroferric oxyhydroxide  1,000 mg Oral TID WC   tamsulosin  0.4 mg Oral QHS   umeclidinium bromide  1 puff Inhalation Daily    TTS at [facility] EDW 83.5kg HD Bath 2K/2.5Ca  Time 4:15 Heparin none. Access RIJ TDC BFR 400 DFR 500    Calcitriol 0.25 mcg po/HD mircera 30 q4 - last Hb 10.4, hasn't been dosed yet.    Assessment/Plan: 1. R foot cellulitis/critical limb ischemia: S/p R fem-pop bypass, R femoral endarterectomy, R 2nd toe partial amputation 10/14. On Vanc/Ceftriaxone/Flagyl. 2. ESRD: Continue HD on TTS schedule -> next HD 10/18. Will inform outpatient HD unit to do slightly lower BFR temporarily to help with leg pain on dialysis until he is healed. 3. HTN/volume:  BP stable, continue home meds and UF with next HD (slightly below dry weight today) 4. Anemia: Hgb 8 today, s/p 1U PRBCs on 10/15. Continue Aranesp 122mg q Thursday 5. Secondary hyperparathyroidism:  Ca/Phos good - continue home binders and calcitriol. Ordered for Tums QHS - will d/c this. 6. Nutrition: Alb low, continue supplements.  KVeneta Penton PA-C 09/24/2021, 9:20 AM  [employer]

## 2021-09-24 NOTE — Progress Notes (Addendum)
  Progress Note    09/24/2021 8:16 AM 2 Days Post-Op  Subjective:  wants some salve for his toe incision.  Tm 99.1 now afebrile HR 70's-80's NSR 0000000 systolic 0000000 RA  Vitals:   09/24/21 0602 09/24/21 0759  BP: (!) 109/57 110/60  Pulse: 99 77  Resp: 17 16  Temp: 98.8 F (37.1 C) 98.3 F (36.8 C)  SpO2: (!) 89% 91%    Physical Exam: Cardiac:  regular Lungs:  non labored Incisions:  all incisions look good Extremities:  brisk doppler signals right DP/PT/peroneal   CBC    Component Value Date/Time   WBC 7.3 09/24/2021 0136   RBC 2.41 (L) 09/24/2021 0136   HGB 8.0 (L) 09/24/2021 0136   HCT 24.7 (L) 09/24/2021 0136   HCT 26.1 (L) 04/16/2020 1052   PLT 202 09/24/2021 0136   MCV 102.5 (H) 09/24/2021 0136   MCH 33.2 09/24/2021 0136   MCHC 32.4 09/24/2021 0136   RDW 17.4 (H) 09/24/2021 0136   LYMPHSABS 0.9 09/15/2021 1504   MONOABS 0.6 09/15/2021 1504   EOSABS 0.3 09/15/2021 1504   BASOSABS 0.0 09/15/2021 1504    BMET    Component Value Date/Time   NA 138 09/24/2021 0136   NA 140 08/23/2021 1208   K 4.0 09/24/2021 0136   CL 101 09/24/2021 0136   CO2 27 09/24/2021 0136   GLUCOSE 116 (H) 09/24/2021 0136   BUN 30 (H) 09/24/2021 0136   BUN 39 (H) 08/23/2021 1208   CREATININE 7.08 (H) 09/24/2021 0136   CALCIUM 8.4 (L) 09/24/2021 0136   GFRNONAA 8 (L) 09/24/2021 0136   GFRAA 6 (L) 04/20/2020 0545    INR No results found for: INR   Intake/Output Summary (Last 24 hours) at 09/24/2021 0816 Last data filed at 09/23/2021 1530 Gross per 24 hour  Intake 420 ml  Output 1500 ml  Net -1080 ml     Assessment/Plan:  70 y.o. male is s/p:  right common femoral endarterectomy with a right common femoral to below-knee pop bypass with great saphenous vein for CLI with tissue loss.  We also did a partial amputation of the right second toe.  2 Days Post-Op   -pt with brisk doppler signals right DP/PT/peroneal  -incisions are healing nicely -acute blood  loss anemia improved with PRBC's -continue to mobilize -keep toe wound dry-pt requesting "salve" for incision.  -DVT prophylaxis:  sq heparin -TRH MD most likely dc pt home tomorrow.  Pt will f/u with [facility] in 2-3 weeks.  Our office will make the appt.  -continue asa/statin/plavix   Leontine Locket, PA-C [facility] [phone] 09/24/2021 8:16 AM  I have seen and evaluated the patient. I agree with the PA note as documented above.  Postop day 2 status post right common femoral endarterectomy with a right common femoral to below-knee pop bypass with vein and partial toe amputation of the right second toe.  Excellent Doppler signals in the right foot.  Incisions are clean dry and intact.  He has some blistering and discoloration of the partial toe amputation site that discussed we would watch in the weeks ahead to allow demarcation.  Tentative discharge tomorrow which is fine by me.  Will need short order follow-up in our office  Marty Heck, MD [facility] [place] Office: [phone]

## 2021-09-24 NOTE — Evaluation (Signed)
Occupational Therapy Evaluation Patient Details Name: Kerry Underwood MRN: [id number] DOB: 03-28-1951 Today's Date: 09/24/2021   History of Present Illness The pt is a 70 yo male presenting 10/07 with R foot pain, found to have critical limb ischemia (LLE > RLE). Now s/p R common fem endarterectomy with fem-pop bypass and R second toe amputation on 10/14.   Clinical Impression   Pt admitted for concerns and procedure listed above. 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 At this time, pt requires use of RW to ensure safety and PWB of his RLE. All ADL's he continues to be able to complete with mod I and compensatory strategies for safety. He has no further acute OT needs at this time and OT will sign off.       Recommendations for follow up therapy are one component of a multi-disciplinary discharge planning process, led by the attending physician.  Recommendations may be updated based on patient status, additional functional criteria and insurance authorization.   Follow Up Recommendations  No OT follow up    Equipment Recommendations  Other (comment) (RW)    Recommendations for Other Services       Precautions / Restrictions Precautions Precautions: Fall Restrictions Weight Bearing Restrictions: Yes RLE Weight Bearing: Partial weight bearing RLE Partial Weight Bearing Percentage or Pounds: Heel only Other Position/Activity Restrictions: encouraged wb through heel only due to 2nd toe amputation, no official orders      Mobility Bed Mobility Overal bed mobility: Modified Independent             General bed mobility comments: slight useof bed rail, no assist given    Transfers Overall transfer level: Needs assistance Equipment used: Rolling walker (2 wheeled) Transfers: Sit to/from Stand Sit to Stand: Supervision         General transfer comment: sup for safety    Balance Overall balance  assessment: Mild deficits observed, not formally tested                                         ADL either performed or assessed with clinical judgement   ADL Overall ADL's : Modified independent                                       General ADL Comments: Pt is able to use RW and complete all ADL's at a mod I level with no physical assist.     Vision Baseline Vision/History: 0 No visual deficits Ability to See in Adequate Light: 0 Adequate Patient Visual Report: No change from baseline Vision Assessment?: No apparent visual deficits     Perception Perception Perception Tested?: No   Praxis Praxis Praxis tested?: Not tested    Pertinent Vitals/Pain Pain Assessment: No/denies pain     Hand Dominance Right   Extremity/Trunk Assessment Upper Extremity Assessment Upper Extremity Assessment: Overall WFL for tasks assessed   Lower Extremity Assessment Lower Extremity Assessment: Defer to PT evaluation   Cervical / Trunk Assessment Cervical / Trunk Assessment: Normal   Communication Communication Communication: No difficulties   Cognition Arousal/Alertness: Awake/alert Behavior During Therapy: WFL for tasks assessed/performed;Impulsive  General Comments: Pt needs cuing for safety at times, as he is mildly impulsive.   General Comments  VSS on  RA, L foot swollen and 2nd toe black.    Exercises     Shoulder Instructions      Home Living Family/patient expects to be discharged to:: Private residence Living Arrangements: Alone   Type of Home: House Home Access: Stairs to enter [employer] of Steps: 2   Home Layout: One level     Bathroom Shower/Tub: Teacher, early years/pre: Standard     Home Equipment: None   Additional Comments: pt has not been using shower for 1 year, washup at sink only. 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 OT Goal Formulation: All assessment and education complete, DC therapy Time For Goal Achievement: 09/24/21 Potential to Achieve Goals: Good  OT Frequency:     Barriers to D/C:            Co-evaluation              AM-PAC OT "6 Clicks" Daily Activity     Outcome Measure Help from another person eating meals?: None Help from another person taking care of personal grooming?: None Help from another person toileting, which includes using toliet, bedpan, or urinal?: None Help from another person bathing (including washing, rinsing, drying)?: None Help from another person to put on and taking off regular upper body clothing?: None Help from another person to put on and taking off regular lower body clothing?: None 6 Click Score: 24   End of Session Equipment Utilized During Treatment: Gait belt;Rolling walker Nurse Communication: Mobility status  Activity Tolerance: Patient tolerated treatment well Patient left: in bed;with call bell/phone within reach  OT Visit Diagnosis: Unsteadiness on feet (R26.81);Other abnormalities of gait and mobility (R26.89);Muscle weakness (generalized) (M62.81)                Time: [id number] OT Time Calculation (min): 16 min Charges:  OT General Charges $OT Visit: 1 Visit OT Evaluation $OT Eval Low Complexity: [place]., OTR/L Acute Rehabilitation  Malaka Ruffner Elane Yolanda Bonine 09/24/2021, 7:14 PM

## 2021-09-24 NOTE — Progress Notes (Signed)
Mobility Specialist Progress Note:   09/24/21 1400  Therapy Vitals  Pulse Rate 82  Mobility  Activity Ambulated in hall  Level of Assistance Contact guard assist, steadying assist  Assistive Device Front wheel walker  Distance Ambulated (ft) 320 ft  Mobility Ambulated with assistance in hallway  Mobility Response Tolerated well  Mobility performed by Mobility specialist  $Mobility charge 1 Mobility   Pre Mobility: HR 82 bpm During Mobility: HR 125 bpm Post Mobility: HR 94 bpm  Pt received in bed, agreed to mobility. Ambulated in hallway 320' with RW. Required contact G d/t getting used to [place]. Pt stated his calf was cramping d/t ambulating in the darco shoe. Left sitting EOB with all needs met.   Nelta Numbers Mobility Specialist  Phone [phone]

## 2021-09-24 NOTE — Progress Notes (Signed)
PROGRESS NOTE        PATIENT DETAILS Name: Kerry Underwood Age: 70 y.o. Sex: male Date of Birth: Dec 28, 1950 Admit Date: 09/15/2021 Admitting Physician Orson Eva, MD CC:[id number], Shireen Quan, FNP  Brief Narrative: Patient is a 70 y.o. male with history of ESRD on HD, HTN, COPD who presented with right foot pain/swelling for several weeks-with blackish discoloration of his right second toe.  He was initially evaluated at [facility] transferred to [facility] for vascular surgery evaluation.  Subjective: Some mild soreness at the operative site-no chest pain or shortness of breath.  Objective: Vitals: Blood pressure (!) 116/59, pulse 78, temperature 98.6 F (37 C), temperature source Oral, resp. rate 17, height '5\' 5"'$  (1.651 m), weight 82.6 kg, SpO2 92 %.   Exam: Gen Exam:Alert awake-not in any distress HEENT:atraumatic, normocephalic Chest: B/L clear to auscultation anteriorly CVS:S1S2 regular Abdomen:soft non tender, non distended Extremities: Significant decrease in erythema/swelling of the forefoot-some mild blackish discoloration of the righ second  toe amputation site. Neurology: Non focal Skin: no rash   Pertinent Labs/Radiology: WBC: 7.1 Hb: 6.8 Na: 134  K: 4.6    10/7>>Blood culture: No growth 10/7>> x-ray right foot: Soft tissue swelling of the foot-no osseous abnormality.  Assessment/Plan: Right foot soft tissue infection with critical limb ischemia: S/p arteriogram on 10/12 which showed severe PAD-subsequently underwent revascularization and partial amputation of the second toe on 10/14.  Discussed with vascular surgery-no further antibiotics required-some mild blackish discoloration of the second toe amputation site-vascular surgery recommending observation for now.   PAD: Vascular surgery following s/p right femoral endarterectomy and femoral to below-knee popliteal bypass on 10/14-continue on aspirin/Plavix-and statin (LDL 90 on 9/14)  ESRD: On HD  TTS-nephrology following-defer HD to nephrology.  Macrocytic anemia: Multifactorial-due to some perioperative blood loss anemia/anemia of chronic disease-required 1 unit of PRBC on 10/15-hemoglobin currently stable.  Continue to follow CBC periodically.   Folate/vitamin B12 levels stable.    HTN: BP stable-continue metoprolol  COPD: Not in exacerbation-continue bronchodilators  BPH: Continue Flomax  Tobacco abuse: Counseled  Obesity: Estimated body mass index is 30.3 kg/m as calculated from the following:   Height as of this encounter: '5\' 5"'$  (1.651 m).   Weight as of this encounter: 82.6 kg.    Procedures:  10/12>> lower extremity arteriogram 10/14>> partial right second toe amputation, right common femoral artery endarterectomy, right common femoral to below knee popliteal artery bypass.  Consults: Vascular surgery, nephrology DVT Prophylaxis: Heparin Code Status:Full code  Family Communication: None at bedside  Time spent: 25 minutes-Greater than 50% of this time was spent in counseling, explanation of diagnosis, planning of further management, and coordination of care.  Diet: Diet Order             Diet renal with fluid restriction Fluid restriction: 1500 mL Fluid; Room service appropriate? Yes with Assist; Fluid consistency: Thin  Diet effective now                      Disposition Plan: Status is: Inpatient  Remains inpatient appropriate because:Inpatient level of care appropriate due to severity of illness  Dispo: The patient is from: Home              Anticipated d/c is to: Home              Patient currently is not medically  stable to d/c.   Difficult to place patient No     Barriers to Discharge: Left lower extremity cellulitis-critical limb ischemia-s/p right second toe amputation and revascularization-if he continues to do well with appropriate pain control-probably discharge on 10/17.  Antimicrobial agents: Anti-infectives (From admission,  onward)    Start     Dose/Rate Route Frequency Ordered Stop   09/23/21 0900  vancomycin (VANCOCIN) IVPB 1000 mg/200 mL premix  Status:  Discontinued        1,000 mg 200 mL/hr over 60 Minutes Intravenous  Once 09/23/21 0852 09/23/21 0859   09/22/21 1415  ceFAZolin (ANCEF) IVPB 2g/100 mL premix  Status:  Discontinued        2 g 200 mL/hr over 30 Minutes Intravenous Every 8 hours 09/22/21 1323 09/22/21 1327   09/21/21 0925  vancomycin variable dose per unstable renal function (pharmacist dosing)  Status:  Discontinued         Does not apply See admin instructions 09/21/21 0925 09/23/21 0859   09/20/21 1345  vancomycin (VANCOCIN) IVPB 1000 mg/200 mL premix        1,000 mg 200 mL/hr over 60 Minutes Intravenous  Once 09/20/21 1248 09/21/21 0700   09/19/21 1200  vancomycin (VANCOCIN) IVPB 1000 mg/200 mL premix  Status:  Discontinued        1,000 mg 200 mL/hr over 60 Minutes Intravenous Every T-Th-Sa (Hemodialysis) 09/18/21 1237 09/20/21 1247   09/17/21 1200  vancomycin (VANCOREADY) IVPB 1500 mg/300 mL        1,500 mg 150 mL/hr over 120 Minutes Intravenous  Once 09/17/21 1106 09/17/21 1422   09/16/21 1000  cefTRIAXone (ROCEPHIN) 2 g in sodium chloride 0.9 % 100 mL IVPB  Status:  Discontinued       See Hyperspace for full Linked Orders Report.   2 g 200 mL/hr over 30 Minutes Intravenous Every 24 hours 09/15/21 1916 09/18/21 0856   09/15/21 1916  metroNIDAZOLE (FLAGYL) IVPB 500 mg  Status:  Discontinued       See Hyperspace for full Linked Orders Report.   500 mg 100 mL/hr over 60 Minutes Intravenous Every 8 hours 09/15/21 1916 09/18/21 0856   09/15/21 1800  vancomycin (VANCOCIN) IVPB 1000 mg/200 mL premix        1,000 mg 200 mL/hr over 60 Minutes Intravenous  Once 09/15/21 1751 09/15/21 1917   09/15/21 1800  ceFEPIme (MAXIPIME) 2 g in sodium chloride 0.9 % 100 mL IVPB        2 g 200 mL/hr over 30 Minutes Intravenous  Once 09/15/21 1751 09/15/21 1817        MEDICATIONS: Scheduled  Meds:  (feeding supplement) PROSource Plus  30 mL Oral BID BM   aspirin EC  325 mg Oral QHS   atorvastatin  40 mg Oral Daily   calcitRIOL  0.25 mcg Oral Q T,Th,Sa-HD   Chlorhexidine Gluconate Cloth  6 each Topical Q0600   clopidogrel  75 mg Oral Q breakfast   darbepoetin (ARANESP) injection - DIALYSIS  150 mcg Intravenous Q Thu-HD   docusate sodium  100 mg Oral Daily   fluticasone furoate-vilanterol  1 puff Inhalation Daily   heparin  5,000 Units Subcutaneous Q8H   metoprolol tartrate  25 mg Oral Q1400   multivitamin  1 tablet Oral QHS   mupirocin ointment  1 application Nasal BID   pantoprazole  40 mg Oral Daily   sucroferric oxyhydroxide  1,000 mg Oral TID WC   tamsulosin  0.4 mg Oral QHS  umeclidinium bromide  1 puff Inhalation Daily   Continuous Infusions:  sodium chloride     sodium chloride     sodium chloride     magnesium sulfate bolus IVPB     PRN Meds:.sodium chloride, sodium chloride, sodium chloride, acetaminophen **OR** acetaminophen, alteplase, guaiFENesin-dextromethorphan, heparin, hydrALAZINE, HYDROmorphone (DILAUDID) injection, labetalol, magnesium sulfate bolus IVPB, metoprolol tartrate, ondansetron **OR** [DISCONTINUED] ondansetron (ZOFRAN) IV, oxyCODONE   I have personally reviewed following labs and imaging studies  LABORATORY DATA: CBC: Recent Labs  Lab 09/18/21 0409 09/20/21 1226 09/22/21 1044 09/22/21 1336 09/23/21 0427 09/23/21 1951 09/24/21 0136  WBC 6.6 6.6  --  8.9 7.1  --  7.3  HGB 8.2* 8.7* 8.8* 8.0* 6.8* 8.7* 8.0*  HCT 25.3* 26.5* 26.0* 24.4* 19.8* 26.1* 24.7*  MCV 105.0* 101.9*  --  103.0* 101.0*  --  102.5*  PLT 130* 164  --  182 171  --  202     Basic Metabolic Panel: Recent Labs  Lab 09/18/21 0409 09/20/21 1226 09/21/21 0229 09/22/21 1044 09/22/21 1336 09/23/21 0426 09/24/21 0136  NA 135  --  134* 134*  --  134* 138  K 4.0  --  4.9 3.8  --  4.6 4.0  CL 98  --  98  --   --  99 101  CO2 25  --  22  --   --  21* 27   GLUCOSE 76  --  103*  --   --  124* 116*  BUN 35*  --  58*  --   --  49* 30*  CREATININE 8.78* 12.90* 13.42*  --  9.81* 10.70* 7.08*  CALCIUM 8.4*  --  8.3*  --   --  7.7* 8.4*  PHOS  --   --  7.3*  --   --  5.7* 2.9     GFR: Estimated Creatinine Clearance: 9.3 mL/min (A) (by C-G formula based on SCr of 7.08 mg/dL (H)).  Liver Function Tests: Recent Labs  Lab 09/18/21 0409 09/21/21 0229 09/23/21 0426 09/24/21 0136  AST 37  --   --   --   ALT 18  --   --   --   ALKPHOS 52  --   --   --   BILITOT 0.8  --   --   --   PROT 6.1*  --   --   --   ALBUMIN 2.9* 2.7* 2.5* 2.5*    No results for input(s): LIPASE, AMYLASE in the last 168 hours. No results for input(s): AMMONIA in the last 168 hours.  Coagulation Profile: No results for input(s): INR, PROTIME in the last 168 hours.  Cardiac Enzymes: No results for input(s): CKTOTAL, CKMB, CKMBINDEX, TROPONINI in the last 168 hours.  BNP (last 3 results) No results for input(s): PROBNP in the last 8760 hours.  Lipid Profile: Recent Labs    09/23/21 0427  CHOL 91  HDL 19*  LDLCALC 54  TRIG 91  CHOLHDL 4.8     Thyroid Function Tests: No results for input(s): TSH, T4TOTAL, FREET4, T3FREE, THYROIDAB in the last 72 hours.  Anemia Panel: No results for input(s): VITAMINB12, FOLATE, FERRITIN, TIBC, IRON, RETICCTPCT in the last 72 hours.   Urine analysis:    Component Value Date/Time   COLORURINE YELLOW 04/15/2020 2200   APPEARANCEUR TURBID (A) 04/15/2020 2200   LABSPEC 1.010 04/15/2020 2200   PHURINE 6.0 04/15/2020 2200   GLUCOSEU NEGATIVE 04/15/2020 2200   HGBUR SMALL (A) 04/15/2020 2200  BILIRUBINUR NEGATIVE 04/15/2020 2200   KETONESUR NEGATIVE 04/15/2020 2200   PROTEINUR 100 (A) 04/15/2020 2200   NITRITE POSITIVE (A) 04/15/2020 2200   LEUKOCYTESUR LARGE (A) 04/15/2020 2200    Sepsis Labs: Lactic Acid, Venous    Component Value Date/Time   LATICACIDVEN 1.2 09/15/2021 1802    MICROBIOLOGY: Recent  Results (from the past 240 hour(s))  Culture, blood (routine x 2)     Status: None   Collection Time: 09/15/21  2:56 PM   Specimen: Left Antecubital; Blood  Result Value Ref Range Status   Specimen Description LEFT ANTECUBITAL  Final   Special Requests   Final    Blood Culture adequate volume BOTTLES DRAWN AEROBIC AND ANAEROBIC   Culture   Final    NO GROWTH 5 DAYS Performed at [facility], [address], [place], [place] [address]    Report Status 09/20/2021 FINAL  Final  Culture, blood (routine x 2)     Status: None   Collection Time: 09/15/21  3:04 PM   Specimen: BLOOD LEFT HAND  Result Value Ref Range Status   Specimen Description BLOOD LEFT HAND  Final   Special Requests   Final    Blood Culture results may not be optimal due to an excessive volume of blood received in culture bottles BOTTLES DRAWN AEROBIC AND ANAEROBIC   Culture   Final    NO GROWTH 5 DAYS Performed at [facility], [address], [place], [place] [address]    Report Status 09/20/2021 FINAL  Final  Resp Panel by RT-PCR (Flu A&B, Covid) Nasopharyngeal Swab     Status: None   Collection Time: 09/15/21  5:54 PM   Specimen: Nasopharyngeal Swab; Nasopharyngeal(NP) swabs in vial transport medium  Result Value Ref Range Status   SARS Coronavirus 2 by RT PCR NEGATIVE NEGATIVE Final    Comment: (NOTE) SARS-CoV-2 target nucleic acids are NOT DETECTED.  The SARS-CoV-2 RNA is generally detectable in upper respiratory specimens during the acute phase of infection. The lowest concentration of SARS-CoV-2 viral copies this assay can detect is 138 copies/mL. A negative result does not preclude SARS-Cov-2 infection and should not be used as the sole basis for treatment or other patient management decisions. A negative result may occur with  improper specimen collection/handling, submission of specimen other than nasopharyngeal swab, presence of viral mutation(s) within the areas targeted by this assay, and inadequate  number of viral copies(<138 copies/mL). A negative result must be combined with clinical observations, patient history, and epidemiological information. The expected result is Negative.  Fact Sheet for Patients:  EntrepreneurPulse.com.au  Fact Sheet for Healthcare Providers:  IncredibleEmployment.be  This test is no t yet approved or cleared by the [place] FDA and  has been authorized for detection and/or diagnosis of SARS-CoV-2 by FDA under an Emergency Use Authorization (EUA). This EUA will remain  in effect (meaning this test can be used) for the duration of the COVID-19 declaration under Section 564(b)(1) of the Act, 21 U.S.C.section 360bbb-3(b)(1), unless the authorization is terminated  or revoked sooner.       Influenza A by PCR NEGATIVE NEGATIVE Final   Influenza B by PCR NEGATIVE NEGATIVE Final    Comment: (NOTE) The Xpert Xpress SARS-CoV-2/FLU/RSV plus assay is intended as an aid in the diagnosis of influenza from Nasopharyngeal swab specimens and should not be used as a sole basis for treatment. Nasal washings and aspirates are unacceptable for Xpert Xpress SARS-CoV-2/FLU/RSV testing.  Fact Sheet for Patients: EntrepreneurPulse.com.au  Fact  Sheet for Healthcare Providers: IncredibleEmployment.be  This test is not yet approved or cleared by the [place] and has been authorized for detection and/or diagnosis of SARS-CoV-2 by FDA under an Emergency Use Authorization (EUA). This EUA will remain in effect (meaning this test can be used) for the duration of the COVID-19 declaration under Section 564(b)(1) of the Act, 21 U.S.C. section 360bbb-3(b)(1), unless the authorization is terminated or revoked.  Performed at [facility], [address] [place], [place] [address]   MRSA Next Gen by PCR, Nasal     Status: Abnormal   Collection Time: 09/22/21  3:47 AM  Result Value Ref Range  Status   MRSA by PCR Next Gen MRSA DETECTED (A) NOT DETECTED Final    Comment: RESULT CALLED TO, READ BACK BY AND VERIFIED WITH: T IRBY,RN'@[id number]'$  09/22/21 [place] Performed at [facility], [place] [address], [place], [place] [address]     RADIOLOGY STUDIES/RESULTS: No results found.   LOS: 9 days   Oren Binet, MD  [facility]    To contact the attending provider between 7A-7P or the covering provider during after hours 7P-7A, please log into the web site [email or web address] and access using universal [place] password for that web site. If you do not have the password, please call the hospital operator.  09/24/2021, 11:46 AM

## 2021-09-25 ENCOUNTER — Other Ambulatory Visit (HOSPITAL_COMMUNITY): Payer: Self-pay

## 2021-09-25 ENCOUNTER — Encounter (HOSPITAL_COMMUNITY): Payer: Self-pay | Admitting: Vascular Surgery

## 2021-09-25 DIAGNOSIS — L03115 Cellulitis of right lower limb: Secondary | ICD-10-CM | POA: Diagnosis not present

## 2021-09-25 DIAGNOSIS — I739 Peripheral vascular disease, unspecified: Secondary | ICD-10-CM | POA: Insufficient documentation

## 2021-09-25 DIAGNOSIS — J449 Chronic obstructive pulmonary disease, unspecified: Secondary | ICD-10-CM | POA: Diagnosis not present

## 2021-09-25 DIAGNOSIS — N186 End stage renal disease: Secondary | ICD-10-CM | POA: Diagnosis not present

## 2021-09-25 MED ORDER — CALCITRIOL 0.25 MCG PO CAPS: 0.2500 ug | ORAL_CAPSULE | ORAL | 0 refills | Status: DC

## 2021-09-25 MED ORDER — CLOPIDOGREL BISULFATE 75 MG PO TABS
75.0000 mg | ORAL_TABLET | Freq: Every day | ORAL | 1 refills | Status: DC
  Filled 2021-09-25: qty 30, 30d supply, fill #0

## 2021-09-25 MED ORDER — CALCITRIOL 0.25 MCG PO CAPS
0.2500 ug | ORAL_CAPSULE | ORAL | 0 refills | Status: DC
  Filled 2021-09-25: qty 90, 210d supply, fill #0

## 2021-09-25 MED ORDER — METOPROLOL TARTRATE 25 MG PO TABS: 25.0000 mg | ORAL_TABLET | Freq: Every day | ORAL | Status: DC

## 2021-09-25 MED ORDER — PANTOPRAZOLE SODIUM 40 MG PO TBEC
40.0000 mg | DELAYED_RELEASE_TABLET | Freq: Every day | ORAL | 0 refills | Status: DC
  Filled 2021-09-25: qty 30, 30d supply, fill #0

## 2021-09-25 MED ORDER — ATORVASTATIN CALCIUM 40 MG PO TABS
40.0000 mg | ORAL_TABLET | Freq: Every day | ORAL | 1 refills | Status: DC
  Filled 2021-09-25: qty 30, 30d supply, fill #0

## 2021-09-25 MED ORDER — METOPROLOL TARTRATE 25 MG PO TABS: 25.0000 mg | ORAL_TABLET | Freq: Two times a day (BID) | ORAL | 0 refills | Status: DC

## 2021-09-25 MED ORDER — OXYCODONE-ACETAMINOPHEN 5-325 MG PO TABS: 1.0000 | ORAL_TABLET | Freq: Four times a day (QID) | ORAL | 0 refills | Status: DC | PRN

## 2021-09-25 NOTE — TOC Transition Note (Signed)
Transition of Care (TOC) - CM/SW Discharge Note Marvetta Gibbons RN, BSN Transitions of Care Unit 4E- RN Case Manager See Treatment Team for direct phone #    Patient Details  Name: Kerry Underwood MRN: [id number] Date of Birth: 08-May-1951  Transition of Care [facility]) CM/SW Contact:  Dawayne Patricia, RN Phone Number: 09/25/2021, 1:18 PM   Clinical Narrative:    Pt stable for transition home today, orders placed for HHPT/OT. CM in to speak with pt at bedside- pt OOB to chair.  Per conversation with pt about [facility] services- pt states he does not want them, feels he will do fine without them. Discussed DME- pt states "I don't have any money"- explained insurance usually covers and if anything he may have a small copay. - pt voices that he wants to wait. States he has a cane and walking stick at home that he can use- and he will get RW if needed later.   Pt received call that his ride was here to transport him home while this writer was in the room. Pt dressed and bags packed. Pt voiced again that he was declining any HH and DME at this time. Unit secretary notified to call volunteers to transport pt to ride.    Final next level of care: Home/Self Care Barriers to Discharge: Barriers Resolved   Patient Goals and CMS Choice Patient states their goals for this hospitalization and ongoing recovery are:: Return home CMS Medicare.gov Compare Post Acute Care list provided to:: Patient Choice offered to / list presented to : Patient  Discharge Placement                 Home      Discharge Plan and Services In-house Referral: Clinical Social Work Discharge Planning Services: CM Consult Post Acute Care Choice: Durable Medical Equipment, Home Health          DME Arranged: Patient refused services         HH Arranged: Patient Refused HH, PT, OT          Social Determinants of Health (SDOH) Interventions     Readmission Risk Interventions Readmission Risk Prevention Plan  09/18/2021  Transportation Screening Complete  HRI or [place] Complete  Social Work Consult for [place] Planning/Counseling [place] Not Applicable  Medication Review Press photographer) Complete  Some recent data might be hidden

## 2021-09-25 NOTE — Discharge Summary (Signed)
PATIENT DETAILS Name: Kerry Underwood Age: 70 y.o. Sex: male Date of Birth: 1951-06-15 MRN: [id number]. Admitting Physician: Orson Eva, MD [id number], Shireen Quan, FNP  Admit Date: 09/15/2021 Discharge date: 09/25/2021  Recommendations for Outpatient Follow-up:  Follow up with PCP in 1-2 weeks Please obtain CMP/CBC in one week  Admitted From:  Home  Disposition: Home with home health [place]: Yes  Equipment/Devices: None  Discharge Condition: Stable  CODE STATUS: DNR  Diet recommendation:  Diet Order             Diet - low sodium heart healthy           Diet renal with fluid restriction Fluid restriction: 1500 mL Fluid; Room service appropriate? Yes with Assist; Fluid consistency: Thin  Diet effective now                    Brief Summary: Patient is a 70 y.o. male with history of ESRD on HD, HTN, COPD who presented with right foot pain/swelling for several weeks-with blackish discoloration of his right second toe.  He was initially evaluated at [facility] transferred to [facility] for vascular surgery evaluation.  Brief Hospital Course: Right foot soft tissue infection with critical limb ischemia: S/p arteriogram on 10/12 which showed severe PAD-subsequently underwent revascularization and partial amputation of the second toe on 10/14.  Discussed with vascular surgery-no further antibiotics required-some mild blackish discoloration of the second toe amputation site-vascular surgery recommending observation for now.    PAD: Vascular surgery following s/p right femoral endarterectomy and femoral to below-knee popliteal bypass on 10/14-continue on aspirin/Plavix-and statin (LDL 90 on 9/14)   ESRD: On HD TTS-nephrology followed closely during this hospitalization-resume usual outpatient HD regimen on discharge.   Macrocytic anemia: Multifactorial-due to some perioperative blood loss anemia/anemia of chronic disease-required 1 unit of PRBC on 10/15-hemoglobin  currently stable.  Continue to follow CBC periodically.   Folate/vitamin B12 levels stable.     HTN: BP stable-continue metoprolol   COPD: Not in exacerbation-continue bronchodilators   BPH: Continue Flomax   Tobacco abuse: Counseled   Obesity: Estimated body mass index is 30.3 kg/m as calculated from the following:   Height as of this encounter: '5\' 5"'$  (1.651 m).   Weight as of this encounter: 82.6 kg.     Procedures 10/12>> lower extremity arteriogram 10/14>> partial right second toe amputation, right common femoral artery endarterectomy, right common femoral to below knee popliteal artery bypass.  Discharge Diagnoses:  Principal Problem:   Cellulitis of right foot Active Problems:   Chronic obstructive pulmonary disease ([place])   Hypertension secondary to other renal disorders   ESRD (end stage renal disease) (HCC)   Chronic diastolic heart failure (HCC)   PAOD (peripheral arterial occlusive disease) ([place])   Discharge Instructions:  Activity:  As tolerated with Full fall precautions use walker/cane & assistance as needed  Discharge Instructions     Diet - low sodium heart healthy   Complete by: As directed    Discharge instructions   Complete by: As directed    Follow with Primary MD  [name], FNP in 1-2 weeks  Follow-up with vascular surgery as instructed.  Please get a complete blood count and chemistry panel checked by your Primary MD at your next visit, and again as instructed by your Primary MD.  Get Medicines reviewed and adjusted: Please take all your medications with you for your next visit with your Primary MD  Laboratory/radiological data: Please request your  Primary MD to go over all hospital tests and procedure/radiological results at the follow up, please ask your Primary MD to get all Hospital records sent to his/her office.  In some cases, they will be blood work, cultures and biopsy results pending at the time of your discharge. Please  request that your primary care M.D. follows up on these results.  Also Note the following: If you experience worsening of your admission symptoms, develop shortness of breath, life threatening emergency, suicidal or homicidal thoughts you must seek medical attention immediately by calling 911 or calling your MD immediately  if symptoms less severe.  You must read complete instructions/literature along with all the possible adverse reactions/side effects for all the Medicines you take and that have been prescribed to you. Take any new Medicines after you have completely understood and accpet all the possible adverse reactions/side effects.   Do not drive when taking Pain medications or sleeping medications (Benzodaizepines)  Do not take more than prescribed Pain, Sleep and Anxiety Medications. It is not advisable to combine anxiety,sleep and pain medications without talking with your primary care practitioner  Special Instructions: If you have smoked or chewed Tobacco  in the last 2 yrs please stop smoking, stop any regular Alcohol  and or any Recreational drug use.  Wear Seat belts while driving.  Please note: You were cared for by a hospitalist during your hospital stay. Once you are discharged, your primary care physician will handle any further medical issues. Please note that NO REFILLS for any discharge medications will be authorized once you are discharged, as it is imperative that you return to your primary care physician (or establish a relationship with a primary care physician if you do not have one) for your post hospital discharge needs so that they can reassess your need for medications and monitor your lab values.   Increase activity slowly   Complete by: As directed    No dressing needed   Complete by: As directed       Allergies as of 09/25/2021       Reactions   Strawberry (diagnostic)    Strawberry Extract    Other reaction(s): Unknown        Medication List      STOP taking these medications    clindamycin 300 MG capsule Commonly known as: CLEOCIN   HYDROcodone-acetaminophen 5-325 MG tablet Commonly known as: NORCO/VICODIN       TAKE these medications    albuterol 108 (90 Base) MCG/ACT inhaler Commonly known as: VENTOLIN HFA Inhale 2 puffs into the lungs every 6 (six) hours as needed for wheezing or shortness of breath.   aspirin EC 325 MG tablet Take 325 mg by mouth at bedtime.   atorvastatin 40 MG tablet Commonly known as: LIPITOR Take 1 tablet (40 mg total) by mouth daily. Start taking on: September 26, 2021   Calcium Carbonate 500 MG Chew Chew 500 mg by mouth at bedtime.   clopidogrel 75 MG tablet Commonly known as: PLAVIX Take 1 tablet (75 mg total) by mouth daily with breakfast. Start taking on: September 26, 2021   DIALYVITE 800 PO Take 0.8 mg by mouth at bedtime.   fluticasone furoate-vilanterol 200-25 MCG/INH Aepb Commonly known as: Breo Ellipta Inhale 1 puff into the lungs daily.   metoprolol tartrate 25 MG tablet Commonly known as: LOPRESSOR Take 1 tablet (25 mg total) by mouth 2 (two) times daily. What changed: when to take this   oxyCODONE-acetaminophen 5-325 MG tablet Commonly  known as: Percocet Take 1 tablet by mouth every 6 (six) hours as needed.   pantoprazole 40 MG tablet Commonly known as: PROTONIX Take 1 tablet (40 mg total) by mouth daily. Start taking on: September 26, 2021   Spiriva HandiHaler 18 MCG inhalation capsule Generic drug: tiotropium Place 1 capsule (18 mcg total) into inhaler and inhale daily.   sucroferric oxyhydroxide 500 MG chewable tablet Commonly known as: VELPHORO Chew 1,000 mg by mouth 3 (three) times daily with meals.   tamsulosin 0.4 MG Caps capsule Commonly known as: FLOMAX Take 0.4 mg by mouth at bedtime.   VITAMIN D3 PO Take by mouth daily.               Discharge Care Instructions  (From admission, onward)           Start     Ordered   09/25/21  0000  No dressing needed        09/25/21 [id number]            Follow-up Information     [facility] Follow up in 3 week(s).   Specialty: Vascular Surgery Why: Office will call you to arrange your appt (sent) Contact information: [address] [place], [place], [place]. Schedule an appointment as soon as possible for a visit in 1 week(s).   Specialty: Family Medicine Contact information: [place] [address] [phone]                Allergies  Allergen Reactions   Strawberry (Diagnostic)    Strawberry Extract     Other reaction(s): Unknown      Consultations:  nephrology and vascular surgery    Other Procedures/Studies: MR FOOT RIGHT WO CONTRAST  Result Date: 09/18/2021 CLINICAL DATA:  Osteomyelitis, foot, right toes open wound for 2 months EXAM: MRI OF THE RIGHT FOREFOOT WITHOUT CONTRAST TECHNIQUE: Multiplanar, multisequence MR imaging of the right forefoot was performed. No intravenous contrast was administered. COMPARISON:  Right foot radiograph 09/15/2021 FINDINGS: Extensive motion artifact which significantly degrades image quality. Bones/Joint/Cartilage There is no frank bony destruction. There is mild edema signal within the middle and distal phalanges of all the toes on coronal T2 images, favored to be artifactual. There is no visible confluent low T1 signal, though there is significant motion artifact on the sequences. 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 Ligaments Intact Lisfranc ligament.  Intact collateral ligaments. Muscles and Tendons No significant muscle atrophy. Mild intramuscular edema, as is commonly seen in diabetics. Soft tissues Diffuse soft tissue swelling of the foot. No focal fluid collection is visible. IMPRESSION: Extensive motion artifact which significantly degrades image quality. Mild edema signal within the middle and distal  phalanges of all the toes on coronal T2 which is favored to be artifactual. No definite evidence of osteomyelitis. If there is persistent clinical concern, follow-up radiographs and/or MRI could be obtained. Electronically Signed   By: Maurine Simmering M.D.   On: 09/18/2021 15:51   PERIPHERAL VASCULAR CATHETERIZATION  Result Date: 09/20/2021 Images from the original result were not included.   Patient name: Nakota Lichtenstein    MRN: [id number]        DOB: 1951/01/18          Sex: male  09/20/2021 Pre-operative Diagnosis: Left lower extremity [place] 5 critical limb ischemia Post-operative diagnosis:  Same Surgeon:  Broadus John, MD Procedure Performed: 1.  Ultrasound-guided micropuncture  access of the left common femoral artery 2.  Aortogram 3.  Bilateral lower extremity angiogram 4.  Third order cannulation, right lower extremity angiogram 5.  Moderate sedation, 86 minutes   Indications: Patient is a 70 year old male who presented to the hospital with rest pain and wounds on his right foot, with concern for infection.  ABIs demonstrated severe peripheral arterial disease, and therefore after discussing the risks and benefits of lower extremity angiography, an attempt to improve perfusion, [place] elected to proceed  Findings: Aorta: Bilateral renal arteries patent, atretic, left-sided accessory.-Patient with end-stage renal disease. On the right, moderate aortoiliac atherosclerotic disease.  There is no flow-limiting stenosis in the common iliac, external iliac artery.  The internal iliac arteries patent.  There is significant common femoral artery disease, stenosis at the ostium of the profunda, the SFA has severe atherosclerotic disease with multiple areas of total occlusion, and reconstitution from profunda collaterals.  Distally, the superficial femoral artery is patent with severe disease, which continues into the popliteal artery.  Outflow demonstrates patent peroneal and posterior tibial arteries however there  are multiple, tandem, flow-limiting stenoses within the peroneal. The posterior tibial artery appears patent, however small in size. This is likely due to being under-filled.  Runoff to the foot is posterior tibial artery dominant with both medial and lateral perforators from the peroneal artery appreciated.  On the left: No flow-limiting stenosis appreciated through the common iliac and external iliac arteries.  The internal iliac artery is widely patent.  There is severe atherosclerotic disease of the left common femoral artery which is flow-limiting.  The profunda is widely patent.  There is severe disease of the superficial femoral artery with focal mid SFA occlusion.  Distally, there is reconstitution with severe disease continued through the popliteal artery.  The tibial trifurcation appears patent. Runoff was poorly assessed due to contrast washout.  Procedure:  The patient was identified in the holding area and taken to room 8.  The patient was then placed supine on the table and prepped and draped in the usual sterile fashion.  A time out was called.  Ultrasound was used to evaluate the left common femoral artery.  It was patent .  A digital ultrasound image was acquired.  A micropuncture needle was used to access the left common femoral artery under ultrasound guidance.  An 018 wire was advanced without resistance and a micropuncture sheath was placed.  The 018 wire was removed and a benson wire was placed.  The micropuncture sheath was exchanged for a 5 french sheath.  An omniflush catheter was advanced over the wire to the level of L-1.  An abdominal anram was obtained.  Flush catheter was then down to the terminal aorta for bilateral lower extremity angiogram.  To assess right lower extremity runoff, a flush catheter was advanced into the common femoral artery.  Intervention: A 6 x 45 cm sheath was brought into the field and advanced into the right common femoral artery.  The SFA was cannulated and true  lumen confirmed using angiography.  Several wires and catheters were used, however I was unable to traverse the significant disease in the superficial femoral artery due to tandem chronic total occlusions.  Plan: Patient will be vein mapped for possible open bypass versus high risk endovascular intervention I will discuss this with the patient and discussed findings with Dr. Carlis Abbott.  Cassandria Santee, MD [facility] Office: [phone]     US ARTERIAL ABI (SCREENING LOWER EXTREMITY)  Result Date: 09/16/2021 CLINICAL DATA:  70 year old male with a history faint pulses EXAM: NONINVASIVE PHYSIOLOGIC VASCULAR STUDY OF BILATERAL LOWER EXTREMITIES TECHNIQUE: Evaluation of both lower extremities was performed at rest, including calculation of ankle-brachial indices, multiple segmental pressure evaluation, segmental Doppler and segmental pulse volume recording. COMPARISON:  None. FINDINGS: Right ABI:  0.63 Left ABI:  0.44 Right Lower Extremity: Segmental Doppler at the right ankle demonstrates monophasic posterior tibial artery and biphasic dorsalis pedis Left Lower Extremity: Segmental Doppler at the left ankle demonstrates monophasic waveforms IMPRESSION: Resting ABI of the bilateral lower extremities in the moderate range arterial occlusive disease, though would likely decrease after exercise exam. Segmental Doppler at the ankles demonstrates evidence of tibial or more proximal occlusive disease. Bilaterally, worst on the left. Signed, Dulcy Fanny. Dellia Nims, RPVI [facility] Radiology Electronically Signed   By: Corrie Mckusick D.O.   On: 09/16/2021 10:27   DG Foot Complete Right  Result Date: 09/15/2021 CLINICAL DATA:  Swelling EXAM: RIGHT FOOT COMPLETE - 3+ VIEW COMPARISON:  None. FINDINGS: There is no acute osseous abnormality. There is moderate to severe first MTP joint degenerative arthritis. There is no frank bony destruction or  osseous erosion. There is diffuse soft tissue swelling of the foot. Extensive vascular calcifications. Plantar dorsal calcaneal spurring. IMPRESSION: Extensive soft tissue swelling of the foot. No acute osseous abnormality. Moderate-severe first MTP osteoarthritis. Electronically Signed   By: Maurine Simmering M.D.   On: 09/15/2021 15:08   VAS [place] LOWER EXTREMITY SAPHENOUS VEIN MAPPING  Result Date: 09/21/2021 LOWER EXTREMITY VEIN MAPPING Patient Name:  AMARIAN TIBOR  Date of Exam:   09/21/2021 Medical Rec #: [id number]     Accession #:    [id number] Date of Birth: [birth year]     Patient Gender: M Patient Age:   4 years Exam Location:  [facility] Procedure:      VAS [place] LOWER EXTREMITY SAPHENOUS VEIN MAPPING Referring Phys: JOSHUA ROBINS --------------------------------------------------------------------------------  Indications: Pre-op bypass, PAD  Comparison Study: No prior studies. Performing Technologist: Darlin Coco RDMS, RVT  Examination Guidelines: A complete evaluation includes B-mode imaging, spectral Doppler, color Doppler, and power Doppler as needed of all accessible portions of each vessel. Bilateral testing is considered an integral part of a complete examination. Limited examinations for reoccurring indications may be performed as noted. +---------------+-----------+----------------------+---------------+-----------+   RT Diameter  RT Findings         GSV            LT Diameter  LT Findings      (cm)                                            (cm)                  +---------------+-----------+----------------------+---------------+-----------+      0.73                     Saphenofemoral         0.60                                                   Junction                                  +---------------+-----------+----------------------+---------------+-----------+  0.51       branching     Proximal thigh         0.49       branching   +---------------+-----------+----------------------+---------------+-----------+      0.38                       Mid thigh            0.40                  +---------------+-----------+----------------------+---------------+-----------+      0.35                      Distal thigh          0.30       branching  +---------------+-----------+----------------------+---------------+-----------+      0.32                          Knee              0.24                  +---------------+-----------+----------------------+---------------+-----------+      0.30       branching       Prox calf            0.30                  +---------------+-----------+----------------------+---------------+-----------+      0.30                        Mid calf            0.31                  +---------------+-----------+----------------------+---------------+-----------+      0.30       branching      Distal calf           0.32                  +---------------+-----------+----------------------+---------------+-----------+      0.39                         Ankle              0.32                  +---------------+-----------+----------------------+---------------+-----------+ Diagnosing physician: Orlie Pollen Electronically signed by Orlie Pollen on 09/21/2021 at 3:00:31 PM.    Final      TODAY-DAY OF DISCHARGE:  Subjective:   [name] today has no headache,no chest abdominal pain,no new weakness tingling or numbness, feels much better wants to go home today.   Objective:   Blood pressure (!) 114/58, pulse 79, temperature 98.1 F (36.7 C), temperature source Oral, resp. rate 15, height '5\' 5"'$  (1.651 m), weight 82.6 kg, SpO2 100 %. No intake or output data in the 24 hours ending 09/25/21 0945 Filed Weights   09/21/21 1053 09/23/21 0815 09/23/21 1130  Weight: 83.1 kg 84 kg 82.6 kg    Exam: Awake Alert, Oriented *3, No new F.N deficits, Normal affect [place].AT,PERRAL Supple  Neck,No JVD, No cervical lymphadenopathy appriciated.  Symmetrical Chest wall movement, Good air movement bilaterally, CTAB RRR,No Gallops,Rubs or new Murmurs, No Parasternal Heave +ve B.Sounds, Abd Soft, Non tender, No organomegaly appriciated, No rebound -guarding or rigidity. No  Cyanosis, Clubbing or edema, No new Rash or bruise   PERTINENT RADIOLOGIC STUDIES: No results found.   PERTINENT LAB RESULTS: CBC: Recent Labs    09/23/21 0427 09/23/21 1951 09/24/21 0136  WBC 7.1  --  7.3  HGB 6.8* 8.7* 8.0*  HCT 19.8* 26.1* 24.7*  PLT 171  --  202   CMET CMP     Component Value Date/Time   NA 138 09/24/2021 0136   NA 140 08/23/2021 1208   K 4.0 09/24/2021 0136   CL 101 09/24/2021 0136   CO2 27 09/24/2021 0136   GLUCOSE 116 (H) 09/24/2021 0136   BUN 30 (H) 09/24/2021 0136   BUN 39 (H) 08/23/2021 1208   CREATININE 7.08 (H) 09/24/2021 0136   CALCIUM 8.4 (L) 09/24/2021 0136   PROT 6.1 (L) 09/18/2021 0409   PROT 6.8 08/23/2021 1208   ALBUMIN 2.5 (L) 09/24/2021 0136   ALBUMIN 4.3 08/23/2021 1208   AST 37 09/18/2021 0409   ALT 18 09/18/2021 0409   ALKPHOS 52 09/18/2021 0409   BILITOT 0.8 09/18/2021 0409   BILITOT <0.2 08/23/2021 1208   GFRNONAA 8 (L) 09/24/2021 0136   GFRAA 6 (L) 04/20/2020 0545    GFR Estimated Creatinine Clearance: 9.3 mL/min (A) (by C-G formula based on SCr of 7.08 mg/dL (H)). No results for input(s): LIPASE, AMYLASE in the last 72 hours. No results for input(s): CKTOTAL, CKMB, CKMBINDEX, TROPONINI in the last 72 hours. Invalid input(s): POCBNP No results for input(s): DDIMER in the last 72 hours. No results for input(s): HGBA1C in the last 72 hours. Recent Labs    09/23/21 0427  CHOL 91  HDL 19*  LDLCALC 54  TRIG 91  CHOLHDL 4.8   No results for input(s): TSH, T4TOTAL, T3FREE, THYROIDAB in the last 72 hours.  Invalid input(s): FREET3 No results for input(s): VITAMINB12, FOLATE, FERRITIN, TIBC, IRON, RETICCTPCT in the last 72  hours. Coags: No results for input(s): INR in the last 72 hours.  Invalid input(s): PT Microbiology: Recent Results (from the past 240 hour(s))  Culture, blood (routine x 2)     Status: None   Collection Time: 09/15/21  2:56 PM   Specimen: Left Antecubital; Blood  Result Value Ref Range Status   Specimen Description LEFT ANTECUBITAL  Final   Special Requests   Final    Blood Culture adequate volume BOTTLES DRAWN AEROBIC AND ANAEROBIC   Culture   Final    NO GROWTH 5 DAYS Performed at [facility], [address], [place], [place] [address]    Report Status 09/20/2021 FINAL  Final  Culture, blood (routine x 2)     Status: None   Collection Time: 09/15/21  3:04 PM   Specimen: BLOOD LEFT HAND  Result Value Ref Range Status   Specimen Description BLOOD LEFT HAND  Final   Special Requests   Final    Blood Culture results may not be optimal due to an excessive volume of blood received in culture bottles BOTTLES DRAWN AEROBIC AND ANAEROBIC   Culture   Final    NO GROWTH 5 DAYS Performed at [facility], [address], [place], [place] [address]    Report Status 09/20/2021 FINAL  Final  Resp Panel by RT-PCR (Flu A&B, Covid) Nasopharyngeal Swab     Status: None   Collection Time: 09/15/21  5:54 PM   Specimen: Nasopharyngeal Swab; Nasopharyngeal(NP) swabs in vial transport medium  Result Value Ref Range Status   SARS Coronavirus 2 by RT PCR NEGATIVE  NEGATIVE Final    Comment: (NOTE) SARS-CoV-2 target nucleic acids are NOT DETECTED.  The SARS-CoV-2 RNA is generally detectable in upper respiratory specimens during the acute phase of infection. The lowest concentration of SARS-CoV-2 viral copies this assay can detect is 138 copies/mL. A negative result does not preclude SARS-Cov-2 infection and should not be used as the sole basis for treatment or other patient management decisions. A negative result may occur with  improper specimen collection/handling, submission of specimen  other than nasopharyngeal swab, presence of viral mutation(s) within the areas targeted by this assay, and inadequate number of viral copies(<138 copies/mL). A negative result must be combined with clinical observations, patient history, and epidemiological information. The expected result is Negative.  Fact Sheet for Patients:  EntrepreneurPulse.com.au  Fact Sheet for Healthcare Providers:  IncredibleEmployment.be  This test is no t yet approved or cleared by the [place] FDA and  has been authorized for detection and/or diagnosis of SARS-CoV-2 by FDA under an Emergency Use Authorization (EUA). This EUA will remain  in effect (meaning this test can be used) for the duration of the COVID-19 declaration under Section 564(b)(1) of the Act, 21 U.S.C.section 360bbb-3(b)(1), unless the authorization is terminated  or revoked sooner.       Influenza A by PCR NEGATIVE NEGATIVE Final   Influenza B by PCR NEGATIVE NEGATIVE Final    Comment: (NOTE) The Xpert Xpress SARS-CoV-2/FLU/RSV plus assay is intended as an aid in the diagnosis of influenza from Nasopharyngeal swab specimens and should not be used as a sole basis for treatment. Nasal washings and aspirates are unacceptable for Xpert Xpress SARS-CoV-2/FLU/RSV testing.  Fact Sheet for Patients: EntrepreneurPulse.com.au  Fact Sheet for Healthcare Providers: IncredibleEmployment.be  This test is not yet approved or cleared by the [place] FDA and has been authorized for detection and/or diagnosis of SARS-CoV-2 by FDA under an Emergency Use Authorization (EUA). This EUA will remain in effect (meaning this test can be used) for the duration of the COVID-19 declaration under Section 564(b)(1) of the Act, 21 U.S.C. section 360bbb-3(b)(1), unless the authorization is terminated or revoked.  Performed at [facility], [address], [place], [place]  [address]   MRSA Next Gen by PCR, Nasal     Status: Abnormal   Collection Time: 09/22/21  3:47 AM  Result Value Ref Range Status   MRSA by PCR Next Gen MRSA DETECTED (A) NOT DETECTED Final    Comment: RESULT CALLED TO, READ BACK BY AND VERIFIED WITH: T IRBY,RN'@0534'$  09/22/21 [employer] Performed at [facility], [place] [address] [place], [place] [address]     FURTHER DISCHARGE INSTRUCTIONS:  Get Medicines reviewed and adjusted: Please take all your medications with you for your next visit with your Primary MD  Laboratory/radiological data: Please request your Primary MD to go over all hospital tests and procedure/radiological results at the follow up, please ask your Primary MD to get all Hospital records sent to his/her office.  In some cases, they will be blood work, cultures and biopsy results pending at the time of your discharge. Please request that your primary care M.D. goes through all the records of your hospital data and follows up on these results.  Also Note the following: If you experience worsening of your admission symptoms, develop shortness of breath, life threatening emergency, suicidal or homicidal thoughts you must seek medical attention immediately by calling 911 or calling your MD immediately  if symptoms less severe.  You must read complete instructions/literature along  with all the possible adverse reactions/side effects for all the Medicines you take and that have been prescribed to you. Take any new Medicines after you have completely understood and accpet all the possible adverse reactions/side effects.   Do not drive when taking Pain medications or sleeping medications (Benzodaizepines)  Do not take more than prescribed Pain, Sleep and Anxiety Medications. It is not advisable to combine anxiety,sleep and pain medications without talking with your primary care practitioner  Special Instructions: If you have smoked or chewed Tobacco  in the last 2 yrs please stop  smoking, stop any regular Alcohol  and or any Recreational drug use.  Wear Seat belts while driving.  Please note: You were cared for by a hospitalist during your hospital stay. Once you are discharged, your primary care physician will handle any further medical issues. Please note that NO REFILLS for any discharge medications will be authorized once you are discharged, as it is imperative that you return to your primary care physician (or establish a relationship with a primary care physician if you do not have one) for your post hospital discharge needs so that they can reassess your need for medications and monitor your lab values.  Total Time spent coordinating discharge including counseling, education and face to face time equals 35 minutes.  SignedOren Binet 09/25/2021 9:45 AM

## 2021-09-25 NOTE — Care Management Important Message (Signed)
Important Message  Patient Details  Name: Kabe Arther MRN: [id number] Date of Birth: October 10, 1951   Medicare Important Message Given:  Yes     Shelda Altes 09/25/2021, 10:44 AM

## 2021-09-25 NOTE — Progress Notes (Addendum)
Progress Note    09/25/2021 7:27 AM 3 Days Post-Op  Subjective:  ready to go home  Afebrile HR 70's-80's NSR [id number] systolic [id number] RA  Vitals:   09/25/21 0004 09/25/21 0428  BP: (!) 120/59 (!) 108/54  Pulse: 75 72  Resp: 16 16  Temp: 98.6 F (37 C) 97.9 F (36.6 C)  SpO2: 98% 100%    Physical Exam: Cardiac:  regular Lungs:  non labored Incisions:  all incisions are healing nicely.  Toe amp site is marginal   Extremities:  brisk doppler signals right DP/PT   CBC    Component Value Date/Time   WBC 7.3 09/24/2021 0136   RBC 2.41 (L) 09/24/2021 0136   HGB 8.0 (L) 09/24/2021 0136   HCT 24.7 (L) 09/24/2021 0136   HCT 26.1 (L) 04/16/2020 1052   PLT 202 09/24/2021 0136   MCV 102.5 (H) 09/24/2021 0136   MCH 33.2 09/24/2021 0136   MCHC 32.4 09/24/2021 0136   RDW 17.4 (H) 09/24/2021 0136   LYMPHSABS 0.9 09/15/2021 1504   MONOABS 0.6 09/15/2021 1504   EOSABS 0.3 09/15/2021 1504   BASOSABS 0.0 09/15/2021 1504    BMET    Component Value Date/Time   NA 138 09/24/2021 0136   NA 140 08/23/2021 1208   K 4.0 09/24/2021 0136   CL 101 09/24/2021 0136   CO2 27 09/24/2021 0136   GLUCOSE 116 (H) 09/24/2021 0136   BUN 30 (H) 09/24/2021 0136   BUN 39 (H) 08/23/2021 1208   CREATININE 7.08 (H) 09/24/2021 0136   CALCIUM 8.4 (L) 09/24/2021 0136   GFRNONAA 8 (L) 09/24/2021 0136   GFRAA 6 (L) 04/20/2020 0545    INR No results found for: INR   Intake/Output Summary (Last 24 hours) at 09/25/2021 0727 Last data filed at 09/24/2021 0830 Gross per 24 hour  Intake 240 ml  Output 0 ml  Net 240 ml     Assessment/Plan:  70 y.o. male is s/p:  right common femoral endarterectomy with a right common femoral to below-knee pop bypass with great saphenous vein for CLI with tissue loss.  We also did a partial amputation of the right second toe.   3 Days Post-Op   -pt with brisk right DP/PT doppler signals -incisions continue to look good this morning. -discussed with  pt to try to elevate leg if he can as he does have some swelling but he states it feels worse to elevate.   -continue to monitor toe amp site-will see pt back in a couple of weeks and our office will arrange.  -continue asa/plavix/lipitor -DVT prophylaxis:  sq hepairn -PDMP reviewed.  Percocet 5/325 one q6h prn pain #20 no refill sent electronically.    Leontine Locket, PA-C [facility] [phone] 09/25/2021 7:27 AM  I have seen and evaluated the patient. I agree with the PA note as documented above.  Postop day 3 status post right common femoral endarterectomy with patch angioplasty and right common femoral to below-knee pop bypass with saphenous vein and partial right second toe amputation.  All of his incisions look excellent.  He has very brisk Doppler signals in the foot.  The toe amputation is demarcating as pictured above with some blistering and discoloration.  I would allow this to demarcate as an outpatient before revising it additionally given he has excellent results from his bypass.  Okay with discharge today.  Aspirin Plavix statin at discharge.  We will arrange follow-up in 2 weeks for wound check.  Discussed  may ultimately require further revision of the toe amputation.  Marty Heck, MD [facility] [place] Office: [phone]

## 2021-09-25 NOTE — Progress Notes (Signed)
Patient d/c to home with self care. VS stable. Discharge instructions reviewed and medications from [facility] sent with patient. Patient foot wrapped prior to d/c. He left in private vehicle with friend. Fuller Canada, RN

## 2021-09-26 ENCOUNTER — Telehealth: Payer: Self-pay

## 2021-09-26 ENCOUNTER — Telehealth: Payer: Self-pay | Admitting: Physician Assistant

## 2021-09-26 LAB — PATHOLOGIST SMEAR REVIEW

## 2021-09-26 NOTE — Telephone Encounter (Signed)
Transition of care contact from inpatient facility  Date of discharge: 09/25/21 Date of contact:  09/26/21 Method: Phone Spoke to: Patient  Patient contacted to discuss transition of care from recent inpatient hospitalization. Patient was admitted to [facility] from 09/15/21-09/25/21 with discharge diagnosis of R food infection 2/2 PAD, s/p partial amputation. Missed HD today because he had diarrhea. Concerning for c.diff given recent antibiotic use. Advised to call PCP if diarrhea is not better tomorrow. Planning to make up his dialysis treatment tomorrow. He has a ride to the pharmacy, no other needs identified at this time.   Medication changes were reviewed.  Patient will follow up with his/her outpatient HD unit on: 09/27/21  Anice Paganini, PA-C 09/26/2021, 12:47 PM  [facility]

## 2021-09-26 NOTE — Telephone Encounter (Signed)
Transition Care Management Unsuccessful Follow-up Telephone Call  Date of discharge and from where:  [facility]  Diagnosis: cellulitis right foot   Attempts:  1st Attempt  Reason for unsuccessful TCM follow-up call:  Left voice message

## 2021-09-27 ENCOUNTER — Other Ambulatory Visit (HOSPITAL_COMMUNITY): Payer: Self-pay

## 2021-09-27 NOTE — Telephone Encounter (Signed)
Transition Care Management Follow-up Telephone Call Date of discharge and from where: [facility] 09/25[facility]21 Diagnosis: cellulitis right foot How have you been since you were released from the hospital? Okay, still has diarrhea Any questions or concerns? Yes - He cannot read and he was given 6 new medications - he needs appt asap so that these can be explained to him   Items Reviewed: Did the pt receive and understand the discharge instructions provided? No  - he cannot read - appt made for tomorrow to discuss everything with PCP Medications obtained and verified?  Yes he picked up his medications; he cannot read, so I wasn't able to verify over the phone - appt tomorrow - he will bring them all to visit Other? No  Any new allergies since your discharge? No  Dietary orders reviewed? Yes Do you have support at home? No   Home Care and Equipment/Supplies: Were home health services ordered? They suggested it, but he refused services  Were any new equipment or medical supplies ordered?  No  Functional Questionnaire: (I = Independent and D = Dependent) ADLs: I  Bathing/Dressing- I  Meal Prep- I  Eating- I  Maintaining continence- I  Transferring/Ambulation- I  Managing Meds- I  Follow up appointments reviewed:  PCP Hospital f/u appt confirmed? Yes  Scheduled to see [name] on 09/28/21 @ 3:50. [facility] f/u appt confirmed? No  I told him he also need to make appt with vascular surgeon Are transportation arrangements needed? No  If their condition worsens, is the pt aware to call PCP or go to the Emergency Dept.? Yes Was the patient provided with contact information for the PCP's office or ED? Yes Was to pt encouraged to call back with questions or concerns? Yes

## 2021-09-28 ENCOUNTER — Inpatient Hospital Stay: Payer: 59 | Admitting: Family Medicine

## 2021-09-28 ENCOUNTER — Telehealth: Payer: Self-pay | Admitting: Family Medicine

## 2021-09-28 NOTE — Telephone Encounter (Signed)
Appointment given for Wednesday 10/26 @ 3:50 with [name], FNP.

## 2021-10-04 ENCOUNTER — Telehealth (HOSPITAL_COMMUNITY): Payer: Self-pay | Admitting: Pharmacist

## 2021-10-04 ENCOUNTER — Other Ambulatory Visit (HOSPITAL_COMMUNITY): Payer: Self-pay

## 2021-10-04 ENCOUNTER — Inpatient Hospital Stay: Payer: 59 | Admitting: Family Medicine

## 2021-10-04 NOTE — Telephone Encounter (Signed)
Pharmacy Transitions of Care Follow-up Telephone Call  Date of discharge: 09/25/2021   How have you been since you were released from the hospital? Okay   Medication changes made at discharge:  - START: clopidogrel, atorvastatin, pantoprazole  - STOPPED: clindamycin, Norco  - CHANGED:   Medication changes verified by the patient? Yes (Yes/No)    Medication Accessibility:  Home Pharmacy: [facility]   Was the patient provided with refills on discharged medications? Yes   Have all prescriptions been transferred from [facility] to home pharmacy? Yes   Is the patient able to afford medications? Yes    Medication Review: CLOPIDOGREL (PLAVIX) Clopidogrel 75 mg once daily.  - Educated patient on expected duration of therapy of  with clopidogrel. Advised patient that aspirin will be continued indefinitely.  - Reviewed potential DDIs with patient  - Advised patient of medications to avoid (NSAIDs, ASA)  - Educated that Tylenol (acetaminophen) will be the preferred analgesic to prevent risk of bleeding  - Emphasized importance of monitoring for signs and symptoms of bleeding (abnormal bruising, prolonged bleeding, nose bleeds, bleeding from gums, discolored urine, black tarry stools)  - Advised patient to alert all providers of anticoagulation therapy prior to starting a new medication or having a procedure     Follow-up Appointments:  [facility] f/u appt confirmed? Yes Scheduled to see Dr Carlis Abbott on Nov 1 @ 2.   If their condition worsens, is the pt aware to call PCP or go to the Emergency Dept.? Yes  Final Patient Assessment: Patient reports he is feeling better.  Reviewed medications, transfers completed.  Patient has someone to help with medications.

## 2021-10-06 ENCOUNTER — Encounter: Payer: Self-pay | Admitting: Family Medicine

## 2021-10-10 ENCOUNTER — Encounter: Payer: 59 | Admitting: Vascular Surgery

## 2021-10-11 ENCOUNTER — Other Ambulatory Visit: Payer: Self-pay

## 2021-10-11 ENCOUNTER — Ambulatory Visit (INDEPENDENT_AMBULATORY_CARE_PROVIDER_SITE_OTHER): Payer: 59 | Admitting: Physician Assistant

## 2021-10-11 VITALS — BP 150/76 | HR 93 | Temp 98.6°F | Resp 20 | Ht 65.0 in | Wt 190.7 lb

## 2021-10-11 DIAGNOSIS — I739 Peripheral vascular disease, unspecified: Secondary | ICD-10-CM

## 2021-10-11 NOTE — Progress Notes (Signed)
POST OPERATIVE OFFICE NOTE    CC:  F/u for surgery  HPI:  This is a 70 y.o. male who is s/p  Harvest of right leg great saphenous vein 2.  Right common femoral endarterectomy with bovine pericardial patch angioplasty 3.  Right common femoral to below-knee popliteal artery bypass with ipsilateral nonreversed great saphenous vein 4.  Partial right second toe amputation  Procedures performed on September 22, 2021 by Dr. Carlis Abbott.  This was secondary to critical limb ischemia of the right lower extremity with a necrotic right second toe.  The patient presents today for incision check. Main complaint today is RLE edema and residual right lower leg/foot pain.  He is compliant with daily aspirin, Plavix and statin. He continues to smoke approximately 1/2 pack cigarettes per day. He has history of hypertension, COPD and end-stage renal disease. HD via [facility] on T/T/S  He lives alone. His eyesight is poor.  Allergies  Allergen Reactions   Strawberry (Diagnostic)    Strawberry Extract     Other reaction(s): Unknown    Current Outpatient Medications  Medication Sig Dispense Refill   amLODipine (NORVASC) 5 MG tablet Take 5 mg by mouth daily.     aspirin EC 325 MG tablet Take 325 mg by mouth at bedtime.     atorvastatin (LIPITOR) 40 MG tablet Take 1 tablet (40 mg total) by mouth daily. 30 tablet 1   B Complex-C-Folic Acid (DIALYVITE 144 PO) Take 0.8 mg by mouth at bedtime.     calcitRIOL (ROCALTROL) 0.25 MCG capsule Take 1 capsule (0.25 mcg total) by mouth Every Tuesday,Thursday,and Saturday with dialysis. 90 capsule 0   Calcium Carbonate 500 MG CHEW Chew 500 mg by mouth at bedtime.     Cholecalciferol (VITAMIN D3 PO) Take by mouth daily.     clopidogrel (PLAVIX) 75 MG tablet Take 1 tablet (75 mg total) by mouth daily with breakfast. 30 tablet 1   fluticasone furoate-vilanterol (BREO ELLIPTA) 200-25 MCG/INH AEPB Inhale 1 puff into the lungs daily. 60 each 2   heparin 1000 unit/mL SOLN injection  Heparin Sodium (Porcine) 1,000 Units/mL Catheter Lock Arterial     iron sucrose in sodium chloride 0.9 % 100 mL Iron Sucrose (Venofer)     Methoxy PEG-Epoetin Beta (MIRCERA IJ) Mircera     metoprolol tartrate (LOPRESSOR) 25 MG tablet Take 1 tablet (25 mg total) by mouth 2 (two) times daily. 30 tablet 0   oxyCODONE-acetaminophen (PERCOCET) 5-325 MG tablet Take 1 tablet by mouth every 6 (six) hours as needed. 20 tablet 0   pantoprazole (PROTONIX) 40 MG tablet Take 1 tablet (40 mg total) by mouth daily. 30 tablet 0   sucroferric oxyhydroxide (VELPHORO) 500 MG chewable tablet Chew 1,000 mg by mouth 3 (three) times daily with meals.     tamsulosin (FLOMAX) 0.4 MG CAPS capsule Take 0.4 mg by mouth at bedtime.     albuterol (VENTOLIN HFA) 108 (90 Base) MCG/ACT inhaler Inhale 2 puffs into the lungs every 6 (six) hours as needed for wheezing or shortness of breath. 18 g 3   tiotropium (SPIRIVA HANDIHALER) 18 MCG inhalation capsule Place 1 capsule (18 mcg total) into inhaler and inhale daily. 30 capsule 2   No current facility-administered medications for this visit.     ROS:  See HPI  Vitals:   10/11/21 1457  BP: (!) 150/76  Pulse: 93  Resp: 20  Temp: 98.6 F (37 C)  SpO2: 100%     Physical Exam:  General appearance: Awake,  alert in no apparent distress Cardiac: Heart rate and rhythm are regular Respirations: Nonlabored Incisions: Right groin, thigh and lower leg incisions are all well approximated without bleeding or hematoma Extremities: Both feet are warm with intact sensation and motor function.  Right second toe amputation site with intact, dry eschar. Remains well approximated.   Pulse/Doppler exam: Brisk right dorsalis pedis, posterior tibial and peroneal artery Doppler signals. Palpable right femoral pulse.     Assessment/Plan:  This is a 70 y.o. male who is s/p: right CFA to BK pop bypass with vein. His bypass is patent. His residual 2nd toe is necrotic without evidence of  cellulitis. Cleaned with wound cleanser and painted with Betadine. Refill pain med rx. Follow-up in 2 weeks with arterial duplex and suture removal. Instructed him to call our office should he develop redness, drainage or foul odor from toe. Continue Darco shoe.  Smoking cessation; continue aspirin, Plavix and statin.  Risa Grill, PA-C [facility] [phone]  Clinic MD:  Dr. Donzetta Matters

## 2021-10-12 ENCOUNTER — Other Ambulatory Visit: Payer: Self-pay

## 2021-10-12 DIAGNOSIS — I739 Peripheral vascular disease, unspecified: Secondary | ICD-10-CM

## 2021-10-16 ENCOUNTER — Other Ambulatory Visit: Payer: Self-pay | Admitting: Physician Assistant

## 2021-10-16 MED ORDER — OXYCODONE-ACETAMINOPHEN 5-325 MG PO TABS: 1.0000 | ORAL_TABLET | Freq: Four times a day (QID) | ORAL | 0 refills | Status: DC | PRN

## 2021-10-16 NOTE — Progress Notes (Signed)
Pt wa here on 10/11/2021 and note states pain Rx was going to be refilled.  This was not sent from what I can tell with the PDMP.  I have sent Percocet 5/325 one po q6h prn pain #20 no refill to his pharmacy electronically.     Leontine Locket, [facility] 10/16/2021 10:07 AM

## 2021-10-25 ENCOUNTER — Ambulatory Visit (HOSPITAL_COMMUNITY)
Admission: RE | Admit: 2021-10-25 | Discharge: 2021-10-25 | Disposition: A | Discharge status: Home / Self Care | Payer: 59 | Source: Ambulatory Visit | Attending: Vascular Surgery | Admitting: Vascular Surgery

## 2021-10-25 ENCOUNTER — Other Ambulatory Visit: Payer: Self-pay

## 2021-10-25 ENCOUNTER — Ambulatory Visit (INDEPENDENT_AMBULATORY_CARE_PROVIDER_SITE_OTHER)
Admission: RE | Admit: 2021-10-25 | Discharge: 2021-10-25 | Disposition: A | Discharge status: Home / Self Care | Payer: 59 | Source: Ambulatory Visit | Attending: Vascular Surgery | Admitting: Vascular Surgery

## 2021-10-25 ENCOUNTER — Ambulatory Visit (INDEPENDENT_AMBULATORY_CARE_PROVIDER_SITE_OTHER): Payer: 59 | Admitting: Physician Assistant

## 2021-10-25 VITALS — BP 154/86 | HR 90 | Temp 98.7°F | Resp 20 | Ht 65.0 in | Wt 185.6 lb

## 2021-10-25 DIAGNOSIS — N186 End stage renal disease: Secondary | ICD-10-CM

## 2021-10-25 DIAGNOSIS — I739 Peripheral vascular disease, unspecified: Secondary | ICD-10-CM

## 2021-10-25 DIAGNOSIS — Z992 Dependence on renal dialysis: Secondary | ICD-10-CM

## 2021-10-25 NOTE — Progress Notes (Signed)
POST OPERATIVE OFFICE NOTE    CC:  F/u for surgery  HPI:  This is a 70 y.o. male who is s/p Harvest of right GSV, right common femoral endarterectomy with bovine pericardial patch angioplasty, right common femoral to below knee popliteal artery bypass with non reversed ipsilateral greater saphenous vein, and partial right 2nd toe amputation by Dr. Carlis Abbott on 09/22/21. This was secondary to CLI with necrotic second right toe. He was seen last on 10/11/21 for post operative incision check. He was doing well overall with some residual RLE pain and edema. His bypass was patent on exam. He did have necrosis of 2nd toe amputation site without cellulitis.   He presents today for wound follow up and non invasive studies. He says all of his lower extremity pain is resolved. He can ambulate without any claudication symptoms. No pain at rest. He does still have some RLE edema but this has improved. He elevates nightly. He feels that his 2nd toe is stable. He has not noticed any redness or drainage. No fever or chills. He paints it daily with Betadine. He has been compliant with his Aspirin, statin, and Plavix  He is on aspirin daily as well as plavix He is on a statin for hyperlipidemia He takes CCB and BB for hypertension He is not diabetic  He is ESRD on dialysis via [facility] on TTS  Allergies  Allergen Reactions   Strawberry (Diagnostic)    Strawberry Extract     Other reaction(s): Unknown    Current Outpatient Medications  Medication Sig Dispense Refill   albuterol (VENTOLIN HFA) 108 (90 Base) MCG/ACT inhaler Inhale 2 puffs into the lungs every 6 (six) hours as needed for wheezing or shortness of breath. 18 g 3   amLODipine (NORVASC) 5 MG tablet Take 5 mg by mouth daily.     aspirin EC 325 MG tablet Take 325 mg by mouth at bedtime.     atorvastatin (LIPITOR) 40 MG tablet Take 1 tablet (40 mg total) by mouth daily. 30 tablet 1   B Complex-C-Folic Acid (DIALYVITE 448 PO) Take 0.8 mg by mouth at  bedtime.     calcitRIOL (ROCALTROL) 0.25 MCG capsule Take 1 capsule (0.25 mcg total) by mouth Every Tuesday,Thursday,and Saturday with dialysis. 90 capsule 0   Calcium Carbonate 500 MG CHEW Chew 500 mg by mouth at bedtime.     Cholecalciferol (VITAMIN D3 PO) Take by mouth daily.     clopidogrel (PLAVIX) 75 MG tablet Take 1 tablet (75 mg total) by mouth daily with breakfast. 30 tablet 1   fluticasone furoate-vilanterol (BREO ELLIPTA) 200-25 MCG/INH AEPB Inhale 1 puff into the lungs daily. 60 each 2   heparin 1000 unit/mL SOLN injection Heparin Sodium (Porcine) 1,000 Units/mL Catheter Lock Arterial     Methoxy PEG-Epoetin Beta (MIRCERA IJ) Mircera     metoprolol tartrate (LOPRESSOR) 25 MG tablet Take 1 tablet (25 mg total) by mouth 2 (two) times daily. 30 tablet 0   oxyCODONE-acetaminophen (PERCOCET) 5-325 MG tablet Take 1 tablet by mouth every 6 (six) hours as needed. 20 tablet 0   pantoprazole (PROTONIX) 40 MG tablet Take 1 tablet (40 mg total) by mouth daily. 30 tablet 0   sucroferric oxyhydroxide (VELPHORO) 500 MG chewable tablet Chew 1,000 mg by mouth 3 (three) times daily with meals.     tamsulosin (FLOMAX) 0.4 MG CAPS capsule Take 0.4 mg by mouth at bedtime.     tiotropium (SPIRIVA HANDIHALER) 18 MCG inhalation capsule Place 1 capsule (18  mcg total) into inhaler and inhale daily. 30 capsule 2   No current facility-administered medications for this visit.     ROS:  See HPI  Physical Exam:  Vitals:   10/25/21 1245  BP: (!) 154/86  Pulse: 90  Resp: 20  Temp: 98.7 F (37.1 C)  TempSrc: Temporal  SpO2: 98%  Weight: 185 lb 9.6 oz (84.2 kg)  Height: 5\' 5"  (1.651 m)   General: well appearing, well nourished, in no distress Incision:  Right groin and right lower extremity incisions healing very nicely. Right 2nd toe with necrosis. No drainage. No erythema. 2 nylon sutures removed  Extremities: 2+ femoral pulses bilaterally. Right Dp palpable. Left Dp not palpable. well perfused and  warm. Motor and sensation intact. RLE with mild edema Neuro: alert and oriented  Assessment/Plan:  This is a 70 y.o. male who is s/p Harvest of right GSV, right common femoral endarterectomy with bovine pericardial patch angioplasty, right common femoral to below knee popliteal artery bypass with non reversed ipsilateral greater saphenous vein, and partial right 2nd toe amputation by Dr. Carlis Abbott on 09/22/21. This was secondary to CLI with necrotic second right toe. - right lower extremity incisions have healed very nicely. Right 2nd toe with residual necrotic tissue. No signs of infect. Advised him to continue painting with betadine and watching for any signs of infection. Nylon sutures removed today - ABI is non compressible. RLE duplex shows patent bypass graft.  -continue Asa, Statin, Plavix - continue to allow right 2nd toe to demarcate - he will call for earlier follow up if he has any concerns  - will have him follow up in 6 weeks for wound check   Karoline Caldwell, PA-C [facility] [phone]  Clinic MD:  [facility]/ [facility] Dock

## 2021-11-14 ENCOUNTER — Telehealth: Payer: Self-pay | Admitting: Family Medicine

## 2021-11-22 ENCOUNTER — Ambulatory Visit (INDEPENDENT_AMBULATORY_CARE_PROVIDER_SITE_OTHER): Payer: 59

## 2021-11-22 VITALS — Ht 65.0 in | Wt 186.0 lb

## 2021-11-22 DIAGNOSIS — Z0000 Encounter for general adult medical examination without abnormal findings: Secondary | ICD-10-CM | POA: Diagnosis not present

## 2021-11-22 DIAGNOSIS — Z599 Problem related to housing and economic circumstances, unspecified: Secondary | ICD-10-CM

## 2021-11-22 NOTE — Patient Instructions (Signed)
[name] , Thank you for taking time to come for your Medicare Wellness Visit. I appreciate your ongoing commitment to your health goals. Please review the following plan we discussed and let me know if I can assist you in the future.   Screening recommendations/referrals: Colonoscopy: Declined Recommended yearly ophthalmology/optometry visit for glaucoma screening and checkup Recommended yearly dental visit for hygiene and checkup  Vaccinations: Influenza vaccine: Declined Pneumococcal vaccine: Declined Tdap vaccine: Done 05/11/2017 - Repeat in 10 years Shingles vaccine: Declined   Covid-19: Declined  Advanced directives: Please bring a copy of your health care power of attorney and living will to the office to be added to your chart at your convenience.   Conditions/risks identified: Aim for 30 minutes of exercise or brisk walking each day, drink 6-8 glasses of water and eat lots of fruits and vegetables.   Next appointment: Follow up in one year for your annual wellness visit.   Preventive Care 31 Years and Older, Male  Preventive care refers to lifestyle choices and visits with your health care provider that can promote health and wellness. What does preventive care include? A yearly physical exam. This is also called an annual well check. Dental exams once or twice a year. Routine eye exams. Ask your health care provider how often you should have your eyes checked. Personal lifestyle choices, including: Daily care of your teeth and gums. Regular physical activity. Eating a healthy diet. Avoiding tobacco and drug use. Limiting alcohol use. Practicing safe sex. Taking low doses of aspirin every day. Taking vitamin and mineral supplements as recommended by your health care provider. What happens during an annual well check? The services and screenings done by your health care provider during your annual well check will depend on your age, overall health, lifestyle risk  factors, and family history of disease. Counseling  Your health care provider may ask you questions about your: Alcohol use. Tobacco use. Drug use. Emotional well-being. Home and relationship well-being. Sexual activity. Eating habits. History of falls. Memory and ability to understand (cognition). Work and work Statistician. Screening  You may have the following tests or measurements: Height, weight, and BMI. Blood pressure. Lipid and cholesterol levels. These may be checked every 5 years, or more frequently if you are over 1 years old. Skin check. Lung cancer screening. You may have this screening every year starting at age 66 if you have a 30-pack-year history of smoking and currently smoke or have quit within the past 15 years. Fecal occult blood test (FOBT) of the stool. You may have this test every year starting at age 67. Flexible sigmoidoscopy or colonoscopy. You may have a sigmoidoscopy every 5 years or a colonoscopy every 10 years starting at age 44. Prostate cancer screening. Recommendations will vary depending on your family history and other risks. Hepatitis C blood test. Hepatitis B blood test. Sexually transmitted disease (STD) testing. Diabetes screening. This is done by checking your blood sugar (glucose) after you have not eaten for a while (fasting). You may have this done every 1-3 years. Abdominal aortic aneurysm (AAA) screening. You may need this if you are a current or former smoker. Osteoporosis. You may be screened starting at age 28 if you are at high risk. Talk with your health care provider about your test results, treatment options, and if necessary, the need for more tests. Vaccines  Your health care provider may recommend certain vaccines, such as: Influenza vaccine. This is recommended every year. Tetanus, diphtheria, and acellular pertussis (  Tdap, Td) vaccine. You may need a Td booster every 10 years. Zoster vaccine. You may need this after age  61. Pneumococcal 13-valent conjugate (PCV13) vaccine. One dose is recommended after age 56. Pneumococcal polysaccharide (PPSV23) vaccine. One dose is recommended after age 70. Talk to your health care provider about which screenings and vaccines you need and how often you need them. This information is not intended to replace advice given to you by your health care provider. Make sure you discuss any questions you have with your health care provider. Document Released: 12/23/2015 Document Revised: 08/15/2016 Document Reviewed: 09/27/2015 Elsevier Interactive Patient Education  2017 [place] Prevention in the Home Falls can cause injuries. They can happen to people of all ages. There are many things you can do to make your home safe and to help prevent falls. What can I do on the outside of my home? Regularly fix the edges of walkways and driveways and fix any cracks. Remove anything that might make you trip as you walk through a door, such as a raised step or threshold. Trim any bushes or trees on the path to your home. Use bright outdoor lighting. Clear any walking paths of anything that might make someone trip, such as rocks or tools. Regularly check to see if handrails are loose or broken. Make sure that both sides of any steps have handrails. Any raised decks and porches should have guardrails on the edges. Have any leaves, snow, or ice cleared regularly. Use sand or salt on walking paths during winter. Clean up any spills in your garage right away. This includes oil or grease spills. What can I do in the bathroom? Use night lights. Install grab bars by the toilet and in the tub and shower. Do not use towel bars as grab bars. Use non-skid mats or decals in the tub or shower. If you need to sit down in the shower, use a plastic, non-slip stool. Keep the floor dry. Clean up any water that spills on the floor as soon as it happens. Remove soap buildup in the tub or shower  regularly. Attach bath mats securely with double-sided non-slip rug tape. Do not have throw rugs and other things on the floor that can make you trip. What can I do in the bedroom? Use night lights. Make sure that you have a light by your bed that is easy to reach. Do not use any sheets or blankets that are too big for your bed. They should not hang down onto the floor. Have a firm chair that has side arms. You can use this for support while you get dressed. Do not have throw rugs and other things on the floor that can make you trip. What can I do in the kitchen? Clean up any spills right away. Avoid walking on wet floors. Keep items that you use a lot in easy-to-reach places. If you need to reach something above you, use a strong step stool that has a grab bar. Keep electrical cords out of the way. Do not use floor polish or wax that makes floors slippery. If you must use wax, use non-skid floor wax. Do not have throw rugs and other things on the floor that can make you trip. What can I do with my stairs? Do not leave any items on the stairs. Make sure that there are handrails on both sides of the stairs and use them. Fix handrails that are broken or loose. Make sure that handrails are  as long as the stairways. Check any carpeting to make sure that it is firmly attached to the stairs. Fix any carpet that is loose or worn. Avoid having throw rugs at the top or bottom of the stairs. If you do have throw rugs, attach them to the floor with carpet tape. Make sure that you have a light switch at the top of the stairs and the bottom of the stairs. If you do not have them, ask someone to add them for you. What else can I do to help prevent falls? Wear shoes that: Do not have high heels. Have rubber bottoms. Are comfortable and fit you well. Are closed at the toe. Do not wear sandals. If you use a stepladder: Make sure that it is fully opened. Do not climb a closed stepladder. Make sure that  both sides of the stepladder are locked into place. Ask someone to hold it for you, if possible. Clearly mark and make sure that you can see: Any grab bars or handrails. First and last steps. Where the edge of each step is. Use tools that help you move around (mobility aids) if they are needed. These include: Canes. Walkers. Scooters. Crutches. Turn on the lights when you go into a dark area. Replace any light bulbs as soon as they burn out. Set up your furniture so you have a clear path. Avoid moving your furniture around. If any of your floors are uneven, fix them. If there are any pets around you, be aware of where they are. Review your medicines with your doctor. Some medicines can make you feel dizzy. This can increase your chance of falling. Ask your doctor what other things that you can do to help prevent falls. This information is not intended to replace advice given to you by your health care provider. Make sure you discuss any questions you have with your health care provider. Document Released: 09/22/2009 Document Revised: 05/03/2016 Document Reviewed: 12/31/2014 Elsevier Interactive Patient Education  2017 [employer].

## 2021-11-22 NOTE — Progress Notes (Signed)
Subjective:   Kerry Underwood is a 70 y.o. male who presents for an Initial Medicare Annual Wellness Visit.  Virtual Visit via Telephone Note  I connected with  Kerry Underwood on 11/22/21 at  8:15 AM EST by telephone and verified that I am speaking with the correct person using two identifiers.  Location: Patient: Home Provider: [facility] Persons participating in the virtual visit: patient/Nurse Health Advisor   I discussed the limitations, risks, security and privacy concerns of performing an evaluation and management service by telephone and the availability of in person appointments. The patient expressed understanding and agreed to proceed.  Interactive audio and video telecommunications were attempted between this nurse and patient, however failed, due to patient having technical difficulties OR patient did not have access to video capability.  We continued and completed visit with audio only.  Some vital signs may be absent or patient reported.   Kerry Cozort E Graciano Batson, LPN   Review of Systems     Cardiac Risk Factors include: advanced age (>3men, >39 women);male gender;hypertension;obesity (BMI >30kg/m2);sedentary lifestyle;smoking/ tobacco exposure;Other (see comment), Risk factor comments: COPD, anemia, ESRD, peripheral arterial disease, chronic heart failure, protein-calorie malnutrition     Objective:    Today's Vitals   11/22/21 0818  Weight: 186 lb (84.4 kg)  Height: 5\' 5"  (1.651 m)   Body mass index is 30.95 kg/m.  Advanced Directives 11/22/2021 09/15/2021 09/15/2021 06/29/2021 06/29/2021 11/22/2020 11/22/2020  Does Patient Have a Medical Advance Directive? Yes No No Yes Yes No No  Type of Advance Directive Living will - - Living will Living will - -  Does patient want to make changes to medical advance directive? - - - No - Patient declined - - -  Would patient like information on creating a medical advance directive? - No - Patient declined - - - No - Patient declined No -  Patient declined    Current Medications (verified) Outpatient Encounter Medications as of 11/22/2021  Medication Sig   amLODipine (NORVASC) 5 MG tablet Take 5 mg by mouth daily.   aspirin EC 325 MG tablet Take 325 mg by mouth at bedtime.   atorvastatin (LIPITOR) 40 MG tablet Take 1 tablet (40 mg total) by mouth daily.   B Complex-C-Folic Acid (DIALYVITE 751 PO) Take 0.8 mg by mouth at bedtime.   calcitRIOL (ROCALTROL) 0.25 MCG capsule Take 1 capsule (0.25 mcg total) by mouth Every Tuesday,Thursday,and Saturday with dialysis.   Calcium Carbonate 500 MG CHEW Chew 500 mg by mouth at bedtime.   Cholecalciferol (VITAMIN D3 PO) Take by mouth daily.   clopidogrel (PLAVIX) 75 MG tablet Take 1 tablet (75 mg total) by mouth daily with breakfast.   fluticasone furoate-vilanterol (BREO ELLIPTA) 200-25 MCG/INH AEPB Inhale 1 puff into the lungs daily.   heparin 1000 unit/mL SOLN injection Heparin Sodium (Porcine) 1,000 Units/mL Catheter Lock Arterial   Methoxy PEG-Epoetin Beta (MIRCERA IJ) Mircera   oxyCODONE-acetaminophen (PERCOCET) 5-325 MG tablet Take 1 tablet by mouth every 6 (six) hours as needed.   pantoprazole (PROTONIX) 40 MG tablet Take 1 tablet (40 mg total) by mouth daily.   sucroferric oxyhydroxide (VELPHORO) 500 MG chewable tablet Chew 1,000 mg by mouth 3 (three) times daily with meals.   tamsulosin (FLOMAX) 0.4 MG CAPS capsule Take 0.4 mg by mouth at bedtime.   albuterol (VENTOLIN HFA) 108 (90 Base) MCG/ACT inhaler Inhale 2 puffs into the lungs every 6 (six) hours as needed for wheezing or shortness of breath.   metoprolol tartrate (LOPRESSOR) 25  MG tablet Take 1 tablet (25 mg total) by mouth 2 (two) times daily.   tiotropium (SPIRIVA HANDIHALER) 18 MCG inhalation capsule Place 1 capsule (18 mcg total) into inhaler and inhale daily.   No facility-administered encounter medications on file as of 11/22/2021.    Allergies (verified) Strawberry (diagnostic) and Strawberry extract    History: Past Medical History:  Diagnosis Date   Anemia    CHF (congestive heart failure) (HCC)    COPD (chronic obstructive pulmonary disease) ([place])    Full dentures    Hypertension    Pneumonia    Pyelonephritis 09/11/2017   Renal disorder    Stroke [facility])    " A few mini strokes, I didn't know it "   Past Surgical History:  Procedure Laterality Date   ABDOMINAL AORTOGRAM W/LOWER EXTREMITY Bilateral 09/20/2021   Procedure: ABDOMINAL AORTOGRAM W/LOWER EXTREMITY;  Surgeon: Broadus John, MD;  Location: [place] [facility];  Service: Cardiovascular;  Laterality: Bilateral;   AMPUTATION TOE Right 09/22/2021   Procedure: PARTIAL AMPUTATION SECOND RIGHT TOE;  Surgeon: Marty Heck, MD;  Location: [place];  Service: Vascular;  Laterality: Right;   [place] Right 04/19/2020   Procedure: Basilic Vein Transposition Right Arm;  Surgeon: Waynetta Sandy, MD;  Location: [place];  Service: Vascular;  Laterality: Right;   COLON SURGERY     ENDARTERECTOMY FEMORAL Right 09/22/2021   Procedure: ENDARTERECTOMY FEMORAL WITH PATCH ANGIOPLASTY USING 1X6CM Weyman Pedro;  Surgeon: Marty Heck, MD;  Location: [place];  Service: Vascular;  Laterality: Right;   FEMORAL-TIBIAL BYPASS GRAFT Right 09/22/2021   Procedure: RIGHT FEMORAL TO BELOW KNEE POPLITEAL BYPASS WITH HARVEST OF GREATER SAPHENOUS VEIN;  Surgeon: Marty Heck, MD;  Location: [place];  Service: Vascular;  Laterality: Right;   INSERTION OF DIALYSIS CATHETER Right 04/19/2020   Procedure: Insertion Of Right Internal Jugular Dialysis Catheter;  Surgeon: Waynetta Sandy, MD;  Location: [facility];  Service: Vascular;  Laterality: Right;   MULTIPLE TOOTH EXTRACTIONS     PERIPHERAL VASCULAR BALLOON ANGIOPLASTY Right 09/20/2021   Procedure: PERIPHERAL VASCULAR BALLOON ANGIOPLASTY;  Surgeon: Broadus John, MD;  Location: [place] [facility];  Service: Cardiovascular;  Laterality: Right;  SFA   FAILED   Family History  Problem Relation Age of Onset   Alzheimer's disease Mother    Social History   Socioeconomic History   Marital status: Single    Spouse name: Not on file   Number of children: Not on file   Years of education: Not on file   Highest education level: Not on file  Occupational History   Occupation: retired  Tobacco Use   Smoking status: Every Day    Packs/day: 0.50    Years: 62.00    Pack years: 31.00    Types: Cigarettes   Smokeless tobacco: Former    Types: Chew   Tobacco comments:    Started smoking at age 44 - rolls his own tobacco cigarettes   Vaping Use   Vaping Use: Never used  Substance and Sexual Activity   Alcohol use: No   Drug use: Not Currently    Types: Marijuana   Sexual activity: Not Currently    Birth control/protection: None  Other Topics Concern   Not on file  Social History Narrative   Lives alone. No family nearby. He does get out and socialize with friends.    Doesn't have  a good relationship with his children   Social Determinants of  Health   Financial Resource Strain: Medium Risk   Difficulty of Paying Living Expenses: Somewhat hard  Food Insecurity: No Food Insecurity   Worried About Running Out of Food in the Last Year: Never true   Ran Out of Food in the Last Year: Never true  Transportation Needs: No Transportation Needs   Lack of Transportation (Medical): No   Lack of Transportation (Non-Medical): No  Physical Activity: Sufficiently Active   Days of Exercise per Week: 5 days   Minutes of Exercise per Session: 30 min  Stress: No Stress Concern Present   Feeling of Stress : Not at all  Social Connections: Socially Isolated   Frequency of Communication with Friends and Family: More than three times a week   Frequency of Social Gatherings with Friends and Family: More than three times a week   Attends Religious Services: Never   Marine scientist or Organizations: No   Attends Arts administrator: Never   Marital Status: Never married    Tobacco Counseling Ready to quit: No Counseling given: Yes Tobacco comments: Started smoking at age 78 - 28 his own tobacco cigarettes    Clinical Intake:  Pre-visit preparation completed: Yes  Pain : No/denies pain     BMI - recorded: 30.95 Nutritional Status: BMI > 30  Obese Nutritional Risks: None Diabetes: No  How often do you need to have someone help you when you read instructions, pamphlets, or other written materials from your doctor or pharmacy?: 1 - Never  Diabetic? no  Interpreter Needed?: No  Information entered by :: Kenzli Barritt, LPN   Activities of Daily Living In your present state of health, do you have any difficulty performing the following activities: 11/22/2021 09/15/2021  Hearing? N N  Vision? N N  Difficulty concentrating or making decisions? N N  Walking or climbing stairs? N N  Dressing or bathing? N N  Doing errands, shopping? Y N  Comment he doesn't have a car, sometimes uses daughter's vehicle - usually uses [employer] Facilities manager and eating ? N -  Using the Toilet? N -  In the past six months, have you accidently leaked urine? N -  Do you have problems with loss of bowel control? N -  Managing your Medications? N -  Managing your Finances? N -  Housekeeping or managing your Housekeeping? N -  Some recent data might be hidden    Patient Care Team: [name], FNP as PCP - General ([place] any recent Medical Services you may have received from other than [facility] providers in the past year (date may be approximate).     Assessment:   This is a routine wellness examination for [employer].  Hearing/Vision screen Hearing Screening - Comments:: Denies hearing difficulties  Vision Screening - Comments:: Wears reading glasses - no eye doctor at this time  Dietary issues and exercise activities discussed: Current Exercise Habits: Home  exercise routine, Type of exercise: walking;Other - see comments (chopping and carrying wood, working in yard), Time (Minutes): 30, Frequency (Times/Week): 5, Weekly Exercise (Minutes/Week): 150, Intensity: Moderate, Exercise limited by: respiratory conditions(s)   Goals Addressed             This Visit's Progress    Quit Smoking         Depression Screen PHQ 2/9 Scores 11/22/2021 08/23/2021 05/11/2020  PHQ - 2 Score 0 0 0  PHQ- 9 Score - 0 -  Fall Risk Fall Risk  11/22/2021 08/23/2021 05/11/2020  Falls in the past year? 0 0 0  Number falls in past yr: 0 - -  Injury with Fall? 0 - -  Risk for fall due to : Medication side effect;Other (Comment) - -  Risk for fall due to: Comment peripheral vascular disease - -  Follow up Falls prevention discussed - -    FALL RISK PREVENTION PERTAINING TO THE HOME:  Any stairs in or around the home? No  If so, are there any without handrails? No  Home free of loose throw rugs in walkways, pet beds, electrical cords, etc? Yes  Adequate lighting in your home to reduce risk of falls? Yes   ASSISTIVE DEVICES UTILIZED TO PREVENT FALLS:  Life alert? No  Use of a cane, walker or w/c? No  Grab bars in the bathroom? No  Shower chair or bench in shower? No  Elevated toilet seat or a handicapped toilet? No   TIMED UP AND GO:  Was the test performed? No . Telephonic visit  Cognitive Function:     6CIT Screen 11/22/2021  What Year? 0 points  What month? 0 points  What time? 0 points  Count back from 20 0 points  Months in reverse 4 points  Repeat phrase 2 points  Total Score 6    Immunizations Immunization History  Administered Date(s) Administered   Hepb-cpg 05/26/2020, 06/30/2020, 07/28/2020, 09/29/2020, 01/12/2021, 02/28/2021, 05/25/2021, 06/29/2021   Tdap 05/11/2017    TDAP status: Up to date  Flu Vaccine status: Declined, Education has been provided regarding the importance of this vaccine but patient still declined. Advised  may receive this vaccine at local pharmacy or Health Dept. Aware to provide a copy of the vaccination record if obtained from local pharmacy or Health Dept. Verbalized acceptance and understanding.  Pneumococcal vaccine status: Declined,  Education has been provided regarding the importance of this vaccine but patient still declined. Advised may receive this vaccine at local pharmacy or Health Dept. Aware to provide a copy of the vaccination record if obtained from local pharmacy or Health Dept. Verbalized acceptance and understanding.   Covid-19 vaccine status: Declined, Education has been provided regarding the importance of this vaccine but patient still declined. Advised may receive this vaccine at local pharmacy or Health Dept.or vaccine clinic. Aware to provide a copy of the vaccination record if obtained from local pharmacy or Health Dept. Verbalized acceptance and understanding.  Qualifies for Shingles Vaccine? Yes   Zostavax completed No   Shingrix Completed?: No.    Education has been provided regarding the importance of this vaccine. Patient has been advised to call insurance company to determine out of pocket expense if they have not yet received this vaccine. Advised may also receive vaccine at local pharmacy or Health Dept. Verbalized acceptance and understanding.  Screening Tests Health Maintenance  Topic Date Due   COVID-19 Vaccine (1) Never done   Pneumonia Vaccine 36+ Years old (1 - PCV) Never done   Zoster Vaccines- Shingrix (1 of 2) Never done   INFLUENZA VACCINE  03/09/2022 (Originally 07/10/2021)   COLONOSCOPY (Pts 45-66yrs Insurance coverage will need to be confirmed)  07/12/2022 (Originally 08/19/1994)   TETANUS/TDAP  05/12/2027   Hepatitis C Screening  Completed   HPV VACCINES  Aged Out    Health Maintenance  Health Maintenance Due  Topic Date Due   COVID-19 Vaccine (1) Never done   Pneumonia Vaccine 42+ Years old (1 - PCV) Never done  Zoster Vaccines- Shingrix (1  of 2) Never done    Colorectal cancer screening: No longer required.   Lung Cancer Screening: (Low Dose CT Chest recommended if Age 72-80 years, 30 pack-year currently smoking OR have quit w/in 15years.) does not qualify  Additional Screening:  Hepatitis C Screening: does qualify; Completed 04/25/2020  Vision Screening: Recommended annual ophthalmology exams for early detection of glaucoma and other disorders of the eye. Is the patient up to date with their annual eye exam?  No  Who is the provider or what is the name of the office in which the patient attends annual eye exams? none If pt is not established with a provider, would they like to be referred to a provider to establish care? No .   Dental Screening: Recommended annual dental exams for proper oral hygiene  Community Resource Referral / Chronic Care Management: CRR required this visit?  Yes   CCM required this visit?  No      Plan:     I have personally reviewed and noted the following in the patients chart:   Medical and social history Use of alcohol, tobacco or illicit drugs  Current medications and supplements including opioid prescriptions. Patient is currently taking opioid prescriptions. Information provided to patient regarding non-opioid alternatives. Patient advised to discuss non-opioid treatment plan with their provider. Functional ability and status Nutritional status Physical activity Advanced directives List of other physicians Hospitalizations, surgeries, and ER visits in previous 12 months Vitals Screenings to include cognitive, depression, and falls Referrals and appointments  In addition, I have reviewed and discussed with patient certain preventive protocols, quality metrics, and best practice recommendations. A written personalized care plan for preventive services as well as general preventive health recommendations were provided to patient.     Sandrea Hammond, LPN   [id number]   Nurse  Notes: CRR to assist with utiliies

## 2021-11-24 ENCOUNTER — Emergency Department (HOSPITAL_COMMUNITY): Payer: 59

## 2021-11-24 ENCOUNTER — Telehealth: Payer: Self-pay | Admitting: Family Medicine

## 2021-11-24 ENCOUNTER — Ambulatory Visit (INDEPENDENT_AMBULATORY_CARE_PROVIDER_SITE_OTHER): Payer: 59 | Admitting: Family Medicine

## 2021-11-24 ENCOUNTER — Inpatient Hospital Stay (HOSPITAL_COMMUNITY)
Admission: EM | Admit: 2021-11-24 | Discharge: 2021-11-28 | DRG: 193 | Disposition: A | Discharge status: Home / Self Care | Payer: 59 | Attending: Internal Medicine | Admitting: Internal Medicine

## 2021-11-24 ENCOUNTER — Other Ambulatory Visit: Payer: Self-pay

## 2021-11-24 ENCOUNTER — Encounter: Payer: Self-pay | Admitting: Family Medicine

## 2021-11-24 VITALS — BP 192/107 | HR 64 | Temp 97.6°F

## 2021-11-24 DIAGNOSIS — Z602 Problems related to living alone: Secondary | ICD-10-CM | POA: Diagnosis present

## 2021-11-24 DIAGNOSIS — B182 Chronic viral hepatitis C: Secondary | ICD-10-CM | POA: Diagnosis present

## 2021-11-24 DIAGNOSIS — D631 Anemia in chronic kidney disease: Secondary | ICD-10-CM | POA: Diagnosis present

## 2021-11-24 DIAGNOSIS — Z9109 Other allergy status, other than to drugs and biological substances: Secondary | ICD-10-CM

## 2021-11-24 DIAGNOSIS — I739 Peripheral vascular disease, unspecified: Secondary | ICD-10-CM | POA: Diagnosis present

## 2021-11-24 DIAGNOSIS — Z89421 Acquired absence of other right toe(s): Secondary | ICD-10-CM

## 2021-11-24 DIAGNOSIS — Z7902 Long term (current) use of antithrombotics/antiplatelets: Secondary | ICD-10-CM

## 2021-11-24 DIAGNOSIS — Z66 Do not resuscitate: Secondary | ICD-10-CM | POA: Diagnosis present

## 2021-11-24 DIAGNOSIS — Z820 Family history of epilepsy and other diseases of the nervous system: Secondary | ICD-10-CM

## 2021-11-24 DIAGNOSIS — J101 Influenza due to other identified influenza virus with other respiratory manifestations: Principal | ICD-10-CM | POA: Diagnosis present

## 2021-11-24 DIAGNOSIS — D696 Thrombocytopenia, unspecified: Secondary | ICD-10-CM | POA: Diagnosis present

## 2021-11-24 DIAGNOSIS — E875 Hyperkalemia: Secondary | ICD-10-CM | POA: Diagnosis present

## 2021-11-24 DIAGNOSIS — N186 End stage renal disease: Secondary | ICD-10-CM | POA: Diagnosis present

## 2021-11-24 DIAGNOSIS — I5032 Chronic diastolic (congestive) heart failure: Secondary | ICD-10-CM | POA: Diagnosis present

## 2021-11-24 DIAGNOSIS — I252 Old myocardial infarction: Secondary | ICD-10-CM | POA: Diagnosis not present

## 2021-11-24 DIAGNOSIS — I132 Hypertensive heart and chronic kidney disease with heart failure and with stage 5 chronic kidney disease, or end stage renal disease: Secondary | ICD-10-CM | POA: Diagnosis present

## 2021-11-24 DIAGNOSIS — R0603 Acute respiratory distress: Secondary | ICD-10-CM

## 2021-11-24 DIAGNOSIS — Z20822 Contact with and (suspected) exposure to covid-19: Secondary | ICD-10-CM | POA: Diagnosis present

## 2021-11-24 DIAGNOSIS — I151 Hypertension secondary to other renal disorders: Secondary | ICD-10-CM | POA: Diagnosis present

## 2021-11-24 DIAGNOSIS — J449 Chronic obstructive pulmonary disease, unspecified: Secondary | ICD-10-CM | POA: Diagnosis present

## 2021-11-24 DIAGNOSIS — F17210 Nicotine dependence, cigarettes, uncomplicated: Secondary | ICD-10-CM | POA: Diagnosis present

## 2021-11-24 DIAGNOSIS — K219 Gastro-esophageal reflux disease without esophagitis: Secondary | ICD-10-CM | POA: Diagnosis present

## 2021-11-24 DIAGNOSIS — Z91199 Patient's noncompliance with other medical treatment and regimen due to unspecified reason: Secondary | ICD-10-CM

## 2021-11-24 DIAGNOSIS — I214 Non-ST elevation (NSTEMI) myocardial infarction: Secondary | ICD-10-CM | POA: Diagnosis present

## 2021-11-24 DIAGNOSIS — J209 Acute bronchitis, unspecified: Secondary | ICD-10-CM | POA: Diagnosis present

## 2021-11-24 DIAGNOSIS — I248 Other forms of acute ischemic heart disease: Secondary | ICD-10-CM | POA: Diagnosis present

## 2021-11-24 DIAGNOSIS — J9601 Acute respiratory failure with hypoxia: Secondary | ICD-10-CM | POA: Diagnosis present

## 2021-11-24 DIAGNOSIS — R0602 Shortness of breath: Secondary | ICD-10-CM

## 2021-11-24 DIAGNOSIS — Z7982 Long term (current) use of aspirin: Secondary | ICD-10-CM

## 2021-11-24 DIAGNOSIS — I350 Nonrheumatic aortic (valve) stenosis: Secondary | ICD-10-CM | POA: Diagnosis present

## 2021-11-24 DIAGNOSIS — E871 Hypo-osmolality and hyponatremia: Secondary | ICD-10-CM | POA: Diagnosis present

## 2021-11-24 DIAGNOSIS — E785 Hyperlipidemia, unspecified: Secondary | ICD-10-CM | POA: Diagnosis present

## 2021-11-24 DIAGNOSIS — Z992 Dependence on renal dialysis: Secondary | ICD-10-CM | POA: Diagnosis not present

## 2021-11-24 DIAGNOSIS — J441 Chronic obstructive pulmonary disease with (acute) exacerbation: Secondary | ICD-10-CM | POA: Diagnosis present

## 2021-11-24 DIAGNOSIS — Z79899 Other long term (current) drug therapy: Secondary | ICD-10-CM

## 2021-11-24 DIAGNOSIS — Z8673 Personal history of transient ischemic attack (TIA), and cerebral infarction without residual deficits: Secondary | ICD-10-CM

## 2021-11-24 DIAGNOSIS — R0600 Dyspnea, unspecified: Secondary | ICD-10-CM

## 2021-11-24 LAB — BASIC METABOLIC PANEL
Anion gap: 15 (ref 5–15)
BUN: 41 mg/dL — ABNORMAL HIGH (ref 8–23)
CO2: 26 mmol/L (ref 22–32)
Calcium: 7.6 mg/dL — ABNORMAL LOW (ref 8.9–10.3)
Chloride: 92 mmol/L — ABNORMAL LOW (ref 98–111)
Creatinine, Ser: 6.65 mg/dL — ABNORMAL HIGH (ref 0.61–1.24)
GFR, Estimated: 8 mL/min — ABNORMAL LOW (ref >60)
Glucose, Bld: 124 mg/dL — ABNORMAL HIGH (ref 70–99)
Potassium: 5.3 mmol/L — ABNORMAL HIGH (ref 3.5–5.1)
Sodium: 133 mmol/L — ABNORMAL LOW (ref 135–145)

## 2021-11-24 LAB — CBC WITH DIFFERENTIAL/PLATELET
Abs Immature Granulocytes: 0.01 10*3/uL (ref 0.00–0.07)
Basophils Absolute: 0 10*3/uL (ref 0.0–0.1)
Basophils Relative: 0 %
Eosinophils Absolute: 0 10*3/uL (ref 0.0–0.5)
Eosinophils Relative: 0 %
HCT: 48.5 % (ref 39.0–52.0)
Hemoglobin: 14.6 g/dL (ref 13.0–17.0)
Immature Granulocytes: 0 %
Lymphocytes Relative: 7 %
Lymphs Abs: 0.5 10*3/uL — ABNORMAL LOW (ref 0.7–4.0)
MCH: 28.9 pg (ref 26.0–34.0)
MCHC: 30.1 g/dL (ref 30.0–36.0)
MCV: 96 fL (ref 80.0–100.0)
Monocytes Absolute: 0.4 10*3/uL (ref 0.1–1.0)
Monocytes Relative: 6 %
Neutro Abs: 5.9 10*3/uL (ref 1.7–7.7)
Neutrophils Relative %: 87 %
Platelets: 154 10*3/uL (ref 150–400)
RBC: 5.05 MIL/uL (ref 4.22–5.81)
RDW: 17.2 % — ABNORMAL HIGH (ref 11.5–15.5)
WBC: 6.8 10*3/uL (ref 4.0–10.5)
nRBC: 0 % (ref 0.0–0.2)

## 2021-11-24 LAB — RESP PANEL BY RT-PCR (FLU A&B, COVID) ARPGX2
Influenza A by PCR: POSITIVE — AB
Influenza B by PCR: NEGATIVE
SARS Coronavirus 2 by RT PCR: NEGATIVE

## 2021-11-24 LAB — TROPONIN I (HIGH SENSITIVITY)
Troponin I (High Sensitivity): 540 ng/L (ref <18)
Troponin I (High Sensitivity): 546 ng/L (ref <18)

## 2021-11-24 MED ORDER — HEPARIN (PORCINE) 25000 UT/250ML-% IV SOLN
1200.0000 [IU]/h | INTRAVENOUS | Status: DC
  Administered 2021-11-24: 20:00:00 1050 [IU]/h via INTRAVENOUS
  Administered 2021-11-25: 1200 [IU]/h via INTRAVENOUS
  Filled 2021-11-24 (×2): qty 250

## 2021-11-24 MED ORDER — HEPARIN BOLUS VIA INFUSION
4000.0000 [IU] | Freq: Once | INTRAVENOUS | Status: AC
  Administered 2021-11-24: 4000 [IU] via INTRAVENOUS

## 2021-11-24 MED ORDER — ASPIRIN 81 MG PO CHEW
324.0000 mg | CHEWABLE_TABLET | Freq: Once | ORAL | Status: AC
  Administered 2021-11-24: 324 mg via ORAL
  Filled 2021-11-24: qty 4

## 2021-11-24 MED ORDER — ACETAMINOPHEN 325 MG PO TABS
650.0000 mg | ORAL_TABLET | Freq: Once | ORAL | Status: AC
  Administered 2021-11-24: 650 mg via ORAL
  Filled 2021-11-24: qty 2

## 2021-11-24 MED ORDER — METHYLPREDNISOLONE SODIUM SUCC 125 MG IJ SOLR
125.0000 mg | Freq: Once | INTRAMUSCULAR | Status: AC
  Administered 2021-11-24: 125 mg via INTRAVENOUS
  Filled 2021-11-24: qty 2

## 2021-11-24 MED ORDER — ALBUTEROL SULFATE (2.5 MG/3ML) 0.083% IN NEBU: 2.5000 mg | INHALATION_SOLUTION | Freq: Once | RESPIRATORY_TRACT | Status: AC

## 2021-11-24 MED ORDER — ALBUTEROL SULFATE (2.5 MG/3ML) 0.083% IN NEBU
INHALATION_SOLUTION | RESPIRATORY_TRACT | Status: AC
  Administered 2021-11-24: 2.5 mg via RESPIRATORY_TRACT
  Filled 2021-11-24: qty 3

## 2021-11-24 MED ORDER — IPRATROPIUM-ALBUTEROL 0.5-2.5 (3) MG/3ML IN SOLN
3.0000 mL | Freq: Once | RESPIRATORY_TRACT | Status: AC
  Administered 2021-11-24: 3 mL via RESPIRATORY_TRACT
  Filled 2021-11-24: qty 3

## 2021-11-24 MED ORDER — OSELTAMIVIR PHOSPHATE 30 MG PO CAPS
30.0000 mg | ORAL_CAPSULE | Freq: Once | ORAL | Status: AC
  Administered 2021-11-24: 30 mg via ORAL
  Filled 2021-11-24: qty 1

## 2021-11-24 NOTE — Telephone Encounter (Signed)
I will see him this afternoon.

## 2021-11-24 NOTE — ED Notes (Signed)
EDP at bedside  

## 2021-11-24 NOTE — Progress Notes (Addendum)
ANTICOAGULATION CONSULT NOTE - Initial Consult  Pharmacy Consult for heparin Indication: chest pain/ACS  Allergies  Allergen Reactions   Strawberry (Diagnostic)    Strawberry Extract     Other reaction(s): Unknown    Patient Measurements: Height: 5\' 5"  (165.1 cm) Weight: 81.6 kg (180 lb) IBW/kg (Calculated) : 61.5 Heparin Dosing Weight: 78kg  Vital Signs: Temp: 100.2 F (37.9 C) (12/16 1545) Temp Source: Temporal (12/16 1413) BP: 128/73 (12/16 1800) Pulse Rate: 86 (12/16 1800)  Labs: Recent Labs    11/24/21 1620 11/24/21 1749  HGB 14.6  --   HCT 48.5  --   PLT 154  --   CREATININE 6.65*  --   TROPONINIHS 540* 546*    Estimated Creatinine Clearance: 9.9 mL/min (A) (by C-G formula based on SCr of 6.65 mg/dL (H)).   Medical History: Past Medical History:  Diagnosis Date   Anemia    CHF (congestive heart failure) (HCC)    COPD (chronic obstructive pulmonary disease) ([place])    Full dentures    Hypertension    Pneumonia    Pyelonephritis 09/11/2017   Renal disorder    Stroke [facility])    " A few mini strokes, I didn't know it "    Medications:  (Not in a hospital admission)  Scheduled:   heparin  4,000 Units Intravenous Once   Infusions:   heparin      Assessment: Pt with ESRD who presented with short of breath and CP. Heparin has been ordered to r/o MI. Pt is not on anticoag prior to admission.   Goal of Therapy:  Heparin level 0.3-0.7 units/ml Monitor platelets by anticoagulation protocol: Yes   Plan:  Heparin bolus 4000 units x1 Heparin infusion 1050units/hr Heparin level in AM Daily HL and CBC  Onnie Boer, PharmD, BCIDP, AAHIVP, CPP Infectious Disease Pharmacist 11/24/2021 7:28 PM

## 2021-11-24 NOTE — ED Provider Notes (Signed)
[facility] Provider Note  CSN: [id number] Arrival date & time: 11/24/21 1509    History Chief Complaint  Patient presents with   Shortness of Breath    Kerry Underwood is a 70 y.o. male with history of CHF, COPD ESRD on HD TTS (went yesterday, no missed sessions). He has had increasing SOB since yesterday, no cough, no chest pain. He had a fever at home.He feels tight like prior COPD. He uses inhalers at home but does not use nebs. He is complaining of moderate diffuse headache. He went to PCP office where he was reported hypoxic to the 60s on RA but improved with supplemental oxygen. He does not wear oxygen at home.    Past Medical History:  Diagnosis Date   Anemia    CHF (congestive heart failure) (HCC)    COPD (chronic obstructive pulmonary disease) ([place])    Full dentures    Hypertension    Pneumonia    Pyelonephritis 09/11/2017   Renal disorder    Stroke [facility])    " A few mini strokes, I didn't know it "    Past Surgical History:  Procedure Laterality Date   ABDOMINAL AORTOGRAM W/LOWER EXTREMITY Bilateral 09/20/2021   Procedure: ABDOMINAL AORTOGRAM W/LOWER EXTREMITY;  Surgeon: Broadus John, MD;  Location: [place] [facility];  Service: Cardiovascular;  Laterality: Bilateral;   AMPUTATION TOE Right 09/22/2021   Procedure: PARTIAL AMPUTATION SECOND RIGHT TOE;  Surgeon: Marty Heck, MD;  Location: [place];  Service: Vascular;  Laterality: Right;   [place] Right 04/19/2020   Procedure: Basilic Vein Transposition Right Arm;  Surgeon: Waynetta Sandy, MD;  Location: [place];  Service: Vascular;  Laterality: Right;   COLON SURGERY     ENDARTERECTOMY FEMORAL Right 09/22/2021   Procedure: ENDARTERECTOMY FEMORAL WITH PATCH ANGIOPLASTY USING 1X6CM Weyman Pedro;  Surgeon: Marty Heck, MD;  Location: [place];  Service: Vascular;  Laterality: Right;   FEMORAL-TIBIAL BYPASS GRAFT Right 09/22/2021   Procedure: RIGHT  FEMORAL TO BELOW KNEE POPLITEAL BYPASS WITH HARVEST OF GREATER SAPHENOUS VEIN;  Surgeon: Marty Heck, MD;  Location: [place];  Service: Vascular;  Laterality: Right;   INSERTION OF DIALYSIS CATHETER Right 04/19/2020   Procedure: Insertion Of Right Internal Jugular Dialysis Catheter;  Surgeon: Waynetta Sandy, MD;  Location: [facility] OR;  Service: Vascular;  Laterality: Right;   MULTIPLE TOOTH EXTRACTIONS     PERIPHERAL VASCULAR BALLOON ANGIOPLASTY Right 09/20/2021   Procedure: PERIPHERAL VASCULAR BALLOON ANGIOPLASTY;  Surgeon: Broadus John, MD;  Location: [place] [facility];  Service: Cardiovascular;  Laterality: Right;  SFA  FAILED    Family History  Problem Relation Age of Onset   Alzheimer's disease Mother     Social History   Tobacco Use   Smoking status: Every Day    Packs/day: 0.50    Years: 62.00    Pack years: 31.00    Types: Cigarettes   Smokeless tobacco: Former    Types: Chew   Tobacco comments:    Started smoking at age 44 - 64 his own tobacco cigarettes   Vaping Use   Vaping Use: Never used  Substance Use Topics   Alcohol use: No   Drug use: Not Currently    Types: Marijuana     Home Medications Prior to Admission medications   Medication Sig Start Date End Date Taking? Authorizing Provider  albuterol (VENTOLIN HFA) 108 (90 Base) MCG/ACT inhaler Inhale 2 puffs into the lungs  every 6 (six) hours as needed for wheezing or shortness of breath. 07/02/21 09/15/21  Barton Dubois, MD  amLODipine (NORVASC) 5 MG tablet Take 5 mg by mouth daily. 09/25/21   [provider]  aspirin EC 325 MG tablet Take 325 mg by mouth at bedtime.    [provider]  atorvastatin (LIPITOR) 40 MG tablet Take 1 tablet (40 mg total) by mouth daily. 09/26/21   Ghimire, Henreitta Leber, MD  B Complex-C-Folic Acid (DIALYVITE 009 PO) Take 0.8 mg by mouth at bedtime.    [provider]  calcitRIOL (ROCALTROL) 0.25 MCG capsule Take 1 capsule (0.25 mcg total) by  mouth Every Tuesday,Thursday,and Saturday with dialysis. 09/26/21   Ghimire, Henreitta Leber, MD  Calcium Carbonate 500 MG CHEW Chew 500 mg by mouth at bedtime.    [provider]  Cholecalciferol (VITAMIN D3 PO) Take by mouth daily.    [provider]  clopidogrel (PLAVIX) 75 MG tablet Take 1 tablet (75 mg total) by mouth daily with breakfast. 09/26/21   Ghimire, Henreitta Leber, MD  fluticasone furoate-vilanterol (BREO ELLIPTA) 200-25 MCG/INH AEPB Inhale 1 puff into the lungs daily. 07/02/21   Barton Dubois, MD  heparin 1000 unit/mL SOLN injection Heparin Sodium (Porcine) 1,000 Units/mL Catheter Lock Arterial 10/05/21 10/04/22  [provider]  Methoxy PEG-Epoetin Beta (MIRCERA IJ) Mircera 09/28/21 09/27/22  [provider]  metoprolol tartrate (LOPRESSOR) 25 MG tablet Take 1 tablet (25 mg total) by mouth 2 (two) times daily. 09/25/21 10/25/21  Ghimire, Henreitta Leber, MD  oxyCODONE-acetaminophen (PERCOCET) 5-325 MG tablet Take 1 tablet by mouth every 6 (six) hours as needed. 10/16/21   Rhyne, Hulen Shouts, PA-C  pantoprazole (PROTONIX) 40 MG tablet Take 1 tablet (40 mg total) by mouth daily. 09/26/21   Ghimire, Henreitta Leber, MD  sucroferric oxyhydroxide (VELPHORO) 500 MG chewable tablet Chew 1,000 mg by mouth 3 (three) times daily with meals.    [provider]  tamsulosin (FLOMAX) 0.4 MG CAPS capsule Take 0.4 mg by mouth at bedtime. 05/24/20   [provider]  tiotropium (SPIRIVA HANDIHALER) 18 MCG inhalation capsule Place 1 capsule (18 mcg total) into inhaler and inhale daily. 07/02/21 09/15/21  Barton Dubois, MD     Allergies    Strawberry (diagnostic) and Strawberry extract   Review of Systems   Review of Systems A comprehensive review of systems was completed and negative except as noted in HPI.    Physical Exam BP 128/73    Pulse 86    Temp 100.2 F (37.9 C)    Resp (!) 22    Ht 5\' 5"  (1.651 m)    Wt 81.6 kg    SpO2 95%    BMI 29.95 kg/m   Physical  Exam Vitals and nursing note reviewed.  Constitutional:      Appearance: Normal appearance.  HENT:     Head: Normocephalic and atraumatic.     Nose: Nose normal.     Mouth/Throat:     Mouth: Mucous membranes are moist.  Eyes:     Extraocular Movements: Extraocular movements intact.     Conjunctiva/sclera: Conjunctivae normal.  Cardiovascular:     Rate and Rhythm: Normal rate.  Pulmonary:     Effort: Pulmonary effort is normal. No tachypnea.     Breath sounds: Decreased breath sounds and wheezing present.     Comments: Dialysis catheter in R upper chest Abdominal:     General: Abdomen is flat.     Palpations: Abdomen is soft.  Tenderness: There is no abdominal tenderness.  Musculoskeletal:        General: No swelling. Normal range of motion.     Cervical back: Neck supple.  Skin:    General: Skin is warm and dry.  Neurological:     General: No focal deficit present.     Mental Status: He is alert.  Psychiatric:        Mood and Affect: Mood normal.     ED Results / Procedures / Treatments   Labs (all labs ordered are listed, but only abnormal results are displayed) Labs Reviewed  RESP PANEL BY RT-PCR (FLU A&B, COVID) ARPGX2 - Abnormal; Notable for the following components:      Result Value   Influenza A by PCR POSITIVE (*)    All other components within normal limits  BASIC METABOLIC PANEL - Abnormal; Notable for the following components:   Sodium 133 (*)    Potassium 5.3 (*)    Chloride 92 (*)    Glucose, Bld 124 (*)    BUN 41 (*)    Creatinine, Ser 6.65 (*)    Calcium 7.6 (*)    GFR, Estimated 8 (*)    All other components within normal limits  CBC WITH DIFFERENTIAL/PLATELET - Abnormal; Notable for the following components:   RDW 17.2 (*)    Lymphs Abs 0.5 (*)    All other components within normal limits  TROPONIN I (HIGH SENSITIVITY) - Abnormal; Notable for the following components:   Troponin I (High Sensitivity) 540 (*)    All other components within  normal limits  TROPONIN I (HIGH SENSITIVITY)    EKG EKG Interpretation  Date/Time:  Friday November 24 2021 15:36:36 EST Ventricular Rate:  96 PR Interval:  158 QRS Duration: 78 QT Interval:  353 QTC Calculation: 447 R Axis:   79 Text Interpretation: Sinus rhythm LAE, consider biatrial enlargement Probable anteroseptal infarct, old Since last tracing Premature ventricular complexes NO LONGER PRESENT Confirmed by Calvert Cantor [phone]) on 11/24/2021 4:11:52 PM  Radiology DG Chest Port 1 View  Result Date: 11/24/2021 CLINICAL DATA:  Chest pain with shortness of breath. EXAM: PORTABLE CHEST 1 VIEW COMPARISON:  Chest x-ray 08/23/2021. FINDINGS: The heart is mildly enlarged, unchanged. There is pulmonary vascular congestion. There is a small right pleural effusion. No pneumothorax. Right-sided central venous catheter tip projects over the mid SVC. There is a healed left clavicular fracture. IMPRESSION: 1. Cardiomegaly with central pulmonary vascular congestion. 2. Small right pleural effusion. Electronically Signed   By: Ronney Asters M.D.   On: 11/24/2021 16:09    Procedures .Critical Care Performed by: Truddie Hidden, MD Authorized by: Truddie Hidden, MD   Critical care provider statement:    Critical care time (minutes):  45   Critical care time was exclusive of:  Separately billable procedures and treating other patients   Critical care was necessary to treat or prevent imminent or life-threatening deterioration of the following conditions:  Cardiac failure, renal failure and respiratory failure   Critical care was time spent personally by me on the following activities:  Development of treatment plan with patient or surrogate, discussions with consultants, evaluation of patient's response to treatment, examination of patient, ordering and review of laboratory studies, ordering and review of radiographic studies, ordering and performing treatments and interventions, pulse  oximetry, re-evaluation of patient's condition and review of old charts  Medications Ordered in the ED Medications  oseltamivir (TAMIFLU) capsule 30 mg (has no administration in time range)  acetaminophen (TYLENOL) tablet 650 mg (650 mg Oral Given 11/24/21 1620)  ipratropium-albuterol (DUONEB) 0.5-2.5 (3) MG/3ML nebulizer solution 3 mL (3 mLs Nebulization Given 11/24/21 1615)  methylPREDNISolone sodium succinate (SOLU-MEDROL) 125 mg/2 mL injection 125 mg (125 mg Intravenous Given 11/24/21 1621)  albuterol (PROVENTIL) (2.5 MG/3ML) 0.083% nebulizer solution 2.5 mg (2.5 mg Nebulization Given 11/24/21 1614)  aspirin chewable tablet 324 mg (324 mg Oral Given 11/24/21 1803)     MDM Rules/Calculators/A&P MDM   ED Course  I have reviewed the triage vital signs and the nursing notes.  Pertinent labs & imaging results that were available during my care of the patient were reviewed by me and considered in my medical decision making (see chart for details).  Clinical Course as of 11/24/21 1807  Fri Nov 24, 2021  1643 CBC is normal. CXR with vascular congestion, no infiltrates [CS]  1724 BMP is consistent with ESRD. First Trop is elevated. He is not having any CP but could be anginal equivalent. Will discuss with Cardiology.  [CS]  1610 Influenza is positive which may account for his respiratory symptoms.  [CS]  9604 Spoke with Dr. Marisue Ivan, Cardiology, who requests the hospitalist admit him at [facility] with ASA and heparin drip. Spoke with Dr. Nehemiah Settle, Hospitalist who will evaluate in the ED for admission to [facility].  [CS]    Clinical Course User Index [CS] Truddie Hidden, MD    Final Clinical Impression(s) / ED Diagnoses Final diagnoses:  Influenza A  Acute respiratory failure with hypoxia ([place])  ESRD on hemodialysis [facility])  NSTEMI (non-ST elevated myocardial infarction) [facility])    Rx / DC Orders ED Discharge Orders     None        Truddie Hidden, MD 11/24/21 1807

## 2021-11-24 NOTE — ED Notes (Signed)
[facility] onsite to transport pt at this time.  

## 2021-11-24 NOTE — ED Triage Notes (Signed)
Sent from PCP for SOB sats in the 60s on RA went up to 100 on 4l nasal cannula

## 2021-11-24 NOTE — ED Notes (Signed)
Critical Troponin 540 Dr. Karle Starch made aware.

## 2021-11-24 NOTE — H&P (Signed)
History and Physical  Basem Yannuzzi OQH:[id number] DOB: 1951-08-17 DOA: 11/24/2021  Referring physician: Dr Karle Starch, ED physician PCP: [name], FNP  Outpatient Specialists:   Patient Coming From: home  Chief Complaint: SOB, fatigue  HPI: Kerry Underwood is a 70 y.o. male with a history of heart failure with unknown ejection fraction, COPD, hypertension, end-stage renal disease on hemodialysis Tuesday, Thursday, Saturday.  Patient complains of increasing shortness of breath since yesterday with a cough that started today.  He did have a fever at home yesterday as well he has been feeling like his airway is tight like his prior COPD's.  He has been using his inhalers at home but not his nebulizer treatments.  He went to his primary care physician's office and was found to be hypoxic to 60 room air.  He was placed on supplemental oxygen which immediately improved his oxygenation.  He denies chest pain, orthopnea.  He has not missed any doses of his hemodialysis and he went yesterday.  No palliating or provoking factors.  Emergency Department Course: Troponin elevated to 540 and is 546 on repeat.  Patient was discussed with cardiology who recommended starting heparin drip and transported to [facility] for evaluation.  His checks x-ray did show mild vascular congestion.  Review of Systems:   Pt denies any nausea, vomiting, diarrhea, constipation, abdominal pain, dyspnea on exertion, orthopnea, cough, wheezing, palpitations, headache, vision changes, lightheadedness, dizziness, melena, rectal bleeding.  Review of systems are otherwise negative  Past Medical History:  Diagnosis Date   Anemia    CHF (congestive heart failure) (HCC)    COPD (chronic obstructive pulmonary disease) ([place])    Full dentures    Hypertension    Pneumonia    Pyelonephritis 09/11/2017   Renal disorder    Stroke [facility])    " A few mini strokes, I didn't know it "   Past Surgical History:  Procedure  Laterality Date   ABDOMINAL AORTOGRAM W/LOWER EXTREMITY Bilateral 09/20/2021   Procedure: ABDOMINAL AORTOGRAM W/LOWER EXTREMITY;  Surgeon: Broadus John, MD;  Location: [place] [facility];  Service: Cardiovascular;  Laterality: Bilateral;   AMPUTATION TOE Right 09/22/2021   Procedure: PARTIAL AMPUTATION SECOND RIGHT TOE;  Surgeon: Marty Heck, MD;  Location: [employer];  Service: Vascular;  Laterality: Right;   Britt Right 04/19/2020   Procedure: Basilic Vein Transposition Right Arm;  Surgeon: Waynetta Sandy, MD;  Location: [place];  Service: Vascular;  Laterality: Right;   COLON SURGERY     ENDARTERECTOMY FEMORAL Right 09/22/2021   Procedure: ENDARTERECTOMY FEMORAL WITH PATCH ANGIOPLASTY USING 1X6CM Weyman Pedro;  Surgeon: Marty Heck, MD;  Location: [place];  Service: Vascular;  Laterality: Right;   FEMORAL-TIBIAL BYPASS GRAFT Right 09/22/2021   Procedure: RIGHT FEMORAL TO BELOW KNEE POPLITEAL BYPASS WITH HARVEST OF GREATER SAPHENOUS VEIN;  Surgeon: Marty Heck, MD;  Location: [place];  Service: Vascular;  Laterality: Right;   INSERTION OF DIALYSIS CATHETER Right 04/19/2020   Procedure: Insertion Of Right Internal Jugular Dialysis Catheter;  Surgeon: Waynetta Sandy, MD;  Location: [facility];  Service: Vascular;  Laterality: Right;   MULTIPLE TOOTH EXTRACTIONS     PERIPHERAL VASCULAR BALLOON ANGIOPLASTY Right 09/20/2021   Procedure: PERIPHERAL VASCULAR BALLOON ANGIOPLASTY;  Surgeon: Broadus John, MD;  Location: [place] [facility];  Service: Cardiovascular;  Laterality: Right;  SFA  FAILED   Social History:  reports that he has been smoking cigarettes. He has a 31.00 pack-year  smoking history. He has quit using smokeless tobacco.  His smokeless tobacco use included chew. He reports that he does not currently use drugs after having used the following drugs: Marijuana. He reports that he does not drink alcohol. Patient lives at  home  Allergies  Allergen Reactions   Strawberry (Diagnostic)    Strawberry Extract     Other reaction(s): Unknown    Family History  Problem Relation Age of Onset   Alzheimer's disease Mother       Prior to Admission medications   Medication Sig Start Date End Date Taking? Authorizing Provider  albuterol (VENTOLIN HFA) 108 (90 Base) MCG/ACT inhaler Inhale 2 puffs into the lungs every 6 (six) hours as needed for wheezing or shortness of breath. 07/02/21 09/15/21  Barton Dubois, MD  amLODipine (NORVASC) 5 MG tablet Take 5 mg by mouth daily. 09/25/21   [provider]  aspirin EC 325 MG tablet Take 325 mg by mouth at bedtime.    [provider]  atorvastatin (LIPITOR) 40 MG tablet Take 1 tablet (40 mg total) by mouth daily. 09/26/21   Ghimire, Henreitta Leber, MD  B Complex-C-Folic Acid (DIALYVITE 841 PO) Take 0.8 mg by mouth at bedtime.    [provider]  calcitRIOL (ROCALTROL) 0.25 MCG capsule Take 1 capsule (0.25 mcg total) by mouth Every Tuesday,Thursday,and Saturday with dialysis. 09/26/21   Ghimire, Henreitta Leber, MD  Calcium Carbonate 500 MG CHEW Chew 500 mg by mouth at bedtime.    [provider]  Cholecalciferol (VITAMIN D3 PO) Take by mouth daily.    [provider]  clopidogrel (PLAVIX) 75 MG tablet Take 1 tablet (75 mg total) by mouth daily with breakfast. 09/26/21   Ghimire, Henreitta Leber, MD  fluticasone furoate-vilanterol (BREO ELLIPTA) 200-25 MCG/INH AEPB Inhale 1 puff into the lungs daily. 07/02/21   Barton Dubois, MD  heparin 1000 unit/mL SOLN injection Heparin Sodium (Porcine) 1,000 Units/mL Catheter Lock Arterial 10/05/21 10/04/22  [provider]  Methoxy PEG-Epoetin Beta (MIRCERA IJ) Mircera 09/28/21 09/27/22  [provider]  metoprolol tartrate (LOPRESSOR) 25 MG tablet Take 1 tablet (25 mg total) by mouth 2 (two) times daily. 09/25/21 10/25/21  Ghimire, Henreitta Leber, MD  oxyCODONE-acetaminophen (PERCOCET) 5-325 MG tablet  Take 1 tablet by mouth every 6 (six) hours as needed. 10/16/21   Rhyne, Hulen Shouts, PA-C  pantoprazole (PROTONIX) 40 MG tablet Take 1 tablet (40 mg total) by mouth daily. 09/26/21   Ghimire, Henreitta Leber, MD  sucroferric oxyhydroxide (VELPHORO) 500 MG chewable tablet Chew 1,000 mg by mouth 3 (three) times daily with meals.    [provider]  tamsulosin (FLOMAX) 0.4 MG CAPS capsule Take 0.4 mg by mouth at bedtime. 05/24/20   [provider]  tiotropium (SPIRIVA HANDIHALER) 18 MCG inhalation capsule Place 1 capsule (18 mcg total) into inhaler and inhale daily. 07/02/21 09/15/21  Barton Dubois, MD    Physical Exam: BP 128/73    Pulse 86    Temp 100.2 F (37.9 C)    Resp (!) 22    Ht 5\' 5"  (1.651 m)    Wt 81.6 kg    SpO2 95%    BMI 29.95 kg/m   General: Elderly male. Awake and alert and oriented x3. No acute cardiopulmonary distress.  HEENT: Normocephalic atraumatic.  Right and left ears normal in appearance.  Pupils equal, round, reactive to light. Extraocular muscles are intact. Sclerae anicteric and noninjected.  Moist mucosal membranes. No mucosal lesions.  Neck: Neck supple without  lymphadenopathy. No carotid bruits. No masses palpated.  Cardiovascular: Regular rate with normal S1-S2 sounds. No murmurs, rubs, gallops auscultated.  Increased JVD.  Respiratory: Mild wheezes throughout.  No accessory muscle use. Abdomen: Soft, nontender, nondistended. Active bowel sounds. No masses or hepatosplenomegaly  Skin: No rashes, lesions, or ulcerations.  Dry, warm to touch. 2+ dorsalis pedis and radial pulses. Musculoskeletal: No calf or leg pain. All major joints not erythematous nontender.  No upper or lower joint deformation.  Good ROM.  No contractures  Psychiatric: Intact judgment and insight. Pleasant and cooperative. Neurologic: No focal neurological deficits. Strength is 5/5 and symmetric in upper and lower extremities.  Cranial nerves II through XII are grossly intact.            Labs on Admission: I have personally reviewed following labs and imaging studies  CBC: Recent Labs  Lab 11/24/21 1620  WBC 6.8  NEUTROABS 5.9  HGB 14.6  HCT 48.5  MCV 96.0  PLT 338   Basic Metabolic Panel: Recent Labs  Lab 11/24/21 1620  NA 133*  K 5.3*  CL 92*  CO2 26  GLUCOSE 124*  BUN 41*  CREATININE 6.65*  CALCIUM 7.6*   GFR: Estimated Creatinine Clearance: 9.9 mL/min (A) (by C-G formula based on SCr of 6.65 mg/dL (H)). Liver Function Tests: No results for input(s): AST, ALT, ALKPHOS, BILITOT, PROT, ALBUMIN in the last 168 hours. No results for input(s): LIPASE, AMYLASE in the last 168 hours. No results for input(s): AMMONIA in the last 168 hours. Coagulation Profile: No results for input(s): INR, PROTIME in the last 168 hours. Cardiac Enzymes: No results for input(s): CKTOTAL, CKMB, CKMBINDEX, TROPONINI in the last 168 hours. BNP (last 3 results) No results for input(s): PROBNP in the last 8760 hours. HbA1C: No results for input(s): HGBA1C in the last 72 hours. CBG: No results for input(s): GLUCAP in the last 168 hours. Lipid Profile: No results for input(s): CHOL, HDL, LDLCALC, TRIG, CHOLHDL, LDLDIRECT in the last 72 hours. Thyroid Function Tests: No results for input(s): TSH, T4TOTAL, FREET4, T3FREE, THYROIDAB in the last 72 hours. Anemia Panel: No results for input(s): VITAMINB12, FOLATE, FERRITIN, TIBC, IRON, RETICCTPCT in the last 72 hours. Urine analysis:    Component Value Date/Time   COLORURINE YELLOW 04/15/2020 2200   APPEARANCEUR TURBID (A) 04/15/2020 2200   LABSPEC 1.010 04/15/2020 2200   PHURINE 6.0 04/15/2020 2200   GLUCOSEU NEGATIVE 04/15/2020 2200   HGBUR SMALL (A) 04/15/2020 2200   BILIRUBINUR NEGATIVE 04/15/2020 2200   KETONESUR NEGATIVE 04/15/2020 2200   PROTEINUR 100 (A) 04/15/2020 2200   NITRITE POSITIVE (A) 04/15/2020 2200   LEUKOCYTESUR LARGE (A) 04/15/2020 2200   Sepsis  Labs: @LABRCNTIP (procalcitonin:4,lacticidven:4) ) Recent Results (from the past 240 hour(s))  Resp Panel by RT-PCR (Flu A&B, Covid)     Status: Abnormal   Collection Time: 11/24/21  4:20 PM  Result Value Ref Range Status   SARS Coronavirus 2 by RT PCR NEGATIVE NEGATIVE Final    Comment: (NOTE) SARS-CoV-2 target nucleic acids are NOT DETECTED.  The SARS-CoV-2 RNA is generally detectable in upper respiratory specimens during the acute phase of infection. The lowest concentration of SARS-CoV-2 viral copies this assay can detect is 138 copies/mL. A negative result does not preclude SARS-Cov-2 infection and should not be used as the sole basis for treatment or other patient management decisions. A negative result may occur with  improper specimen collection/handling, submission of specimen other than nasopharyngeal swab, presence of viral mutation(s) within the areas  targeted by this assay, and inadequate number of viral copies(<138 copies/mL). A negative result must be combined with clinical observations, patient history, and epidemiological information. The expected result is Negative.  Fact Sheet for Patients:  EntrepreneurPulse.com.au  Fact Sheet for Healthcare Providers:  IncredibleEmployment.be  This test is no t yet approved or cleared by the [place] FDA and  has been authorized for detection and/or diagnosis of SARS-CoV-2 by FDA under an Emergency Use Authorization (EUA). This EUA will remain  in effect (meaning this test can be used) for the duration of the COVID-19 declaration under Section 564(b)(1) of the Act, 21 U.S.C.section 360bbb-3(b)(1), unless the authorization is terminated  or revoked sooner.       Influenza A by PCR POSITIVE (A) NEGATIVE Final   Influenza B by PCR NEGATIVE NEGATIVE Final    Comment: (NOTE) The Xpert Xpress SARS-CoV-2/FLU/RSV plus assay is intended as an aid in the diagnosis of influenza from  Nasopharyngeal swab specimens and should not be used as a sole basis for treatment. Nasal washings and aspirates are unacceptable for Xpert Xpress SARS-CoV-2/FLU/RSV testing.  Fact Sheet for Patients: EntrepreneurPulse.com.au  Fact Sheet for Healthcare Providers: IncredibleEmployment.be  This test is not yet approved or cleared by the [place] FDA and has been authorized for detection and/or diagnosis of SARS-CoV-2 by FDA under an Emergency Use Authorization (EUA). This EUA will remain in effect (meaning this test can be used) for the duration of the COVID-19 declaration under Section 564(b)(1) of the Act, 21 U.S.C. section 360bbb-3(b)(1), unless the authorization is terminated or revoked.  Performed at [facility], [address], [place], [place] [address]      Radiological Exams on Admission: DG Chest Port 1 View  Result Date: 11/24/2021 CLINICAL DATA:  Chest pain with shortness of breath. EXAM: PORTABLE CHEST 1 VIEW COMPARISON:  Chest x-ray 08/23/2021. FINDINGS: The heart is mildly enlarged, unchanged. There is pulmonary vascular congestion. There is a small right pleural effusion. No pneumothorax. Right-sided central venous catheter tip projects over the mid SVC. There is a healed left clavicular fracture. IMPRESSION: 1. Cardiomegaly with central pulmonary vascular congestion. 2. Small right pleural effusion. Electronically Signed   By: Ronney Asters M.D.   On: 11/24/2021 16:09    EKG: Independently reviewed.  Sinus rhythm.  Left atrial enlargement.  Probable history of anterior septal infarct.  No acute ST changes.  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  We also discussed care given by the ED provider.  NSTEMI with possible fluid overload Admit at [facility] monitoring Heparin drip per cardiology recommendations Run serial troponins Check echocardiogram tomorrow Influenza A Tamiflu every other day given patient's end-stage renal disease Steroids Nebulizer treatments End-stage renal disease on dialysis Mild fluid overloaded Consult nephrology Hypertensive disorder Continue antihypertensives COPD Possible mild exacerbation Starts steroids and continue nebulizer treatments Chronic diastolic heart failure Echocardiogram in the morning.  Daily weights Nonemergent dialysis tomorrow   DVT prophylaxis: On heparin drip Consultants: Cardiology and nephrology Code Status: DNR confirmed by patient Family Communication: None Disposition Plan: Pending   Truett Mainland, DO

## 2021-11-24 NOTE — ED Notes (Signed)
Report given to Dena RN at [facility]. Transportation request has been initiated.

## 2021-11-25 ENCOUNTER — Encounter (HOSPITAL_COMMUNITY): Payer: Self-pay | Admitting: Family Medicine

## 2021-11-25 ENCOUNTER — Other Ambulatory Visit: Payer: Self-pay

## 2021-11-25 ENCOUNTER — Inpatient Hospital Stay (HOSPITAL_COMMUNITY): Payer: 59

## 2021-11-25 DIAGNOSIS — I214 Non-ST elevation (NSTEMI) myocardial infarction: Secondary | ICD-10-CM

## 2021-11-25 DIAGNOSIS — J101 Influenza due to other identified influenza virus with other respiratory manifestations: Principal | ICD-10-CM

## 2021-11-25 DIAGNOSIS — J441 Chronic obstructive pulmonary disease with (acute) exacerbation: Secondary | ICD-10-CM

## 2021-11-25 LAB — CBC
HCT: 39.6 % (ref 39.0–52.0)
Hemoglobin: 12.2 g/dL — ABNORMAL LOW (ref 13.0–17.0)
MCH: 28.3 pg (ref 26.0–34.0)
MCHC: 30.8 g/dL (ref 30.0–36.0)
MCV: 91.9 fL (ref 80.0–100.0)
Platelets: 144 10*3/uL — ABNORMAL LOW (ref 150–400)
RBC: 4.31 MIL/uL (ref 4.22–5.81)
RDW: 17.1 % — ABNORMAL HIGH (ref 11.5–15.5)
WBC: 3.5 10*3/uL — ABNORMAL LOW (ref 4.0–10.5)
nRBC: 0 % (ref 0.0–0.2)

## 2021-11-25 LAB — ECHOCARDIOGRAM COMPLETE
AR max vel: 2.37 cm2
AV Area VTI: 2.48 cm2
AV Area mean vel: 2.4 cm2
AV Mean grad: 25 mmHg
AV Peak grad: 43.2 mmHg
Ao pk vel: 3.29 m/s
Area-P 1/2: 3.12 cm2
Height: 65 in
S' Lateral: 3.1 cm
Weight: 2730.18 oz

## 2021-11-25 LAB — HEPATITIS B SURFACE ANTIGEN: Hepatitis B Surface Ag: NONREACTIVE

## 2021-11-25 LAB — BASIC METABOLIC PANEL
Anion gap: 17 — ABNORMAL HIGH (ref 5–15)
BUN: 58 mg/dL — ABNORMAL HIGH (ref 8–23)
CO2: 20 mmol/L — ABNORMAL LOW (ref 22–32)
Calcium: 7.3 mg/dL — ABNORMAL LOW (ref 8.9–10.3)
Chloride: 96 mmol/L — ABNORMAL LOW (ref 98–111)
Creatinine, Ser: 7.55 mg/dL — ABNORMAL HIGH (ref 0.61–1.24)
GFR, Estimated: 7 mL/min — ABNORMAL LOW (ref >60)
Glucose, Bld: 129 mg/dL — ABNORMAL HIGH (ref 70–99)
Potassium: 5 mmol/L (ref 3.5–5.1)
Sodium: 133 mmol/L — ABNORMAL LOW (ref 135–145)

## 2021-11-25 LAB — TROPONIN I (HIGH SENSITIVITY)
Troponin I (High Sensitivity): 452 ng/L (ref <18)
Troponin I (High Sensitivity): 427 ng/L (ref <18)

## 2021-11-25 LAB — HEPATITIS B SURFACE ANTIBODY,QUALITATIVE: Hep B S Ab: REACTIVE — AB

## 2021-11-25 LAB — HEPARIN LEVEL (UNFRACTIONATED)
Heparin Unfractionated: 0.25 IU/mL — ABNORMAL LOW (ref 0.30–0.70)
Heparin Unfractionated: 0.45 IU/mL (ref 0.30–0.70)

## 2021-11-25 LAB — MRSA NEXT GEN BY PCR, NASAL: MRSA by PCR Next Gen: NOT DETECTED

## 2021-11-25 MED ORDER — ATORVASTATIN CALCIUM 40 MG PO TABS
40.0000 mg | ORAL_TABLET | Freq: Every evening | ORAL | Status: DC
  Administered 2021-11-27: 17:00:00 40 mg via ORAL
  Filled 2021-11-25: qty 1

## 2021-11-25 MED ORDER — IPRATROPIUM-ALBUTEROL 0.5-2.5 (3) MG/3ML IN SOLN
3.0000 mL | Freq: Two times a day (BID) | RESPIRATORY_TRACT | Status: DC
  Administered 2021-11-25: 3 mL via RESPIRATORY_TRACT
  Filled 2021-11-25 (×2): qty 3

## 2021-11-25 MED ORDER — METHYLPREDNISOLONE SODIUM SUCC 125 MG IJ SOLR
INTRAMUSCULAR | Status: AC
  Filled 2021-11-25: qty 2

## 2021-11-25 MED ORDER — CALCIUM CARBONATE ANTACID 500 MG PO CHEW
500.0000 mg | CHEWABLE_TABLET | Freq: Every day | ORAL | Status: DC
  Administered 2021-11-25 – 2021-11-27 (×3): 500 mg via ORAL
  Filled 2021-11-25 (×3): qty 3

## 2021-11-25 MED ORDER — IPRATROPIUM-ALBUTEROL 0.5-2.5 (3) MG/3ML IN SOLN
3.0000 mL | Freq: Four times a day (QID) | RESPIRATORY_TRACT | Status: DC
  Administered 2021-11-25 (×2): 3 mL via RESPIRATORY_TRACT
  Filled 2021-11-25 (×2): qty 3

## 2021-11-25 MED ORDER — METHYLPREDNISOLONE SODIUM SUCC 125 MG IJ SOLR
80.0000 mg | Freq: Every day | INTRAMUSCULAR | Status: AC
  Administered 2021-11-25: 80 mg via INTRAVENOUS
  Filled 2021-11-25: qty 2

## 2021-11-25 MED ORDER — HEPARIN SODIUM (PORCINE) 1000 UNIT/ML DIALYSIS
1500.0000 [IU] | INTRAMUSCULAR | Status: DC | PRN
  Filled 2021-11-25: qty 2

## 2021-11-25 MED ORDER — ACETAMINOPHEN 325 MG PO TABS
650.0000 mg | ORAL_TABLET | Freq: Four times a day (QID) | ORAL | Status: DC | PRN
  Administered 2021-11-25 – 2021-11-27 (×2): 650 mg via ORAL
  Filled 2021-11-25 (×2): qty 2

## 2021-11-25 MED ORDER — PREDNISONE 20 MG PO TABS
40.0000 mg | ORAL_TABLET | Freq: Every day | ORAL | Status: DC
  Administered 2021-11-26 – 2021-11-28 (×3): 40 mg via ORAL
  Filled 2021-11-25 (×3): qty 2

## 2021-11-25 MED ORDER — HEPARIN SODIUM (PORCINE) 5000 UNIT/ML IJ SOLN
5000.0000 [IU] | Freq: Three times a day (TID) | INTRAMUSCULAR | Status: DC
  Administered 2021-11-26 – 2021-11-28 (×5): 5000 [IU] via SUBCUTANEOUS
  Filled 2021-11-25 (×7): qty 1

## 2021-11-25 MED ORDER — CALCITRIOL 0.25 MCG PO CAPS
0.2500 ug | ORAL_CAPSULE | ORAL | Status: DC
  Administered 2021-11-25 – 2021-11-28 (×2): 0.25 ug via ORAL
  Filled 2021-11-25 (×2): qty 1

## 2021-11-25 MED ORDER — CHLORHEXIDINE GLUCONATE CLOTH 2 % EX PADS
6.0000 | MEDICATED_PAD | Freq: Every day | CUTANEOUS | Status: DC
  Administered 2021-11-26 – 2021-11-28 (×3): 6 via TOPICAL

## 2021-11-25 MED ORDER — ASPIRIN EC 81 MG PO TBEC
81.0000 mg | DELAYED_RELEASE_TABLET | Freq: Every day | ORAL | Status: DC
  Administered 2021-11-25 – 2021-11-28 (×4): 81 mg via ORAL
  Filled 2021-11-25 (×4): qty 1

## 2021-11-25 MED ORDER — METOPROLOL TARTRATE 25 MG PO TABS
25.0000 mg | ORAL_TABLET | Freq: Two times a day (BID) | ORAL | Status: DC
  Administered 2021-11-25 (×3): 25 mg via ORAL
  Filled 2021-11-25 (×3): qty 1

## 2021-11-25 MED ORDER — OXYCODONE-ACETAMINOPHEN 5-325 MG PO TABS: 1.0000 | ORAL_TABLET | Freq: Four times a day (QID) | ORAL | Status: DC | PRN

## 2021-11-25 MED ORDER — ONDANSETRON HCL 4 MG/2ML IJ SOLN: 4.0000 mg | Freq: Four times a day (QID) | INTRAMUSCULAR | Status: DC | PRN

## 2021-11-25 MED ORDER — AMLODIPINE BESYLATE 5 MG PO TABS
5.0000 mg | ORAL_TABLET | Freq: Every day | ORAL | Status: DC
  Administered 2021-11-25: 5 mg via ORAL
  Filled 2021-11-25: qty 1

## 2021-11-25 MED ORDER — CLOPIDOGREL BISULFATE 75 MG PO TABS
75.0000 mg | ORAL_TABLET | Freq: Every day | ORAL | Status: DC
  Administered 2021-11-25 – 2021-11-28 (×4): 75 mg via ORAL
  Filled 2021-11-25 (×4): qty 1

## 2021-11-25 MED ORDER — TAMSULOSIN HCL 0.4 MG PO CAPS
0.4000 mg | ORAL_CAPSULE | Freq: Every day | ORAL | Status: DC
  Administered 2021-11-25 – 2021-11-27 (×4): 0.4 mg via ORAL
  Filled 2021-11-25 (×4): qty 1

## 2021-11-25 MED ORDER — UMECLIDINIUM BROMIDE 62.5 MCG/ACT IN AEPB
1.0000 | INHALATION_SPRAY | Freq: Every day | RESPIRATORY_TRACT | Status: DC
Start: 2021-11-25 — End: 2021-11-28
  Administered 2021-11-26 – 2021-11-28 (×3): 1 via RESPIRATORY_TRACT
  Filled 2021-11-25: qty 7

## 2021-11-25 MED ORDER — OSELTAMIVIR PHOSPHATE 30 MG PO CAPS
30.0000 mg | ORAL_CAPSULE | ORAL | Status: AC
  Administered 2021-11-25 – 2021-11-28 (×2): 30 mg via ORAL
  Filled 2021-11-25 (×2): qty 1

## 2021-11-25 MED ORDER — FLUTICASONE FUROATE-VILANTEROL 200-25 MCG/ACT IN AEPB
1.0000 | INHALATION_SPRAY | Freq: Every day | RESPIRATORY_TRACT | Status: DC
  Administered 2021-11-26 – 2021-11-28 (×3): 1 via RESPIRATORY_TRACT
  Filled 2021-11-25: qty 28

## 2021-11-25 MED ORDER — ALBUTEROL SULFATE (2.5 MG/3ML) 0.083% IN NEBU
2.5000 mg | INHALATION_SOLUTION | RESPIRATORY_TRACT | Status: DC | PRN
  Administered 2021-11-26: 09:00:00 2.5 mg via RESPIRATORY_TRACT
  Filled 2021-11-25: qty 3

## 2021-11-25 MED ORDER — PANTOPRAZOLE SODIUM 40 MG PO TBEC
40.0000 mg | DELAYED_RELEASE_TABLET | Freq: Every day | ORAL | Status: DC
  Administered 2021-11-27 – 2021-11-28 (×2): 40 mg via ORAL
  Filled 2021-11-25 (×3): qty 1

## 2021-11-25 MED ORDER — OSELTAMIVIR PHOSPHATE 30 MG PO CAPS
30.0000 mg | ORAL_CAPSULE | ORAL | Status: DC
  Filled 2021-11-25: qty 1

## 2021-11-25 MED ORDER — ONDANSETRON HCL 4 MG PO TABS: 4.0000 mg | ORAL_TABLET | Freq: Four times a day (QID) | ORAL | Status: DC | PRN

## 2021-11-25 MED ORDER — SUCROFERRIC OXYHYDROXIDE 500 MG PO CHEW
1000.0000 mg | CHEWABLE_TABLET | Freq: Three times a day (TID) | ORAL | Status: DC
  Administered 2021-11-26 – 2021-11-28 (×2): 1000 mg via ORAL
  Filled 2021-11-25 (×12): qty 2

## 2021-11-25 NOTE — Progress Notes (Signed)
ANTICOAGULATION CONSULT NOTE Pharmacy Consult for heparin Indication: chest pain/ACS  Allergies  Allergen Reactions   Strawberry (Diagnostic)    Strawberry Extract     Other reaction(s): Unknown    Patient Measurements: Height: 5\' 5"  (165.1 cm) Weight: 80.4 kg (177 lb 4 oz) IBW/kg (Calculated) : 61.5 Heparin Dosing Weight: 78kg  Vital Signs: Temp: 97.6 F (36.4 C) (12/17 1459) Temp Source: Oral (12/17 1459) BP: 126/67 (12/17 1530) Pulse Rate: 72 (12/17 1459)  Labs: Recent Labs    11/24/21 1620 11/24/21 1749 11/25/21 0430 11/25/21 0559 11/25/21 1327  HGB 14.6  --   --   --   --   HCT 48.5  --   --   --   --   PLT 154  --   --   --   --   HEPARINUNFRC  --   --   --  0.25* 0.45  CREATININE 6.65*  --   --  7.55*  --   TROPONINIHS 540* 546* 452* 427*  --      Estimated Creatinine Clearance: 8.6 mL/min (A) (by C-G formula based on SCr of 7.55 mg/dL (H)).  Assessment: 70 y.o. male with SOB/CHF, elevated troponin, for heparin.  Heparin level therapeutic this afternoon.  Goal of Therapy:  Heparin level 0.3-0.7 units/ml Monitor platelets by anticoagulation protocol: Yes   Plan:  Continue heparin 1200 units/h Daily heparin level and CBC  Arrie Senate, PharmD, [place], [facility] Clinical Pharmacist [phone] Please check AMION for all [facility] numbers 11/25/2021

## 2021-11-25 NOTE — Progress Notes (Signed)
°  Echocardiogram 2D Echocardiogram has been performed.  Merrie Roof F 11/25/2021, 2:32 PM

## 2021-11-25 NOTE — Progress Notes (Addendum)
PROGRESS NOTE   Kerry Underwood  WUX:[id number]    DOB: [birth year]    DOA: 11/24/2021  PCP: [name], FNP   I have briefly reviewed patients previous medical records in [facility].  Chief Complaint  Patient presents with   Shortness of Breath    Brief Narrative:  70 year old male, lives alone, independent, medical history significant for chronic diastolic CHF (2D echo 01/14/3663 in [place] with LVEF 55-60%), COPD, ongoing tobacco use, hypertension, ESRD on TTS HD, anemia in ESRD, PAD with intervention, stroke, presented initially to [facility] ED on 11/24/2021 with complaints of progressive dyspnea, cough, fever for which his home inhalers were not helping.  Seen by his PCP and noted to be hypoxic to 60% on room air.  Admitted for influenza A acute bronchitis and NSTEMI.  Case discussed with cardiologist at [facility] and as per recommendations transferred to [facility] for further evaluation by them and IV heparin initiated.   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  Denies prior history of MI or CAD.  No chest pain.  HS Troponin cycled and peaked at 546 and trending down (540 > 546 > 452 > 427).  Started on IV heparin.  2D echo pending.  Continue atorvastatin, Plavix and metoprolol tartrate.  Cardiology input appreciated and if echo okay, may consider outpatient work-up.  They feel that this is likely demand ischemia but MI cannot be definitively ruled out.  Influenza A acute bronchitis with COPD exacerbation: Tobacco cessation counseled.  Continue IV/p.o. steroids, Tamiflu, bronchodilator  nebulization scheduled and as needed, Breo Ellipta and Incruse Ellipta and added flutter valve.  Tobacco abuse: Cessation counseled.  Patient not interested in quitting.  Reports that he has been smoking for 62 years.  Acute respiratory failure with hypoxia: Oxygen saturations in the 60s at PCPs office.  Tachypneic in the 20s and up to 32 early this morning.  Currently saturating at 100-99% on 4 L/min [place] oxygen.  Wean oxygen as tolerated for saturations between 89 and 92%.  Will need home oxygen evaluation prior to discharge.  Essential hypertension: Controlled on amlodipine and metoprolol  Hyperlipidemia: Continue statins  ESRD on TTS HD: Follows with Dr. Marval Regal, [facility].  Consulted nephrology for HD today.  Appears mildly volume overloaded and this can be managed across HD.  Mild hyperkalemia has resolved.  Chronic diastolic CHF: Volume management across HD.  Chest x-ray suggestive of pulmonary edema.  GERD: Continue PPI.  PAD: Follows with Dr. Virl Cagey and had interventions on his right lower extremity in October 2022.  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  Continue Plavix and statins.  Tobacco cessation was counseled.  Body mass index is 28.4 kg/m.     DVT prophylaxis:   Currently on IV heparin   Code Status: DNR Family Communication: None at bedside Disposition:  Status is: Inpatient  Remains inpatient appropriate because: COPD exacerbation on IV steroids, NSTEMI on IV heparin drip pending further evaluation by cardiology.        Consultants:   Cardiology  Procedures:   None  Antimicrobials:      Subjective:  States that his breathing has improved but not yet back to baseline.  Denies ever having chest pain.  Was surprised to hear that he had a heart attack.  Not on home oxygen.  Smokes up to 10 cigarettes/day and has been smoking for the last 62 years.  Denies cough or  fever.  Objective:   Vitals:   11/25/21 0251 11/25/21 0700 11/25/21 0800 11/25/21 0818  BP: 130/75 130/72 131/78   Pulse: 75 69 71   Resp: (!) 25 17 (!) 21   Temp: 97.7 F (36.5 C) 98.1 F (36.7 C)    TempSrc: Oral Oral    SpO2: 99% 100% 99% 99%  Weight: 77.4 kg     Height:        General exam: Early male moderately built and nourished lying comfortably propped up in bed without distress. Respiratory system: Reduced breath sounds bilaterally with scattered occasional rhonchi.  No crackles.  No increased work of breathing.  Right upper anterior chest HD catheter in place. Cardiovascular system: S1 & S2 heard, RRR. No JVD, murmurs, rubs, gallops or clicks.  Trace bilateral ankle edema. Gastrointestinal system: Abdomen is nondistended, soft and nontender. No organomegaly or masses felt. Normal bowel sounds heard. Central nervous system: Alert and oriented. No focal neurological deficits. Extremities: Symmetric 5 x 5 power.  Right lower extremity medial aspect of leg and thigh with vascular intervention surgical scar. Skin: No rashes, lesions or ulcers Psychiatry: Judgement and insight appear normal. Mood & affect appropriate.     Data Reviewed:   I have personally reviewed following labs and imaging studies   CBC: Recent Labs  Lab 11/24/21 1620  WBC 6.8  NEUTROABS 5.9  HGB 14.6  HCT 48.5  MCV 96.0  PLT 614    Basic Metabolic Panel: Recent Labs  Lab 11/24/21 1620 11/25/21 0559  NA 133* 133*  K 5.3* 5.0  CL 92* 96*  CO2 26 20*  GLUCOSE 124* 129*  BUN 41* 58*  CREATININE 6.65* 7.55*  CALCIUM 7.6* 7.3*    Liver Function Tests: No results for input(s): AST, ALT, ALKPHOS, BILITOT, PROT, ALBUMIN in the last 168 hours.  CBG: No results for input(s): GLUCAP in the last 168 hours.  Microbiology Studies:   Recent Results (from the past 240 hour(s))  Resp Panel by RT-PCR (Flu A&B, Covid)     Status: Abnormal   Collection Time: 11/24/21  4:20 PM  Result Value  Ref Range Status   SARS Coronavirus 2 by RT PCR NEGATIVE NEGATIVE Final    Comment: (NOTE) SARS-CoV-2 target nucleic acids are NOT DETECTED.  The SARS-CoV-2 RNA is generally detectable in upper respiratory specimens during the acute phase of infection. The lowest concentration of SARS-CoV-2 viral copies this assay can detect is 138 copies/mL. A negative result does not preclude SARS-Cov-2 infection and should not be used as the sole basis for treatment or other patient management decisions. A negative result may occur with  improper specimen collection/handling, submission of specimen other than nasopharyngeal swab, presence of viral mutation(s) within the areas targeted by this assay, and inadequate number of viral copies(<138 copies/mL). A negative result must be combined with clinical observations, patient history, and epidemiological information. The expected result is Negative.  Fact Sheet for Patients:  EntrepreneurPulse.com.au  Fact Sheet for Healthcare Providers:  IncredibleEmployment.be  This test is no t yet approved or cleared by the [place] FDA and  has been authorized for detection and/or diagnosis  of SARS-CoV-2 by FDA under an Emergency Use Authorization (EUA). This EUA will remain  in effect (meaning this test can be used) for the duration of the COVID-19 declaration under Section 564(b)(1) of the Act, 21 U.S.C.section 360bbb-3(b)(1), unless the authorization is terminated  or revoked sooner.       Influenza A by PCR POSITIVE (A) NEGATIVE Final   Influenza B by PCR NEGATIVE NEGATIVE Final    Comment: (NOTE) The Xpert Xpress SARS-CoV-2/FLU/RSV plus assay is intended as an aid in the diagnosis of influenza from Nasopharyngeal swab specimens and should not be used as a sole basis for treatment. Nasal washings and aspirates are unacceptable for Xpert Xpress SARS-CoV-2/FLU/RSV testing.  Fact Sheet for  Patients: EntrepreneurPulse.com.au  Fact Sheet for Healthcare Providers: IncredibleEmployment.be  This test is not yet approved or cleared by the [place] FDA and has been authorized for detection and/or diagnosis of SARS-CoV-2 by FDA under an Emergency Use Authorization (EUA). This EUA will remain in effect (meaning this test can be used) for the duration of the COVID-19 declaration under Section 564(b)(1) of the Act, 21 U.S.C. section 360bbb-3(b)(1), unless the authorization is terminated or revoked.  Performed at [facility], [address] [place], [place] [address]   MRSA Next Gen by PCR, Nasal     Status: None   Collection Time: 11/24/21 10:46 PM   Specimen: Nasal Mucosa; Nasal Swab  Result Value Ref Range Status   MRSA by PCR Next Gen NOT DETECTED NOT DETECTED Final    Comment: (NOTE) The GeneXpert MRSA Assay (FDA approved for NASAL specimens only), is one component of a comprehensive MRSA colonization surveillance program. It is not intended to diagnose MRSA infection nor to guide or monitor treatment for MRSA infections. Test performance is not FDA approved in patients less than 55 years old. Performed at [facility], [place] [address] [place], [place] [address]     Radiology Studies:  DG Chest Port 1 View  Result Date: 11/24/2021 CLINICAL DATA:  Chest pain with shortness of breath. EXAM: PORTABLE CHEST 1 VIEW COMPARISON:  Chest x-ray 08/23/2021. FINDINGS: The heart is mildly enlarged, unchanged. There is pulmonary vascular congestion. There is a small right pleural effusion. No pneumothorax. Right-sided central venous catheter tip projects over the mid SVC. There is a healed left clavicular fracture. IMPRESSION: 1. Cardiomegaly with central pulmonary vascular congestion. 2. Small right pleural effusion. Electronically Signed   By: Ronney Asters M.D.   On: 11/24/2021 16:09    Scheduled Meds:    amLODipine  5 mg Oral Daily    atorvastatin  40 mg Oral QPM   calcitRIOL  0.25 mcg Oral Q T,Th,Sa-HD   calcium carbonate  500 mg Oral QHS   clopidogrel  75 mg Oral Daily   fluticasone furoate-vilanterol  1 puff Inhalation Daily   ipratropium-albuterol  3 mL Nebulization BID   methylPREDNISolone (SOLU-MEDROL) injection  80 mg Intravenous Daily   Followed by   Derrill Memo ON 11/26/2021] predniSONE  40 mg Oral Q breakfast   metoprolol tartrate  25 mg Oral BID   oseltamivir  30 mg Oral Q T,Th,Sa-HD   pantoprazole  40 mg Oral Daily   sucroferric oxyhydroxide  1,000 mg Oral TID WC   tamsulosin  0.4 mg Oral QHS   umeclidinium bromide  1 puff Inhalation Daily    Continuous Infusions:    heparin 1,200 Units/hr (11/25/21 0723)     LOS: 1 day     Vernell Leep, MD,  FACP,  FHM, SFHM, [facility] (Care Management Physician Certified) [place]  To contact the attending provider between 7A-7P or the covering provider during after hours 7P-7A, please log into the web site [email or web address] and access using universal [place] password for that web site. If you do not have the password, please call the hospital operator.  11/25/2021, 9:13 AM

## 2021-11-25 NOTE — Consult Note (Addendum)
Renal Service Consult Note [facility]  Kerry Underwood 11/25/2021 [name], MD Requesting Physician: Dr. Algis Liming  Reason for Consult: ESRD pt w/ NSTEMI HPI: The patient is a 70 y.o. year-old w/ hx of diast CHF, COPD, tobacco use, HTN, ESRD on HD, anemia, PAD, h/o CVA presented to ED at [facility] on 12/16 w/ progressive SOB, cough and fevers.  PCP found SpO2 in the 60% range. Nebulizers at home didn't help. Went to ED. Trop's were high at 540/ 546.  Pt transported to [facility] for evaluation. CXR showed mild vasc congestion.  Asked to see for ESRD.     Pt seen in room. Pt on HD x 1 year, in [place]. Last HD Thursday. Had fever 102 at ome and SOB prompting visit to hospital. +still makes good amts of urine. No change in fluid intake since starting HD.  Recently underwent bypass surgery to the R leg w/ well healing wounds all the way up and down that leg. Has partial toe amp at the same time.    ROS - denies CP, no joint pain, no HA, no blurry vision, no rash, no diarrhea, no nausea/ vomiting, no dysuria, no difficulty voiding   Past Medical History  Past Medical History:  Diagnosis Date   Anemia    CHF (congestive heart failure) (HCC)    COPD (chronic obstructive pulmonary disease) ([place])    Full dentures    Hypertension    Pneumonia    Pyelonephritis 09/11/2017   Renal disorder    Stroke [facility])    " A few mini strokes, I didn't know it "   Past Surgical History  Past Surgical History:  Procedure Laterality Date   ABDOMINAL AORTOGRAM W/LOWER EXTREMITY Bilateral 09/20/2021   Procedure: ABDOMINAL AORTOGRAM W/LOWER EXTREMITY;  Surgeon: Broadus John, MD;  Location: [place] [facility];  Service: Cardiovascular;  Laterality: Bilateral;   AMPUTATION TOE Right 09/22/2021   Procedure: PARTIAL AMPUTATION SECOND RIGHT TOE;  Surgeon: Marty Heck, MD;  Location: [place];  Service: Vascular;  Laterality: Right;   [place] Right 04/19/2020    Procedure: Basilic Vein Transposition Right Arm;  Surgeon: Waynetta Sandy, MD;  Location: [place];  Service: Vascular;  Laterality: Right;   COLON SURGERY     ENDARTERECTOMY FEMORAL Right 09/22/2021   Procedure: ENDARTERECTOMY FEMORAL WITH PATCH ANGIOPLASTY USING 1X6CM Weyman Pedro;  Surgeon: Marty Heck, MD;  Location: [place];  Service: Vascular;  Laterality: Right;   FEMORAL-TIBIAL BYPASS GRAFT Right 09/22/2021   Procedure: RIGHT FEMORAL TO BELOW KNEE POPLITEAL BYPASS WITH HARVEST OF GREATER SAPHENOUS VEIN;  Surgeon: Marty Heck, MD;  Location: [place];  Service: Vascular;  Laterality: Right;   INSERTION OF DIALYSIS CATHETER Right 04/19/2020   Procedure: Insertion Of Right Internal Jugular Dialysis Catheter;  Surgeon: Waynetta Sandy, MD;  Location: [facility] OR;  Service: Vascular;  Laterality: Right;   MULTIPLE TOOTH EXTRACTIONS     PERIPHERAL VASCULAR BALLOON ANGIOPLASTY Right 09/20/2021   Procedure: PERIPHERAL VASCULAR BALLOON ANGIOPLASTY;  Surgeon: Broadus John, MD;  Location: [place] [facility];  Service: Cardiovascular;  Laterality: Right;  SFA  FAILED   Family History  Family History  Problem Relation Age of Onset   Alzheimer's disease Mother    Social History  reports that he has been smoking cigarettes. He has a 31.00 pack-year smoking history. He has quit using smokeless tobacco.  His smokeless tobacco use included chew. He reports that he does not currently use  drugs after having used the following drugs: Marijuana. He reports that he does not drink alcohol. Allergies  Allergies  Allergen Reactions   Strawberry (Diagnostic)    Strawberry Extract     Other reaction(s): Unknown   Home medications Prior to Admission medications   Medication Sig Start Date End Date Taking? Authorizing Provider  albuterol (VENTOLIN HFA) 108 (90 Base) MCG/ACT inhaler Inhale 2 puffs into the lungs every 6 (six) hours as needed for wheezing or shortness of breath.  07/02/21 11/24/21 Yes Barton Dubois, MD  amLODipine (NORVASC) 5 MG tablet Take 5 mg by mouth daily. 09/25/21  Yes [provider]  aspirin EC 325 MG tablet Take 325 mg by mouth at bedtime.   Yes [provider]  atorvastatin (LIPITOR) 40 MG tablet Take 1 tablet (40 mg total) by mouth daily. 09/26/21  Yes Ghimire, Henreitta Leber, MD  Cholecalciferol (VITAMIN D3 PO) Take by mouth daily.   Yes [provider]  clopidogrel (PLAVIX) 75 MG tablet Take 1 tablet (75 mg total) by mouth daily with breakfast. 09/26/21  Yes Ghimire, Henreitta Leber, MD  fluticasone furoate-vilanterol (BREO ELLIPTA) 200-25 MCG/INH AEPB Inhale 1 puff into the lungs daily. 07/02/21  Yes Barton Dubois, MD  heparin 1000 unit/mL SOLN injection Heparin Sodium (Porcine) 1,000 Units/mL Catheter Lock Arterial 10/05/21 10/04/22 Yes [provider]  Methoxy PEG-Epoetin Beta (MIRCERA IJ) Mircera 09/28/21 09/27/22 Yes [provider]  metoprolol tartrate (LOPRESSOR) 25 MG tablet Take 1 tablet (25 mg total) by mouth 2 (two) times daily. 09/25/21 11/24/21 Yes Ghimire, Henreitta Leber, MD  sucroferric oxyhydroxide (VELPHORO) 500 MG chewable tablet Chew 1,000 mg by mouth 3 (three) times daily with meals.   Yes [provider]  tamsulosin (FLOMAX) 0.4 MG CAPS capsule Take 0.4 mg by mouth at bedtime. 05/24/20  Yes [provider]  tiotropium (SPIRIVA HANDIHALER) 18 MCG inhalation capsule Place 1 capsule (18 mcg total) into inhaler and inhale daily. 07/02/21 11/24/21 Yes Barton Dubois, MD  B Complex-C-Folic Acid (DIALYVITE 109 PO) Take 0.8 mg by mouth at bedtime. Patient not taking: Reported on 11/24/2021    [provider]  calcitRIOL (ROCALTROL) 0.25 MCG capsule Take 1 capsule (0.25 mcg total) by mouth Every Tuesday,Thursday,and Saturday with dialysis. Patient not taking: Reported on 11/24/2021 09/26/21   Jonetta Osgood, MD  Calcium Carbonate 500 MG CHEW Chew 500 mg by mouth at  bedtime. Patient not taking: Reported on 11/24/2021    [provider]  oxyCODONE-acetaminophen (PERCOCET) 5-325 MG tablet Take 1 tablet by mouth every 6 (six) hours as needed. Patient not taking: Reported on 11/24/2021 10/16/21   Gabriel Earing, PA-C  pantoprazole (PROTONIX) 40 MG tablet Take 1 tablet (40 mg total) by mouth daily. Patient not taking: Reported on 11/24/2021 09/26/21   Jonetta Osgood, MD     Vitals:   11/25/21 0700 11/25/21 0800 11/25/21 0818 11/25/21 1100  BP: 130/72 131/78  123/72  Pulse: 69 71  70  Resp: 17 (!) 21  18  Temp: 98.1 F (36.7 C)   98 F (36.7 C)  TempSrc: Oral   Oral  SpO2: 100% 99% 99% 100%  Weight:      Height:       Exam Gen alert, no distress, nasal o2 No rash, cyanosis or gangrene Sclera anicteric, throat clear  +JVD Chest - faint basilar rales bilat no wheezing RRR no MRG Abd soft ntnd no mass or ascites +bs GU normal male MS no joint effusions or deformity Ext +  1 pretib edema, no wounds or ulcers Neuro is alert, Ox 3 , nf     Home meds include - norvasc, asa, lipitor, plavix, breoellipta, lopressor 25 bid, velphoro 2 ac tid, flomax, spiriva, percocet prn, protonix, rocaltrol, D3, Ca CO3    CXR 12/16 - new vasc congestion compared to Sept 2022   OP HD: TTS [facility]     4h 77min  80.7kg   350/500  P4   RIJ TDC  Heparin  5000  - no esa  - calcitriol 0.25 ug tiw po   Assessment/ Plan: NSTEMI - trop 540 range, per pmd / cardiology +Flu A - per pmd SOB - w/ new CXR findings suspicious for vol overload. On exam looks slightly wet. Will see how he responds to high UF w/ HD today.  ESRD - on HD TTS.  Uses TDC.  HD today as above.  HTN  -BP's wnl, can continue home BP meds but keep SBP > 110-120 for now Anemia ckd - Hb 14, no esa needs MBD ckd - Ca ok, check phos, cont vdra, binders PAD - had leg bypass surgery in October COPD  H/o CVA      Rob Eron Staat  MD 11/25/2021, 1:16 PM  Recent Labs  Lab 11/24/21 1620   WBC 6.8  HGB 14.6   Recent Labs  Lab 11/24/21 1620 11/25/21 0559  K 5.3* 5.0  BUN 41* 58*  CREATININE 6.65* 7.55*  CALCIUM 7.6* 7.3*

## 2021-11-25 NOTE — Plan of Care (Signed)
  Problem: Education: Goal: Knowledge of General Education information will improve Description: Including pain rating scale, medication(s)/side effects and non-pharmacologic comfort measures Outcome: Progressing   Problem: Health Behavior/Discharge Planning: Goal: Ability to manage health-related needs will improve Outcome: Progressing   Problem: Clinical Measurements: Goal: Ability to maintain clinical measurements within normal limits will improve Outcome: Progressing Goal: Will remain free from infection Outcome: Progressing Goal: Diagnostic test results will improve Outcome: Progressing Goal: Respiratory complications will improve Outcome: Progressing Goal: Cardiovascular complication will be avoided Outcome: Progressing   Problem: Activity: Goal: Risk for activity intolerance will decrease Outcome: Progressing   Problem: Nutrition: Goal: Adequate nutrition will be maintained Outcome: Progressing   Problem: Coping: Goal: Level of anxiety will decrease Outcome: Progressing   Problem: Elimination: Goal: Will not experience complications related to bowel motility Outcome: Progressing Goal: Will not experience complications related to urinary retention Outcome: Progressing   Problem: Pain Managment: Goal: General experience of comfort will improve Outcome: Progressing   Problem: Safety: Goal: Ability to remain free from injury will improve Outcome: Progressing   Problem: Skin Integrity: Goal: Risk for impaired skin integrity will decrease Outcome: Progressing   Problem: Activity: Goal: Ability to tolerate increased activity will improve Outcome: Progressing Goal: Will verbalize the importance of balancing activity with adequate rest periods Outcome: Progressing   Problem: Respiratory: Goal: Ability to maintain a clear airway will improve Outcome: Progressing Goal: Levels of oxygenation will improve Outcome: Progressing Goal: Ability to maintain adequate  ventilation will improve Outcome: Progressing   

## 2021-11-25 NOTE — Progress Notes (Signed)
Mobility Specialist Progress Note:   11/25/21 1027  Mobility  Activity Ambulated in hall  Level of Assistance Standby assist, set-up cues, supervision of patient - no hands on  Assistive Device None  Distance Ambulated (ft) 400 ft  Mobility Ambulated with assistance in hallway  Mobility Response Tolerated well  Mobility performed by Mobility specialist  $Mobility charge 1 Mobility   Pt received in bed willing to participate in mobility. No complaints of pain. Required 3 standing rest breaks d/t feeling SOB. Pt returned to EOB with call bell in reach and all needs met.   [facility] Public librarian Phone [phone] Secondary Phone [phone]

## 2021-11-25 NOTE — Consult Note (Addendum)
Cardiology Consultation:   Patient ID: Kerry Underwood MRN: [id number]; DOB: 05-20-1951  Admit date: 11/24/2021 Date of Consult: 11/25/2021  PCP:  [name], FNP   [facility] Providers Cardiologist:  None   {New   Patient Profile:   Kerry Underwood is a 70 y.o. male with a hx of hypertension, COPD with ongoing tobacco smoking, and end-stage renal disease on hemodialysis (TThSat) who is being seen 11/25/2021 for the evaluation of elevated troponin at the request of Dr. Nehemiah Settle.  History of Present Illness:   Kerry Underwood presented from PCP office with acute hypoxic respiratory failure.  Patient has 1 days history of progressive worsening shortness of breath and then cough.  No improvement despite using inhaler.  No nebulizer treatment.  Went to see PCP office and noted hypoxic at 60% on room air.  Placed on supplemental oxygen and brought to ER.  No missed dialysis session.  Chest x-ray showed cardiomegaly with central vascular congestion.  Small right pleural effusion.  Patient was found to have influenza A.  Started on steroids and nebulizer for possible COPD exacerbation.  Cardiology is consulted for elevated troponin.  Patient is on heparin for anticoagulation.  Hs-troponin 540>>546>>452>>427 K 5.3>>5 HGb 14.6  At baseline patient is active.  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  He also rides horses.  Patient denies any exertional chest pain or shortness of breath.  No palpitation, orthopnea, PND, syncope, or melena.  Reports chronic mild right lower extremity edema due to prior fistula graft creation.   Longstanding history of tobacco smoking for past 62 years.  He used to smoke 2 packs of cigarette, currently smoking half pack per day   Past Medical History:  Diagnosis Date   Anemia    CHF (congestive heart failure) (HCC)    COPD (chronic obstructive pulmonary disease) ([place])    Full dentures    Hypertension    Pneumonia    Pyelonephritis 09/11/2017   Renal  disorder    Stroke [facility])    " A few mini strokes, I didn't know it "    Past Surgical History:  Procedure Laterality Date   ABDOMINAL AORTOGRAM W/LOWER EXTREMITY Bilateral 09/20/2021   Procedure: ABDOMINAL AORTOGRAM W/LOWER EXTREMITY;  Surgeon: Broadus John, MD;  Location: [place] [facility];  Service: Cardiovascular;  Laterality: Bilateral;   AMPUTATION TOE Right 09/22/2021   Procedure: PARTIAL AMPUTATION SECOND RIGHT TOE;  Surgeon: Marty Heck, MD;  Location: [place];  Service: Vascular;  Laterality: Right;   [place] Right 04/19/2020   Procedure: Basilic Vein Transposition Right Arm;  Surgeon: Waynetta Sandy, MD;  Location: [place];  Service: Vascular;  Laterality: Right;   COLON SURGERY     ENDARTERECTOMY FEMORAL Right 09/22/2021   Procedure: ENDARTERECTOMY FEMORAL WITH PATCH ANGIOPLASTY USING 1X6CM Weyman Pedro;  Surgeon: Marty Heck, MD;  Location: [place];  Service: Vascular;  Laterality: Right;   FEMORAL-TIBIAL BYPASS GRAFT Right 09/22/2021   Procedure: RIGHT FEMORAL TO BELOW KNEE POPLITEAL BYPASS WITH HARVEST OF GREATER SAPHENOUS VEIN;  Surgeon: Marty Heck, MD;  Location: [place];  Service: Vascular;  Laterality: Right;   INSERTION OF DIALYSIS CATHETER Right 04/19/2020   Procedure: Insertion Of Right Internal Jugular Dialysis Catheter;  Surgeon: Waynetta Sandy, MD;  Location: [place];  Service: Vascular;  Laterality: Right;   MULTIPLE TOOTH EXTRACTIONS     PERIPHERAL VASCULAR BALLOON ANGIOPLASTY Right 09/20/2021   Procedure: PERIPHERAL VASCULAR BALLOON ANGIOPLASTY;  Surgeon: Virl Cagey,  Carolann Littler, MD;  Location: [place] [facility];  Service: Cardiovascular;  Laterality: Right;  SFA  FAILED     Inpatient Medications: Scheduled Meds:  amLODipine  5 mg Oral Daily   atorvastatin  40 mg Oral QPM   calcitRIOL  0.25 mcg Oral Q T,Th,Sa-HD   calcium carbonate  500 mg Oral QHS   clopidogrel  75 mg Oral Daily   fluticasone  furoate-vilanterol  1 puff Inhalation Daily   ipratropium-albuterol  3 mL Nebulization BID   methylPREDNISolone (SOLU-MEDROL) injection  80 mg Intravenous Daily   Followed by   Derrill Memo ON 11/26/2021] predniSONE  40 mg Oral Q breakfast   metoprolol tartrate  25 mg Oral BID   oseltamivir  30 mg Oral Q T,Th,Sa-HD   pantoprazole  40 mg Oral Daily   sucroferric oxyhydroxide  1,000 mg Oral TID WC   tamsulosin  0.4 mg Oral QHS   umeclidinium bromide  1 puff Inhalation Daily   Continuous Infusions:  heparin 1,200 Units/hr (11/25/21 0723)   PRN Meds: albuterol, ondansetron **OR** ondansetron (ZOFRAN) IV, oxyCODONE-acetaminophen  Allergies:    Allergies  Allergen Reactions   Strawberry (Diagnostic)    Strawberry Extract     Other reaction(s): Unknown    Social History:   Social History   Socioeconomic History   Marital status: Single    Spouse name: Not on file   Number of children: Not on file   Years of education: Not on file   Highest education level: Not on file  Occupational History   Occupation: retired  Tobacco Use   Smoking status: Every Day    Packs/day: 0.50    Years: 62.00    Pack years: 31.00    Types: Cigarettes   Smokeless tobacco: Former    Types: Chew   Tobacco comments:    Started smoking at age 10 - rolls his own tobacco cigarettes   Vaping Use   Vaping Use: Never used  Substance and Sexual Activity   Alcohol use: No   Drug use: Not Currently    Types: Marijuana   Sexual activity: Not Currently    Birth control/protection: None  Other Topics Concern   Not on file  Social History Narrative   Lives alone. No family nearby. He does get out and socialize with friends.    Doesn't have  a good relationship with his children   Social Determinants of Health   Financial Resource Strain: Medium Risk   Difficulty of Paying Living Expenses: Somewhat hard  Food Insecurity: No Food Insecurity   Worried About Charity fundraiser in the Last Year: Never true    Ran Out of Food in the Last Year: Never true  Transportation Needs: No Transportation Needs   Lack of Transportation (Medical): No   Lack of Transportation (Non-Medical): No  Physical Activity: Sufficiently Active   Days of Exercise per Week: 5 days   Minutes of Exercise per Session: 30 min  Stress: No Stress Concern Present   Feeling of Stress : Not at all  Social Connections: Socially Isolated   Frequency of Communication with Friends and Family: More than three times a week   Frequency of Social Gatherings with Friends and Family: More than three times a week   Attends Religious Services: Never   Marine scientist or Organizations: No   Attends Archivist Meetings: Never   Marital Status: Never married  Human resources officer Violence: Not At Risk   Fear of Current or  Ex-Partner: No   Emotionally Abused: No   Physically Abused: No   Sexually Abused: No    Family History:   Family History  Problem Relation Age of Onset   Alzheimer's disease Mother      ROS:  Please see the history of present illness.  All other ROS reviewed and negative.     Physical Exam/Data:   Vitals:   11/25/21 0251 11/25/21 0700 11/25/21 0800 11/25/21 0818  BP: 130/75 130/72 131/78   Pulse: 75 69 71   Resp: (!) 25 17 (!) 21   Temp: 97.7 F (36.5 C) 98.1 F (36.7 C)    TempSrc: Oral Oral    SpO2: 99% 100% 99% 99%  Weight: 77.4 kg     Height:        Intake/Output Summary (Last 24 hours) at 11/25/2021 0905 Last data filed at 11/25/2021 0723 Gross per 24 hour  Intake 503.1 ml  Output 0 ml  Net 503.1 ml   Last 3 Weights 11/25/2021 11/24/2021 11/24/2021  Weight (lbs) 170 lb 10.2 oz 170 lb 10.2 oz 180 lb  Weight (kg) 77.4 kg 77.4 kg 81.647 kg     Body mass index is 28.4 kg/m.  General:  Well nourished, well developed, in no acute distress HEENT: normal Neck: no JVD Vascular: No carotid bruits; Distal pulses 2+ bilaterally Cardiac:  normal S1, S2; RRR; systolic murmur   Lungs: Faint basilar Rales  abd: soft, nontender, no hepatomegaly  Ext: Trace right lower extremity edema Musculoskeletal:  No deformities, BUE and BLE strength normal and equal Skin: warm and dry  Neuro:  CNs 2-12 intact, no focal abnormalities noted Psych:  Normal affect   EKG:  The EKG was personally reviewed and demonstrates: sinus rhythm Telemetry:  Telemetry was personally reviewed and demonstrates: Sinus rhythm, PVC  Relevant CV Studies:  Echo 05/2017 PROCEDURE  Study Quality: Technically adequate.  SUMMARY  Normal LV systolic function.  Mild LVH.  Diastolic dysfunction.  No significant valvular disease.  no prior.  FINDINGS:   LEFT VENTRICLE  The left ventricular size is normal. Mild left ventricular hypertrophy. Left  ventricular systolic function is normal. LV ejection fraction = 55-60%. Left  ventricular filling pattern is impaired relaxation. No segmental wall motion  abnormalities seen in the left ventricle.   RIGHT VENTRICLE  The right ventricle is normal in size and function.   LEFT ATRIUM  The left atrial size is normal.   RIGHT ATRIUM   Right atrial size is normal.  AORTIC VALVE  Focal calcification of the aortic valve. There is no aortic stenosis.  MITRAL VALVE  The mitral valve is normal in structure and function. There is trace mitral  regurgitation.  TRICUSPID VALVE  Structurally normal tricuspid valve. There is mild tricuspid regurgitation.  PULMONIC VALVE  Structurally normal pulmonic valve. There is no pulmonic valvular  regurgitation.  ARTERIES  The aortic root is normal size.  VENOUS  Pulmonary venous flow pattern is normal. IVC size was normal.  EFFUSION  There is no pericardial effusion.  MMode/2D Measurements & Calculations  IVSd: 1.5 cm  LVIDd: 4.4 cm  LVPWd: 1.5 cm  LVIDs: 2.8 cm  LA dim: 4.4 cm  Ao root: 3.0 cm  EDV(MOD-sp4): 59.2 ml  IVS/LVPW: 1.0  FS: 37.7 %  EDV(Teich): 88.6 ml  ESV(Teich): 28.3 ml  ESV(MOD-sp4):  21.2 ml  EDV(cubed): 86.4 ml  ESV(cubed): 20.8 ml  EF(cubed): 75.9 %  LV mass(C)d: 273.4 grams  LV mass(C)dI: 154.0 grams/m2  SV(Teich):  60.3 ml  SI(Teich): 34.0 ml/m2  SV(cubed): 65.5 ml  SI(cubed): 36.9 ml/m2  Ao root area: 7.0 cm2  ACS: 1.8 cm  LA/Ao: 1.5  LVOT diam: 2.0 cm  LVOT area: 3.3 cm2  LVOT area(traced): 3.1 cm2  SV(MOD-sp4): 38.0 ml  SI(MOD-sp4): 21.4 ml/m2  Doppler Measurements & Calculations  MV E max vel: 83.9 cm/sec  MV A max vel: 62.6 cm/sec  MV E/A: 1.3  MV dec time: 0.12 sec  SV(LVOT): 106.7 ml  Ao V2 max: 202.1 cm/sec  Ao max PG: 16.3 mmHg  Ao max PG (full): 5.4 mmHg  Ao V2 mean: 136.0 cm/sec  Ao mean PG: 8.5 mmHg  Ao mean PG (full): 3.3 mmHg  Ao V2 VTI: 35.0 cm  AVA (VTI): 3.1 cm2  LV V1 VTI: 32.8 cm  SV(Ao): 246.4 ml  SI(Ao): 138.8 ml/m2  SI(LVOT): 60.1 ml/m2  PA max PG: 12.3 mmHg  AS Dimensionless Index (VTI): 0.94  AVAi(VTI) cm^2/m^2: 1.7 cm2  SV index(LVOT): 60.1 ml/m2    Reading Physician:  MD Shaune Pollack, [id number] 05/15/2017 02:34 PM  Laboratory Data:  High Sensitivity Troponin:   Recent Labs  Lab 11/24/21 1620 11/24/21 1749 11/25/21 0430 11/25/21 0559  TROPONINIHS 540* 546* 452* 427*     Chemistry Recent Labs  Lab 11/24/21 1620 11/25/21 0559  NA 133* 133*  K 5.3* 5.0  CL 92* 96*  CO2 26 20*  GLUCOSE 124* 129*  BUN 41* 58*  CREATININE 6.65* 7.55*  CALCIUM 7.6* 7.3*  GFRNONAA 8* 7*  ANIONGAP 15 17*   Hematology Recent Labs  Lab 11/24/21 1620  WBC 6.8  RBC 5.05  HGB 14.6  HCT 48.5  MCV 96.0  MCH 28.9  MCHC 30.1  RDW 17.2*  PLT 154    Radiology/Studies:  DG Chest Port 1 View  Result Date: 11/24/2021 CLINICAL DATA:  Chest pain with shortness of breath. EXAM: PORTABLE CHEST 1 VIEW COMPARISON:  Chest x-ray 08/23/2021. FINDINGS: The heart is mildly enlarged, unchanged. There is pulmonary vascular congestion. There is a small right pleural effusion. No pneumothorax. Right-sided central venous catheter tip  projects over the mid SVC. There is a healed left clavicular fracture. IMPRESSION: 1. Cardiomegaly with central pulmonary vascular congestion. 2. Small right pleural effusion. Electronically Signed   By: Ronney Asters M.D.   On: 11/24/2021 16:09     Assessment and Plan:   Elevated troponin/non-STEMI in setting of influenza A and possible COPD exacerbation -Patient denies any exertional chest pain or shortness of breath.  EKG without acute ischemic changes.  Troponin peaked at 546 and then trending down.  His elevated troponin is likely demand ischemia.  Certainly, he could have underlying coronary artery disease given risk factors.  He has history of severe tobacco smoking and hypertension. -Patient has already eaten breakfast.  Continue heparin for now.  Further recommendations based on echocardiogram.  Likely outpatient testing if reassuring echo  2.  Hypertension -Blood pressures controlled on current medications  3.  Tobacco smoking -No plan to quit.  Otherwise per primary team. MD to see  Risk Assessment/Risk Scores:   TIMI Risk Score for Unstable Angina or Non-ST Elevation MI:   The patient's TIMI risk score is 3, which indicates a 13% risk of all cause mortality, new or recurrent myocardial infarction or need for urgent revascularization in the next 14 days.{  For questions or updates, please contact [place] Please consult [email or web address] for contact info under   Jarrett Soho, PA  11/25/2021 9:05 AM  Patient seen and examined and agree with Robbie Lis, PA-C as detailed above.  In brief, the patient is a 70 year old male with history of hypertension, PAD, COPD with ongoing tobacco smoking, and end-stage renal disease on hemodialysis (TThSat) who presented to the ER with hypoxia and SOB found to be influenza A positive. Cardiology is consulted for elevated troponin 540>427.  The patient states that prior to the current episode, he has been active without  anginal symptoms. Has a known history of PAD but no known history of CAD or prior PCIs although has numerous risk factors. Given lack of anginal symptoms, however, suspect current episode is secondary to demand in the setting of influenza A, COPD exacerbation and ESRD. Will plan for out-patient ischemic evaluation once more clinically stable unless echo shows concerning findings.  Exam: GEN: Sitting in bed, comfortable, NAD Neck: No JVD Cardiac: RR, no murmurs Respiratory: Audible wheezing, poor air movement GI: Soft, nontender, non-distended  MS: Trace LLE (chronic). Warm Neuro:  Nonfocal  Psych: Normal affect    Plan: -Follow-up TTE -If no concerning findings on TTE, will plan for out-patient ischemic evaluation -Can likely stop heparin gtt pending echo -Continue ASA 81mg  daily, plavix 75mg  daily for PAD -Continue lipitor 40mg  daily -Continue metoprolol 25mg  BID; transition to long-acting prior to discharge -Management of influenza and COPD exacerbation per primary  Gwyndolyn Kaufman, MD

## 2021-11-25 NOTE — Procedures (Signed)
Pt seen, examined and agree w A/P as above.  Kelly Splinter  MD 11/25/2021, 3:55 PM

## 2021-11-25 NOTE — Progress Notes (Signed)
ANTICOAGULATION CONSULT NOTE Pharmacy Consult for heparin Indication: chest pain/ACS Brief A/P: Heparin level subtherapeutic Increase Heparin rate Recheck level later today   Allergies  Allergen Reactions   Strawberry (Diagnostic)    Strawberry Extract     Other reaction(s): Unknown    Patient Measurements: Height: 5\' 5"  (165.1 cm) Weight: 77.4 kg (170 lb 10.2 oz) IBW/kg (Calculated) : 61.5 Heparin Dosing Weight: 78kg  Vital Signs: Temp: 97.7 F (36.5 C) (12/17 0251) Temp Source: Oral (12/17 0251) BP: 130/75 (12/17 0251) Pulse Rate: 75 (12/17 0251)  Labs: Recent Labs    11/24/21 1620 11/24/21 1749 11/25/21 0430 11/25/21 0559  HGB 14.6  --   --   --   HCT 48.5  --   --   --   PLT 154  --   --   --   HEPARINUNFRC  --   --   --  0.25*  CREATININE 6.65*  --   --  7.55*  TROPONINIHS 540* 546* 452*  --      Estimated Creatinine Clearance: 8.5 mL/min (A) (by C-G formula based on SCr of 7.55 mg/dL (H)).  Assessment: 70 y.o. male with SOB/CHF, elevated troponin, for heparin  Goal of Therapy:  Heparin level 0.3-0.7 units/ml Monitor platelets by anticoagulation protocol: Yes   Plan:  Increase Heparin 1200 units/hr Check heparin level in 6 hours.  Phillis Knack, PharmD, BCPS  11/25/2021 6:53 AM

## 2021-11-26 ENCOUNTER — Inpatient Hospital Stay (HOSPITAL_COMMUNITY): Payer: 59

## 2021-11-26 DIAGNOSIS — I248 Other forms of acute ischemic heart disease: Secondary | ICD-10-CM | POA: Diagnosis present

## 2021-11-26 LAB — CBC
HCT: 37.4 % — ABNORMAL LOW (ref 39.0–52.0)
Hemoglobin: 11.8 g/dL — ABNORMAL LOW (ref 13.0–17.0)
MCH: 28.4 pg (ref 26.0–34.0)
MCHC: 31.6 g/dL (ref 30.0–36.0)
MCV: 89.9 fL (ref 80.0–100.0)
Platelets: 172 10*3/uL (ref 150–400)
RBC: 4.16 MIL/uL — ABNORMAL LOW (ref 4.22–5.81)
RDW: 16.4 % — ABNORMAL HIGH (ref 11.5–15.5)
WBC: 5.1 10*3/uL (ref 4.0–10.5)
nRBC: 0 % (ref 0.0–0.2)

## 2021-11-26 LAB — PHOSPHORUS: Phosphorus: 5.3 mg/dL — ABNORMAL HIGH (ref 2.5–4.6)

## 2021-11-26 LAB — HEPATITIS B SURFACE ANTIBODY, QUANTITATIVE: Hep B S AB Quant (Post): 72.7 m[IU]/mL (ref >9.9)

## 2021-11-26 MED ORDER — DOXYCYCLINE HYCLATE 100 MG PO TABS
100.0000 mg | ORAL_TABLET | Freq: Two times a day (BID) | ORAL | Status: DC
  Administered 2021-11-28: 13:00:00 100 mg via ORAL
  Filled 2021-11-26 (×2): qty 1

## 2021-11-26 MED ORDER — IPRATROPIUM-ALBUTEROL 0.5-2.5 (3) MG/3ML IN SOLN
3.0000 mL | Freq: Three times a day (TID) | RESPIRATORY_TRACT | Status: DC
Start: 2021-11-26 — End: 2021-11-28
  Administered 2021-11-26 – 2021-11-28 (×7): 3 mL via RESPIRATORY_TRACT
  Filled 2021-11-26 (×7): qty 3

## 2021-11-26 MED ORDER — METOPROLOL SUCCINATE ER 25 MG PO TB24: 25.0000 mg | ORAL_TABLET | Freq: Every day | ORAL | Status: DC

## 2021-11-26 MED ORDER — IPRATROPIUM-ALBUTEROL 0.5-2.5 (3) MG/3ML IN SOLN: 3.0000 mL | Freq: Three times a day (TID) | RESPIRATORY_TRACT | Status: DC

## 2021-11-26 MED ORDER — GUAIFENESIN-DM 100-10 MG/5ML PO SYRP
5.0000 mL | ORAL_SOLUTION | ORAL | Status: DC | PRN
  Administered 2021-11-26 – 2021-11-28 (×4): 5 mL via ORAL
  Filled 2021-11-26 (×4): qty 5

## 2021-11-26 NOTE — Progress Notes (Signed)
[facility] Progress Note  Subjective: had 3.5 L off yest net w/o sig BP drops. BP's today 267- 124 systolic. Had cramps in hands mid HD and is still cramping today.   Vitals:   11/26/21 0001 11/26/21 0339 11/26/21 0521 11/26/21 0748  BP: 112/61 (!) 108/54  135/71  Pulse: 78 71  81  Resp: 19 18  18   Temp: 97.9 F (36.6 C) 98.1 F (36.7 C)  98 F (36.7 C)  TempSrc: Oral Oral  Oral  SpO2: 91% 90%  92%  Weight:   77.8 kg   Height:        Exam: Gen alert, no distress, nasal o2 Neck flat neck veins Chest diffuse rhonchi, some wheezing, not in distress RRR no MRG Abd soft ntnd no mass or ascites +bs Ext no edema, no wounds or ulcers Neuro is alert, Ox 3 , nf   R chest TDC intact      Home meds include - norvasc, asa, lipitor, plavix, breoellipta, lopressor 25 bid, velphoro 2 ac tid, flomax, spiriva, percocet prn, protonix, rocaltrol, D3, Ca CO3     CXR 12/16 - new vasc congestion compared to Sept 2022    OP HD: TTS [facility]     4h 42min  80.7kg   350/500  P4   RIJ TDC  Heparin  5000  - no esa  - calcitriol 0.25 ug tiw po     Assessment/ Plan: NSTEMI - trop 540 range, per pmd / cardiology +Flu A - per pmd SOB - initial CXR + for vasc congestion. Got 3.5 L off w/ HD yesterday but w/ cramping mid HD ongoing this am. Came off 3kg under at 77kg. F/U CXR shows resolved vasc congestion. Doubt current SOB due to vol overload, suspect upper airway/ COPD/ flu primary issues.  ESRD - on HD TTS.  Using [facility]. HD here yesterday, next HD 12/20. HD access - on HD 1 year, using TDC placed May 2021. Also had 1st stage BVT May 2021 but don't see that 2nd stage has been done. Consider VVS referral OP, or IP while here.  HTN  -BP's down w/ volume removal, have dc'd norvasc and BB for now Anemia ckd - Hb > 10, no esa needs MBD ckd - Ca ok, check phos, cont vdra, binders PAD - had RLE bypass surgery in October COPD  H/o CVA     Rob Tyauna Lacaze 11/26/2021, 8:36 AM   Recent Labs   Lab 11/24/21 1620 11/25/21 0559 11/26/21 0026  K 5.3* 5.0  --   BUN 41* 58*  --   CREATININE 6.65* 7.55*  --   CALCIUM 7.6* 7.3*  --   HGB 14.6  --  11.8*   Inpatient medications:  amLODipine  5 mg Oral Daily   aspirin EC  81 mg Oral Daily   atorvastatin  40 mg Oral QPM   calcitRIOL  0.25 mcg Oral Q T,Th,Sa-HD   calcium carbonate  500 mg Oral QHS   Chlorhexidine Gluconate Cloth  6 each Topical Q0600   clopidogrel  75 mg Oral Daily   fluticasone furoate-vilanterol  1 puff Inhalation Daily   heparin injection (subcutaneous)  5,000 Units Subcutaneous Q8H   metoprolol succinate  25 mg Oral Daily   oseltamivir  30 mg Oral Q T,Th,Sa-HD   pantoprazole  40 mg Oral Daily   predniSONE  40 mg Oral Q breakfast   sucroferric oxyhydroxide  1,000 mg Oral TID WC   tamsulosin  0.4 mg Oral QHS  umeclidinium bromide  1 puff Inhalation Daily    acetaminophen, albuterol, ondansetron **OR** ondansetron (ZOFRAN) IV, oxyCODONE-acetaminophen

## 2021-11-26 NOTE — Social Work (Signed)
CSW acknowledges consult for SNF/HH. The patient will require PT/OT evaluations. TOC will assist with disposition planning once the evaluations have been completed.    CSW will continue to follow.

## 2021-11-26 NOTE — Plan of Care (Signed)
  Problem: Education: Goal: Knowledge of General Education information will improve Description: Including pain rating scale, medication(s)/side effects and non-pharmacologic comfort measures Outcome: Progressing   Problem: Health Behavior/Discharge Planning: Goal: Ability to manage health-related needs will improve Outcome: Progressing   Problem: Clinical Measurements: Goal: Ability to maintain clinical measurements within normal limits will improve Outcome: Progressing Goal: Will remain free from infection Outcome: Progressing Goal: Diagnostic test results will improve Outcome: Progressing Goal: Respiratory complications will improve Outcome: Progressing Goal: Cardiovascular complication will be avoided Outcome: Progressing   Problem: Activity: Goal: Risk for activity intolerance will decrease Outcome: Progressing   Problem: Nutrition: Goal: Adequate nutrition will be maintained Outcome: Progressing   Problem: Coping: Goal: Level of anxiety will decrease Outcome: Progressing   Problem: Elimination: Goal: Will not experience complications related to bowel motility Outcome: Progressing Goal: Will not experience complications related to urinary retention Outcome: Progressing   Problem: Pain Managment: Goal: General experience of comfort will improve Outcome: Progressing   Problem: Safety: Goal: Ability to remain free from injury will improve Outcome: Progressing   Problem: Skin Integrity: Goal: Risk for impaired skin integrity will decrease Outcome: Progressing   Problem: Activity: Goal: Ability to tolerate increased activity will improve Outcome: Progressing Goal: Will verbalize the importance of balancing activity with adequate rest periods Outcome: Progressing   Problem: Respiratory: Goal: Ability to maintain a clear airway will improve Outcome: Progressing Goal: Levels of oxygenation will improve Outcome: Progressing Goal: Ability to maintain adequate  ventilation will improve Outcome: Progressing   

## 2021-11-26 NOTE — Progress Notes (Signed)
Progress Note  Patient Name: Kerry Underwood Date of Encounter: 11/26/2021  [facility] Cardiologist: None   Subjective   Breathing worse today. No chest pain. Hoping to go home soon.  Echo with normal EF, no WMA, moderate AS, G2DD  Inpatient Medications    Scheduled Meds:  amLODipine  5 mg Oral Daily   aspirin EC  81 mg Oral Daily   atorvastatin  40 mg Oral QPM   calcitRIOL  0.25 mcg Oral Q T,Th,Sa-HD   calcium carbonate  500 mg Oral QHS   Chlorhexidine Gluconate Cloth  6 each Topical Q0600   clopidogrel  75 mg Oral Daily   fluticasone furoate-vilanterol  1 puff Inhalation Daily   heparin injection (subcutaneous)  5,000 Units Subcutaneous Q8H   ipratropium-albuterol  3 mL Nebulization BID   metoprolol tartrate  25 mg Oral BID   oseltamivir  30 mg Oral Q T,Th,Sa-HD   pantoprazole  40 mg Oral Daily   predniSONE  40 mg Oral Q breakfast   sucroferric oxyhydroxide  1,000 mg Oral TID WC   tamsulosin  0.4 mg Oral QHS   umeclidinium bromide  1 puff Inhalation Daily   Continuous Infusions:  PRN Meds: acetaminophen, albuterol, ondansetron **OR** ondansetron (ZOFRAN) IV, oxyCODONE-acetaminophen   Vital Signs    Vitals:   11/25/21 2148 11/26/21 0001 11/26/21 0339 11/26/21 0521  BP:  112/61 (!) 108/54   Pulse: 79 78 71   Resp: _0 Temp:  97.9 F (36.6 C) 98.1 F (36.7 C)   TempSrc:  Oral Oral   SpO2:  91% 90%   Weight:    77.8 kg  Height:        Intake/Output Summary (Last 24 hours) at 11/26/2021 0604 Last data filed at 11/26/2021 0001 Gross per 24 hour  Intake 377.98 ml  Output 3500 ml  Net -3122.02 ml   Last 3 Weights 11/26/2021 11/25/2021 11/25/2021  Weight (lbs) 171 lb 8.3 oz 169 lb 5 oz 177 lb 4 oz  Weight (kg) 77.8 kg 76.8 kg 80.4 kg      Telemetry    NSR - Personally Reviewed  ECG    No new ECG - Personally Reviewed  Physical Exam   GEN: Sitting in bed, mildly tachypneic  Neck: No JVD Cardiac: RRR, 2/6 systolic murmur Respiratory:  Poor air movement, scattered wheezing GI: Soft, nontender, non-distended  MS: No edema; No deformity. Neuro:  Nonfocal  Psych: Normal affect   Labs    High Sensitivity Troponin:   Recent Labs  Lab 11/24/21 1620 11/24/21 1749 11/25/21 0430 11/25/21 0559  TROPONINIHS 540* 546* 452* 427*     Chemistry Recent Labs  Lab 11/24/21 1620 11/25/21 0559  NA 133* 133*  K 5.3* 5.0  CL 92* 96*  CO2 26 20*  GLUCOSE 124* 129*  BUN 41* 58*  CREATININE 6.65* 7.55*  CALCIUM 7.6* 7.3*  GFRNONAA 8* 7*  ANIONGAP 15 17*    Lipids No results for input(s): CHOL, TRIG, HDL, LABVLDL, LDLCALC, CHOLHDL in the last 168 hours.  Hematology Recent Labs  Lab 11/24/21 1620 11/26/21 0026  WBC 6.8 5.1  RBC 5.05 4.16*  HGB 14.6 11.8*  HCT 48.5 37.4*  MCV 96.0 89.9  MCH 28.9 28.4  MCHC 30.1 31.6  RDW 17.2* 16.4*  PLT 154 172   Thyroid No results for input(s): TSH, FREET4 in the last 168 hours.  BNPNo results for input(s): BNP, PROBNP in the last 168 hours.  DDimer No results for  input(s): DDIMER in the last 168 hours.   Radiology    DG Chest Port 1 View  Result Date: 11/24/2021 CLINICAL DATA:  Chest pain with shortness of breath. EXAM: PORTABLE CHEST 1 VIEW COMPARISON:  Chest x-ray 08/23/2021. FINDINGS: The heart is mildly enlarged, unchanged. There is pulmonary vascular congestion. There is a small right pleural effusion. No pneumothorax. Right-sided central venous catheter tip projects over the mid SVC. There is a healed left clavicular fracture. IMPRESSION: 1. Cardiomegaly with central pulmonary vascular congestion. 2. Small right pleural effusion. Electronically Signed   By: Ronney Asters M.D.   On: 11/24/2021 16:09   ECHOCARDIOGRAM COMPLETE  Result Date: 11/25/2021    ECHOCARDIOGRAM REPORT   Patient Name:   Kerry Underwood Date of Exam: 11/25/2021 Medical Rec #:  [id number]    Height:       65.0 in Accession #:    [id number]   Weight:       170.6 lb Date of Birth:  January 13, 1951    BSA:           1.849 m Patient Age:    70 years     BP:           118/68 mmHg Patient Gender: M            HR:           73 bpm. Exam Location:  Inpatient Procedure: 2D Echo, Cardiac Doppler and Color Doppler Indications:    NSTEMI  History:        Patient has no prior history of Echocardiogram examinations.                 Signs/Symptoms:Shortness of Breath.  Sonographer:    Merrie Roof RDCS Referring Phys: [place]  1. Left ventricular ejection fraction, by estimation, is 60 to 65%. The left ventricle has normal function. The left ventricle has no regional wall motion abnormalities. There is mild left ventricular hypertrophy. Left ventricular diastolic parameters are consistent with Grade II diastolic dysfunction (pseudonormalization).  2. Right ventricular systolic function is normal. The right ventricular size is normal. There is mildly elevated pulmonary artery systolic pressure. The estimated right ventricular systolic pressure is 20.2 mmHg.  3. Left atrial size was mildly dilated.  4. The mitral valve is degenerative. Mild mitral valve regurgitation. Mild calcific mitral stenosis. Mean gradient 650mHg at HR 71; MVA 1.9cm2 by continuity. Severe mitral annular calcification.  5. The aortic valve is calcified. There is moderate-to-severe calcification of the aortic valve. There is moderate-to-severe thickening of the aortic valve. Aortic valve regurgitation is not visualized. Moderate aortic valve stenosis. AVA 0.91cm2, mean gradient 272mg, peak gradient 4535m, Vmax 3.50m/19mDI 0.3.  6. The inferior vena cava is dilated in size with >50% respiratory variability, suggesting right atrial pressure of 8 mmHg. Comparison(s): No prior Echocardiogram. FINDINGS  Left Ventricle: Left ventricular ejection fraction, by estimation, is 60 to 65%. The left ventricle has normal function. The left ventricle has no regional wall motion abnormalities. The left ventricular internal cavity size was normal in size. There  is  mild left ventricular hypertrophy. Left ventricular diastolic parameters are consistent with Grade II diastolic dysfunction (pseudonormalization). Right Ventricle: The right ventricular size is normal. No increase in right ventricular wall thickness. Right ventricular systolic function is normal. There is mildly elevated pulmonary artery systolic pressure. The tricuspid regurgitant velocity is 3.00  m/s, and with an assumed right atrial pressure of 3 mmHg, the estimated right ventricular systolic pressure  is 39.0 mmHg. Left Atrium: Left atrial size was mildly dilated. Right Atrium: Right atrial size was normal in size. Pericardium: There is no evidence of pericardial effusion. Mitral Valve: The mitral valve is degenerative in appearance. There is mild thickening of the mitral valve leaflet(s). There is moderate calcification of the mitral valve leaflet(s). Severe mitral annular calcification. Mild mitral valve regurgitation. Mild mitral valve stenosis. Tricuspid Valve: The tricuspid valve is normal in structure. Tricuspid valve regurgitation is mild. Aortic Valve: There is moderate calcification of the aortic valve. There is moderate thickening of the aortic valve. Aortic valve regurgitation is not visualized. Moderate aortic stenosis is present. AVA 0.91cm2, mean gradient 67mHg, peak gradient 441mg, Vmax 3.40m62m DI 0.3. The aortic valve is calcified.  Pulmonic Valve: The pulmonic valve was normal in structure. Pulmonic valve regurgitation is trivial. Aorta: The aortic root is normal in size and structure. Venous: The inferior vena cava is dilated in size with greater than 50% respiratory variability, suggesting right atrial pressure of 8 mmHg. IAS/Shunts: The atrial septum is grossly normal.  LEFT VENTRICLE PLAX 2D LVIDd:         4.60 cm   Diastology LVIDs:         3.10 cm   LV e' medial:    8.94 cm/s LV PW:         1.30 cm   LV E/e' medial:  17.0 LV IVS:        1.10 cm   LV e' lateral:   9.38 cm/s LVOT diam:      2.10 cm   LV E/e' lateral: 16.2 LV SV:         187 LV SV Index:   101 LVOT Area:     3.46 cm  RIGHT VENTRICLE             IVC RV S prime:     10.70 cm/s  IVC diam: 2.10 cm TAPSE (M-mode): 1.2 cm LEFT ATRIUM             Index        RIGHT ATRIUM           Index LA diam:        4.30 cm 2.33 cm/m   RA Area:     19.10 cm LA Vol (A2C):   65.4 ml 35.37 ml/m  RA Volume:   52.20 ml  28.23 ml/m LA Vol (A4C):   75.6 ml 40.89 ml/m LA Biplane Vol: 74.1 ml 40.07 ml/m  AORTIC VALVE AV Area (Vmax):    2.37 cm AV Area (Vmean):   2.40 cm AV Area (VTI):     2.48 cm AV Vmax:           328.50 cm/s AV Vmean:          236.500 cm/s AV VTI:            0.752 m AV Peak Grad:      43.2 mmHg AV Mean Grad:      25.0 mmHg LVOT Vmax:         225.00 cm/s LVOT Vmean:        164.150 cm/s LVOT VTI:          0.538 m LVOT/AV VTI ratio: 0.72  AORTA Ao Root diam: 3.10 cm Ao Asc diam:  3.60 cm MITRAL VALVE                TRICUSPID VALVE MV Area (PHT): 3.12 cm     TR Peak grad:  36.0 mmHg MV Decel Time: 243 msec     TR Vmax:        300.00 cm/s MV E velocity: 152.00 cm/s MV A velocity: 116.00 cm/s  SHUNTS MV E/A ratio:  1.31         Systemic VTI:  0.54 m                             Systemic Diam: 2.10 cm Gwyndolyn Kaufman MD Electronically signed by Gwyndolyn Kaufman MD Signature Date/Time: 11/25/2021/3:35:30 PM    Final     Cardiac Studies   TTE 11/25/21: IMPRESSIONS     1. Left ventricular ejection fraction, by estimation, is 60 to 65%. The  left ventricle has normal function. The left ventricle has no regional  wall motion abnormalities. There is mild left ventricular hypertrophy.  Left ventricular diastolic parameters  are consistent with Grade II diastolic dysfunction (pseudonormalization).   2. Right ventricular systolic function is normal. The right ventricular  size is normal. There is mildly elevated pulmonary artery systolic  pressure. The estimated right ventricular systolic pressure is 93.8 mmHg.   3. Left atrial  size was mildly dilated.   4. The mitral valve is degenerative. Mild mitral valve regurgitation.  Mild calcific mitral stenosis. Mean gradient 62mHg at HR 71; MVA 1.9cm2 by  continuity. Severe mitral annular calcification.   5. The aortic valve is calcified. There is moderate-to-severe  calcification of the aortic valve. There is moderate-to-severe thickening  of the aortic valve. Aortic valve regurgitation is not visualized.  Moderate aortic valve stenosis. AVA 0.91cm2, mean  gradient 272mg, peak gradient 4545m, Vmax 3.2m/27mDI 0.3.   6. The inferior vena cava is dilated in size with >50% respiratory  variability, suggesting right atrial pressure of 8 mmHg.   Patient Profile     70 y44. male with history of  hypertension, PAD, COPD with ongoing tobacco smoking, and end-stage renal disease on hemodialysis (TThSat) who presented to the ER with hypoxia and SOB found to be influenza A positive. Cardiology is consulted for elevated troponin 540>427.  Assessment & Plan    #Elevated Troponin: Suspect demand in the setting of influenza A infection and COPD exacerbation. Prior to this episode, the patient was active without anginal symptoms. TTE here with normal EF, no WMA, G2DD, severe MAC and moderate AS.  -Will defer ischemic work-up to out-patient -Continue ASA 81mg52mly, plavix 75mg 43my -Stopped heparin gtt -Continue lipitor 40mg d69m -Change metop tartrate to 25mg XL32mly  #Influenza A: #COPD Exacerbation: -Management per primary  #Moderate AS: AVA 0.91cm2, mean gradient 27mmHg, 81m gradient 45mmHg, V72m3.2m/s, DI 038m Will need continued surveillance as out-patient. -Will need serial echoes for monitoring  #PAD: -Continue ASA, plavix and lipitor as above  #ESRD: -On HD T, Th, S  #HTN: -Continue home amlodipine 5mg daily -45mop 25mg XL dail58mHLD: -Continue lipitor 40mg daily as80mve   Cardiology will sign-off. [place]   For questions or updates, please contact [facility] [email or web address] for contact info under        Signed, Talbert Trembath E PembFreada Bergeron22, 6:04 AM

## 2021-11-26 NOTE — Progress Notes (Addendum)
PROGRESS NOTE   Kerry Underwood  UUV:[id number]    DOB: 06-23-1951    DOA: 11/24/2021  PCP: [name], FNP   I have briefly reviewed patients previous medical records in [facility].  Chief Complaint  Patient presents with   Shortness of Breath    Brief Narrative:  70 year old male, lives alone, independent, medical history significant for chronic diastolic CHF (2D echo 03/16/4258 in [place] with LVEF 55-60%), COPD, ongoing tobacco use, hypertension, ESRD on TTS HD, anemia in ESRD, PAD with intervention, stroke, presented initially to [facility] ED on 11/24/2021 with complaints of progressive dyspnea, cough, fever for which his home inhalers were not helping.  Seen by his PCP and noted to be hypoxic to 60% on room air.  Admitted for influenza A acute bronchitis and NSTEMI.  Case discussed with cardiologist at [facility] and as per recommendations transferred to [facility] for further evaluation by them and IV heparin initiated.  Cardiology have determined that troponin elevation is more from demand ischemia, echo unremarkable, plan outpatient work-up, IV heparin discontinued.   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  Denies prior history of MI or CAD.  No chest pain.  HS Troponin cycled and peaked at 546 and trending down (540 > 546 > 452 > 427).  Started on IV heparin.  2D echo: LVEF 60-65%, no regional wall motion abnormalities and grade 2 diastolic dysfunction moderate aortic valve stenosis.  Continue atorvastatin, Plavix and metoprolol  tartrate.   Cardiology have determined that troponin elevation is more from demand ischemia, echo unremarkable, plan outpatient work-up, IV heparin discontinued.  Influenza A acute bronchitis with COPD exacerbation: Tobacco cessation counseled.  Continue IV>p.o. steroids, Tamiflu, bronchodilator nebulization scheduled and as needed, Breo Ellipta and Incruse Ellipta and added flutter valve.  Respiratory status appears worse today.  Chest x-ray personally reviewed and without acute findings.  Added doxycycline for possible superadded bacterial infection.  Tobacco abuse: Cessation counseled.  Patient not interested in quitting.  Reports that he has been smoking for 62 years.  Acute respiratory failure with hypoxia: Oxygen saturations in the 60s at PCPs office.  Was able to wean off to room air yesterday evening but reports worsening dyspnea and back on oxygen 2 L/min.  Titrate oxygen as needed to maintain saturations between 89-92%.  Will need evaluation for home oxygen needs prior to discharge.  Essential hypertension: Controlled on amlodipine and metoprolol  Hyperlipidemia: Continue statins  ESRD on TTS HD: Follows with Dr. Marval Regal, [facility].  Consulted nephrology for HD today.  Mild hyperkalemia resolved.  Reports that he had about 3.5 hours of HD on 12/17 and approximately 3.5 L removed.  Peripherally does not appear volume overloaded.  Check chest x-ray without acute abnormalities.  Anemia in ESRD: Last hemoglobin in system was in the 8 g range.  Presented with hemoglobin of 14.6 which was likely erroneously high.  Down to 11.8.  Chronic diastolic CHF: Volume management across HD.  Chest x-ray suggestive of pulmonary edema.  Management as above.  Moderate  Aortic Stenosis: OP follow up with serial Echo's  GERD: Continue PPI.  PAD: Follows with Dr. Virl Cagey and had interventions on his right lower extremity in October 2022.  Had partial amputation of the second right toe,  femoral endarterectomy with patch angioplasty 09/22/2021.  Continue Plavix and statins.  Tobacco cessation was counseled.  Body mass index is 28.54 kg/m.     DVT prophylaxis: heparin injection 5,000 Units Start: 11/25/21 2200     Code Status: DNR Family Communication: None at bedside Disposition:  Status is: Inpatient  Remains inpatient appropriate because: COPD exacerbation with worsening respiratory status today needing close inpatient evaluation and management.        Consultants:   Cardiology Nephrology  Procedures:   HD  Antimicrobials:      Subjective:  Patient states that his breathing had significantly improved and he was able to come off of dialysis yesterday afternoon.  However since HD, dyspnea has progressively worsened and had to go back on oxygen last night.  Reports worsening dyspnea compared to yesterday.  Intermittent cough, mostly nonproductive.  No chest pain.  Objective:   Vitals:   11/26/21 0339 11/26/21 0521 11/26/21 0748 11/26/21 0846  BP: (!) 108/54  135/71   Pulse: 71  81   Resp: 18  18   Temp: 98.1 F (36.7 C)  98 F (36.7 C)   TempSrc: Oral  Oral   SpO2: 90%  92% 98%  Weight:  77.8 kg    Height:        General exam: Early male moderately built and nourished lying propped up in bed, looks worse than yesterday, mild respiratory distress with audible wheezing/rattling of chest Respiratory system: Decreased breath sounds bilaterally with scattered bilateral medium pitched expiratory rhonchi and occasional basal crackles.  Mild increased work of breathing.  Right upper anterior chest HD catheter in place. Cardiovascular system: S1 and S2 heard, RRR.  No JVD, murmurs or pedal edema. Gastrointestinal system: Abdomen is nondistended, soft and nontender. No organomegaly or masses felt. Normal bowel sounds heard. Central nervous system: Alert and oriented. No focal neurological deficits. Extremities: Symmetric 5 x 5 power.  Right lower  extremity medial aspect of leg and thigh with vascular intervention surgical scar. Skin: No rashes, lesions or ulcers Psychiatry: Judgement and insight appear normal. Mood & affect appropriate.     Data Reviewed:   I have personally reviewed following labs and imaging studies   CBC: Recent Labs  Lab 11/24/21 1620 11/26/21 0026  WBC 6.8 5.1  NEUTROABS 5.9  --   HGB 14.6 11.8*  HCT 48.5 37.4*  MCV 96.0 89.9  PLT 154 161    Basic Metabolic Panel: Recent Labs  Lab 11/24/21 1620 11/25/21 0559  NA 133* 133*  K 5.3* 5.0  CL 92* 96*  CO2 26 20*  GLUCOSE 124* 129*  BUN 41* 58*  CREATININE 6.65* 7.55*  CALCIUM 7.6* 7.3*    Liver Function Tests: No results for input(s): AST, ALT, ALKPHOS, BILITOT, PROT, ALBUMIN in the last 168 hours.  CBG: No results for input(s): GLUCAP in the last 168 hours.  Microbiology Studies:   Recent Results (from the past 240 hour(s))  Resp Panel by RT-PCR (Flu A&B, Covid)     Status: Abnormal   Collection Time: 11/24/21  4:20 PM  Result Value Ref Range Status   SARS Coronavirus 2 by RT PCR NEGATIVE NEGATIVE Final    Comment: (NOTE) SARS-CoV-2 target nucleic acids are NOT DETECTED.  The SARS-CoV-2 RNA is generally  detectable in upper respiratory specimens during the acute phase of infection. The lowest concentration of SARS-CoV-2 viral copies this assay can detect is 138 copies/mL. A negative result does not preclude SARS-Cov-2 infection and should not be used as the sole basis for treatment or other patient management decisions. A negative result may occur with  improper specimen collection/handling, submission of specimen other than nasopharyngeal swab, presence of viral mutation(s) within the areas targeted by this assay, and inadequate number of viral copies(<138 copies/mL). A negative result must be combined with clinical observations, patient history, and epidemiological information. The expected result is Negative.  Fact Sheet  for Patients:  EntrepreneurPulse.com.au  Fact Sheet for Healthcare Providers:  IncredibleEmployment.be  This test is no t yet approved or cleared by the [place] FDA and  has been authorized for detection and/or diagnosis of SARS-CoV-2 by FDA under an Emergency Use Authorization (EUA). This EUA will remain  in effect (meaning this test can be used) for the duration of the COVID-19 declaration under Section 564(b)(1) of the Act, 21 U.S.C.section 360bbb-3(b)(1), unless the authorization is terminated  or revoked sooner.       Influenza A by PCR POSITIVE (A) NEGATIVE Final   Influenza B by PCR NEGATIVE NEGATIVE Final    Comment: (NOTE) The Xpert Xpress SARS-CoV-2/FLU/RSV plus assay is intended as an aid in the diagnosis of influenza from Nasopharyngeal swab specimens and should not be used as a sole basis for treatment. Nasal washings and aspirates are unacceptable for Xpert Xpress SARS-CoV-2/FLU/RSV testing.  Fact Sheet for Patients: EntrepreneurPulse.com.au  Fact Sheet for Healthcare Providers: IncredibleEmployment.be  This test is not yet approved or cleared by the [place] FDA and has been authorized for detection and/or diagnosis of SARS-CoV-2 by FDA under an Emergency Use Authorization (EUA). This EUA will remain in effect (meaning this test can be used) for the duration of the COVID-19 declaration under Section 564(b)(1) of the Act, 21 U.S.C. section 360bbb-3(b)(1), unless the authorization is terminated or revoked.  Performed at [facility], [address], [place], [place] [address]   MRSA Next Gen by PCR, Nasal     Status: None   Collection Time: 11/24/21 10:46 PM   Specimen: Nasal Mucosa; Nasal Swab  Result Value Ref Range Status   MRSA by PCR Next Gen NOT DETECTED NOT DETECTED Final    Comment: (NOTE) The GeneXpert MRSA Assay (FDA approved for NASAL specimens only), is one  component of a comprehensive MRSA colonization surveillance program. It is not intended to diagnose MRSA infection nor to guide or monitor treatment for MRSA infections. Test performance is not FDA approved in patients less than 21 years old. Performed at [facility], [place] [address] [place], [place] [address]     Radiology Studies:  DG Chest Port 1 View  Result Date: 11/24/2021 CLINICAL DATA:  Chest pain with shortness of breath. EXAM: PORTABLE CHEST 1 VIEW COMPARISON:  Chest x-ray 08/23/2021. FINDINGS: The heart is mildly enlarged, unchanged. There is pulmonary vascular congestion. There is a small right pleural effusion. No pneumothorax. Right-sided central venous catheter tip projects over the mid SVC. There is a healed left clavicular fracture. IMPRESSION: 1. Cardiomegaly with central pulmonary vascular congestion. 2. Small right pleural effusion. Electronically Signed   By: Ronney Asters M.D.   On: 11/24/2021 16:09   ECHOCARDIOGRAM COMPLETE  Result Date: 11/25/2021    ECHOCARDIOGRAM REPORT   Patient Name:   JEOVANNY CUADROS Date of Exam: 11/25/2021 Medical Rec #:  [id number]  Height:       65.0 in Accession #:    [id number]   Weight:       170.6 lb Date of Birth:  12/02/1951    BSA:          1.849 m Patient Age:    88 years     BP:           118/68 mmHg Patient Gender: M            HR:           73 bpm. Exam Location:  Inpatient Procedure: 2D Echo, Cardiac Doppler and Color Doppler Indications:    NSTEMI  History:        Patient has no prior history of Echocardiogram examinations.                 Signs/Symptoms:Shortness of Breath.  Sonographer:    Merrie Roof RDCS Referring Phys: [place]  1. Left ventricular ejection fraction, by estimation, is 60 to 65%. The left ventricle has normal function. The left ventricle has no regional wall motion abnormalities. There is mild left ventricular hypertrophy. Left ventricular diastolic parameters are consistent with Grade  II diastolic dysfunction (pseudonormalization).  2. Right ventricular systolic function is normal. The right ventricular size is normal. There is mildly elevated pulmonary artery systolic pressure. The estimated right ventricular systolic pressure is 28.3 mmHg.  3. Left atrial size was mildly dilated.  4. The mitral valve is degenerative. Mild mitral valve regurgitation. Mild calcific mitral stenosis. Mean gradient 11mmHg at HR 71; MVA 1.9cm2 by continuity. Severe mitral annular calcification.  5. The aortic valve is calcified. There is moderate-to-severe calcification of the aortic valve. There is moderate-to-severe thickening of the aortic valve. Aortic valve regurgitation is not visualized. Moderate aortic valve stenosis. AVA 0.91cm2, mean gradient 74mmHg, peak gradient 46mmHg, Vmax 3.75m/s, DI 0.3.  6. The inferior vena cava is dilated in size with >50% respiratory variability, suggesting right atrial pressure of 8 mmHg. Comparison(s): No prior Echocardiogram. FINDINGS  Left Ventricle: Left ventricular ejection fraction, by estimation, is 60 to 65%. The left ventricle has normal function. The left ventricle has no regional wall motion abnormalities. The left ventricular internal cavity size was normal in size. There is  mild left ventricular hypertrophy. Left ventricular diastolic parameters are consistent with Grade II diastolic dysfunction (pseudonormalization). Right Ventricle: The right ventricular size is normal. No increase in right ventricular wall thickness. Right ventricular systolic function is normal. There is mildly elevated pulmonary artery systolic pressure. The tricuspid regurgitant velocity is 3.00  m/s, and with an assumed right atrial pressure of 3 mmHg, the estimated right ventricular systolic pressure is 66.2 mmHg. Left Atrium: Left atrial size was mildly dilated. Right Atrium: Right atrial size was normal in size. Pericardium: There is no evidence of pericardial effusion. Mitral Valve: The  mitral valve is degenerative in appearance. There is mild thickening of the mitral valve leaflet(s). There is moderate calcification of the mitral valve leaflet(s). Severe mitral annular calcification. Mild mitral valve regurgitation. Mild mitral valve stenosis. Tricuspid Valve: The tricuspid valve is normal in structure. Tricuspid valve regurgitation is mild. Aortic Valve: There is moderate calcification of the aortic valve. There is moderate thickening of the aortic valve. Aortic valve regurgitation is not visualized. Moderate aortic stenosis is present. AVA 0.91cm2, mean gradient 29mmHg, peak gradient 4mmHg, Vmax 3.12m/s, DI 0.3. The aortic valve is calcified.  Pulmonic Valve: The pulmonic valve was normal in structure. Pulmonic valve  regurgitation is trivial. Aorta: The aortic root is normal in size and structure. Venous: The inferior vena cava is dilated in size with greater than 50% respiratory variability, suggesting right atrial pressure of 8 mmHg. IAS/Shunts: The atrial septum is grossly normal.  LEFT VENTRICLE PLAX 2D LVIDd:         4.60 cm   Diastology LVIDs:         3.10 cm   LV e' medial:    8.94 cm/s LV PW:         1.30 cm   LV E/e' medial:  17.0 LV IVS:        1.10 cm   LV e' lateral:   9.38 cm/s LVOT diam:     2.10 cm   LV E/e' lateral: 16.2 LV SV:         187 LV SV Index:   101 LVOT Area:     3.46 cm  RIGHT VENTRICLE             IVC RV S prime:     10.70 cm/s  IVC diam: 2.10 cm TAPSE (M-mode): 1.2 cm LEFT ATRIUM             Index        RIGHT ATRIUM           Index LA diam:        4.30 cm 2.33 cm/m   RA Area:     19.10 cm LA Vol (A2C):   65.4 ml 35.37 ml/m  RA Volume:   52.20 ml  28.23 ml/m LA Vol (A4C):   75.6 ml 40.89 ml/m LA Biplane Vol: 74.1 ml 40.07 ml/m  AORTIC VALVE AV Area (Vmax):    2.37 cm AV Area (Vmean):   2.40 cm AV Area (VTI):     2.48 cm AV Vmax:           328.50 cm/s AV Vmean:          236.500 cm/s AV VTI:            0.752 m AV Peak Grad:      43.2 mmHg AV Mean Grad:       25.0 mmHg LVOT Vmax:         225.00 cm/s LVOT Vmean:        164.150 cm/s LVOT VTI:          0.538 m LVOT/AV VTI ratio: 0.72  AORTA Ao Root diam: 3.10 cm Ao Asc diam:  3.60 cm MITRAL VALVE                TRICUSPID VALVE MV Area (PHT): 3.12 cm     TR Peak grad:   36.0 mmHg MV Decel Time: 243 msec     TR Vmax:        300.00 cm/s MV E velocity: 152.00 cm/s MV A velocity: 116.00 cm/s  SHUNTS MV E/A ratio:  1.31         Systemic VTI:  0.54 m                             Systemic Diam: 2.10 cm Gwyndolyn Kaufman MD Electronically signed by Gwyndolyn Kaufman MD Signature Date/Time: 11/25/2021/3:35:30 PM    Final     Scheduled Meds:    aspirin EC  81 mg Oral Daily   atorvastatin  40 mg Oral QPM   calcitRIOL  0.25 mcg Oral Q T,Th,Sa-HD   calcium  carbonate  500 mg Oral QHS   Chlorhexidine Gluconate Cloth  6 each Topical Q0600   clopidogrel  75 mg Oral Daily   fluticasone furoate-vilanterol  1 puff Inhalation Daily   heparin injection (subcutaneous)  5,000 Units Subcutaneous Q8H   ipratropium-albuterol  3 mL Nebulization TID   oseltamivir  30 mg Oral Q T,Th,Sa-HD   pantoprazole  40 mg Oral Daily   predniSONE  40 mg Oral Q breakfast   sucroferric oxyhydroxide  1,000 mg Oral TID WC   tamsulosin  0.4 mg Oral QHS   umeclidinium bromide  1 puff Inhalation Daily    Continuous Infusions:       LOS: 2 days     Vernell Leep, MD,  FACP, [facility], [facility], [facility] (Care Management Physician Certified) [place]  To contact the attending provider between 7A-7P or the covering provider during after hours 7P-7A, please log into the web site [email or web address] and access using universal [place] password for that web site. If you do not have the password, please call the hospital operator.  11/26/2021, 9:41 AM

## 2021-11-27 ENCOUNTER — Encounter: Payer: Self-pay | Admitting: Family Medicine

## 2021-11-27 DIAGNOSIS — I5032 Chronic diastolic (congestive) heart failure: Secondary | ICD-10-CM

## 2021-11-27 DIAGNOSIS — J9601 Acute respiratory failure with hypoxia: Secondary | ICD-10-CM

## 2021-11-27 LAB — CBC
HCT: 32.1 % — ABNORMAL LOW (ref 39.0–52.0)
Hemoglobin: 10 g/dL — ABNORMAL LOW (ref 13.0–17.0)
MCH: 28 pg (ref 26.0–34.0)
MCHC: 31.2 g/dL (ref 30.0–36.0)
MCV: 89.9 fL (ref 80.0–100.0)
Platelets: 149 10*3/uL — ABNORMAL LOW (ref 150–400)
RBC: 3.57 MIL/uL — ABNORMAL LOW (ref 4.22–5.81)
RDW: 16.4 % — ABNORMAL HIGH (ref 11.5–15.5)
WBC: 5.2 10*3/uL (ref 4.0–10.5)
nRBC: 0 % (ref 0.0–0.2)

## 2021-11-27 NOTE — Progress Notes (Signed)
PROGRESS NOTE   Kerry Underwood  PPI:[id number]    DOB: 08/24/1951    DOA: 11/24/2021  PCP: [name], FNP   I have briefly reviewed patients previous medical records in [facility].  Chief Complaint  Patient presents with   Shortness of Breath    Brief Narrative:  70 year old male, lives alone, independent, medical history significant for chronic diastolic CHF (2D echo 0/05/3015 in [place] with LVEF 55-60%), COPD, ongoing tobacco use, hypertension, ESRD on TTS HD, anemia in ESRD, PAD with intervention, stroke, presented initially to [facility] ED on 11/24/2021 with complaints of progressive dyspnea, cough, fever for which his home inhalers were not helping.  Seen by his PCP and noted to be hypoxic to 60% on room air.  Admitted for influenza A acute bronchitis and NSTEMI.  Case discussed with cardiologist at [facility] and as per recommendations transferred to [facility] for further evaluation by them and IV heparin initiated.  Cardiology have determined that troponin elevation is more from demand ischemia, echo unremarkable, plan outpatient work-up, IV heparin discontinued.  Continues to have issues with COPD and wheezing.  Suspect he has very advanced COPD given history of 62 years of smoking.  Pulmonology consulted for assistance.   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  Denies prior history of MI or CAD.  No chest pain.  HS Troponin cycled and peaked at 546 and trending down (540 > 546 > 452 > 427).  Started on IV heparin.  2D echo:  LVEF 60-65%, no regional wall motion abnormalities and grade 2 diastolic dysfunction moderate aortic valve stenosis.  Continue atorvastatin, aspirin, Plavix and metoprolol tartrate changed to succinate.   Cardiology have determined that troponin elevation is more from demand ischemia, echo unremarkable, plan outpatient work-up, IV heparin discontinued.  Cardiology have signed off and will arrange outpatient follow-up for further evaluation.  Influenza A acute bronchitis with COPD exacerbation: Tobacco cessation counseled.  Continue IV>p.o. steroids, Tamiflu, bronchodilator nebulization scheduled and as needed, Breo Ellipta and Incruse Ellipta and added flutter valve.  Respiratory status has improved compared to yesterday but still has DOE on speaking, diminished breath sounds and few wheezes on lung exam.  Chest x-ray 12/18 personally reviewed and without acute findings.  Added doxycycline for possible superadded bacterial infection but patient does not want to take due to side effects with antibiotics that cause him to have profuse diarrhea.  We will consult pulmonology for assistance given advanced COPD  Tobacco abuse: Cessation counseled.  Patient not interested in quitting.  Reports that he has been smoking for 62 years.  Acute respiratory failure with hypoxia: Oxygen saturations in the 60s at PCPs office.  Has been on and off oxygen while hospitalized.  Currently weaned off to room air.  We will need to assess home oxygen needs prior to discharge.  Essential hypertension: Controlled on amlodipine and metoprolol  Hyperlipidemia: Continue statins  ESRD on TTS HD: Follows with Dr. Marval Regal, [facility].  Consulted nephrology for HD today.  Mild hyperkalemia resolved.  Reports  that he had about 3.5 hours of HD on 12/17 and approximately 3.5 L removed.  Peripherally does not appear volume overloaded.  Check chest x-ray without acute abnormalities.  Next HD tomorrow 12/20.  Anemia in  ESRD: Last hemoglobin in system was in the 8 g range.  Presented with hemoglobin of 14.6 which was likely erroneously high.  Although hemoglobin is dropped to 10, still suspect within his baseline.  Follow CBC in AM.  Mild thrombocytopenia noted  Chronic diastolic CHF: Volume management across HD.  Clinically euvolemic and chest x-ray 12/18 without pulmonary edema.  Moderate Aortic Stenosis: OP follow up with serial Echo's  GERD: Continue PPI.  PAD: Follows with Dr. Virl Cagey and had interventions on his right lower extremity in October 2022.  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  Continue Plavix and statins.  Tobacco cessation was counseled.  Body mass index is 27.85 kg/m.     DVT prophylaxis: heparin injection 5,000 Units Start: 11/25/21 2200     Code Status: DNR Family Communication: None at bedside Disposition:  Status is: Inpatient  Remains inpatient appropriate because: COPD exacerbation with worsening respiratory status today needing close inpatient evaluation and management.  Ongoing dyspnea on exertion with wheezing despite maximal COPD treatment.  Consulted pulmonology for assistance.        Consultants:   Cardiology Nephrology Pulmonology  Procedures:   HD  Antimicrobials:      Subjective:  Dyspnea improved since late yesterday morning.  Has been off oxygen.  Reports chronic dyspnea.  Also reports that he has worsening dyspnea and wheezing usually at nights.  Heating at home with firewood.  Objective:   Vitals:   11/27/21 0601 11/27/21 0738 11/27/21 0741 11/27/21 0813  BP:    (!) 145/59  Pulse:  81  92  Resp:  18  19  Temp:    97.8 F (36.6 C)  TempSrc:    Oral  SpO2:  95% 99% 93%  Weight: 75.9 kg     Height:        General exam: Early male moderately built and nourished lying propped up in bed, looks much improved compared to yesterday but still has mild DOE when  speaking. Respiratory system: Improved compared to yesterday but still bilateral diminished breath sounds and scattered few expiratory rhonchi.  No crackles.  DOE on speaking.  Right upper anterior chest HD catheter in place. Cardiovascular system: S1 and S2 heard, RRR.  No JVD, murmurs or pedal edema. Gastrointestinal system: Abdomen is nondistended, soft and nontender. No organomegaly or masses felt. Normal bowel sounds heard. Central nervous system: Alert and oriented. No focal neurological deficits. Extremities: Symmetric 5 x 5 power.  Right lower extremity medial aspect of leg and thigh with vascular intervention surgical scar. Skin: No rashes, lesions or ulcers Psychiatry: Judgement and insight appear normal. Mood & affect appropriate.     Data Reviewed:   I have personally reviewed following labs and imaging studies   CBC: Recent Labs  Lab 11/24/21 1620 11/25/21 0430 11/26/21 0026 11/27/21 0051  WBC 6.8 3.5* 5.1 5.2  NEUTROABS 5.9  --   --   --   HGB 14.6 12.2* 11.8* 10.0*  HCT 48.5 39.6 37.4* 32.1*  MCV 96.0 91.9 89.9 89.9  PLT 154 144* 172 149*    Basic Metabolic Panel: Recent Labs  Lab 11/24/21 1620 11/25/21 0559 11/26/21 0026  NA 133* 133*  --   K 5.3* 5.0  --  CL 92* 96*  --   CO2 26 20*  --   GLUCOSE 124* 129*  --   BUN 41* 58*  --   CREATININE 6.65* 7.55*  --   CALCIUM 7.6* 7.3*  --   PHOS  --   --  5.3*    Liver Function Tests: No results for input(s): AST, ALT, ALKPHOS, BILITOT, PROT, ALBUMIN in the last 168 hours.  CBG: No results for input(s): GLUCAP in the last 168 hours.  Microbiology Studies:   Recent Results (from the past 240 hour(s))  Resp Panel by RT-PCR (Flu A&B, Covid)     Status: Abnormal   Collection Time: 11/24/21  4:20 PM  Result Value Ref Range Status   SARS Coronavirus 2 by RT PCR NEGATIVE NEGATIVE Final    Comment: (NOTE) SARS-CoV-2 target nucleic acids are NOT DETECTED.  The SARS-CoV-2 RNA is generally detectable in  upper respiratory specimens during the acute phase of infection. The lowest concentration of SARS-CoV-2 viral copies this assay can detect is 138 copies/mL. A negative result does not preclude SARS-Cov-2 infection and should not be used as the sole basis for treatment or other patient management decisions. A negative result may occur with  improper specimen collection/handling, submission of specimen other than nasopharyngeal swab, presence of viral mutation(s) within the areas targeted by this assay, and inadequate number of viral copies(<138 copies/mL). A negative result must be combined with clinical observations, patient history, and epidemiological information. The expected result is Negative.  Fact Sheet for Patients:  EntrepreneurPulse.com.au  Fact Sheet for Healthcare Providers:  IncredibleEmployment.be  This test is no t yet approved or cleared by the [place] FDA and  has been authorized for detection and/or diagnosis of SARS-CoV-2 by FDA under an Emergency Use Authorization (EUA). This EUA will remain  in effect (meaning this test can be used) for the duration of the COVID-19 declaration under Section 564(b)(1) of the Act, 21 U.S.C.section 360bbb-3(b)(1), unless the authorization is terminated  or revoked sooner.       Influenza A by PCR POSITIVE (A) NEGATIVE Final   Influenza B by PCR NEGATIVE NEGATIVE Final    Comment: (NOTE) The Xpert Xpress SARS-CoV-2/FLU/RSV plus assay is intended as an aid in the diagnosis of influenza from Nasopharyngeal swab specimens and should not be used as a sole basis for treatment. Nasal washings and aspirates are unacceptable for Xpert Xpress SARS-CoV-2/FLU/RSV testing.  Fact Sheet for Patients: EntrepreneurPulse.com.au  Fact Sheet for Healthcare Providers: IncredibleEmployment.be  This test is not yet approved or cleared by the [place] FDA and has  been authorized for detection and/or diagnosis of SARS-CoV-2 by FDA under an Emergency Use Authorization (EUA). This EUA will remain in effect (meaning this test can be used) for the duration of the COVID-19 declaration under Section 564(b)(1) of the Act, 21 U.S.C. section 360bbb-3(b)(1), unless the authorization is terminated or revoked.  Performed at [facility], [address] [place], [place] [address]   MRSA Next Gen by PCR, Nasal     Status: None   Collection Time: 11/24/21 10:46 PM   Specimen: Nasal Mucosa; Nasal Swab  Result Value Ref Range Status   MRSA by PCR Next Gen NOT DETECTED NOT DETECTED Final    Comment: (NOTE) The GeneXpert MRSA Assay (FDA approved for NASAL specimens only), is one component of a comprehensive MRSA colonization surveillance program. It is not intended to diagnose MRSA infection nor to guide or monitor treatment for MRSA infections. Test performance is not FDA  approved in patients less than 30 years old. Performed at [facility], [place] [address], [place], [place] [address]     Radiology Studies:  DG CHEST PORT 1 VIEW  Result Date: 11/26/2021 CLINICAL DATA:  Respiratory failure with hypoxia.  Follow-up exam. EXAM: PORTABLE CHEST 1 VIEW COMPARISON:  11/24/2021 and older studies. FINDINGS: Cardiac silhouette normal in size.  No mediastinal or hilar masses. Lungs are hyperexpanded. No residual vascular congestion. Minor opacity at the left lung base consistent with atelectasis or scarring. Lungs otherwise clear. No convincing pleural effusion and no pneumothorax. Stable right internal jugular dual lumen tunneled central venous catheter. IMPRESSION: 1. No acute cardiopulmonary disease. Electronically Signed   By: Lajean Manes M.D.   On: 11/26/2021 09:52   ECHOCARDIOGRAM COMPLETE  Result Date: 11/25/2021    ECHOCARDIOGRAM REPORT   Patient Name:   MORSE BRUEGGEMANN Date of Exam: 11/25/2021 Medical Rec #:  [id number]    Height:       65.0 in Accession #:     [id number]   Weight:       170.6 lb Date of Birth:  09-May-1951    BSA:          1.849 m Patient Age:    24 years     BP:           118/68 mmHg Patient Gender: M            HR:           73 bpm. Exam Location:  Inpatient Procedure: 2D Echo, Cardiac Doppler and Color Doppler Indications:    NSTEMI  History:        Patient has no prior history of Echocardiogram examinations.                 Signs/Symptoms:Shortness of Breath.  Sonographer:    Merrie Roof RDCS Referring Phys: [place]  1. Left ventricular ejection fraction, by estimation, is 60 to 65%. The left ventricle has normal function. The left ventricle has no regional wall motion abnormalities. There is mild left ventricular hypertrophy. Left ventricular diastolic parameters are consistent with Grade II diastolic dysfunction (pseudonormalization).  2. Right ventricular systolic function is normal. The right ventricular size is normal. There is mildly elevated pulmonary artery systolic pressure. The estimated right ventricular systolic pressure is 37.1 mmHg.  3. Left atrial size was mildly dilated.  4. The mitral valve is degenerative. Mild mitral valve regurgitation. Mild calcific mitral stenosis. Mean gradient 23mmHg at HR 71; MVA 1.9cm2 by continuity. Severe mitral annular calcification.  5. The aortic valve is calcified. There is moderate-to-severe calcification of the aortic valve. There is moderate-to-severe thickening of the aortic valve. Aortic valve regurgitation is not visualized. Moderate aortic valve stenosis. AVA 0.91cm2, mean gradient 57mmHg, peak gradient 29mmHg, Vmax 3.41m/s, DI 0.3.  6. The inferior vena cava is dilated in size with >50% respiratory variability, suggesting right atrial pressure of 8 mmHg. Comparison(s): No prior Echocardiogram. FINDINGS  Left Ventricle: Left ventricular ejection fraction, by estimation, is 60 to 65%. The left ventricle has normal function. The left ventricle has no regional wall motion  abnormalities. The left ventricular internal cavity size was normal in size. There is  mild left ventricular hypertrophy. Left ventricular diastolic parameters are consistent with Grade II diastolic dysfunction (pseudonormalization). Right Ventricle: The right ventricular size is normal. No increase in right ventricular wall thickness. Right ventricular systolic function is normal. There is mildly elevated pulmonary artery systolic pressure. The  tricuspid regurgitant velocity is 3.00  m/s, and with an assumed right atrial pressure of 3 mmHg, the estimated right ventricular systolic pressure is 53.6 mmHg. Left Atrium: Left atrial size was mildly dilated. Right Atrium: Right atrial size was normal in size. Pericardium: There is no evidence of pericardial effusion. Mitral Valve: The mitral valve is degenerative in appearance. There is mild thickening of the mitral valve leaflet(s). There is moderate calcification of the mitral valve leaflet(s). Severe mitral annular calcification. Mild mitral valve regurgitation. Mild mitral valve stenosis. Tricuspid Valve: The tricuspid valve is normal in structure. Tricuspid valve regurgitation is mild. Aortic Valve: There is moderate calcification of the aortic valve. There is moderate thickening of the aortic valve. Aortic valve regurgitation is not visualized. Moderate aortic stenosis is present. AVA 0.91cm2, mean gradient 62mmHg, peak gradient 68mmHg, Vmax 3.30m/s, DI 0.3. The aortic valve is calcified.  Pulmonic Valve: The pulmonic valve was normal in structure. Pulmonic valve regurgitation is trivial. Aorta: The aortic root is normal in size and structure. Venous: The inferior vena cava is dilated in size with greater than 50% respiratory variability, suggesting right atrial pressure of 8 mmHg. IAS/Shunts: The atrial septum is grossly normal.  LEFT VENTRICLE PLAX 2D LVIDd:         4.60 cm   Diastology LVIDs:         3.10 cm   LV e' medial:    8.94 cm/s LV PW:         1.30 cm    LV E/e' medial:  17.0 LV IVS:        1.10 cm   LV e' lateral:   9.38 cm/s LVOT diam:     2.10 cm   LV E/e' lateral: 16.2 LV SV:         187 LV SV Index:   101 LVOT Area:     3.46 cm  RIGHT VENTRICLE             IVC RV S prime:     10.70 cm/s  IVC diam: 2.10 cm TAPSE (M-mode): 1.2 cm LEFT ATRIUM             Index        RIGHT ATRIUM           Index LA diam:        4.30 cm 2.33 cm/m   RA Area:     19.10 cm LA Vol (A2C):   65.4 ml 35.37 ml/m  RA Volume:   52.20 ml  28.23 ml/m LA Vol (A4C):   75.6 ml 40.89 ml/m LA Biplane Vol: 74.1 ml 40.07 ml/m  AORTIC VALVE AV Area (Vmax):    2.37 cm AV Area (Vmean):   2.40 cm AV Area (VTI):     2.48 cm AV Vmax:           328.50 cm/s AV Vmean:          236.500 cm/s AV VTI:            0.752 m AV Peak Grad:      43.2 mmHg AV Mean Grad:      25.0 mmHg LVOT Vmax:         225.00 cm/s LVOT Vmean:        164.150 cm/s LVOT VTI:          0.538 m LVOT/AV VTI ratio: 0.72  AORTA Ao Root diam: 3.10 cm Ao Asc diam:  3.60 cm MITRAL VALVE  TRICUSPID VALVE MV Area (PHT): 3.12 cm     TR Peak grad:   36.0 mmHg MV Decel Time: 243 msec     TR Vmax:        300.00 cm/s MV E velocity: 152.00 cm/s MV A velocity: 116.00 cm/s  SHUNTS MV E/A ratio:  1.31         Systemic VTI:  0.54 m                             Systemic Diam: 2.10 cm Gwyndolyn Kaufman MD Electronically signed by Gwyndolyn Kaufman MD Signature Date/Time: 11/25/2021/3:35:30 PM    Final     Scheduled Meds:    aspirin EC  81 mg Oral Daily   atorvastatin  40 mg Oral QPM   calcitRIOL  0.25 mcg Oral Q T,Th,Sa-HD   calcium carbonate  500 mg Oral QHS   Chlorhexidine Gluconate Cloth  6 each Topical Q0600   clopidogrel  75 mg Oral Daily   doxycycline  100 mg Oral Q12H   fluticasone furoate-vilanterol  1 puff Inhalation Daily   heparin injection (subcutaneous)  5,000 Units Subcutaneous Q8H   ipratropium-albuterol  3 mL Nebulization TID   oseltamivir  30 mg Oral Q T,Th,Sa-HD   pantoprazole  40 mg Oral Daily    predniSONE  40 mg Oral Q breakfast   sucroferric oxyhydroxide  1,000 mg Oral TID WC   tamsulosin  0.4 mg Oral QHS   umeclidinium bromide  1 puff Inhalation Daily    Continuous Infusions:       LOS: 3 days     Vernell Leep, MD,  FACP, [facility], [facility], [facility] (Care Management Physician Certified) [place]  To contact the attending provider between 7A-7P or the covering provider during after hours 7P-7A, please log into the web site [email or web address] and access using universal [place] password for that web site. If you do not have the password, please call the hospital operator.  11/27/2021, 9:12 AM

## 2021-11-27 NOTE — Consult Note (Signed)
NAME:  Kerry Underwood, MRN:  [id number], DOB:  1951/11/02, LOS: 3 ADMISSION DATE:  11/24/2021, CONSULTATION DATE:  12/19 REFERRING MD:  Algis Liming, CHIEF COMPLAINT:  AECOPD and slow to resolve dyspnea    History of Present Illness:  70 year old male patient with history as outlined below who initially presented to the emergency room at [facility] with chief complaint of fever, worsening shortness of breath and cough not relieved with home rescue bronchodilators.  Noted room air pulse oximetry at 60%.  Diagnostic evaluation positive for influenza A, and none ST elevation MI.  Therapeutic interventions have included from time of admission: Supplemental oxygen, systemic steroids, Tamiflu, and bronchodilators.  In regards to non-ST elevation MI he is completed heparin infusion, currently on medical therapy including statin aspirin and Plavix in addition to metoprolol. Pulmonary asked to see on 12/19 as patient still exhibiting exertional dyspnea and had not returned to baseline pulmonary function   Baseline Not on oxygen  Ambulatory, lives alone, still drives. Denies shortness of breath ever being limiting factor to ADLs  Still smokes ~ 10 home rolled cigarettes a day (>60 year smoker) Has been out of his [employer] and his spiriva for some time.   Pertinent  Medical History  Chronic diastolic HF, ongoing tobacco abuse, COPD, hypertension, end-stage renal disease, on hemodialysis TTS.  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  Peripheral arterial disease, prior stroke. Chronic viral hepatitis C Significant Hospital Events: Including procedures, antibiotic start and stop dates in addition to other pertinent events   12/16 admitted with influenza A and acute COPD exacerbation complicated further by non-ST elevation MI.  Started on Tamiflu, systemic steroids, supplemental oxygen, bronchodilators, IV heparin. 12/17 seen by cardiology in consultation felt troponin elevation secondary to demand  ischemia/type II non-ST elevation MI .  Seen by nephrology felt volume overloaded adjusted UF accordingly, 3.5 L removed with dialysis.  Echocardiogram: EF 60 to 65% LV function normal no regional wall motion abnormalities RV function normal 12/18: Heparin discontinued, doxycycline added for possible exacerbation of COPD 12/19: Feeling better but still had exertional dyspnea and shortness of breath when talking. Interim History / Subjective:  Feels better  Objective   Blood pressure (Abnormal) 145/59, pulse 92, temperature 97.8 F (36.6 C), temperature source Oral, resp. rate 19, height 5\' 5"  (1.651 m), weight 75.9 kg, SpO2 93 %.        Intake/Output Summary (Last 24 hours) at 11/27/2021 0959 Last data filed at 11/27/2021 0814 Gross per 24 hour  Intake 590 ml  Output no documentation  Net 590 ml   Filed Weights   11/25/21 1830 11/26/21 0521 11/27/21 0601  Weight: 76.8 kg 77.8 kg 75.9 kg    Examination: General: 70 year old male resting in bed. No distress but still has some exertional dyspnea  HENT: faint UAW wheeze Lungs: prolonged exp wheeze. Some accessory w/ exertion. Room air sats 90s  Portable chest x-ray personally reviewed:This was from 12/18 HD catheter in place.  Clear chest x-ray no infiltrates Cardiovascular: RRR Abdomen: soft not tender  Extremities: warm and dry  Neuro: awake and oriented   Resolved Hospital Problem list     Assessment & Plan:  Acute Hypoxic respiratory failure AECOPD  Influenza A Tobacco abuse Type II NSTEMI ESRD (TTS) Hep C  Anemia of CKD  Current pulm problem list  AECOPD 2/2 influenza A On-going tobacco abuse  Discussion He seems to be slowly improving. He still actively smokes about 10 cigarettes a day (rolls his own &  has been smoking for > 60 yrs). He estimates his breathing has improved about 50-60% since he was admitted. Still has night-time cough and wheezes but feels better after he gets his BD. Overall I think he's headed  the right direction  Plan Complete 5d of pred total  Send him home with Incruse or spiriva and Breo ellipta w/ rescue albuterol Cont smoking cessation  Needs walking oximetry prior to dc Cont PPI at dc Will get him set up with one of doctors in [employer] (right click and "Reselect all SmartList Selections" daily)   Per primary   Labs   CBC: Recent Labs  Lab 11/24/21 1620 11/25/21 0430 11/26/21 0026 11/27/21 0051  WBC 6.8 3.5* 5.1 5.2  NEUTROABS 5.9  --   --   --   HGB 14.6 12.2* 11.8* 10.0*  HCT 48.5 39.6 37.4* 32.1*  MCV 96.0 91.9 89.9 89.9  PLT 154 144* 172 149*    Basic Metabolic Panel: Recent Labs  Lab 11/24/21 1620 11/25/21 0559 11/26/21 0026  NA 133* 133*  --   K 5.3* 5.0  --   CL 92* 96*  --   CO2 26 20*  --   GLUCOSE 124* 129*  --   BUN 41* 58*  --   CREATININE 6.65* 7.55*  --   CALCIUM 7.6* 7.3*  --   PHOS  --   --  5.3*   GFR: Estimated Creatinine Clearance: 8.4 mL/min (A) (by C-G formula based on SCr of 7.55 mg/dL (H)). Recent Labs  Lab 11/24/21 1620 11/25/21 0430 11/26/21 0026 11/27/21 0051  WBC 6.8 3.5* 5.1 5.2    Liver Function Tests: No results for input(s): AST, ALT, ALKPHOS, BILITOT, PROT, ALBUMIN in the last 168 hours. No results for input(s): LIPASE, AMYLASE in the last 168 hours. No results for input(s): AMMONIA in the last 168 hours.  ABG    Component Value Date/Time   PHART 7.224 (L) 09/22/2021 1044   PCO2ART 63.6 (H) 09/22/2021 1044   PO2ART 284 (H) 09/22/2021 1044   HCO3 26.2 09/22/2021 1044   TCO2 28 09/22/2021 1044   ACIDBASEDEF 2.0 09/22/2021 1044   O2SAT 100.0 09/22/2021 1044     Coagulation Profile: No results for input(s): INR, PROTIME in the last 168 hours.  Cardiac Enzymes: No results for input(s): CKTOTAL, CKMB, CKMBINDEX, TROPONINI in the last 168 hours.  HbA1C: Hgb A1c MFr Bld  Date/Time Value Ref Range Status  09/15/2021 03:04 PM 5.2 4.8 - 5.6 % Final    Comment:    (NOTE) Pre  diabetes:          5.7%-6.4%  Diabetes:              >6.4%  Glycemic control for   <7.0% adults with diabetes     CBG: No results for input(s): GLUCAP in the last 168 hours.  Review of Systems:   Review of Systems  Constitutional:  Negative for chills and fever.  HENT:  Negative for congestion, sinus pain and sore throat.   Eyes: Negative.   Respiratory:  Positive for cough, shortness of breath and wheezing. Negative for sputum production.   Cardiovascular: Negative.   Gastrointestinal: Negative.   Genitourinary: Negative.   Musculoskeletal: Negative.   Skin: Negative.   Neurological: Negative.   Endo/Heme/Allergies: Negative.   Psychiatric/Behavioral: Negative.      Past Medical History:  He,  has a past medical history of Anemia, CHF (congestive heart failure) ([place]), COPD (chronic  obstructive pulmonary disease) ([place]), Full dentures, Hypertension, Pneumonia, Pyelonephritis (09/11/2017), Renal disorder, and Stroke ([place]).   Surgical History:   Past Surgical History:  Procedure Laterality Date   ABDOMINAL AORTOGRAM W/LOWER EXTREMITY Bilateral 09/20/2021   Procedure: ABDOMINAL AORTOGRAM W/LOWER EXTREMITY;  Surgeon: Broadus John, MD;  Location: [facility];  Service: Cardiovascular;  Laterality: Bilateral;   AMPUTATION TOE Right 09/22/2021   Procedure: PARTIAL AMPUTATION SECOND RIGHT TOE;  Surgeon: Marty Heck, MD;  Location: [facility];  Service: Vascular;  Laterality: Right;   [place] Right 04/19/2020   Procedure: Basilic Vein Transposition Right Arm;  Surgeon: Waynetta Sandy, MD;  Location: [facility];  Service: Vascular;  Laterality: Right;   COLON SURGERY     ENDARTERECTOMY FEMORAL Right 09/22/2021   Procedure: ENDARTERECTOMY FEMORAL WITH PATCH ANGIOPLASTY USING 1X6CM Weyman Pedro;  Surgeon: Marty Heck, MD;  Location: [facility];  Service: Vascular;  Laterality: Right;   FEMORAL-TIBIAL BYPASS GRAFT Right 09/22/2021    Procedure: RIGHT FEMORAL TO BELOW KNEE POPLITEAL BYPASS WITH HARVEST OF GREATER SAPHENOUS VEIN;  Surgeon: Marty Heck, MD;  Location: [facility];  Service: Vascular;  Laterality: Right;   INSERTION OF DIALYSIS CATHETER Right 04/19/2020   Procedure: Insertion Of Right Internal Jugular Dialysis Catheter;  Surgeon: Waynetta Sandy, MD;  Location: [facility];  Service: Vascular;  Laterality: Right;   MULTIPLE TOOTH EXTRACTIONS     PERIPHERAL VASCULAR BALLOON ANGIOPLASTY Right 09/20/2021   Procedure: PERIPHERAL VASCULAR BALLOON ANGIOPLASTY;  Surgeon: Broadus John, MD;  Location: [facility];  Service: Cardiovascular;  Laterality: Right;  SFA  FAILED     Social History:   reports that he has been smoking cigarettes. He has a 31.00 pack-year smoking history. He has quit using smokeless tobacco.  His smokeless tobacco use included chew. He reports that he does not currently use drugs after having used the following drugs: Marijuana. He reports that he does not drink alcohol.   Family History:  His family history includes Alzheimer's disease in his mother.   Allergies Allergies  Allergen Reactions   Strawberry (Diagnostic)    Strawberry Extract     Other reaction(s): Unknown     Home Medications  Prior to Admission medications   Medication Sig Start Date End Date Taking? Authorizing Provider  albuterol (VENTOLIN HFA) 108 (90 Base) MCG/ACT inhaler Inhale 2 puffs into the lungs every 6 (six) hours as needed for wheezing or shortness of breath. 07/02/21 11/24/21 Yes Barton Dubois, MD  amLODipine (NORVASC) 5 MG tablet Take 5 mg by mouth daily. 09/25/21  Yes [provider]  aspirin EC 325 MG tablet Take 325 mg by mouth at bedtime.   Yes [provider]  atorvastatin (LIPITOR) 40 MG tablet Take 1 tablet (40 mg total) by mouth daily. 09/26/21  Yes Ghimire, Henreitta Leber, MD  Cholecalciferol (VITAMIN D3 PO) Take by mouth daily.   Yes [provider]   clopidogrel (PLAVIX) 75 MG tablet Take 1 tablet (75 mg total) by mouth daily with breakfast. 09/26/21  Yes Ghimire, Henreitta Leber, MD  fluticasone furoate-vilanterol (BREO ELLIPTA) 200-25 MCG/INH AEPB Inhale 1 puff into the lungs daily. 07/02/21  Yes Barton Dubois, MD  heparin 1000 unit/mL SOLN injection Heparin Sodium (Porcine) 1,000 Units/mL Catheter Lock Arterial 10/05/21 10/04/22 Yes [provider]  Methoxy PEG-Epoetin Beta (MIRCERA IJ) Mircera 09/28/21 09/27/22 Yes [provider]  metoprolol tartrate (LOPRESSOR) 25 MG tablet Take 1 tablet (25 mg total) by mouth 2 (  two) times daily. 09/25/21 11/24/21 Yes Ghimire, Henreitta Leber, MD  sucroferric oxyhydroxide (VELPHORO) 500 MG chewable tablet Chew 1,000 mg by mouth 3 (three) times daily with meals.   Yes [provider]  tamsulosin (FLOMAX) 0.4 MG CAPS capsule Take 0.4 mg by mouth at bedtime. 05/24/20  Yes [provider]  tiotropium (SPIRIVA HANDIHALER) 18 MCG inhalation capsule Place 1 capsule (18 mcg total) into inhaler and inhale daily. 07/02/21 11/24/21 Yes Barton Dubois, MD  B Complex-C-Folic Acid (DIALYVITE 025 PO) Take 0.8 mg by mouth at bedtime. Patient not taking: Reported on 11/24/2021    [provider]  calcitRIOL (ROCALTROL) 0.25 MCG capsule Take 1 capsule (0.25 mcg total) by mouth Every Tuesday,Thursday,and Saturday with dialysis. Patient not taking: Reported on 11/24/2021 09/26/21   Jonetta Osgood, MD  Calcium Carbonate 500 MG CHEW Chew 500 mg by mouth at bedtime. Patient not taking: Reported on 11/24/2021    [provider]  oxyCODONE-acetaminophen (PERCOCET) 5-325 MG tablet Take 1 tablet by mouth every 6 (six) hours as needed. Patient not taking: Reported on 11/24/2021 10/16/21   Gabriel Earing, PA-C  pantoprazole (PROTONIX) 40 MG tablet Take 1 tablet (40 mg total) by mouth daily. Patient not taking: Reported on 11/24/2021 09/26/21   Jonetta Osgood, MD     Critical care  time: NA      Erick Colace ACNP-BC [place] Pager # [phone] OR # [phone] if no answer

## 2021-11-27 NOTE — Progress Notes (Signed)
Mobility Specialist Progress Note    11/27/21 1407  Mobility  Activity Ambulated in hall  Level of Assistance Standby assist, set-up cues, supervision of patient - no hands on  Assistive Device None  Distance Ambulated (ft) 410 ft  Mobility Ambulated independently in hallway  Mobility Response Tolerated fair  Mobility performed by Mobility specialist  $Mobility charge 1 Mobility   During Mobility: 97 HR, 96% SpO2 Post-Mobility: 88 HR, 96% SpO2  Pt received in bed and agreeable. Took x2 standing rest breaks to recover. Pt noticeably SOB throughout walk when trying to maintain conversation. Returned to bed with call bell in reach.   [facility] Mobility Specialist  M.S. Primary Phone: [phone] M.S. Secondary Phone: [phone]

## 2021-11-27 NOTE — Progress Notes (Signed)
Patient ID: Kerry Underwood, male   DOB: 02-Oct-1951, 70 y.o.   MRN: [id number] S: Feeling better and wants to go home. O:BP 117/70 (BP Location: Left Arm)    Pulse 81    Temp 97.9 F (36.6 C) (Oral)    Resp 18    Ht 5\' 5"  (1.651 m)    Wt 75.9 kg    SpO2 99%    BMI 27.85 kg/m   Intake/Output Summary (Last 24 hours) at 11/27/2021 0811 Last data filed at 11/26/2021 1200 Gross per 24 hour  Intake 240 ml  Output --  Net 240 ml   Intake/Output: I/O last 3 completed shifts: In: 600 [P.O.:600] Out: -   Intake/Output this shift:  No intake/output data recorded. Weight change: -4.5 kg Gen:NAD CVS: RRR Resp: occ rhonchi Abd: +BS, soft, NT/ND Ext: no edema, s/p right second toe amputation, RUE AVF +T/B  Recent Labs  Lab 11/24/21 1620 11/25/21 0559 11/26/21 0026  NA 133* 133*  --   K 5.3* 5.0  --   CL 92* 96*  --   CO2 26 20*  --   GLUCOSE 124* 129*  --   BUN 41* 58*  --   CREATININE 6.65* 7.55*  --   CALCIUM 7.6* 7.3*  --   PHOS  --   --  5.3*   Liver Function Tests: No results for input(s): AST, ALT, ALKPHOS, BILITOT, PROT, ALBUMIN in the last 168 hours. No results for input(s): LIPASE, AMYLASE in the last 168 hours. No results for input(s): AMMONIA in the last 168 hours. CBC: Recent Labs  Lab 11/24/21 1620 11/25/21 0430 11/26/21 0026 11/27/21 0051  WBC 6.8 3.5* 5.1 5.2  NEUTROABS 5.9  --   --   --   HGB 14.6 12.2* 11.8* 10.0*  HCT 48.5 39.6 37.4* 32.1*  MCV 96.0 91.9 89.9 89.9  PLT 154 144* 172 149*   Cardiac Enzymes: No results for input(s): CKTOTAL, CKMB, CKMBINDEX, TROPONINI in the last 168 hours. CBG: No results for input(s): GLUCAP in the last 168 hours.  Iron Studies: No results for input(s): IRON, TIBC, TRANSFERRIN, FERRITIN in the last 72 hours. Studies/Results: DG CHEST PORT 1 VIEW  Result Date: 11/26/2021 CLINICAL DATA:  Respiratory failure with hypoxia.  Follow-up exam. EXAM: PORTABLE CHEST 1 VIEW COMPARISON:  11/24/2021 and older studies. FINDINGS:  Cardiac silhouette normal in size.  No mediastinal or hilar masses. Lungs are hyperexpanded. No residual vascular congestion. Minor opacity at the left lung base consistent with atelectasis or scarring. Lungs otherwise clear. No convincing pleural effusion and no pneumothorax. Stable right internal jugular dual lumen tunneled central venous catheter. IMPRESSION: 1. No acute cardiopulmonary disease. Electronically Signed   By: Lajean Manes M.D.   On: 11/26/2021 09:52   ECHOCARDIOGRAM COMPLETE  Result Date: 11/25/2021    ECHOCARDIOGRAM REPORT   Patient Name:   DREYTON ROESSNER Date of Exam: 11/25/2021 Medical Rec #:  [id number]    Height:       65.0 in Accession #:    [id number]   Weight:       170.6 lb Date of Birth:  03/07/1951    BSA:          1.849 m Patient Age:    43 years     BP:           118/68 mmHg Patient Gender: M            HR:           73  bpm. Exam Location:  Inpatient Procedure: 2D Echo, Cardiac Doppler and Color Doppler Indications:    NSTEMI  History:        Patient has no prior history of Echocardiogram examinations.                 Signs/Symptoms:Shortness of Breath.  Sonographer:    Merrie Roof RDCS Referring Phys: [place]  1. Left ventricular ejection fraction, by estimation, is 60 to 65%. The left ventricle has normal function. The left ventricle has no regional wall motion abnormalities. There is mild left ventricular hypertrophy. Left ventricular diastolic parameters are consistent with Grade II diastolic dysfunction (pseudonormalization).  2. Right ventricular systolic function is normal. The right ventricular size is normal. There is mildly elevated pulmonary artery systolic pressure. The estimated right ventricular systolic pressure is 87.8 mmHg.  3. Left atrial size was mildly dilated.  4. The mitral valve is degenerative. Mild mitral valve regurgitation. Mild calcific mitral stenosis. Mean gradient 73mmHg at HR 71; MVA 1.9cm2 by continuity. Severe mitral annular  calcification.  5. The aortic valve is calcified. There is moderate-to-severe calcification of the aortic valve. There is moderate-to-severe thickening of the aortic valve. Aortic valve regurgitation is not visualized. Moderate aortic valve stenosis. AVA 0.91cm2, mean gradient 53mmHg, peak gradient 22mmHg, Vmax 3.16m/s, DI 0.3.  6. The inferior vena cava is dilated in size with >50% respiratory variability, suggesting right atrial pressure of 8 mmHg. Comparison(s): No prior Echocardiogram. FINDINGS  Left Ventricle: Left ventricular ejection fraction, by estimation, is 60 to 65%. The left ventricle has normal function. The left ventricle has no regional wall motion abnormalities. The left ventricular internal cavity size was normal in size. There is  mild left ventricular hypertrophy. Left ventricular diastolic parameters are consistent with Grade II diastolic dysfunction (pseudonormalization). Right Ventricle: The right ventricular size is normal. No increase in right ventricular wall thickness. Right ventricular systolic function is normal. There is mildly elevated pulmonary artery systolic pressure. The tricuspid regurgitant velocity is 3.00  m/s, and with an assumed right atrial pressure of 3 mmHg, the estimated right ventricular systolic pressure is 67.6 mmHg. Left Atrium: Left atrial size was mildly dilated. Right Atrium: Right atrial size was normal in size. Pericardium: There is no evidence of pericardial effusion. Mitral Valve: The mitral valve is degenerative in appearance. There is mild thickening of the mitral valve leaflet(s). There is moderate calcification of the mitral valve leaflet(s). Severe mitral annular calcification. Mild mitral valve regurgitation. Mild mitral valve stenosis. Tricuspid Valve: The tricuspid valve is normal in structure. Tricuspid valve regurgitation is mild. Aortic Valve: There is moderate calcification of the aortic valve. There is moderate thickening of the aortic valve. Aortic  valve regurgitation is not visualized. Moderate aortic stenosis is present. AVA 0.91cm2, mean gradient 85mmHg, peak gradient 61mmHg, Vmax 3.23m/s, DI 0.3. The aortic valve is calcified.  Pulmonic Valve: The pulmonic valve was normal in structure. Pulmonic valve regurgitation is trivial. Aorta: The aortic root is normal in size and structure. Venous: The inferior vena cava is dilated in size with greater than 50% respiratory variability, suggesting right atrial pressure of 8 mmHg. IAS/Shunts: The atrial septum is grossly normal.  LEFT VENTRICLE PLAX 2D LVIDd:         4.60 cm   Diastology LVIDs:         3.10 cm   LV e' medial:    8.94 cm/s LV PW:         1.30 cm  LV E/e' medial:  17.0 LV IVS:        1.10 cm   LV e' lateral:   9.38 cm/s LVOT diam:     2.10 cm   LV E/e' lateral: 16.2 LV SV:         187 LV SV Index:   101 LVOT Area:     3.46 cm  RIGHT VENTRICLE             IVC RV S prime:     10.70 cm/s  IVC diam: 2.10 cm TAPSE (M-mode): 1.2 cm LEFT ATRIUM             Index        RIGHT ATRIUM           Index LA diam:        4.30 cm 2.33 cm/m   RA Area:     19.10 cm LA Vol (A2C):   65.4 ml 35.37 ml/m  RA Volume:   52.20 ml  28.23 ml/m LA Vol (A4C):   75.6 ml 40.89 ml/m LA Biplane Vol: 74.1 ml 40.07 ml/m  AORTIC VALVE AV Area (Vmax):    2.37 cm AV Area (Vmean):   2.40 cm AV Area (VTI):     2.48 cm AV Vmax:           328.50 cm/s AV Vmean:          236.500 cm/s AV VTI:            0.752 m AV Peak Grad:      43.2 mmHg AV Mean Grad:      25.0 mmHg LVOT Vmax:         225.00 cm/s LVOT Vmean:        164.150 cm/s LVOT VTI:          0.538 m LVOT/AV VTI ratio: 0.72  AORTA Ao Root diam: 3.10 cm Ao Asc diam:  3.60 cm MITRAL VALVE                TRICUSPID VALVE MV Area (PHT): 3.12 cm     TR Peak grad:   36.0 mmHg MV Decel Time: 243 msec     TR Vmax:        300.00 cm/s MV E velocity: 152.00 cm/s MV A velocity: 116.00 cm/s  SHUNTS MV E/A ratio:  1.31         Systemic VTI:  0.54 m                             Systemic Diam: 2.10  cm Gwyndolyn Kaufman MD Electronically signed by Gwyndolyn Kaufman MD Signature Date/Time: 11/25/2021/3:35:30 PM    Final     aspirin EC  81 mg Oral Daily   atorvastatin  40 mg Oral QPM   calcitRIOL  0.25 mcg Oral Q T,Th,Sa-HD   calcium carbonate  500 mg Oral QHS   Chlorhexidine Gluconate Cloth  6 each Topical Q0600   clopidogrel  75 mg Oral Daily   doxycycline  100 mg Oral Q12H   fluticasone furoate-vilanterol  1 puff Inhalation Daily   heparin injection (subcutaneous)  5,000 Units Subcutaneous Q8H   ipratropium-albuterol  3 mL Nebulization TID   oseltamivir  30 mg Oral Q T,Th,Sa-HD   pantoprazole  40 mg Oral Daily   predniSONE  40 mg Oral Q breakfast   sucroferric oxyhydroxide  1,000 mg Oral TID WC   tamsulosin  0.4 mg  Oral QHS   umeclidinium bromide  1 puff Inhalation Daily    BMET    Component Value Date/Time   NA 133 (L) 11/25/2021 0559   NA 140 08/23/2021 1208   K 5.0 11/25/2021 0559   CL 96 (L) 11/25/2021 0559   CO2 20 (L) 11/25/2021 0559   GLUCOSE 129 (H) 11/25/2021 0559   BUN 58 (H) 11/25/2021 0559   BUN 39 (H) 08/23/2021 1208   CREATININE 7.55 (H) 11/25/2021 0559   CALCIUM 7.3 (L) 11/25/2021 0559   GFRNONAA 7 (L) 11/25/2021 0559   GFRAA 6 (L) 04/20/2020 0545   CBC    Component Value Date/Time   WBC 5.2 11/27/2021 0051   RBC 3.57 (L) 11/27/2021 0051   HGB 10.0 (L) 11/27/2021 0051   HCT 32.1 (L) 11/27/2021 0051   HCT 26.1 (L) 04/16/2020 1052   PLT 149 (L) 11/27/2021 0051   MCV 89.9 11/27/2021 0051   MCH 28.0 11/27/2021 0051   MCHC 31.2 11/27/2021 0051   RDW 16.4 (H) 11/27/2021 0051   LYMPHSABS 0.5 (L) 11/24/2021 1620   MONOABS 0.4 11/24/2021 1620   EOSABS 0.0 11/24/2021 1620   BASOSABS 0.0 11/24/2021 1620     OP HD: TTS [facility]     4h 3min  80.7kg   350/500  P4   RIJ TDC  Heparin  5000  - no esa  - calcitriol 0.25 ug tiw po     Assessment/ Plan: NSTEMI - trop 540 range, per pmd / cardiology felt to be demand ischemia, ECHO without change.  Off IV  heparin and will have ongoing outpatient workup. +Flu A - per pmd, s/p tamiflu, steroids (now po), and nebulizers.  Started doxycycline for possible concomitant bacterial infection. SOB - initial CXR + for vasc congestion. Got 3.5 L off w/ HD yesterday but w/ cramping mid HD ongoing this am. Came off 3kg under at 77kg. F/U CXR shows resolved vasc congestion. Doubt current SOB due to vol overload, suspect upper airway/ COPD/ flu primary issues.  Will lower outpatient EDW to 77.5kg (cramped with 77kg).  ESRD - on HD TTS.  Using [facility]. HD as outpatient tomorrow if discharged, otherwise will plan for HD here. HD access - on HD 1 year, using TDC placed May 2021. Also had 1st stage BVT May 2021 but don't see that 2nd stage has been done. Have referred multiple times for second stage, however pt noncompliant with outpatient follow up. If he will be staying in hospital will consult VVS.  HTN  -BP's down w/ volume removal, have dc'd norvasc and BB for now Anemia ckd - Hb > 10, no esa needs MBD ckd - Ca ok, check phos, cont vdra, binders PAD - had RLE bypass surgery in October COPD  H/o CVA Disposition - hopeful discharge to home today per primary.  [name], MD [employer] [phone]

## 2021-11-27 NOTE — Progress Notes (Signed)
Assessment & Plan:  1. Acute respiratory distress Patient transferred to [facility] ER via EMS due respiratory distress. Oxygenation 56% on room air when patient arrived to the exam room. He did come up to 94% on oxygen 4L via nasal cannula.    Follow up plan: Return after hospitalization.  [name], MSN, APRN, FNP-C [facility]  Subjective:   Patient ID: Kerry Underwood, male    DOB: 19-Dec-1950, 70 y.o.   MRN: [id number]  HPI: Kerry Underwood is a 70 y.o. male presenting on 11/24/2021 for Shortness of Breath and Fever (X 1 day)  Patient presents with complaints of shortness of breath and fever that started yesterday. A Automotive engineer called this morning to report patient was wheezing and short of breath on the phone. She had encouraged him to call 911, but he would not. Patient reports today that he was tested for COVID-19 yesterday at dialysis, but the results will not be back until tomorrow. Denies any upper respiratory symptoms. Does report a mild cough and headache. Denies confusion. Patient drove himself here today.    ROS: Negative unless specifically indicated above in HPI.   Relevant past medical history reviewed and updated as indicated.   Allergies and medications reviewed and updated.  No current facility-administered medications for this visit. No current outpatient medications on file.  Facility-Administered Medications Ordered in Other Visits:    acetaminophen (TYLENOL) tablet 650 mg, 650 mg, Oral, Q6H PRN, Modena Jansky, MD, 650 mg at 11/25/21 2309   albuterol (PROVENTIL) (2.5 MG/3ML) 0.083% nebulizer solution 2.5 mg, 2.5 mg, Nebulization, Q2H PRN, Truett Mainland, DO, 2.5 mg at 11/26/21 0844   aspirin EC tablet 81 mg, 81 mg, Oral, Daily, Freada Bergeron, MD, 81 mg at 11/27/21 9622   atorvastatin (LIPITOR) tablet 40 mg, 40 mg, Oral, QPM, Stinson, Jacob J, DO   calcitRIOL (ROCALTROL) capsule 0.25 mcg, 0.25 mcg,  Oral, Q T,Th,Sa-HD, Stinson, Jacob J, DO, 0.25 mcg at 11/25/21 1534   calcium carbonate (TUMS - dosed in mg elemental calcium) chewable tablet 500 mg, 500 mg, Oral, QHS, Stinson, Jacob J, DO, 500 mg at 11/26/21 2150   Chlorhexidine Gluconate Cloth 2 % PADS 6 each, 6 each, Topical, Q0600, Roney Jaffe, MD, 6 each at 11/27/21 [phone]   clopidogrel (PLAVIX) tablet 75 mg, 75 mg, Oral, Daily, Truett Mainland, DO, 75 mg at 11/27/21 8921   doxycycline (VIBRA-TABS) tablet 100 mg, 100 mg, Oral, Q12H, Hongalgi, Anand D, MD   fluticasone furoate-vilanterol (BREO ELLIPTA) 200-25 MCG/ACT 1 puff, 1 puff, Inhalation, Daily, Truett Mainland, DO, 1 puff at 11/27/21 0738   guaiFENesin-dextromethorphan (ROBITUSSIN DM) 100-10 MG/5ML syrup 5 mL, 5 mL, Oral, Q4H PRN, Etta Quill, DO, 5 mL at 11/27/21 0652   heparin injection 5,000 Units, 5,000 Units, Subcutaneous, Q8H, Einar Grad, RPH, 5,000 Units at 11/27/21 1454   ipratropium-albuterol (DUONEB) 0.5-2.5 (3) MG/3ML nebulizer solution 3 mL, 3 mL, Nebulization, TID, Hongalgi, Anand D, MD, 3 mL at 11/27/21 1515   ondansetron (ZOFRAN) tablet 4 mg, 4 mg, Oral, Q6H PRN **OR** ondansetron (ZOFRAN) injection 4 mg, 4 mg, Intravenous, Q6H PRN, Stinson, Jacob J, DO   oseltamivir (TAMIFLU) capsule 30 mg, 30 mg, Oral, Q T,Th,Sa-HD, Hongalgi, Anand D, MD, 30 mg at 11/25/21 1145   oxyCODONE-acetaminophen (PERCOCET/ROXICET) 5-325 MG per tablet 1 tablet, 1 tablet, Oral, Q6H PRN, Truett Mainland, DO   pantoprazole (PROTONIX) EC tablet 40 mg, 40 mg, Oral, Daily, Stinson,  Tanna Savoy, DO, 40 mg at 11/27/21 7782   [COMPLETED] methylPREDNISolone sodium succinate (SOLU-MEDROL) 125 mg/2 mL injection 80 mg, 80 mg, Intravenous, Daily, 80 mg at 11/25/21 0931 **FOLLOWED BY** predniSONE (DELTASONE) tablet 40 mg, 40 mg, Oral, Q breakfast, Truett Mainland, DO, 40 mg at 11/27/21 4235   sucroferric oxyhydroxide (VELPHORO) chewable tablet 1,000 mg, 1,000 mg, Oral, TID WC, Stinson, Jacob J, DO,  1,000 mg at 11/26/21 3614   tamsulosin (FLOMAX) capsule 0.4 mg, 0.4 mg, Oral, QHS, Stinson, Jacob J, DO, 0.4 mg at 11/26/21 2151   umeclidinium bromide (INCRUSE ELLIPTA) 62.5 MCG/ACT 1 puff, 1 puff, Inhalation, Daily, Truett Mainland, DO, 1 puff at 11/27/21 4315  Allergies  Allergen Reactions   Strawberry (Diagnostic)    Strawberry Extract     Other reaction(s): Unknown    Objective:   BP (!) 192/107    Pulse 64    Temp 97.6 F (36.4 C) (Temporal)    SpO2 92% Comment: 4l   Physical Exam Vitals reviewed.  Constitutional:      General: He is in acute distress.     Appearance: Normal appearance. He is ill-appearing. He is not toxic-appearing or diaphoretic.  HENT:     Head: Normocephalic and atraumatic.  Eyes:     General: No scleral icterus.       Right eye: No discharge.        Left eye: No discharge.     Conjunctiva/sclera: Conjunctivae normal.  Cardiovascular:     Rate and Rhythm: Normal rate and regular rhythm.     Heart sounds: Normal heart sounds. No murmur heard.   No friction rub. No gallop.  Pulmonary:     Effort: Tachypnea and respiratory distress present.     Breath sounds: Normal breath sounds. No stridor. No wheezing, rhonchi or rales.  Musculoskeletal:        General: Normal range of motion.     Cervical back: Normal range of motion.  Skin:    General: Skin is warm and dry.  Neurological:     Mental Status: He is alert and oriented to person, place, and time. Mental status is at baseline.  Psychiatric:        Mood and Affect: Mood normal.        Behavior: Behavior normal.        Thought Content: Thought content normal.        Judgment: Judgment normal.

## 2021-11-28 DIAGNOSIS — Z992 Dependence on renal dialysis: Secondary | ICD-10-CM

## 2021-11-28 DIAGNOSIS — I248 Other forms of acute ischemic heart disease: Secondary | ICD-10-CM

## 2021-11-28 DIAGNOSIS — N186 End stage renal disease: Secondary | ICD-10-CM

## 2021-11-28 LAB — RENAL FUNCTION PANEL
Albumin: 2.6 g/dL — ABNORMAL LOW (ref 3.5–5.0)
Anion gap: 12 (ref 5–15)
BUN: 129 mg/dL — ABNORMAL HIGH (ref 8–23)
CO2: 21 mmol/L — ABNORMAL LOW (ref 22–32)
Calcium: 7.6 mg/dL — ABNORMAL LOW (ref 8.9–10.3)
Chloride: 97 mmol/L — ABNORMAL LOW (ref 98–111)
Creatinine, Ser: 7.87 mg/dL — ABNORMAL HIGH (ref 0.61–1.24)
GFR, Estimated: 7 mL/min — ABNORMAL LOW (ref >60)
Glucose, Bld: 160 mg/dL — ABNORMAL HIGH (ref 70–99)
Phosphorus: 2.6 mg/dL (ref 2.5–4.6)
Potassium: 3.9 mmol/L (ref 3.5–5.1)
Sodium: 130 mmol/L — ABNORMAL LOW (ref 135–145)

## 2021-11-28 LAB — CBC
HCT: 29 % — ABNORMAL LOW (ref 39.0–52.0)
Hemoglobin: 9.2 g/dL — ABNORMAL LOW (ref 13.0–17.0)
MCH: 28.3 pg (ref 26.0–34.0)
MCHC: 31.7 g/dL (ref 30.0–36.0)
MCV: 89.2 fL (ref 80.0–100.0)
Platelets: 140 10*3/uL — ABNORMAL LOW (ref 150–400)
RBC: 3.25 MIL/uL — ABNORMAL LOW (ref 4.22–5.81)
RDW: 16.3 % — ABNORMAL HIGH (ref 11.5–15.5)
WBC: 4.9 10*3/uL (ref 4.0–10.5)
nRBC: 0 % (ref 0.0–0.2)

## 2021-11-28 MED ORDER — ALBUTEROL SULFATE HFA 108 (90 BASE) MCG/ACT IN AERS
2.0000 | INHALATION_SPRAY | RESPIRATORY_TRACT | 0 refills | Status: DC | PRN
Start: 2021-11-28 — End: 2022-10-14

## 2021-11-28 MED ORDER — SPIRIVA HANDIHALER 18 MCG IN CAPS: 18.0000 ug | ORAL_CAPSULE | Freq: Every day | RESPIRATORY_TRACT | 1 refills | Status: DC

## 2021-11-28 MED ORDER — CLOPIDOGREL BISULFATE 75 MG PO TABS: 75.0000 mg | ORAL_TABLET | Freq: Every day | ORAL | 1 refills | Status: DC

## 2021-11-28 MED ORDER — ASPIRIN EC 81 MG PO TBEC: 81.0000 mg | DELAYED_RELEASE_TABLET | Freq: Every day | ORAL | 1 refills | Status: DC

## 2021-11-28 MED ORDER — FLUTICASONE FUROATE-VILANTEROL 200-25 MCG/INH IN AEPB: 1.0000 | INHALATION_SPRAY | Freq: Every day | RESPIRATORY_TRACT | 1 refills | Status: DC

## 2021-11-28 MED ORDER — HEPARIN SODIUM (PORCINE) 1000 UNIT/ML DIALYSIS
1500.0000 [IU] | Freq: Once | INTRAMUSCULAR | Status: AC
  Administered 2021-11-28: 1500 [IU] via INTRAVENOUS_CENTRAL
  Filled 2021-11-28: qty 2

## 2021-11-28 MED ORDER — ALTEPLASE 2 MG IJ SOLR
INTRAMUSCULAR | Status: AC
  Administered 2021-11-28: 2 mg via INTRAVENOUS
  Filled 2021-11-28: qty 4

## 2021-11-28 MED ORDER — PREDNISONE 10 MG PO TABS: ORAL_TABLET | ORAL | 0 refills | Status: DC

## 2021-11-28 NOTE — Plan of Care (Signed)
Problem: Education: Goal: Knowledge of General Education information will improve Description: Including pain rating scale, medication(s)/side effects and non-pharmacologic comfort measures 11/28/2021 1427 by Leonie Man, RN Outcome: Adequate for Discharge 11/28/2021 1427 by Leonie Man, RN Outcome: Adequate for Discharge   Problem: Health Behavior/Discharge Planning: Goal: Ability to manage health-related needs will improve 11/28/2021 1427 by Leonie Man, RN Outcome: Adequate for Discharge 11/28/2021 1427 by Leonie Man, RN Outcome: Adequate for Discharge   Problem: Clinical Measurements: Goal: Ability to maintain clinical measurements within normal limits will improve 11/28/2021 1427 by Leonie Man, RN Outcome: Adequate for Discharge 11/28/2021 1427 by Leonie Man, RN Outcome: Adequate for Discharge Goal: Will remain free from infection 11/28/2021 1427 by Leonie Man, RN Outcome: Adequate for Discharge 11/28/2021 1427 by Leonie Man, RN Outcome: Adequate for Discharge Goal: Diagnostic test results will improve 11/28/2021 1427 by Leonie Man, RN Outcome: Adequate for Discharge 11/28/2021 1427 by Leonie Man, RN Outcome: Adequate for Discharge Goal: Respiratory complications will improve 11/28/2021 1427 by Leonie Man, RN Outcome: Adequate for Discharge 11/28/2021 1427 by Leonie Man, RN Outcome: Adequate for Discharge Goal: Cardiovascular complication will be avoided 11/28/2021 1427 by Leonie Man, RN Outcome: Adequate for Discharge 11/28/2021 1427 by Leonie Man, RN Outcome: Adequate for Discharge   Problem: Activity: Goal: Risk for activity intolerance will decrease 11/28/2021 1427 by Leonie Man, RN Outcome: Adequate for Discharge 11/28/2021 1427 by Leonie Man, RN Outcome: Adequate for Discharge   Problem: Nutrition: Goal: Adequate nutrition will be maintained 11/28/2021 1427 by Leonie Man, RN Outcome: Adequate for  Discharge 11/28/2021 1427 by Leonie Man, RN Outcome: Adequate for Discharge   Problem: Coping: Goal: Level of anxiety will decrease 11/28/2021 1427 by Leonie Man, RN Outcome: Adequate for Discharge 11/28/2021 1427 by Leonie Man, RN Outcome: Adequate for Discharge   Problem: Elimination: Goal: Will not experience complications related to bowel motility 11/28/2021 1427 by Leonie Man, RN Outcome: Adequate for Discharge 11/28/2021 1427 by Leonie Man, RN Outcome: Adequate for Discharge Goal: Will not experience complications related to urinary retention 11/28/2021 1427 by Leonie Man, RN Outcome: Adequate for Discharge 11/28/2021 1427 by Leonie Man, RN Outcome: Adequate for Discharge   Problem: Pain Managment: Goal: General experience of comfort will improve 11/28/2021 1427 by Leonie Man, RN Outcome: Adequate for Discharge 11/28/2021 1427 by Leonie Man, RN Outcome: Adequate for Discharge   Problem: Safety: Goal: Ability to remain free from injury will improve 11/28/2021 1427 by Leonie Man, RN Outcome: Adequate for Discharge 11/28/2021 1427 by Leonie Man, RN Outcome: Adequate for Discharge   Problem: Skin Integrity: Goal: Risk for impaired skin integrity will decrease 11/28/2021 1427 by Leonie Man, RN Outcome: Adequate for Discharge 11/28/2021 1427 by Leonie Man, RN Outcome: Adequate for Discharge   Problem: Activity: Goal: Ability to tolerate increased activity will improve 11/28/2021 1427 by Leonie Man, RN Outcome: Adequate for Discharge 11/28/2021 1427 by Leonie Man, RN Outcome: Adequate for Discharge Goal: Will verbalize the importance of balancing activity with adequate rest periods 11/28/2021 1427 by Leonie Man, RN Outcome: Adequate for Discharge 11/28/2021 1427 by Leonie Man, RN Outcome: Adequate for Discharge   Problem: Respiratory: Goal: Ability to maintain a clear airway will improve 11/28/2021 1427 by  Leonie Man, RN Outcome: Adequate for Discharge 11/28/2021 1427 by Leonie Man, RN Outcome: Adequate for Discharge  Goal: Levels of oxygenation will improve 11/28/2021 1427 by Leonie Man, RN Outcome: Adequate for Discharge 11/28/2021 1427 by Leonie Man, RN Outcome: Adequate for Discharge Goal: Ability to maintain adequate ventilation will improve 11/28/2021 1427 by Leonie Man, RN Outcome: Adequate for Discharge 11/28/2021 1427 by Leonie Man, RN Outcome: Adequate for Discharge

## 2021-11-28 NOTE — Progress Notes (Signed)
Patient refused to do the desaturation screen at this hour. This RN educated the patient that he will be needing oxygen while going home as his oxygen level goes down and he is short of breath when he walks around. The patient replied "I ain't using any oxygen and I am going home after dialysis". Will pass it on to the oncoming shift and try again later.

## 2021-11-28 NOTE — Discharge Summary (Signed)
Physician Discharge Summary  Romy Mcgue KGM:[id number] DOB: March 22, 1951  PCP: [name], FNP  Admitted from: Home Discharged to: Home  Admit date: 11/24/2021 Discharge date: 11/28/2021  Recommendations for Outpatient Follow-up:    Follow-up Information     Erma Heritage, PA-C Follow up on 12/20/2021.   Specialties: Physician Assistant, Cardiology Why: Cardiology Hospital Follow-up on 12/20/2021 at 2:30 PM. Will be at the [facility] which is attached to [place] information: [place] [address] [phone]         [name], [place]. Schedule an appointment as soon as possible for a visit in 1 week(s).   Specialty: Family Medicine Why: To be seen with repeat labs (CBC & BMP).  Patient needs to follow-up with outpatient cardiology and pulmonology. Contact information: [place] [address] [phone]         Hemodialysis center Follow up on 11/30/2021.   Why: Continue your scheduled dialysis on Tuesdays, Thursdays and Saturdays.                 Home Health: None    Equipment/Devices: None    Discharge Condition: Improved and stable.  However overall prognosis is guarded given history of likely advanced COPD from almost 6 decades of heavy tobacco use, ongoing tobacco use that he does not wish to give up, medication noncompliance, at risk for recurrent COPD exacerbations and acute hypoxic respiratory failure.   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  Seen by his PCP and noted to be hypoxic to 60% on room air.  Admitted for influenza A acute bronchitis and NSTEMI.  Case discussed with cardiologist at [facility] and as per recommendations transferred to [facility] for further evaluation by them and IV heparin initiated.  Cardiology have determined that troponin elevation is more from demand ischemia, echo unremarkable, plan outpatient work-up, IV heparin discontinued.  Continued to have issues with COPD and wheezing.  Suspect he has very advanced COPD given history of 62 years of smoking.  Pulmonology consulted for assistance.     Assessment & Plan:    NSTEMI type II, troponin elevation due to demand ischemia: In the setting of influenza A acute bronchitis and COPD exacerbation.  Denies prior history of MI or CAD.  No chest pain.  HS Troponin cycled and peaked at 546 and trending down (540 > 546 > 452 > 427).  Started on IV heparin.  2D echo: LVEF 60-65%,  no regional wall motion abnormalities and grade 2 diastolic dysfunction moderate aortic valve stenosis.  Continue atorvastatin, aspirin, Plavix and metoprolol tartrate changed to succinate.   Cardiology have determined that troponin elevation is more from demand ischemia, echo unremarkable, plan outpatient work-up, IV heparin discontinued.  Cardiology have signed off and will arrange outpatient follow-up for further evaluation.  Metoprolol was discontinued by nephrology due to BPs down with volume management  across HD.  Influenza A acute bronchitis with COPD exacerbation: Tobacco cessation counseled.  Treated with IV>p.o. steroids, Tamiflu (completed course), bronchodilator nebulization scheduled and as needed, Breo Ellipta and Incruse Ellipta and added flutter valve.  Chest x-ray 12/18 personally reviewed and without acute findings.  Added doxycycline for possible superadded bacterial infection but patient does not want to take due to side effects with antibiotics that cause him to have profuse diarrhea and has refused even today.  Pulmonology consultation appreciated: Very likely has COPD, noncompliant with outpatient therapies for same, they had a lengthy conversation with the patient explaining the importance of compliance with his medical regimen, outpatient follow-up with pulmonology which they will arrange in the [place] office, quitting smoking (patient declines) and getting lung cancer screening.  Will need outpatient PFTs.  Although they had recommended Trelegy at discharge, I just discussed with their team and they have advised to continue prior home meds including albuterol inhaler as needed, Breo Ellipta and Spiriva and further adjustments can be made as outpatient.  Tobacco abuse: Cessation counseled.  Patient not interested in quitting.  Reports that he has been smoking for 62 years.  Acute respiratory failure with hypoxia: Oxygen saturations in the 60s at PCPs office.  Evaluated for home oxygen needs today and patient does not qualify.  Saturating at 95% on room air at rest and 93% on room air while ambulating.  Essential hypertension: Patient was on amlodipine and metoprolol which have been discontinued by nephrology due to control after volume management across HD.  Hyperlipidemia: Continue statins  ESRD on TTS HD: Follows with Dr. Marval Regal, [facility].  Mild hyperkalemia resolved.  Underwent HD on 12/27 and 12/20.  Continue outpatient HD and nephrology follow-up.  Has  hyponatremia which is mild and appears chronic and intermittent.   Anemia in ESRD: Last hemoglobin in system was in the 8 g range.  Presented with hemoglobin of 14.6 which was likely erroneously high.  Hemoglobin has gradually dropped to 9.2 today.  However on looking at his prior numbers in October, his baseline was probably in the 8-9 range.  So this is likely his baseline.  No bleeding reported.  Close outpatient follow-up at dialysis with labs will be drawn.  He also has mild thrombocytopenia which appears chronic and intermittent.  Stable.  Chronic diastolic CHF: Volume management across HD.  Clinically euvolemic and chest x-ray 12/18 without pulmonary edema.   Moderate Aortic Stenosis: OP follow up with serial Echo's  GERD: Patient does not take PPI at home.   PAD: Follows with Dr. Virl Cagey and had interventions on his right lower extremity in October 2022.  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  Continue Plavix and statins.  Tobacco cessation was counseled.   Body mass index is 27.85 kg/m.         Consultants:   Cardiology Nephrology Pulmonology   Procedures:   HD   Discharge Instructions  Discharge Instructions     (HEART FAILURE PATIENTS) Call MD:  Anytime you have any of the  following symptoms: 1) 3 pound weight gain in 24 hours or 5 pounds in 1 week 2) shortness of breath, with or without a dry hacking cough 3) swelling in the hands, feet or stomach 4) if you have to sleep on extra pillows at night in order to breathe.   Complete by: As directed    Call MD for:  difficulty breathing, headache or visual disturbances   Complete by: As directed    Call MD for:  extreme fatigue   Complete by: As directed    Call MD for:  persistant dizziness or light-headedness   Complete by: As directed    Call MD for:  temperature >100.4   Complete by: As directed    Diet - low sodium heart healthy   Complete by: As  directed    Increase activity slowly   Complete by: As directed         Medication List     STOP taking these medications    amLODipine 5 MG tablet Commonly known as: NORVASC   Calcium Carbonate 500 MG Chew   DIALYVITE 800 PO   metoprolol tartrate 25 MG tablet Commonly known as: LOPRESSOR   oxyCODONE-acetaminophen 5-325 MG tablet Commonly known as: Percocet   pantoprazole 40 MG tablet Commonly known as: PROTONIX       TAKE these medications    albuterol 108 (90 Base) MCG/ACT inhaler Commonly known as: VENTOLIN HFA Inhale 2 puffs into the lungs every 4 (four) hours as needed for wheezing or shortness of breath. What changed: when to take this   aspirin EC 81 MG tablet Take 1 tablet (81 mg total) by mouth daily. What changed:  medication strength how much to take when to take this   atorvastatin 40 MG tablet Commonly known as: LIPITOR Take 1 tablet (40 mg total) by mouth daily.   calcitRIOL 0.25 MCG capsule Commonly known as: ROCALTROL Take 1 capsule (0.25 mcg total) by mouth Every Tuesday,Thursday,and Saturday with dialysis.   clopidogrel 75 MG tablet Commonly known as: PLAVIX Take 1 tablet (75 mg total) by mouth daily with breakfast.   fluticasone furoate-vilanterol 200-25 MCG/INH Aepb Commonly known as: BREO ELLIPTA Inhale 1 puff into the lungs daily.   heparin 1000 unit/mL Soln injection Heparin Sodium (Porcine) 1,000 Units/mL Catheter Lock Arterial   MIRCERA IJ Mircera   predniSONE 10 MG tablet Commonly known as: DELTASONE Take 3 tabs daily x3 days, then 2 tabs daily x3 days, then 1 tab daily x3 days, then stop.   Spiriva HandiHaler 18 MCG inhalation capsule Generic drug: tiotropium Place 1 capsule (18 mcg total) into inhaler and inhale daily.   sucroferric oxyhydroxide 500 MG chewable tablet Commonly known as: VELPHORO Chew 1,000 mg by mouth 3 (three) times daily with meals.   tamsulosin 0.4 MG Caps capsule Commonly known as:  FLOMAX Take 0.4 mg by mouth at bedtime.   VITAMIN D3 PO Take by mouth daily.       Allergies  Allergen Reactions   Strawberry (Diagnostic)    Strawberry Extract     Other reaction(s): Unknown      Procedures/Studies: DG CHEST PORT 1 VIEW  Result Date: 11/26/2021 CLINICAL DATA:  Respiratory failure with hypoxia.  Follow-up exam. EXAM: PORTABLE CHEST 1 VIEW COMPARISON:  11/24/2021 and older studies. FINDINGS: Cardiac silhouette normal in size.  No mediastinal or hilar masses. Lungs are hyperexpanded. No residual vascular congestion. Minor opacity at the left lung base consistent with atelectasis or scarring. Lungs otherwise clear. No  convincing pleural effusion and no pneumothorax. Stable right internal jugular dual lumen tunneled central venous catheter. IMPRESSION: 1. No acute cardiopulmonary disease. Electronically Signed   By: Lajean Manes M.D.   On: 11/26/2021 09:52   DG Chest Port 1 View  Result Date: 11/24/2021 CLINICAL DATA:  Chest pain with shortness of breath. EXAM: PORTABLE CHEST 1 VIEW COMPARISON:  Chest x-ray 08/23/2021. FINDINGS: The heart is mildly enlarged, unchanged. There is pulmonary vascular congestion. There is a small right pleural effusion. No pneumothorax. Right-sided central venous catheter tip projects over the mid SVC. There is a healed left clavicular fracture. IMPRESSION: 1. Cardiomegaly with central pulmonary vascular congestion. 2. Small right pleural effusion. Electronically Signed   By: Ronney Asters M.D.   On: 11/24/2021 16:09   ECHOCARDIOGRAM COMPLETE  Result Date: 11/25/2021    ECHOCARDIOGRAM REPORT   Patient Name:   Kerry Underwood Date of Exam: 11/25/2021 Medical Rec #:  [id number]    Height:       65.0 in Accession #:    [id number]   Weight:       170.6 lb Date of Birth:  03/21/1951    BSA:          1.849 m Patient Age:    70 years     BP:           118/68 mmHg Patient Gender: M            HR:           73 bpm. Exam Location:  Inpatient Procedure: 2D  Echo, Cardiac Doppler and Color Doppler Indications:    NSTEMI  History:        Patient has no prior history of Echocardiogram examinations.                 Signs/Symptoms:Shortness of Breath.  Sonographer:    Merrie Roof RDCS Referring Phys: [place]  1. Left ventricular ejection fraction, by estimation, is 60 to 65%. The left ventricle has normal function. The left ventricle has no regional wall motion abnormalities. There is mild left ventricular hypertrophy. Left ventricular diastolic parameters are consistent with Grade II diastolic dysfunction (pseudonormalization).  2. Right ventricular systolic function is normal. The right ventricular size is normal. There is mildly elevated pulmonary artery systolic pressure. The estimated right ventricular systolic pressure is 98.9 mmHg.  3. Left atrial size was mildly dilated.  4. The mitral valve is degenerative. Mild mitral valve regurgitation. Mild calcific mitral stenosis. Mean gradient 9mmHg at HR 71; MVA 1.9cm2 by continuity. Severe mitral annular calcification.  5. The aortic valve is calcified. There is moderate-to-severe calcification of the aortic valve. There is moderate-to-severe thickening of the aortic valve. Aortic valve regurgitation is not visualized. Moderate aortic valve stenosis. AVA 0.91cm2, mean gradient 103mmHg, peak gradient 83mmHg, Vmax 3.36m/s, DI 0.3.  6. The inferior vena cava is dilated in size with >50% respiratory variability, suggesting right atrial pressure of 8 mmHg. Comparison(s): No prior Echocardiogram. FINDINGS  Left Ventricle: Left ventricular ejection fraction, by estimation, is 60 to 65%. The left ventricle has normal function. The left ventricle has no regional wall motion abnormalities. The left ventricular internal cavity size was normal in size. There is  mild left ventricular hypertrophy. Left ventricular diastolic parameters are consistent with Grade II diastolic dysfunction (pseudonormalization). Right  Ventricle: The right ventricular size is normal. No increase in right ventricular wall thickness. Right ventricular systolic function is normal. There is mildly elevated pulmonary artery systolic  pressure. The tricuspid regurgitant velocity is 3.00  m/s, and with an assumed right atrial pressure of 3 mmHg, the estimated right ventricular systolic pressure is 66.4 mmHg. Left Atrium: Left atrial size was mildly dilated. Right Atrium: Right atrial size was normal in size. Pericardium: There is no evidence of pericardial effusion. Mitral Valve: The mitral valve is degenerative in appearance. There is mild thickening of the mitral valve leaflet(s). There is moderate calcification of the mitral valve leaflet(s). Severe mitral annular calcification. Mild mitral valve regurgitation. Mild mitral valve stenosis. Tricuspid Valve: The tricuspid valve is normal in structure. Tricuspid valve regurgitation is mild. Aortic Valve: There is moderate calcification of the aortic valve. There is moderate thickening of the aortic valve. Aortic valve regurgitation is not visualized. Moderate aortic stenosis is present. AVA 0.91cm2, mean gradient 29mmHg, peak gradient 28mmHg, Vmax 3.36m/s, DI 0.3. The aortic valve is calcified.  Pulmonic Valve: The pulmonic valve was normal in structure. Pulmonic valve regurgitation is trivial. Aorta: The aortic root is normal in size and structure. Venous: The inferior vena cava is dilated in size with greater than 50% respiratory variability, suggesting right atrial pressure of 8 mmHg. IAS/Shunts: The atrial septum is grossly normal.  LEFT VENTRICLE PLAX 2D LVIDd:         4.60 cm   Diastology LVIDs:         3.10 cm   LV e' medial:    8.94 cm/s LV PW:         1.30 cm   LV E/e' medial:  17.0 LV IVS:        1.10 cm   LV e' lateral:   9.38 cm/s LVOT diam:     2.10 cm   LV E/e' lateral: 16.2 LV SV:         187 LV SV Index:   101 LVOT Area:     3.46 cm  RIGHT VENTRICLE             IVC RV S prime:     10.70  cm/s  IVC diam: 2.10 cm TAPSE (M-mode): 1.2 cm LEFT ATRIUM             Index        RIGHT ATRIUM           Index LA diam:        4.30 cm 2.33 cm/m   RA Area:     19.10 cm LA Vol (A2C):   65.4 ml 35.37 ml/m  RA Volume:   52.20 ml  28.23 ml/m LA Vol (A4C):   75.6 ml 40.89 ml/m LA Biplane Vol: 74.1 ml 40.07 ml/m  AORTIC VALVE AV Area (Vmax):    2.37 cm AV Area (Vmean):   2.40 cm AV Area (VTI):     2.48 cm AV Vmax:           328.50 cm/s AV Vmean:          236.500 cm/s AV VTI:            0.752 m AV Peak Grad:      43.2 mmHg AV Mean Grad:      25.0 mmHg LVOT Vmax:         225.00 cm/s LVOT Vmean:        164.150 cm/s LVOT VTI:          0.538 m LVOT/AV VTI ratio: 0.72  AORTA Ao Root diam: 3.10 cm Ao Asc diam:  3.60 cm MITRAL VALVE  TRICUSPID VALVE MV Area (PHT): 3.12 cm     TR Peak grad:   36.0 mmHg MV Decel Time: 243 msec     TR Vmax:        300.00 cm/s MV E velocity: 152.00 cm/s MV A velocity: 116.00 cm/s  SHUNTS MV E/A ratio:  1.31         Systemic VTI:  0.54 m                             Systemic Diam: 2.10 cm Gwyndolyn Kaufman MD Electronically signed by Gwyndolyn Kaufman MD Signature Date/Time: 11/25/2021/3:35:30 PM    Final       Subjective: Anxious to go home.  Due to freezing temperatures, he states that he has to be home to make sure everything is okay at home pertaining to heat etc.  Indicates that his breathing is back to baseline.  I saw him prior to HD.  Subsequently he returned from HD and felt sick and did not complete the dialysis cycle.  Patient insisting on going home.  Could not discharge patient earlier in the day due to dialysis needs from which she returned in the afternoon.  Discharge Exam:  Vitals:   11/28/21 1100 11/28/21 1133 11/28/21 1140 11/28/21 1407  BP: 117/73 139/81 122/80   Pulse: 93 91 73 93  Resp: 18 19 19 19   Temp:  (!) 97.1 F (36.2 C) (!) 97.1 F (36.2 C)   TempSrc:      SpO2:    97%  Weight:   75.5 kg   Height:        General exam: Early  male moderately built and nourished lying propped up in bed, comfortable and in no obvious distress. Respiratory system: Improved breath sounds.  Still reduced and distant but this is likely chronic and at baseline for him.  No wheezing, rhonchi or crackles appreciated.  No increased work of breathing.  Able to speak in full sentences without difficulty.  Right upper anterior chest HD catheter in place. Cardiovascular system: S1 and S2 heard, RRR.  No JVD, murmurs or pedal edema.  Telemetry personally reviewed: Sinus rhythm. Gastrointestinal system: Abdomen is nondistended, soft and nontender. No organomegaly or masses felt. Normal bowel sounds heard. Central nervous system: Alert and oriented. No focal neurological deficits. Extremities: Symmetric 5 x 5 power.  Right lower extremity medial aspect of leg and thigh with vascular intervention surgical scar. Skin: No rashes, lesions or ulcers Psychiatry: Judgement and insight appear normal. Mood & affect appropriate.       The results of significant diagnostics from this hospitalization (including imaging, microbiology, ancillary and laboratory) are listed below for reference.     Microbiology: Recent Results (from the past 240 hour(s))  Resp Panel by RT-PCR (Flu A&B, Covid)     Status: Abnormal   Collection Time: 11/24/21  4:20 PM  Result Value Ref Range Status   SARS Coronavirus 2 by RT PCR NEGATIVE NEGATIVE Final    Comment: (NOTE) SARS-CoV-2 target nucleic acids are NOT DETECTED.  The SARS-CoV-2 RNA is generally detectable in upper respiratory specimens during the acute phase of infection. The lowest concentration of SARS-CoV-2 viral copies this assay can detect is 138 copies/mL. A negative result does not preclude SARS-Cov-2 infection and should not be used as the sole basis for treatment or other patient management decisions. A negative result may occur with  improper specimen collection/handling, submission of specimen other than  nasopharyngeal swab, presence of viral mutation(s) within the areas targeted by this assay, and inadequate number of viral copies(<138 copies/mL). A negative result must be combined with clinical observations, patient history, and epidemiological information. The expected result is Negative.  Fact Sheet for Patients:  EntrepreneurPulse.com.au  Fact Sheet for Healthcare Providers:  IncredibleEmployment.be  This test is no t yet approved or cleared by the [place] FDA and  has been authorized for detection and/or diagnosis of SARS-CoV-2 by FDA under an Emergency Use Authorization (EUA). This EUA will remain  in effect (meaning this test can be used) for the duration of the COVID-19 declaration under Section 564(b)(1) of the Act, 21 U.S.C.section 360bbb-3(b)(1), unless the authorization is terminated  or revoked sooner.       Influenza A by PCR POSITIVE (A) NEGATIVE Final   Influenza B by PCR NEGATIVE NEGATIVE Final    Comment: (NOTE) The Xpert Xpress SARS-CoV-2/FLU/RSV plus assay is intended as an aid in the diagnosis of influenza from Nasopharyngeal swab specimens and should not be used as a sole basis for treatment. Nasal washings and aspirates are unacceptable for Xpert Xpress SARS-CoV-2/FLU/RSV testing.  Fact Sheet for Patients: EntrepreneurPulse.com.au  Fact Sheet for Healthcare Providers: IncredibleEmployment.be  This test is not yet approved or cleared by the [place] FDA and has been authorized for detection and/or diagnosis of SARS-CoV-2 by FDA under an Emergency Use Authorization (EUA). This EUA will remain in effect (meaning this test can be used) for the duration of the COVID-19 declaration under Section 564(b)(1) of the Act, 21 U.S.C. section 360bbb-3(b)(1), unless the authorization is terminated or revoked.  Performed at [facility], [address], [place], [place] [address]    MRSA Next Gen by PCR, Nasal     Status: None   Collection Time: 11/24/21 10:46 PM   Specimen: Nasal Mucosa; Nasal Swab  Result Value Ref Range Status   MRSA by PCR Next Gen NOT DETECTED NOT DETECTED Final    Comment: (NOTE) The GeneXpert MRSA Assay (FDA approved for NASAL specimens only), is one component of a comprehensive MRSA colonization surveillance program. It is not intended to diagnose MRSA infection nor to guide or monitor treatment for MRSA infections. Test performance is not FDA approved in patients less than 63 years old. Performed at [facility], [place] [address], [place],  [address]      Labs: CBC: Recent Labs  Lab 11/24/21 1620 11/25/21 0430 11/26/21 0026 11/27/21 0051 11/28/21 0045  WBC 6.8 3.5* 5.1 5.2 4.9  NEUTROABS 5.9  --   --   --   --   HGB 14.6 12.2* 11.8* 10.0* 9.2*  HCT 48.5 39.6 37.4* 32.1* 29.0*  MCV 96.0 91.9 89.9 89.9 89.2  PLT 154 144* 172 149* 140*    Basic Metabolic Panel: Recent Labs  Lab 11/24/21 1620 11/25/21 0559 11/26/21 0026 11/28/21 0924  NA 133* 133*  --  130*  K 5.3* 5.0  --  3.9  CL 92* 96*  --  97*  CO2 26 20*  --  21*  GLUCOSE 124* 129*  --  160*  BUN 41* 58*  --  129*  CREATININE 6.65* 7.55*  --  7.87*  CALCIUM 7.6* 7.3*  --  7.6*  PHOS  --   --  5.3* 2.6    Liver Function Tests: Recent Labs  Lab 11/28/21 0924  ALBUMIN 2.6*     Time coordinating discharge: 25 minutes  SIGNED:  Vernell Leep, MD,  FACP, FHM,  [facility], [facility] (Care Management Physician Certified). [facility]  To contact the attending provider between 7A-7P or the covering provider during after hours 7P-7A, please log into the web site [email or web address] and access using universal [place] password for that web site. If you do not have the password, please call the hospital operator.

## 2021-11-28 NOTE — Progress Notes (Signed)
SATURATION QUALIFICATIONS: (This note is used to comply with regulatory documentation for home oxygen)  Patient Saturations on Room Air at Rest = 95%  Patient Saturations on Room Air while Ambulating = 93%  Patient Saturations on N/A Liters of oxygen while Ambulating = N/A  Please briefly explain why patient needs home oxygen: N/A

## 2021-11-28 NOTE — TOC Initial Note (Signed)
Transition of Care [facility]) - Initial/Assessment Note    Patient Details  Name: Kerry Underwood MRN: [id number] Date of Birth: 1951/07/12  Transition of Care [facility]) CM/SW Contact:    Zenon Mayo, RN Phone Number: 11/28/2021, 2:35 PM  Clinical Narrative:                 Patient is for dc today, he states his friend's wife will be transporting him home today.  He states he is good with his medications.  He has no needs.   Expected Discharge Plan: Home/Self Care Barriers to Discharge: No Barriers Identified   Patient Goals and CMS Choice Patient states their goals for this hospitalization and ongoing recovery are:: return home   Choice offered to / list presented to : NA  Expected Discharge Plan and Services Expected Discharge Plan: Home/Self Care   Discharge Planning Services: CM Consult Post Acute Care Choice: NA Living arrangements for the past 2 months: Single Family Home Expected Discharge Date: 11/28/21                 DME Agency: NA       HH Arranged: NA          Prior Living Arrangements/Services Living arrangements for the past 2 months: Single Family Home Lives with:: Self Patient language and need for interpreter reviewed:: Yes Do you feel safe going back to the place where you live?: Yes      Need for Family Participation in Patient Care: No (Comment) Care giver support system in place?: No (comment) Current home services: DME (cane) Criminal Activity/Legal Involvement Pertinent to Current Situation/Hospitalization: No - Comment as needed  Activities of Daily Living Home Assistive Devices/Equipment: None ADL Screening (condition at time of admission) Patient's cognitive ability adequate to safely complete daily activities?: Yes Is the patient deaf or have difficulty hearing?: No Does the patient have difficulty seeing, even when wearing glasses/contacts?: No Does the patient have difficulty concentrating, remembering, or making decisions?:  No Patient able to express need for assistance with ADLs?: Yes Does the patient have difficulty dressing or bathing?: No Independently performs ADLs?: Yes (appropriate for developmental age) Does the patient have difficulty walking or climbing stairs?: No Weakness of Legs: None Weakness of Arms/Hands: None  Permission Sought/Granted                  Emotional Assessment   Attitude/Demeanor/Rapport: Engaged Affect (typically observed): Appropriate Orientation: : Oriented to  Time, Oriented to Situation, Oriented to Place, Oriented to Self Alcohol / Substance Use: Not Applicable Psych Involvement: No (comment)  Admission diagnosis:  SOB (shortness of breath) [R06.02] Influenza A [J10.1] NSTEMI (non-ST elevated myocardial infarction) ([place]) [I21.4] Acute respiratory failure with hypoxia ([place]) [J96.01] ESRD on hemodialysis ([place]) [N18.6, Z99.2] Patient Active Problem List   Diagnosis Date Noted   Demand ischemia ([place]) 11/26/2021   Peripheral vascular disease ([place]) 09/25/2021   Nausea 09/20/2021   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   Cellulitis of right foot 09/15/2021   Vitamin D deficiency 08/23/2021   Acute respiratory failure with hypoxia ([place]) 06/29/2021   Influenza A Infection 11/22/2020   COPD with acute exacerbation ([place]) 09/17/2020   ESRD on hemodialysis ([place]) 05/11/2020   Chronic viral hepatitis C ([place]) 04/25/2020   Unspecified protein-calorie malnutrition ([place]) 04/25/2020   Chronic diastolic heart failure ([place]) 04/20/2020   Secondary hyperparathyroidism of renal origin ([place]) 04/20/2020   Anemia due to chronic kidney disease 04/15/2020   Benign prostatic hyperplasia with urinary  retention    Hypertension secondary to other renal disorders    PCP:  [name], FNP Pharmacy:   [id number] - [place], [address] [place] [address] Phone: [phone] Fax: [phone]     Social  Determinants of Health (SDOH) Interventions    Readmission Risk Interventions Readmission Risk Prevention Plan 11/28/2021 09/18/2021  Transportation Screening Complete Complete  HRI or [place] - Complete  Social Work Consult for [place] Planning/Counseling - Complete  Palliative Care Screening - Not Applicable  Medication Review Press photographer) Complete Complete  PCP or Specialist appointment within 3-5 days of discharge Complete -  [place] or Home Care Consult Complete -  SW Recovery Care/Counseling Consult Complete -  Palliative Care Screening Not Applicable -  [place] Not Applicable -  Some recent data might be hidden

## 2021-11-28 NOTE — Progress Notes (Signed)
Pt refused Phos binder, stating it was too late, he had just ate. RN educated pt that he was just eating cake less than 60 seconds ago, therefore it was not too late. Pt agreed to Phos binder.   Pt refused antibiotic citing abdominal discomfort and diarrhea. RN educated that antibiotics taken on an empty stomach can cause such effects. Pt again refused antibiotics. Will notify MD.

## 2021-11-28 NOTE — Progress Notes (Signed)
Mobility Specialist Progress Note:   11/28/21 1011  Mobility  Activity Off unit   [facility] Primary Phone [phone] Secondary Phone [phone]

## 2021-11-28 NOTE — Discharge Instructions (Signed)

## 2021-11-28 NOTE — TOC Transition Note (Signed)
Transition of Care [facility]) - CM/SW Discharge Note   Patient Details  Name: Kerry Underwood MRN: [id number] Date of Birth: [birth year]  Transition [facility] Care [facility]) CM/SW Contact:  Zenon Mayo, RN Phone Number: 11/28/2021, 2:37 PM   Clinical Narrative:    Patient is for dc today, he states his friend's wife will be transporting him home today.  He states he is good with his medications.  He has no needs.     Final next level of care: Home/Self Care Barriers to Discharge: No Barriers Identified   Patient Goals and CMS Choice Patient states their goals for this hospitalization and ongoing recovery are:: return home   Choice offered to / list presented to : NA  Discharge Placement                       Discharge Plan and Services   Discharge Planning Services: CM Consult Post Acute Care Choice: NA            DME Agency: NA       HH Arranged: NA          Social Determinants of Health (SDOH) Interventions     Readmission Risk Interventions Readmission Risk Prevention Plan 11/28/2021 09/18/2021  Transportation Screening Complete Complete  HRI or [place] - Complete  Social Work Consult for [place] Planning/Counseling - Complete  Palliative Care Screening - Not Applicable  Medication Review Press photographer) Complete Complete  PCP or Specialist appointment within 3-5 days of discharge Complete -  [place] or Home Care Consult Complete -  SW Recovery Care/Counseling Consult Complete -  Palliative Care Screening Not Applicable -  [place] Not Applicable -  Some recent data might be hidden

## 2021-11-28 NOTE — Progress Notes (Signed)
Mobility Specialist Progress Note   11/28/21 1315  Mobility  Activity Ambulated in hall  Level of Assistance Standby assist, set-up cues, supervision of patient - no hands on  Assistive Device None  Distance Ambulated (ft) 420 ft  Mobility Ambulated independently in hallway  Mobility Response Tolerated well  Mobility performed by Mobility specialist  $Mobility charge 1 Mobility   Received pt EOB w/ no complaint and agreeable. x2 standing rest breaks d/t stated "pain in ankles" along w/ min signs of DOE. Returned back to bed w/ call bell in reach.   Pre Mobility: 83 HR,  95% SpO2 During Mobility: 95 HR,  93% SpO2 Post Mobility: 90 HR,  94% SpO2  Holland Falling Mobility Specialist Phone Number [phone]

## 2021-11-28 NOTE — Procedures (Signed)
I was present at this dialysis session. I have reviewed the session itself and made appropriate changes.   Vital signs in last 24 hours:  Temp:  [97.8 F (36.6 C)-98.7 F (37.1 C)] 98.7 F (37.1 C) (12/20 0839) Pulse Rate:  [76-97] 97 (12/20 0839) Resp:  [16-24] 22 (12/20 0839) BP: (111-149)/(48-74) 139/74 (12/20 0839) SpO2:  [93 %-100 %] 95 % (12/20 0839) Weight:  [76 kg-76.3 kg] 76 kg (12/20 0900) Weight change: 0.4 kg Filed Weights   11/27/21 0601 11/28/21 0545 11/28/21 0900  Weight: 75.9 kg 76.3 kg 76 kg    Recent Labs  Lab 11/25/21 0559 11/26/21 0026  NA 133*  --   K 5.0  --   CL 96*  --   CO2 20*  --   GLUCOSE 129*  --   BUN 58*  --   CREATININE 7.55*  --   CALCIUM 7.3*  --   PHOS  --  5.3*    Recent Labs  Lab 11/24/21 1620 11/25/21 0430 11/26/21 0026 11/27/21 0051 11/28/21 0045  WBC 6.8   < > 5.1 5.2 4.9  NEUTROABS 5.9  --   --   --   --   HGB 14.6   < > 11.8* 10.0* 9.2*  HCT 48.5   < > 37.4* 32.1* 29.0*  MCV 96.0   < > 89.9 89.9 89.2  PLT 154   < > 172 149* 140*   < > = values in this interval not displayed.    Scheduled Meds:  aspirin EC  81 mg Oral Daily   atorvastatin  40 mg Oral QPM   calcitRIOL  0.25 mcg Oral Q T,Th,Sa-HD   calcium carbonate  500 mg Oral QHS   Chlorhexidine Gluconate Cloth  6 each Topical Q0600   clopidogrel  75 mg Oral Daily   doxycycline  100 mg Oral Q12H   fluticasone furoate-vilanterol  1 puff Inhalation Daily   heparin  1,500 Units Dialysis Once in dialysis   heparin injection (subcutaneous)  5,000 Units Subcutaneous Q8H   ipratropium-albuterol  3 mL Nebulization TID   oseltamivir  30 mg Oral Q T,Th,Sa-HD   pantoprazole  40 mg Oral Daily   predniSONE  40 mg Oral Q breakfast   sucroferric oxyhydroxide  1,000 mg Oral TID WC   tamsulosin  0.4 mg Oral QHS   umeclidinium bromide  1 puff Inhalation Daily   Continuous Infusions: PRN Meds:.acetaminophen, albuterol, guaiFENesin-dextromethorphan, ondansetron **OR**  ondansetron (ZOFRAN) IV, oxyCODONE-acetaminophen     OP HD: TTS [facility]     4h 70min  80.7kg   350/500  P4   RIJ TDC  Heparin  5000  - no esa  - calcitriol 0.25 ug tiw po     Assessment/ Plan: NSTEMI - trop 540 range, per pmd / cardiology felt to be demand ischemia, ECHO without change.  Off IV heparin and will have ongoing outpatient workup. +Flu A - per pmd, s/p tamiflu, steroids (now po), and nebulizers.  Started doxycycline for possible concomitant bacterial infection. SOB - initial CXR + for vasc congestion. Got 3.5 L off w/ HD yesterday but w/ cramping mid HD ongoing this am. Came off 3kg under at 77kg. F/U CXR shows resolved vasc congestion. Doubt current SOB due to vol overload, suspect upper airway/ COPD/ flu primary issues.  Will lower outpatient EDW to post weight today. ESRD - on HD TTS.  Using [facility]. HD today to keep on outpatient schedule.  Pre weight of 76 kg (had  been 77 kg).  Will need standing weight at end of HD to help with new outpatient EDW.  HD access - on HD 1 year, using TDC placed May 2021. Also had 1st stage BVT May 2021 but don't see that 2nd stage has been done. Have referred multiple times for second stage, however pt noncompliant with outpatient follow up. If he will be staying in hospital will consult VVS.  HTN  -BP's down w/ volume removal, have dc'd norvasc and BB for now Anemia ckd - Hb > 10, no esa needs MBD ckd - Ca ok, check phos, cont vdra, binders PAD - had RLE bypass surgery in October COPD - seen by Pulmonology who recommended regular use of inhalers and will schedule outpatient follow up in [place]. H/o CVA Disposition - discharge to home today per primary.  Kerry Potts,  MD 11/28/2021, 9:20 AM

## 2021-11-30 NOTE — TOC Transition Note (Signed)
Transition of care contact from inpatient facility  Date of discharge: 11/28/21 Date of contact: 11/30/21 Method: Phone Spoke to: Patient  Patient contacted to discuss transition of care from recent inpatient hospitalization. Patient was admitted to [facility] from 11/24/21-11/28/21 with discharge diagnosis of Influenza A/Acute Bronchitis/COPD Exacerbation.   Medication changes were reviewed. Patient currently on Prednisone taper. He denies needing any medication re-fills.  Patient received HD today at [facility]. He reports tolerating the procedure well. Plan for renal team to see patient at next scheduled HD.  Tobie Poet, NP

## 2021-12-01 ENCOUNTER — Encounter: Payer: Self-pay | Admitting: Family Medicine

## 2021-12-01 ENCOUNTER — Ambulatory Visit (INDEPENDENT_AMBULATORY_CARE_PROVIDER_SITE_OTHER): Payer: 59 | Admitting: Family Medicine

## 2021-12-01 VITALS — BP 151/84 | HR 105 | Temp 97.3°F | Ht 65.0 in | Wt 174.3 lb

## 2021-12-01 DIAGNOSIS — I248 Other forms of acute ischemic heart disease: Secondary | ICD-10-CM

## 2021-12-01 DIAGNOSIS — I5032 Chronic diastolic (congestive) heart failure: Secondary | ICD-10-CM

## 2021-12-01 DIAGNOSIS — I2489 Other forms of acute ischemic heart disease: Secondary | ICD-10-CM

## 2021-12-01 DIAGNOSIS — J441 Chronic obstructive pulmonary disease with (acute) exacerbation: Secondary | ICD-10-CM | POA: Diagnosis not present

## 2021-12-01 DIAGNOSIS — J101 Influenza due to other identified influenza virus with other respiratory manifestations: Secondary | ICD-10-CM

## 2021-12-01 DIAGNOSIS — Z992 Dependence on renal dialysis: Secondary | ICD-10-CM

## 2021-12-01 DIAGNOSIS — J9601 Acute respiratory failure with hypoxia: Secondary | ICD-10-CM | POA: Diagnosis not present

## 2021-12-01 DIAGNOSIS — N186 End stage renal disease: Secondary | ICD-10-CM

## 2021-12-01 NOTE — Progress Notes (Signed)
Subjective:  Patient ID: Kerry Underwood, male    DOB: 1951-08-08, 70 y.o.   MRN: [id number]  Patient Care Team: [name], FNP as PCP - General ([facility]   Chief Complaint:  Hospitalization Follow-up   HPI: Kerry Underwood is a 70 y.o. male presenting on 12/01/2021 for Hospitalization Follow-up   Patient presents today for hospital discharge follow-up.  He was seen in office on 11/24/2021 for acute respiratory distress and saturations were noted to be in the high 50s to low 60s.  EMS was called and patient was taken to [facility] emergency department.  Troponins were noted to be high at any pensive patient was transferred ported to [facility].  Cardiology deemed reason for elevation is demand ischemia not a NSTEMI.  He was admitted for acute respiratory failure with hypoxia, influenza A, demand ischemia, COPD with acute exacerbation, chronic diastolic heart failure, and end-stage renal disease on hemodialysis TTS.  During admission cardiology, pulmonology, and nephrology were consulted.  He has follow ups with cardiology scheduled.  Nephrology adjusted his hypertensive regimen during admission.  He had dialysis yesterday and no medication adjustments were made.  He does not know his wet or dry weights.  States he will see nephrology tomorrow during dialysis and will discuss hypertensive regimen. He states he has been doing okay since home.  He is supposed to be on Spiriva and Breo inhalers, but has not been using either.  He does have albuterol as needed. He had labs done yesterday during dialysis and those results have been requested  Relevant past medical, surgical, family, and social history reviewed and updated as indicated.  Allergies and medications reviewed and updated. Data reviewed: Chart in Epic.   Past Medical History:  Diagnosis Date   Anemia    CHF (congestive heart failure) (HCC)    COPD (chronic obstructive pulmonary disease) ([place])     Full dentures    Hypertension    Pneumonia    Pyelonephritis 09/11/2017   Renal disorder    Stroke [facility])    " A few mini strokes, I didn't know it "    Past Surgical History:  Procedure Laterality Date   ABDOMINAL AORTOGRAM W/LOWER EXTREMITY Bilateral 09/20/2021   Procedure: ABDOMINAL AORTOGRAM W/LOWER EXTREMITY;  Surgeon: Broadus John, MD;  Location: [place] [facility];  Service: Cardiovascular;  Laterality: Bilateral;   AMPUTATION TOE Right 09/22/2021   Procedure: PARTIAL AMPUTATION SECOND RIGHT TOE;  Surgeon: Marty Heck, MD;  Location: [place];  Service: Vascular;  Laterality: Right;   [place] Right 04/19/2020   Procedure: Basilic Vein Transposition Right Arm;  Surgeon: Waynetta Sandy, MD;  Location: [place];  Service: Vascular;  Laterality: Right;   COLON SURGERY     ENDARTERECTOMY FEMORAL Right 09/22/2021   Procedure: ENDARTERECTOMY FEMORAL WITH PATCH ANGIOPLASTY USING 1X6CM Weyman Pedro;  Surgeon: Marty Heck, MD;  Location: [place];  Service: Vascular;  Laterality: Right;   FEMORAL-TIBIAL BYPASS GRAFT Right 09/22/2021   Procedure: RIGHT FEMORAL TO BELOW KNEE POPLITEAL BYPASS WITH HARVEST OF GREATER SAPHENOUS VEIN;  Surgeon: Marty Heck, MD;  Location: [place];  Service: Vascular;  Laterality: Right;   INSERTION OF DIALYSIS CATHETER Right 04/19/2020   Procedure: Insertion Of Right Internal Jugular Dialysis Catheter;  Surgeon: Waynetta Sandy, MD;  Location: [place];  Service: Vascular;  Laterality: Right;   MULTIPLE TOOTH EXTRACTIONS     PERIPHERAL VASCULAR BALLOON ANGIOPLASTY Right 09/20/2021  Procedure: PERIPHERAL VASCULAR BALLOON ANGIOPLASTY;  Surgeon: Broadus John, MD;  Location: [place] [facility];  Service: Cardiovascular;  Laterality: Right;  SFA  FAILED    Social History   Socioeconomic History   Marital status: Single    Spouse name: Not on file   Number of children: Not on file   Years of  education: Not on file   Highest education level: Not on file  Occupational History   Occupation: retired  Tobacco Use   Smoking status: Every Day    Packs/day: 0.50    Years: 62.00    Pack years: 31.00    Types: Cigarettes   Smokeless tobacco: Former    Types: Chew   Tobacco comments:    Started smoking at age 34 - rolls his own tobacco cigarettes   Vaping Use   Vaping Use: Never used  Substance and Sexual Activity   Alcohol use: No   Drug use: Not Currently    Types: Marijuana   Sexual activity: Not Currently    Birth control/protection: None  Other Topics Concern   Not on file  Social History Narrative   Lives alone. No family nearby. He does get out and socialize with friends.    Doesn't have  a good relationship with his children   Social Determinants of Health   Financial Resource Strain: Medium Risk   Difficulty of Paying Living Expenses: Somewhat hard  Food Insecurity: No Food Insecurity   Worried About Charity fundraiser in the Last Year: Never true   Ran Out of Food in the Last Year: Never true  Transportation Needs: No Transportation Needs   Lack of Transportation (Medical): No   Lack of Transportation (Non-Medical): No  Physical Activity: Sufficiently Active   Days of Exercise per Week: 5 days   Minutes of Exercise per Session: 30 min  Stress: No Stress Concern Present   Feeling of Stress : Not at all  Social Connections: Socially Isolated   Frequency of Communication with Friends and Family: More than three times a week   Frequency of Social Gatherings with Friends and Family: More than three times a week   Attends Religious Services: Never   Marine scientist or Organizations: No   Attends Archivist Meetings: Never   Marital Status: Never married  Human resources officer Violence: Not At Risk   Fear of Current or Ex-Partner: No   Emotionally Abused: No   Physically Abused: No   Sexually Abused: No    Outpatient Encounter Medications as  of 12/01/2021  Medication Sig   albuterol (VENTOLIN HFA) 108 (90 Base) MCG/ACT inhaler Inhale 2 puffs into the lungs every 4 (four) hours as needed for wheezing or shortness of breath.   aspirin EC 81 MG tablet Take 1 tablet (81 mg total) by mouth daily.   atorvastatin (LIPITOR) 40 MG tablet Take 1 tablet (40 mg total) by mouth daily.   Cholecalciferol (VITAMIN D3 PO) Take by mouth daily.   clopidogrel (PLAVIX) 75 MG tablet Take 1 tablet (75 mg total) by mouth daily with breakfast.   fluticasone furoate-vilanterol (BREO ELLIPTA) 200-25 MCG/INH AEPB Inhale 1 puff into the lungs daily.   heparin 1000 unit/mL SOLN injection Heparin Sodium (Porcine) 1,000 Units/mL Catheter Lock Arterial   Methoxy PEG-Epoetin Beta (MIRCERA IJ) Mircera   predniSONE (DELTASONE) 10 MG tablet Take 3 tabs daily x3 days, then 2 tabs daily x3 days, then 1 tab daily x3 days, then stop.   sucroferric oxyhydroxide (  VELPHORO) 500 MG chewable tablet Chew 1,000 mg by mouth 3 (three) times daily with meals.   tamsulosin (FLOMAX) 0.4 MG CAPS capsule Take 0.4 mg by mouth at bedtime.   tiotropium (SPIRIVA HANDIHALER) 18 MCG inhalation capsule Place 1 capsule (18 mcg total) into inhaler and inhale daily.   calcitRIOL (ROCALTROL) 0.25 MCG capsule Take 1 capsule (0.25 mcg total) by mouth Every Tuesday,Thursday,and Saturday with dialysis. (Patient not taking: Reported on 11/24/2021)   No facility-administered encounter medications on file as of 12/01/2021.    Allergies  Allergen Reactions   Strawberry (Diagnostic)    Strawberry Extract     Other reaction(s): Unknown    Review of Systems  Constitutional:  Positive for activity change and appetite change. Negative for chills, diaphoresis, fatigue, fever and unexpected weight change.  Respiratory:  Positive for cough and shortness of breath (Improving). Negative for apnea, choking, chest tightness, wheezing and stridor.   Cardiovascular:  Negative for chest pain, palpitations and  leg swelling.  Gastrointestinal:  Negative for abdominal pain, diarrhea, nausea and vomiting.  Neurological:  Negative for dizziness, tremors, seizures, syncope, facial asymmetry, speech difficulty, weakness, light-headedness, numbness and headaches.  Psychiatric/Behavioral:  Negative for confusion.   All other systems reviewed and are negative.      Objective:  BP (!) 151/84    Pulse (!) 105    Temp (!) 97.3 F (36.3 C)    Ht 5\' 5"  (1.651 m)    Wt 174 lb 4.8 oz (79.1 kg)    SpO2 95%    BMI 29.01 kg/m    Wt Readings from Last 3 Encounters:  12/01/21 174 lb 4.8 oz (79.1 kg)  11/28/21 166 lb 7.2 oz (75.5 kg)  11/22/21 186 lb (84.4 kg)    Physical Exam Vitals and nursing note reviewed.  Constitutional:      Appearance: He is overweight. He is ill-appearing (chronically). He is not toxic-appearing or diaphoretic.  HENT:     Head: Normocephalic and atraumatic.     Mouth/Throat:     Mouth: Mucous membranes are moist.  Eyes:     Conjunctiva/sclera: Conjunctivae normal.     Pupils: Pupils are equal, round, and reactive to light.  Cardiovascular:     Rate and Rhythm: Normal rate and regular rhythm.     Heart sounds: Normal heart sounds. No murmur heard.   No friction rub. No gallop.  Pulmonary:     Effort: Pulmonary effort is normal.     Breath sounds: Decreased breath sounds and wheezing present. No rhonchi or rales.  Chest:       Comments: AV fistula to right lower arm with good bruit and thrill Musculoskeletal:     Cervical back: Normal range of motion and neck supple.  Skin:    General: Skin is warm and dry.     Capillary Refill: Capillary refill takes less than 2 seconds.  Neurological:     General: No focal deficit present.     Mental Status: He is alert and oriented to person, place, and time.     Gait: Gait normal.  Psychiatric:        Mood and Affect: Mood normal.        Behavior: Behavior normal. Behavior is cooperative.        Thought Content: Thought content  normal.        Judgment: Judgment normal.    Results for orders placed or performed during the hospital encounter of 11/24/21  Resp Panel by RT-PCR (Flu A&B,  Covid)  Result Value Ref Range   SARS Coronavirus 2 by RT PCR NEGATIVE NEGATIVE   Influenza A by PCR POSITIVE (A) NEGATIVE   Influenza B by PCR NEGATIVE NEGATIVE  MRSA Next Gen by PCR, Nasal   Specimen: Nasal Mucosa; Nasal Swab  Result Value Ref Range   MRSA by PCR Next Gen NOT DETECTED NOT DETECTED  Basic metabolic panel  Result Value Ref Range   Sodium 133 (L) 135 - 145 mmol/L   Potassium 5.3 (H) 3.5 - 5.1 mmol/L   Chloride 92 (L) 98 - 111 mmol/L   CO2 26 22 - 32 mmol/L   Glucose, Bld 124 (H) 70 - 99 mg/dL   BUN 41 (H) 8 - 23 mg/dL   Creatinine, Ser 6.65 (H) 0.61 - 1.24 mg/dL   Calcium 7.6 (L) 8.9 - 10.3 mg/dL   GFR, Estimated 8 (L) >60 mL/min   Anion gap 15 5 - 15  CBC with Differential  Result Value Ref Range   WBC 6.8 4.0 - 10.5 K/uL   RBC 5.05 4.22 - 5.81 MIL/uL   Hemoglobin 14.6 13.0 - 17.0 g/dL   HCT 48.5 39.0 - 52.0 %   MCV 96.0 80.0 - 100.0 fL   MCH 28.9 26.0 - 34.0 pg   MCHC 30.1 30.0 - 36.0 g/dL   RDW 17.2 (H) 11.5 - 15.5 %   Platelets 154 150 - 400 K/uL   nRBC 0.0 0.0 - 0.2 %   Neutrophils Relative % 87 %   Neutro Abs 5.9 1.7 - 7.7 K/uL   Lymphocytes Relative 7 %   Lymphs Abs 0.5 (L) 0.7 - 4.0 K/uL   Monocytes Relative 6 %   Monocytes Absolute 0.4 0.1 - 1.0 K/uL   Eosinophils Relative 0 %   Eosinophils Absolute 0.0 0.0 - 0.5 K/uL   Basophils Relative 0 %   Basophils Absolute 0.0 0.0 - 0.1 K/uL   Immature Granulocytes 0 %   Abs Immature Granulocytes 0.01 0.00 - 0.07 K/uL  Heparin level (unfractionated)  Result Value Ref Range   Heparin Unfractionated 0.25 (L) 0.30 - 0.70 IU/mL  Basic metabolic panel  Result Value Ref Range   Sodium 133 (L) 135 - 145 mmol/L   Potassium 5.0 3.5 - 5.1 mmol/L   Chloride 96 (L) 98 - 111 mmol/L   CO2 20 (L) 22 - 32 mmol/L   Glucose, Bld 129 (H) 70 - 99 mg/dL    BUN 58 (H) 8 - 23 mg/dL   Creatinine, Ser 7.55 (H) 0.61 - 1.24 mg/dL   Calcium 7.3 (L) 8.9 - 10.3 mg/dL   GFR, Estimated 7 (L) >60 mL/min   Anion gap 17 (H) 5 - 15  Heparin level (unfractionated)  Result Value Ref Range   Heparin Unfractionated 0.45 0.30 - 0.70 IU/mL  Hepatitis B surface antigen  Result Value Ref Range   Hepatitis B Surface Ag NON REACTIVE NON REACTIVE  Hepatitis B surface antibody  Result Value Ref Range   Hep B S Ab Reactive (A) NON REACTIVE  Hepatitis B surface antibody,quantitative  Result Value Ref Range   Hepatitis B-Post 72.7 Immunity>9.9 mIU/mL  CBC  Result Value Ref Range   WBC 5.1 4.0 - 10.5 K/uL   RBC 4.16 (L) 4.22 - 5.81 MIL/uL   Hemoglobin 11.8 (L) 13.0 - 17.0 g/dL   HCT 37.4 (L) 39.0 - 52.0 %   MCV 89.9 80.0 - 100.0 fL   MCH 28.4 26.0 - 34.0 pg   MCHC 31.6  30.0 - 36.0 g/dL   RDW 16.4 (H) 11.5 - 15.5 %   Platelets 172 150 - 400 K/uL   nRBC 0.0 0.0 - 0.2 %  Phosphorus  Result Value Ref Range   Phosphorus 5.3 (H) 2.5 - 4.6 mg/dL  CBC  Result Value Ref Range   WBC 5.2 4.0 - 10.5 K/uL   RBC 3.57 (L) 4.22 - 5.81 MIL/uL   Hemoglobin 10.0 (L) 13.0 - 17.0 g/dL   HCT 32.1 (L) 39.0 - 52.0 %   MCV 89.9 80.0 - 100.0 fL   MCH 28.0 26.0 - 34.0 pg   MCHC 31.2 30.0 - 36.0 g/dL   RDW 16.4 (H) 11.5 - 15.5 %   Platelets 149 (L) 150 - 400 K/uL   nRBC 0.0 0.0 - 0.2 %  CBC  Result Value Ref Range   WBC 3.5 (L) 4.0 - 10.5 K/uL   RBC 4.31 4.22 - 5.81 MIL/uL   Hemoglobin 12.2 (L) 13.0 - 17.0 g/dL   HCT 39.6 39.0 - 52.0 %   MCV 91.9 80.0 - 100.0 fL   MCH 28.3 26.0 - 34.0 pg   MCHC 30.8 30.0 - 36.0 g/dL   RDW 17.1 (H) 11.5 - 15.5 %   Platelets 144 (L) 150 - 400 K/uL   nRBC 0.0 0.0 - 0.2 %  CBC  Result Value Ref Range   WBC 4.9 4.0 - 10.5 K/uL   RBC 3.25 (L) 4.22 - 5.81 MIL/uL   Hemoglobin 9.2 (L) 13.0 - 17.0 g/dL   HCT 29.0 (L) 39.0 - 52.0 %   MCV 89.2 80.0 - 100.0 fL   MCH 28.3 26.0 - 34.0 pg   MCHC 31.7 30.0 - 36.0 g/dL   RDW 16.3 (H) 11.5 - 15.5  %   Platelets 140 (L) 150 - 400 K/uL   nRBC 0.0 0.0 - 0.2 %  Renal function panel  Result Value Ref Range   Sodium 130 (L) 135 - 145 mmol/L   Potassium 3.9 3.5 - 5.1 mmol/L   Chloride 97 (L) 98 - 111 mmol/L   CO2 21 (L) 22 - 32 mmol/L   Glucose, Bld 160 (H) 70 - 99 mg/dL   BUN 129 (H) 8 - 23 mg/dL   Creatinine, Ser 7.87 (H) 0.61 - 1.24 mg/dL   Calcium 7.6 (L) 8.9 - 10.3 mg/dL   Phosphorus 2.6 2.5 - 4.6 mg/dL   Albumin 2.6 (L) 3.5 - 5.0 g/dL   GFR, Estimated 7 (L) >60 mL/min   Anion gap 12 5 - 15  ECHOCARDIOGRAM COMPLETE  Result Value Ref Range   Weight 2,730.18 oz   Height 65 in   BP 123/72 mmHg   S' Lateral 3.10 cm   AR max vel 2.37 cm2   AV Area VTI 2.48 cm2   AV Mean grad 25.0 mmHg   AV Peak grad 43.2 mmHg   Ao pk vel 3.29 m/s   Area-P 1/2 3.12 cm2   AV Area mean vel 2.40 cm2  Troponin I (High Sensitivity)  Result Value Ref Range   Troponin I (High Sensitivity) 540 (HH) <18 ng/L  Troponin I (High Sensitivity)  Result Value Ref Range   Troponin I (High Sensitivity) 546 (HH) <18 ng/L  Troponin I (High Sensitivity)  Result Value Ref Range   Troponin I (High Sensitivity) 452 (HH) <18 ng/L  Troponin I (High Sensitivity)  Result Value Ref Range   Troponin I (High Sensitivity) 427 (HH) <18 ng/L  Pertinent labs & imaging results that were available during my care of the patient were reviewed by me and considered in my medical decision making.  Assessment & Plan:  Kerry Underwood was seen today for hospitalization follow-up.  Diagnoses and all orders for this visit:  COPD with acute exacerbation ([place]) Acute respiratory failure with hypoxia [facility]) Has follow-up with pulmonology scheduled.  Patient instructed on how to use daily inhalers as prescribed.  Instructed on importance of inhaler use.  Demand ischemia ([place]) Chronic diastolic heart failure ([place]) Has follow-up with cardiology scheduled.  No indications of fluid overload in office today.  Influenza A Doing well  since discharge home from hospital.  ESRD on hemodialysis [facility]) Followed by nephrology on a regular basis, has dialysis TTS.  Has been compliant with dialysis appointments since discharge from hospital.    Continue all other maintenance medications.  Follow up plan: Return in 3 months (on 03/01/2022), or if symptoms worsen or fail to improve, for with PCP.   Continue healthy lifestyle choices, including diet (rich in fruits, vegetables, and lean proteins, and low in salt and simple carbohydrates) and exercise (at least 30 minutes of moderate physical activity daily).  Educational handout given for COPD  The above assessment and management plan was discussed with the patient. The patient verbalized understanding of and has agreed to the management plan. Patient is aware to call the clinic if they develop any new symptoms or if symptoms persist or worsen. Patient is aware when to return to the clinic for a follow-up visit. Patient educated on when it is appropriate to go to the emergency department.   Monia Pouch, FNP-C [facility] [phone]

## 2021-12-06 ENCOUNTER — Inpatient Hospital Stay: Payer: 59 | Admitting: Internal Medicine

## 2021-12-06 ENCOUNTER — Ambulatory Visit: Payer: 59

## 2021-12-20 ENCOUNTER — Encounter: Payer: Self-pay | Admitting: Student

## 2021-12-20 ENCOUNTER — Ambulatory Visit (INDEPENDENT_AMBULATORY_CARE_PROVIDER_SITE_OTHER): Payer: 59 | Admitting: Student

## 2021-12-20 ENCOUNTER — Other Ambulatory Visit: Payer: Self-pay

## 2021-12-20 VITALS — BP 140/62 | HR 94 | Ht 65.0 in | Wt 184.0 lb

## 2021-12-20 DIAGNOSIS — I739 Peripheral vascular disease, unspecified: Secondary | ICD-10-CM

## 2021-12-20 DIAGNOSIS — I350 Nonrheumatic aortic (valve) stenosis: Secondary | ICD-10-CM

## 2021-12-20 DIAGNOSIS — R778 Other specified abnormalities of plasma proteins: Secondary | ICD-10-CM

## 2021-12-20 DIAGNOSIS — E785 Hyperlipidemia, unspecified: Secondary | ICD-10-CM

## 2021-12-20 DIAGNOSIS — I10 Essential (primary) hypertension: Secondary | ICD-10-CM | POA: Diagnosis not present

## 2021-12-20 DIAGNOSIS — J449 Chronic obstructive pulmonary disease, unspecified: Secondary | ICD-10-CM

## 2021-12-20 MED ORDER — METOPROLOL SUCCINATE ER 25 MG PO TB24: 25.0000 mg | ORAL_TABLET | Freq: Every day | ORAL | 3 refills | Status: AC

## 2021-12-20 NOTE — Progress Notes (Signed)
Cardiology Office Note    Date:  12/21/2021   ID:  Kerry Underwood, DOB Jun 24, 1951, MRN [id number]  PCP:  Kerry Brooklyn, FNP  Cardiologist: Kerry Bergeron, MD    Chief Complaint  Patient presents with   Hospitalization Follow-up    History of Present Illness:    Kerry Underwood is a 71 y.o. male with past medical history of PAD (s/p right femoral endarterectomy and fem-pop bypass in 09/2021) HTN, COPD, ESRD and tobacco use who presents to the office today for hospital follow-up.  He was most recently admitted to [facility] in 11/2021 for acute hypoxic respiratory failure as he had been evaluated by his PCP and hypoxic at 60% on room air. He was found to be positive for influenza A and also to have a COPD exacerbation. Cardiology was consulted as his Hs Troponin values peaked at 546 but EKG was without acute ST changes.  His enzyme elevation was felt to be secondary to demand ischemia in the setting of his acute illness. A follow-up echocardiogram was obtained and showed a preserved EF of 60 to 65% with no regional wall motion abnormalities. He did have grade 2 diastolic dysfunction, normal RV function, mildly elevated PASP of 39 mmHg, mild MR, mild MS, and moderate aortic valve stenosis. He was continued on ASA 81 mg daily, Plavix 75 mg daily and Lipitor 40 mg daily with Lopressor being transitioned to Toprol-XL 25mg  daily.   In talking with the patient today, he reports his breathing has significantly improved since his hospitalization. Not having to use supplemental oxygen. He is trying to use his inhalers more frequently. He denies any recent chest pain or palpitations. Does not exercise but says he stays active at his home by carrying wood for his wood stove and riding horses. No recent orthopnea, PND or pitting edema. Says his weight has been stable at HD.   Past Medical History:  Diagnosis Date   Anemia    CHF (congestive heart failure) (HCC)    COPD (chronic obstructive pulmonary  disease) ([place])    Full dentures    Hypertension    Pneumonia    Pyelonephritis 09/11/2017   Renal disorder    Stroke [facility])    " A few mini strokes, I didn't know it "    Past Surgical History:  Procedure Laterality Date   ABDOMINAL AORTOGRAM W/LOWER EXTREMITY Bilateral 09/20/2021   Procedure: ABDOMINAL AORTOGRAM W/LOWER EXTREMITY;  Surgeon: Broadus John, MD;  Location: [place] [facility];  Service: Cardiovascular;  Laterality: Bilateral;   AMPUTATION TOE Right 09/22/2021   Procedure: PARTIAL AMPUTATION SECOND RIGHT TOE;  Surgeon: Marty Heck, MD;  Location: [place];  Service: Vascular;  Laterality: Right;   [place] Right 04/19/2020   Procedure: Basilic Vein Transposition Right Arm;  Surgeon: Waynetta Sandy, MD;  Location: [place];  Service: Vascular;  Laterality: Right;   COLON SURGERY     ENDARTERECTOMY FEMORAL Right 09/22/2021   Procedure: ENDARTERECTOMY FEMORAL WITH PATCH ANGIOPLASTY USING 1X6CM Weyman Pedro;  Surgeon: Marty Heck, MD;  Location: [facility];  Service: Vascular;  Laterality: Right;   FEMORAL-TIBIAL BYPASS GRAFT Right 09/22/2021   Procedure: RIGHT FEMORAL TO BELOW KNEE POPLITEAL BYPASS WITH HARVEST OF GREATER SAPHENOUS VEIN;  Surgeon: Marty Heck, MD;  Location: [place];  Service: Vascular;  Laterality: Right;   INSERTION OF DIALYSIS CATHETER Right 04/19/2020   Procedure: Insertion Of Right Internal Jugular Dialysis Catheter;  Surgeon: Waynetta Sandy, MD;  Location: [facility];  Service: Vascular;  Laterality: Right;   MULTIPLE TOOTH EXTRACTIONS     PERIPHERAL VASCULAR BALLOON ANGIOPLASTY Right 09/20/2021   Procedure: PERIPHERAL VASCULAR BALLOON ANGIOPLASTY;  Surgeon: Broadus John, MD;  Location: [place] [facility];  Service: Cardiovascular;  Laterality: Right;  SFA  FAILED    Current Medications: Outpatient Medications Prior to Visit  Medication Sig Dispense Refill   albuterol (VENTOLIN HFA) 108 (90  Base) MCG/ACT inhaler Inhale 2 puffs into the lungs every 4 (four) hours as needed for wheezing or shortness of breath. 18 g 0   aspirin EC 81 MG tablet Take 1 tablet (81 mg total) by mouth daily. 30 tablet 1   atorvastatin (LIPITOR) 40 MG tablet Take 1 tablet (40 mg total) by mouth daily. 30 tablet 1   Cholecalciferol (VITAMIN D3 PO) Take by mouth daily.     clopidogrel (PLAVIX) 75 MG tablet Take 1 tablet (75 mg total) by mouth daily with breakfast. 30 tablet 1   fluticasone furoate-vilanterol (BREO ELLIPTA) 200-25 MCG/INH AEPB Inhale 1 puff into the lungs daily. 30 each 1   heparin 1000 unit/mL SOLN injection Heparin Sodium (Porcine) 1,000 Units/mL Catheter Lock Arterial     Methoxy PEG-Epoetin Beta (MIRCERA IJ) Mircera     sucroferric oxyhydroxide (VELPHORO) 500 MG chewable tablet Chew 1,000 mg by mouth 3 (three) times daily with meals.     tamsulosin (FLOMAX) 0.4 MG CAPS capsule Take 0.4 mg by mouth at bedtime.     tiotropium (SPIRIVA HANDIHALER) 18 MCG inhalation capsule Place 1 capsule (18 mcg total) into inhaler and inhale daily. 30 capsule 1   aspirin 325 MG tablet Take 325 mg by mouth daily.     calcitRIOL (ROCALTROL) 0.25 MCG capsule Take 1 capsule (0.25 mcg total) by mouth Every Tuesday,Thursday,and Saturday with dialysis. (Patient not taking: Reported on 11/24/2021) 90 capsule 0   predniSONE (DELTASONE) 10 MG tablet Take 3 tabs daily x3 days, then 2 tabs daily x3 days, then 1 tab daily x3 days, then stop. (Patient not taking: Reported on 12/20/2021) 18 tablet 0   No facility-administered medications prior to visit.     Allergies:   Strawberry (diagnostic) and Strawberry extract   Social History   Socioeconomic History   Marital status: Single    Spouse name: Not on file   Number of children: Not on file   Years of education: Not on file   Highest education level: Not on file  Occupational History   Occupation: retired  Tobacco Use   Smoking status: Every Day    Packs/day:  0.50    Years: 62.00    Pack years: 31.00    Types: Cigarettes   Smokeless tobacco: Former    Types: Chew   Tobacco comments:    Started smoking at age 11 - rolls his own tobacco cigarettes   Vaping Use   Vaping Use: Never used  Substance and Sexual Activity   Alcohol use: No   Drug use: Yes    Types: Marijuana   Sexual activity: Not Currently    Birth control/protection: None  Other Topics Concern   Not on file  Social History Narrative   Lives alone. No family nearby. He does get out and socialize with friends.    Doesn't have  a good relationship with his children   Social Determinants of Health   Financial Resource Strain: Medium Risk   Difficulty of Paying Living Expenses: Somewhat hard  Food Insecurity: No Food Insecurity  Worried About Charity fundraiser in the Last Year: Never true   [place] in the Last Year: Never true  Transportation Needs: No Transportation Needs   Lack of Transportation (Medical): No   Lack of Transportation (Non-Medical): No  Physical Activity: Sufficiently Active   Days of Exercise per Week: 5 days   Minutes of Exercise per Session: 30 min  Stress: No Stress Concern Present   Feeling of Stress : Not at all  Social Connections: Socially Isolated   Frequency of Communication with Friends and Family: More than three times a week   Frequency of Social Gatherings with Friends and Family: More than three times a week   Attends Religious Services: Never   Marine scientist or Organizations: No   Attends Archivist Meetings: Never   Marital Status: Never married     Family History:  The patient's family history includes Alzheimer's disease in his mother.   Review of Systems:    Please see the history of present illness.     All other systems reviewed and are otherwise negative except as noted above.   Physical Exam:    VS:  BP 140/62    Pulse 94    Ht 5\' 5"  (1.651 m)    Wt 184 lb (83.5 kg)    SpO2 97%    BMI  30.62 kg/m    General: Well developed, well nourished,male appearing in no acute distress. Head: Normocephalic, atraumatic. Neck: No carotid bruits. JVD not elevated.  Lungs: Respirations regular and unlabored, without wheezes or rales.  Heart: Regular rate and rhythm. 2/6 SEM along RUSB.  Abdomen: Appears non-distended. No obvious abdominal masses. Msk:  Strength and tone appear normal for age. No obvious joint deformities or effusions. Extremities: No clubbing or cyanosis. No pitting edema.  Distal pedal pulses are 2+ bilaterally. Neuro: Alert and oriented X 3. Moves all extremities spontaneously. No focal deficits noted. Psych:  Responds to questions appropriately with a normal affect. Skin: No rashes or lesions noted  Wt Readings from Last 3 Encounters:  12/20/21 184 lb (83.5 kg)  12/01/21 174 lb 4.8 oz (79.1 kg)  11/28/21 166 lb 7.2 oz (75.5 kg)     Studies/Labs Reviewed:   EKG:  EKG is not ordered today.   Recent Labs: 06/29/2021: B Natriuretic Peptide 287.0; Magnesium 2.0 08/23/2021: TSH 2.990 09/18/2021: ALT 18 11/28/2021: BUN 129; Creatinine, Ser 7.87; Hemoglobin 9.2; Platelets 140; Potassium 3.9; Sodium 130   Lipid Panel    Component Value Date/Time   CHOL 91 09/23/2021 0427   CHOL 146 08/23/2021 1208   TRIG 91 09/23/2021 0427   HDL 19 (L) 09/23/2021 0427   HDL 30 (L) 08/23/2021 1208   CHOLHDL 4.8 09/23/2021 0427   VLDL 18 09/23/2021 0427   LDLCALC 54 09/23/2021 0427   LDLCALC 90 08/23/2021 1208    Additional studies/ records that were reviewed today include:   Echocardiogram: 11/25/2021 IMPRESSIONS     1. Left ventricular ejection fraction, by estimation, is 60 to 65%. The  left ventricle has normal function. The left ventricle has no regional  wall motion abnormalities. There is mild left ventricular hypertrophy.  Left ventricular diastolic parameters  are consistent with Grade II diastolic dysfunction (pseudonormalization).   2. Right ventricular  systolic function is normal. The right ventricular  size is normal. There is mildly elevated pulmonary artery systolic  pressure. The estimated right ventricular systolic pressure is 96.2 mmHg.   3. Left atrial size  was mildly dilated.   4. The mitral valve is degenerative. Mild mitral valve regurgitation.  Mild calcific mitral stenosis. Mean gradient 8mmHg at HR 71; MVA 1.9cm2 by  continuity. Severe mitral annular calcification.   5. The aortic valve is calcified. There is moderate-to-severe  calcification of the aortic valve. There is moderate-to-severe thickening  of the aortic valve. Aortic valve regurgitation is not visualized.  Moderate aortic valve stenosis. AVA 0.91cm2, mean  gradient 35mmHg, peak gradient 53mmHg, Vmax 3.51m/s, DI 0.3.   6. The inferior vena cava is dilated in size with >50% respiratory  variability, suggesting right atrial pressure of 8 mmHg.   Comparison(s): No prior Echocardiogram.   Assessment:    1. Elevated troponin   2. Nonrheumatic aortic valve stenosis   3. Essential hypertension   4. PAD (peripheral artery disease) ([place])   5. Hyperlipidemia LDL goal <70   6. Chronic obstructive pulmonary disease, unspecified COPD type ([place])      Plan:   In order of problems listed above:  1. Elevated Troponin Values - Documented during his recent admission and felt to be secondary to demand ischemia in the setting of Influenza A and his COPD exacerbation.  - He reports being active at baseline and denies any recent anginal symptoms. We discussed ischemic evaluation as recommended during his admission including options such as a Lexiscan Myoview or Coronary CT. At this time, he declines further testing. I encouraged him to make [place] aware if he changes his mind or develops any new symptoms in the interim as he has multiple cardiac risk factors.   - Continue ASA (taking 81mg  daily and 325mg  daily and I recommended only taking 81mg  daily given the concurrent use of  Plavix), Plavix 75mg  daily and Atorvastatin 40mg  daily. He says he ran out of Toprol-XL 25mg  daily and a new Rx was provided.   2. Aortic Stenosis - Recent echocardiogram showed moderate AS. We reviewed this in detail today as he was unaware he had a heart murmur. Would anticipate repeat imaging in 6-12 months.   3. HTN - His BP is at 140/62 during today's visit. He is illiterate and goes by what his medications look like to determine when to take them. We reviewed options including a pill pack from his pharmacy but he declines. He has been out of Toprol-XL and I did recommend resuming this. I encouraged him to take his medications to his HD session if he is unsure what he is taking to make sure he is not taking duplicates but did not seem inclined to do this either.   4.  PAD - He is s/p right femoral endarterectomy and fem-pop bypass in 09/2021. Reports improvement in his claudication since surgery.  - Continue ASA, Plavix and statin therapy.   5. HLD - LDL was at 54 in 09/2021. Continue Atorvastatin 40mg  daily.   6. COPD/Tobacco Use - He is using his maintenance inhalers more frequently and this has helped with his dyspnea. Still smoking 10+ cigarettes a day and no plans to stop at this time.    Medication Adjustments/Labs and Tests Ordered: Current medicines are reviewed at length with the patient today.  Concerns regarding medicines are outlined above.  Medication changes, Labs and Tests ordered today are listed in the Patient Instructions below. Patient Instructions  Medication Instructions:   Restart Toprol XL 25 mg Daily  Stop taking Aspirin 325 mg   *If you need a refill on your cardiac medications before your next appointment, please call  your pharmacy*   Lab Work: NONE   If you have labs (blood work) drawn today and your tests are completely normal, you will receive your results only by: [place] (if you have MyChart) OR A paper copy in the mail If you have any  lab test that is abnormal or we need to change your treatment, we will call you to review the results.   Testing/Procedures: NONE    Follow-Up: At [facility], you and your health needs are our priority.  As part of our continuing mission to provide you with exceptional heart care, we have created designated Provider Care Teams.  These Care Teams include your primary Cardiologist (physician) and Advanced Practice Providers (APPs -  Physician Assistants and Nurse Practitioners) who all work together to provide you with the care you need, when you need it.  We recommend signing up for the patient portal called "MyChart".  Sign up information is provided on this After Visit Summary.  MyChart is used to connect with patients for Virtual Visits (Telemedicine).  Patients are able to view lab/test results, encounter notes, upcoming appointments, etc.  Non-urgent messages can be sent to your provider as well.   To learn more about what you can do with MyChart, go to [email or web address].    Your next appointment:   6 month(s)  The format for your next appointment:   In Person  Provider:   Freada Bergeron, MD     Other Instructions Thank you for choosing [facility]!      Signed, Erma Heritage, PA-C  12/21/2021 9:18 AM    [facility] [address] [place], [place] [address] Phone: [phone] Fax: [phone]

## 2021-12-20 NOTE — Patient Instructions (Signed)
Medication Instructions:   Restart Toprol XL 25 mg Daily  Stop taking Aspirin 325 mg   *If you need a refill on your cardiac medications before your next appointment, please call your pharmacy*   Lab Work: NONE   If you have labs (blood work) drawn today and your tests are completely normal, you will receive your results only by: [place] (if you have MyChart) OR A paper copy in the mail If you have any lab test that is abnormal or we need to change your treatment, we will call you to review the results.   Testing/Procedures: NONE    Follow-Up: At [facility], you and your health needs are our priority.  As part of our continuing mission to provide you with exceptional heart care, we have created designated Provider Care Teams.  These Care Teams include your primary Cardiologist (physician) and Advanced Practice Providers (APPs -  Physician Assistants and Nurse Practitioners) who all work together to provide you with the care you need, when you need it.  We recommend signing up for the patient portal called "MyChart".  Sign up information is provided on this After Visit Summary.  MyChart is used to connect with patients for Virtual Visits (Telemedicine).  Patients are able to view lab/test results, encounter notes, upcoming appointments, etc.  Non-urgent messages can be sent to your provider as well.   To learn more about what you can do with MyChart, go to [email or web address].    Your next appointment:   6 month(s)  The format for your next appointment:   In Person  Provider:   Freada Bergeron, MD     Other Instructions Thank you for choosing [facility]!

## 2021-12-21 ENCOUNTER — Encounter: Payer: Self-pay | Admitting: Student

## 2021-12-28 ENCOUNTER — Telehealth: Payer: Self-pay

## 2021-12-28 NOTE — Telephone Encounter (Signed)
° °  Telephone encounter was:  Unsuccessful.  12/28/2021 Name: Longino Trefz MRN: [id number] DOB: 02-Nov-1951  Unsuccessful outbound call made today to assist with:  Financial Difficulties related to Utilities.  Outreach Attempt:  2nd Attempt  Unable to Leave MessageCommunicated - Called pt twice and calls just go to voicemail but there are no voicemail set up.   [facility] [employer]  Marty, [place]  Main Phone: [phone]   E-mail: [email or web address]  Website: [email or web address]

## 2022-01-11 ENCOUNTER — Inpatient Hospital Stay: Payer: 59 | Admitting: Internal Medicine

## 2022-01-11 NOTE — Progress Notes (Incomplete)
Kerry Underwood, male    DOB: March 18, 1951,  MRN: [id number]   Brief patient profile:  ***  yo*** *** referred to pulmonary clinic in [place]  01/11/2022 by *** for ***   Admit date: 11/24/2021 Discharge date: 11/28/2021   Discharge Diagnoses:  Principal Problem:   Acute respiratory failure with hypoxia (Kerry Underwood) Active Problems:   Hypertension secondary to other renal disorders   ESRD (end stage renal disease) ([place])   COPD with acute exacerbation (HCC)   Influenza A Infection   Chronic diastolic heart failure (HCC)   Chronic viral hepatitis C ([place])   Demand ischemia ([place])     Brief Summary: 71 year old male, lives alone, independent, medical history significant for chronic diastolic CHF (2D echo 0/08/3266 in [place] with LVEF 55-60%), COPD, ongoing tobacco use, hypertension, ESRD on TTS HD, anemia in ESRD, PAD with intervention, stroke, presented initially to [facility] ED on 11/24/2021 with complaints of progressive dyspnea, cough, fever for which his home inhalers were not helping.  Seen by his PCP and noted to be hypoxic to 60% on room air.  Admitted for influenza A acute bronchitis and NSTEMI.  Case discussed with cardiologist at [facility] and as per recommendations transferred to [place] for further evaluation by them and IV heparin initiated.  Cardiology have determined that troponin elevation is more from demand ischemia, echo unremarkable, plan outpatient work-up, IV heparin discontinued.  Continued to have issues with COPD and wheezing.  Suspect he has very advanced COPD given history of 62 years of smoking.  Pulmonology consulted for assistance.     Assessment & Plan:    NSTEMI type II, troponin elevation due to demand ischemia: In the setting of influenza A acute bronchitis and COPD exacerbation.  Denies prior history of MI or CAD.  No chest pain.  HS Troponin cycled and peaked at 546 and trending down (540 > 546 > 452 > 427).  Started on IV heparin.  2D echo: LVEF 60-65%, no  regional wall motion abnormalities and grade 2 diastolic dysfunction moderate aortic valve stenosis.  Continue atorvastatin, aspirin, Plavix and metoprolol tartrate changed to succinate.   Cardiology have determined that troponin elevation is more from demand ischemia, echo unremarkable, plan outpatient work-up, IV heparin discontinued.  Cardiology have signed off and will arrange outpatient follow-up for further evaluation.  Metoprolol was discontinued by nephrology due to BPs down with volume management across HD.  Influenza A acute bronchitis with COPD exacerbation: Tobacco cessation counseled.  Treated with IV>p.o. steroids, Tamiflu (completed course), bronchodilator nebulization scheduled and as needed, Breo Ellipta and Incruse Ellipta and added flutter valve.  Chest x-ray 12/18 personally reviewed and without acute findings.  Added doxycycline for possible superadded bacterial infection but patient does not want to take due to side effects with antibiotics that cause him to have profuse diarrhea and has refused even today.  Pulmonology consultation appreciated: Very likely has COPD, noncompliant with outpatient therapies for same, they had a lengthy conversation with the patient explaining the importance of compliance with his medical regimen, outpatient follow-up with pulmonology which they will arrange in the [place] office, quitting smoking (patient declines) and getting lung cancer screening.  Will need outpatient PFTs.  Although they had recommended Trelegy at discharge, I just discussed with their team and they have advised to continue prior home meds including albuterol inhaler as needed, Breo Ellipta and Spiriva and further adjustments can be made as outpatient.  Tobacco abuse: Cessation counseled.  Patient not interested in quitting.  Reports  that he has been smoking for 62 years.  Acute respiratory failure with hypoxia: Oxygen saturations in the 60s at PCPs office.  Evaluated for home oxygen  needs today and patient does not qualify.  Saturating at 95% on room air at rest and 93% on room air while ambulating.  Essential hypertension: Patient was on amlodipine and metoprolol which have been discontinued by nephrology due to control after volume management across HD.  Hyperlipidemia: Continue statins  ESRD on TTS HD: Follows with Dr. Marval Regal, [facility].  Mild hyperkalemia resolved.  Underwent HD on 12/27 and 12/20.  Continue outpatient HD and nephrology follow-up.  Has hyponatremia which is mild and appears chronic and intermittent.   Anemia in ESRD: Last hemoglobin in system was in the 8 g range.  Presented with hemoglobin of 14.6 which was likely erroneously high.  Hemoglobin has gradually dropped to 9.2 today.  However on looking at his prior numbers in October, his baseline was probably in the 8-9 range.  So this is likely his baseline.  No bleeding reported.  Close outpatient follow-up at dialysis with labs will be drawn.  He also has mild thrombocytopenia which appears chronic and intermittent.  Stable.  Chronic diastolic CHF: Volume management across HD.  Clinically euvolemic and chest x-ray 12/18 without pulmonary edema.   Moderate Aortic Stenosis: OP follow up with serial Echo's  GERD: Patient does not take PPI at home.   PAD: Follows with Dr. Virl Cagey and had interventions on his right lower extremity in October 2022.  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  Continue Plavix and statins.  Tobacco cessation was counseled.    History of Present Illness  01/11/2022  Pulmonary/ 1st office eval/ Kerry Underwood / [place] [facility]  No chief complaint on file.    Dyspnea:  *** Cough: *** Sleep: *** SABA use:   Past Medical History:  Diagnosis Date   Anemia    CHF (congestive heart failure) (HCC)    COPD (chronic obstructive pulmonary disease) ([place])    Full dentures    Hypertension    Pneumonia     Pyelonephritis 09/11/2017   Renal disorder    Stroke [facility]    " A few mini strokes, I didn't know it "    Outpatient Medications Prior to Visit  Medication Sig Dispense Refill   albuterol (VENTOLIN HFA) 108 (90 Base) MCG/ACT inhaler Inhale 2 puffs into the lungs every 4 (four) hours as needed for wheezing or shortness of breath. 18 g 0   aspirin EC 81 MG tablet Take 1 tablet (81 mg total) by mouth daily. 30 tablet 1   atorvastatin (LIPITOR) 40 MG tablet Take 1 tablet (40 mg total) by mouth daily. 30 tablet 1   calcitRIOL (ROCALTROL) 0.25 MCG capsule Take 1 capsule (0.25 mcg total) by mouth Every Tuesday,Thursday,and Saturday with dialysis. (Patient not taking: Reported on 11/24/2021) 90 capsule 0   Cholecalciferol (VITAMIN D3 PO) Take by mouth daily.     clopidogrel (PLAVIX) 75 MG tablet Take 1 tablet (75 mg total) by mouth daily with breakfast. 30 tablet 1   fluticasone furoate-vilanterol (BREO ELLIPTA) 200-25 MCG/INH AEPB Inhale 1 puff into the lungs daily. 30 each 1   heparin 1000 unit/mL SOLN injection Heparin Sodium (Porcine) 1,000 Units/mL Catheter Lock Arterial     Methoxy PEG-Epoetin Beta (MIRCERA IJ) Mircera     metoprolol succinate (TOPROL XL) 25 MG 24 hr tablet Take 1 tablet (25 mg total) by  mouth daily. 90 tablet 3   sucroferric oxyhydroxide (VELPHORO) 500 MG chewable tablet Chew 1,000 mg by mouth 3 (three) times daily with meals.     tamsulosin (FLOMAX) 0.4 MG CAPS capsule Take 0.4 mg by mouth at bedtime.     tiotropium (SPIRIVA HANDIHALER) 18 MCG inhalation capsule Place 1 capsule (18 mcg total) into inhaler and inhale daily. 30 capsule 1   No facility-administered medications prior to visit.     Objective:     There were no vitals taken for this visit.         Assessment   No problem-specific Assessment & Plan notes found for this encounter.     Christinia Gully, MD 01/11/2022

## 2022-01-12 ENCOUNTER — Inpatient Hospital Stay: Payer: 59 | Admitting: Internal Medicine

## 2022-01-15 ENCOUNTER — Other Ambulatory Visit: Payer: Self-pay

## 2022-01-15 DIAGNOSIS — N186 End stage renal disease: Secondary | ICD-10-CM

## 2022-01-17 ENCOUNTER — Encounter (HOSPITAL_COMMUNITY): Payer: 59

## 2022-01-17 ENCOUNTER — Ambulatory Visit: Payer: 59

## 2022-01-29 ENCOUNTER — Telehealth: Payer: Self-pay

## 2022-01-29 NOTE — Telephone Encounter (Signed)
° °  Telephone encounter was:  Successful.  01/29/2022 Name: Kerry Underwood MRN: [id number] DOB: 01/27/1951  Kerry Underwood is a 71 y.o. year old male who is a primary care patient of [name], FNP . The community resource team was consulted for assistance with Transportation Needs , Food Insecurity, Home Modifications, and Financial Difficulties related to utilities  Care guide performed the following interventions:  Pt and Niece advised they need all the help they can get and we went over everything that they have encounter, in conclusion this led me to send over all necessary information. Niece requested infomation via e-mail: food stamps application, cell phone assistance, Designer, industrial/product program, cone finanical assistance program, meals on wheels, [employer], [place] for utility assistance, Single Family Housing Repair Loans and Grants, and Family Caregivers information .  Follow Up Plan:  No further follow up planned at this time. The patient has been provided with needed resources. Niece will review information and call or e-mail me back if she has any further questions or concerns.  [place] management  [place], [place]  Main Phone: [phone]   E-mail: [email or web address]  Website: [email or web address]

## 2022-01-31 ENCOUNTER — Encounter: Payer: Self-pay | Admitting: Vascular Surgery

## 2022-01-31 ENCOUNTER — Ambulatory Visit (INDEPENDENT_AMBULATORY_CARE_PROVIDER_SITE_OTHER): Payer: 59 | Admitting: Vascular Surgery

## 2022-01-31 ENCOUNTER — Other Ambulatory Visit: Payer: Self-pay

## 2022-01-31 VITALS — BP 130/73 | HR 81 | Temp 100.2°F | Resp 20 | Ht 65.0 in | Wt 186.1 lb

## 2022-01-31 DIAGNOSIS — N186 End stage renal disease: Secondary | ICD-10-CM | POA: Diagnosis not present

## 2022-01-31 DIAGNOSIS — Z992 Dependence on renal dialysis: Secondary | ICD-10-CM | POA: Diagnosis not present

## 2022-01-31 NOTE — H&P (View-Only) (Signed)
Vascular and Vein Specialist of [place]  Patient name: Kerry Underwood MRN: [id number] DOB: [birth year] Sex: male  REASON FOR VISIT: Discuss second stage basilic vein transposition  HPI: Kerry Underwood is a 71 y.o. male with end-stage renal disease.  He currently has dialysis via a right IJ catheter.  He has a rather interesting story.  He had catheter insertion and for stage right basilic vein transposition with Dr.Cain in May 2021.  He has not had a second stage.  He reports that he has to split wood for heat and was not willing to proceed with his second stage surgery.  He then reports that time got away from him.  He has had exchange of his catheter on 1 occasion for poor functioning.  Has had no history of infection of his catheter.  He currently undergoes dialysis at [facility] on Tuesday, Thursday and Saturday  Past Medical History:  Diagnosis Date   Anemia    CHF (congestive heart failure) (HCC)    COPD (chronic obstructive pulmonary disease) (HCC)    Full dentures    Hypertension    Pneumonia    Pyelonephritis 09/11/2017   Renal disorder    Stroke [facility])    " A few mini strokes, I didn't know it "    Family History  Problem Relation Age of Onset   Alzheimer's disease Mother     SOCIAL HISTORY: Social History   Tobacco Use   Smoking status: Every Day    Packs/day: 0.50    Years: 62.00    Pack years: 31.00    Types: Cigarettes   Smokeless tobacco: Former    Types: Chew   Tobacco comments:    Started smoking at age 18 - rolls his own tobacco cigarettes   Substance Use Topics   Alcohol use: No    Allergies  Allergen Reactions   Strawberry (Diagnostic)    Strawberry Extract     Other reaction(s): Unknown    Current Outpatient Medications  Medication Sig Dispense Refill   albuterol (VENTOLIN HFA) 108 (90 Base) MCG/ACT inhaler Inhale 2 puffs into the lungs every 4 (four) hours as needed for wheezing or shortness of  breath. 18 g 0   aspirin EC 81 MG tablet Take 1 tablet (81 mg total) by mouth daily. 30 tablet 1   atorvastatin (LIPITOR) 40 MG tablet Take 1 tablet (40 mg total) by mouth daily. 30 tablet 1   calcitRIOL (ROCALTROL) 0.25 MCG capsule Take 1 capsule (0.25 mcg total) by mouth Every Tuesday,Thursday,and Saturday with dialysis. (Patient not taking: Reported on 11/24/2021) 90 capsule 0   Cholecalciferol (VITAMIN D3 PO) Take by mouth daily.     clopidogrel (PLAVIX) 75 MG tablet Take 1 tablet (75 mg total) by mouth daily with breakfast. 30 tablet 1   fluticasone furoate-vilanterol (BREO ELLIPTA) 200-25 MCG/INH AEPB Inhale 1 puff into the lungs daily. 30 each 1   heparin 1000 unit/mL SOLN injection Heparin Sodium (Porcine) 1,000 Units/mL Catheter Lock Arterial     Methoxy PEG-Epoetin Beta (MIRCERA IJ) Mircera     metoprolol succinate (TOPROL XL) 25 MG 24 hr tablet Take 1 tablet (25 mg total) by mouth daily. 90 tablet 3   sucroferric oxyhydroxide (VELPHORO) 500 MG chewable tablet Chew 1,000 mg by mouth 3 (three) times daily with meals.     tamsulosin (FLOMAX) 0.4 MG CAPS capsule Take 0.4 mg by mouth at bedtime.     tiotropium (SPIRIVA HANDIHALER) 18 MCG inhalation capsule Place 1 capsule (18  mcg total) into inhaler and inhale daily. 30 capsule 1   No current facility-administered medications for this visit.    REVIEW OF SYSTEMS:  [X]  denotes positive finding, [ ]  denotes negative finding Cardiac  Comments:  Chest pain or chest pressure:    Shortness of breath upon exertion:    Short of breath when lying flat:    Irregular heart rhythm:        Vascular    Pain in calf, thigh, or hip brought on by ambulation:    Pain in feet at night that wakes you up from your sleep:     Blood clot in your veins:    Leg swelling:           PHYSICAL EXAM: Vitals:   01/31/22 1008  BP: 130/73  Pulse: 81  Resp: 20  Temp: 100.2 F (37.9 C)  TempSrc: Temporal  SpO2: 96%  Weight: 186 lb 2 oz (84.4 kg)   Height: 5\' 5"  (1.651 m)    GENERAL: The patient is a well-nourished male, in no acute distress. The vital signs are documented above. CARDIOVASCULAR: 2+ radial pulses bilaterally.  Excellent thrill in his right upper arm basilic fistula PULMONARY: There is good air exchange  MUSCULOSKELETAL: There are no major deformities or cyanosis. NEUROLOGIC: No focal weakness or paresthesias are detected. SKIN: There are no ulcers or rashes noted. PSYCHIATRIC: The patient has a normal affect.  DATA:  I imaged his right upper arm with SonoSite ultrasound.  This reveals excellent size maturation of his fistula throughout its course  MEDICAL ISSUES: Patient had right for stage basilic vein to brachial artery fistula in May 2021 but never presented for second stage.  I again discussed with him the concern regarding long-term use of catheter and the risk of this.  He wishes to proceed with second stage transposition.  We will coordinate this to change his dialysis today from Tuesday.  We will plan outpatient surgery at [facility] on 02/13/2022    Rosetta Posner, MD FACS [facility] Specialists of [facility] [phone]  Note: Portions of this report may have been transcribed using voice recognition software.  Every effort has been made to ensure accuracy; however, inadvertent computerized transcription errors may still be present.

## 2022-01-31 NOTE — Progress Notes (Signed)
Vascular and Vein Specialist of [place]  Patient name: Kerry Underwood MRN: [id number] DOB: 1951-04-06 Sex: male  REASON FOR VISIT: Discuss second stage basilic vein transposition  HPI: Kerry Underwood is a 71 y.o. male with end-stage renal disease.  He currently has dialysis via a right IJ catheter.  He has a rather interesting story.  He had catheter insertion and for stage right basilic vein transposition with Dr.Cain in May 2021.  He has not had a second stage.  He reports that he has to split wood for heat and was not willing to proceed with his second stage surgery.  He then reports that time got away from him.  He has had exchange of his catheter on 1 occasion for poor functioning.  Has had no history of infection of his catheter.  He currently undergoes dialysis at [facility] on Tuesday, Thursday and Saturday  Past Medical History:  Diagnosis Date   Anemia    CHF (congestive heart failure) (HCC)    COPD (chronic obstructive pulmonary disease) (HCC)    Full dentures    Hypertension    Pneumonia    Pyelonephritis 09/11/2017   Renal disorder    Stroke [facility])    " A few mini strokes, I didn't know it "    Family History  Problem Relation Age of Onset   Alzheimer's disease Mother     SOCIAL HISTORY: Social History   Tobacco Use   Smoking status: Every Day    Packs/day: 0.50    Years: 62.00    Pack years: 31.00    Types: Cigarettes   Smokeless tobacco: Former    Types: Chew   Tobacco comments:    Started smoking at age 15 - rolls his own tobacco cigarettes   Substance Use Topics   Alcohol use: No    Allergies  Allergen Reactions   Strawberry (Diagnostic)    Strawberry Extract     Other reaction(s): Unknown    Current Outpatient Medications  Medication Sig Dispense Refill   albuterol (VENTOLIN HFA) 108 (90 Base) MCG/ACT inhaler Inhale 2 puffs into the lungs every 4 (four) hours as needed for wheezing or shortness of  breath. 18 g 0   aspirin EC 81 MG tablet Take 1 tablet (81 mg total) by mouth daily. 30 tablet 1   atorvastatin (LIPITOR) 40 MG tablet Take 1 tablet (40 mg total) by mouth daily. 30 tablet 1   calcitRIOL (ROCALTROL) 0.25 MCG capsule Take 1 capsule (0.25 mcg total) by mouth Every Tuesday,Thursday,and Saturday with dialysis. (Patient not taking: Reported on 11/24/2021) 90 capsule 0   Cholecalciferol (VITAMIN D3 PO) Take by mouth daily.     clopidogrel (PLAVIX) 75 MG tablet Take 1 tablet (75 mg total) by mouth daily with breakfast. 30 tablet 1   fluticasone furoate-vilanterol (BREO ELLIPTA) 200-25 MCG/INH AEPB Inhale 1 puff into the lungs daily. 30 each 1   heparin 1000 unit/mL SOLN injection Heparin Sodium (Porcine) 1,000 Units/mL Catheter Lock Arterial     Methoxy PEG-Epoetin Beta (MIRCERA IJ) Mircera     metoprolol succinate (TOPROL XL) 25 MG 24 hr tablet Take 1 tablet (25 mg total) by mouth daily. 90 tablet 3   sucroferric oxyhydroxide (VELPHORO) 500 MG chewable tablet Chew 1,000 mg by mouth 3 (three) times daily with meals.     tamsulosin (FLOMAX) 0.4 MG CAPS capsule Take 0.4 mg by mouth at bedtime.     tiotropium (SPIRIVA HANDIHALER) 18 MCG inhalation capsule Place 1 capsule (18  mcg total) into inhaler and inhale daily. 30 capsule 1   No current facility-administered medications for this visit.    REVIEW OF SYSTEMS:  [X]  denotes positive finding, [ ]  denotes negative finding Cardiac  Comments:  Chest pain or chest pressure:    Shortness of breath upon exertion:    Short of breath when lying flat:    Irregular heart rhythm:        Vascular    Pain in calf, thigh, or hip brought on by ambulation:    Pain in feet at night that wakes you up from your sleep:     Blood clot in your veins:    Leg swelling:           PHYSICAL EXAM: Vitals:   01/31/22 1008  BP: 130/73  Pulse: 81  Resp: 20  Temp: 100.2 F (37.9 C)  TempSrc: Temporal  SpO2: 96%  Weight: 186 lb 2 oz (84.4 kg)   Height: 5\' 5"  (1.651 m)    GENERAL: The patient is a well-nourished male, in no acute distress. The vital signs are documented above. CARDIOVASCULAR: 2+ radial pulses bilaterally.  Excellent thrill in his right upper arm basilic fistula PULMONARY: There is good air exchange  MUSCULOSKELETAL: There are no major deformities or cyanosis. NEUROLOGIC: No focal weakness or paresthesias are detected. SKIN: There are no ulcers or rashes noted. PSYCHIATRIC: The patient has a normal affect.  DATA:  I imaged his right upper arm with SonoSite ultrasound.  This reveals excellent size maturation of his fistula throughout its course  MEDICAL ISSUES: Patient had right for stage basilic vein to brachial artery fistula in May 2021 but never presented for second stage.  I again discussed with him the concern regarding long-term use of catheter and the risk of this.  He wishes to proceed with second stage transposition.  We will coordinate this to change his dialysis today from Tuesday.  We will plan outpatient surgery at [facility] on 02/13/2022    Rosetta Posner, MD FACS [facility] Vein Specialists of [facility] [phone]  Note: Portions of this report may have been transcribed using voice recognition software.  Every effort has been made to ensure accuracy; however, inadvertent computerized transcription errors may still be present.

## 2022-02-02 ENCOUNTER — Other Ambulatory Visit: Payer: Self-pay

## 2022-02-07 ENCOUNTER — Ambulatory Visit: Payer: 59 | Admitting: Vascular Surgery

## 2022-02-09 ENCOUNTER — Encounter (HOSPITAL_COMMUNITY)
Admission: RE | Admit: 2022-02-09 | Discharge: 2022-02-09 | Disposition: A | Discharge status: Home / Self Care | Payer: 59 | Source: Ambulatory Visit | Attending: Vascular Surgery | Admitting: Vascular Surgery

## 2022-02-13 ENCOUNTER — Encounter (HOSPITAL_COMMUNITY)
Admission: RE | Disposition: A | Discharge status: Home / Self Care | Payer: Self-pay | Source: Home / Self Care | Attending: Vascular Surgery

## 2022-02-13 ENCOUNTER — Ambulatory Visit (HOSPITAL_COMMUNITY): Payer: 59 | Admitting: Anesthesiology

## 2022-02-13 ENCOUNTER — Ambulatory Visit (HOSPITAL_COMMUNITY)
Admission: RE | Admit: 2022-02-13 | Discharge: 2022-02-13 | Disposition: A | Discharge status: Home / Self Care | Payer: 59 | Attending: Vascular Surgery | Admitting: Vascular Surgery

## 2022-02-13 ENCOUNTER — Other Ambulatory Visit: Payer: Self-pay

## 2022-02-13 ENCOUNTER — Encounter (HOSPITAL_COMMUNITY): Payer: Self-pay | Admitting: Vascular Surgery

## 2022-02-13 ENCOUNTER — Ambulatory Visit (HOSPITAL_BASED_OUTPATIENT_CLINIC_OR_DEPARTMENT_OTHER): Payer: 59 | Admitting: Anesthesiology

## 2022-02-13 DIAGNOSIS — D631 Anemia in chronic kidney disease: Secondary | ICD-10-CM | POA: Diagnosis not present

## 2022-02-13 DIAGNOSIS — I120 Hypertensive chronic kidney disease with stage 5 chronic kidney disease or end stage renal disease: Secondary | ICD-10-CM | POA: Diagnosis not present

## 2022-02-13 DIAGNOSIS — I1311 Hypertensive heart and chronic kidney disease without heart failure, with stage 5 chronic kidney disease, or end stage renal disease: Secondary | ICD-10-CM

## 2022-02-13 DIAGNOSIS — Z992 Dependence on renal dialysis: Secondary | ICD-10-CM | POA: Insufficient documentation

## 2022-02-13 DIAGNOSIS — Z452 Encounter for adjustment and management of vascular access device: Secondary | ICD-10-CM | POA: Diagnosis present

## 2022-02-13 DIAGNOSIS — J449 Chronic obstructive pulmonary disease, unspecified: Secondary | ICD-10-CM | POA: Insufficient documentation

## 2022-02-13 DIAGNOSIS — D759 Disease of blood and blood-forming organs, unspecified: Secondary | ICD-10-CM | POA: Insufficient documentation

## 2022-02-13 DIAGNOSIS — N185 Chronic kidney disease, stage 5: Secondary | ICD-10-CM | POA: Diagnosis not present

## 2022-02-13 DIAGNOSIS — D649 Anemia, unspecified: Secondary | ICD-10-CM | POA: Diagnosis not present

## 2022-02-13 DIAGNOSIS — N186 End stage renal disease: Secondary | ICD-10-CM | POA: Insufficient documentation

## 2022-02-13 DIAGNOSIS — F17210 Nicotine dependence, cigarettes, uncomplicated: Secondary | ICD-10-CM | POA: Insufficient documentation

## 2022-02-13 DIAGNOSIS — I350 Nonrheumatic aortic (valve) stenosis: Secondary | ICD-10-CM | POA: Diagnosis not present

## 2022-02-13 HISTORY — PX: BASCILIC VEIN TRANSPOSITION: SHX5742

## 2022-02-13 SURGERY — TRANSPOSITION, VEIN, BASILIC
Anesthesia: General | Site: Arm Upper | Laterality: Right

## 2022-02-13 MED ORDER — CHLORHEXIDINE GLUCONATE 4 % EX LIQD: 60.0000 mL | Freq: Once | CUTANEOUS | Status: DC

## 2022-02-13 MED ORDER — FENTANYL CITRATE PF 50 MCG/ML IJ SOSY: 25.0000 ug | PREFILLED_SYRINGE | INTRAMUSCULAR | Status: DC | PRN

## 2022-02-13 MED ORDER — PROPOFOL 10 MG/ML IV BOLUS
INTRAVENOUS | Status: AC
  Filled 2022-02-13: qty 20

## 2022-02-13 MED ORDER — HEPARIN SODIUM (PORCINE) 1000 UNIT/ML IJ SOLN
INTRAMUSCULAR | Status: AC
  Filled 2022-02-13: qty 1

## 2022-02-13 MED ORDER — HEPARIN 6000 UNIT IRRIGATION SOLUTION
Status: DC | PRN
  Administered 2022-02-13: 1

## 2022-02-13 MED ORDER — HEPARIN SODIUM (PORCINE) 1000 UNIT/ML IJ SOLN
INTRAMUSCULAR | Status: AC
  Filled 2022-02-13: qty 6

## 2022-02-13 MED ORDER — CEFAZOLIN SODIUM-DEXTROSE 2-4 GM/100ML-% IV SOLN
2.0000 g | INTRAVENOUS | Status: AC
  Administered 2022-02-13: 2 g via INTRAVENOUS
  Filled 2022-02-13: qty 100

## 2022-02-13 MED ORDER — PHENYLEPHRINE HCL-NACL 20-0.9 MG/250ML-% IV SOLN
INTRAVENOUS | Status: DC | PRN
  Administered 2022-02-13: 66 ug/min via INTRAVENOUS

## 2022-02-13 MED ORDER — ONDANSETRON HCL 4 MG/2ML IJ SOLN
INTRAMUSCULAR | Status: DC | PRN
  Administered 2022-02-13: 4 mg via INTRAVENOUS

## 2022-02-13 MED ORDER — FENTANYL CITRATE (PF) 100 MCG/2ML IJ SOLN
INTRAMUSCULAR | Status: DC | PRN
  Administered 2022-02-13 (×2): 50 ug via INTRAVENOUS

## 2022-02-13 MED ORDER — LIDOCAINE HCL (CARDIAC) PF 100 MG/5ML IV SOSY
PREFILLED_SYRINGE | INTRAVENOUS | Status: DC | PRN
  Administered 2022-02-13: 50 mg via INTRAVENOUS

## 2022-02-13 MED ORDER — 0.9 % SODIUM CHLORIDE (POUR BTL) OPTIME
TOPICAL | Status: DC | PRN
  Administered 2022-02-13: 1000 mL

## 2022-02-13 MED ORDER — LIDOCAINE-EPINEPHRINE 0.5 %-1:200000 IJ SOLN
INTRAMUSCULAR | Status: AC
  Filled 2022-02-13: qty 1

## 2022-02-13 MED ORDER — STERILE WATER FOR IRRIGATION IR SOLN
Status: DC | PRN
  Administered 2022-02-13: 500 mL

## 2022-02-13 MED ORDER — LIDOCAINE-EPINEPHRINE 0.5 %-1:200000 IJ SOLN
INTRAMUSCULAR | Status: DC | PRN
  Administered 2022-02-13: 20 mL

## 2022-02-13 MED ORDER — PROPOFOL 500 MG/50ML IV EMUL
INTRAVENOUS | Status: DC | PRN
  Administered 2022-02-13: 100 ug/kg/min via INTRAVENOUS

## 2022-02-13 MED ORDER — OXYCODONE-ACETAMINOPHEN 5-325 MG PO TABS
1.0000 | ORAL_TABLET | Freq: Once | ORAL | Status: AC
  Administered 2022-02-13: 1 via ORAL

## 2022-02-13 MED ORDER — DEXMEDETOMIDINE (PRECEDEX) IN NS 20 MCG/5ML (4 MCG/ML) IV SYRINGE
PREFILLED_SYRINGE | INTRAVENOUS | Status: DC | PRN
  Administered 2022-02-13: 12 ug via INTRAVENOUS

## 2022-02-13 MED ORDER — OXYCODONE-ACETAMINOPHEN 5-325 MG PO TABS
ORAL_TABLET | ORAL | Status: AC
  Filled 2022-02-13: qty 1

## 2022-02-13 MED ORDER — MIDAZOLAM HCL 2 MG/2ML IJ SOLN
INTRAMUSCULAR | Status: AC
  Filled 2022-02-13: qty 2

## 2022-02-13 MED ORDER — ONDANSETRON HCL 4 MG/2ML IJ SOLN: 4.0000 mg | Freq: Once | INTRAMUSCULAR | Status: DC | PRN

## 2022-02-13 MED ORDER — OXYCODONE-ACETAMINOPHEN 5-325 MG PO TABS: 1.0000 | ORAL_TABLET | Freq: Four times a day (QID) | ORAL | 0 refills | Status: AC | PRN

## 2022-02-13 MED ORDER — SODIUM CHLORIDE 0.9 % IV SOLN: INTRAVENOUS | Status: DC

## 2022-02-13 MED ORDER — FENTANYL CITRATE (PF) 100 MCG/2ML IJ SOLN
INTRAMUSCULAR | Status: AC
  Filled 2022-02-13: qty 2

## 2022-02-13 MED ORDER — MIDAZOLAM HCL 5 MG/5ML IJ SOLN
INTRAMUSCULAR | Status: DC | PRN
Start: 2022-02-13 — End: 2022-02-13
  Administered 2022-02-13: 2 mg via INTRAVENOUS

## 2022-02-13 MED ORDER — PROPOFOL 10 MG/ML IV BOLUS
INTRAVENOUS | Status: DC | PRN
Start: 2022-02-13 — End: 2022-02-13
  Administered 2022-02-13: 100 mg via INTRAVENOUS

## 2022-02-13 SURGICAL SUPPLY — 45 items
ADH SKN CLS APL DERMABOND .7 (GAUZE/BANDAGES/DRESSINGS) ×1
ARMBAND PINK RESTRICT EXTREMIT (MISCELLANEOUS) ×2 IMPLANT
BAG HAMPER (MISCELLANEOUS) ×2 IMPLANT
CANNULA VESSEL 3MM 2 BLNT TIP (CANNULA) ×2 IMPLANT
CLIP LIGATING EXTRA MED SLVR (CLIP) ×2 IMPLANT
CLIP LIGATING EXTRA SM BLUE (MISCELLANEOUS) ×2 IMPLANT
COVER LIGHT HANDLE STERIS (MISCELLANEOUS) ×2 IMPLANT
COVER MAYO STAND XLG (MISCELLANEOUS) ×2 IMPLANT
COVER PROBE U/S 5X48 (MISCELLANEOUS) IMPLANT
DERMABOND ADVANCED (GAUZE/BANDAGES/DRESSINGS) ×1
DERMABOND ADVANCED .7 DNX12 (GAUZE/BANDAGES/DRESSINGS) ×1 IMPLANT
ELECT REM PT RETURN 9FT ADLT (ELECTROSURGICAL) ×2
ELECTRODE REM PT RTRN 9FT ADLT (ELECTROSURGICAL) ×1 IMPLANT
GAUZE 4X4 16PLY ~~LOC~~+RFID DBL (SPONGE) ×1 IMPLANT
GAUZE SPONGE 4X4 12PLY STRL (GAUZE/BANDAGES/DRESSINGS) ×2 IMPLANT
GLOVE SURG MICRO LTX SZ7.5 (GLOVE) ×2 IMPLANT
GLOVE SURG UNDER POLY LF SZ7 (GLOVE) ×6 IMPLANT
GOWN STRL REUS W/TWL LRG LVL3 (GOWN DISPOSABLE) ×6 IMPLANT
IV NS 500ML (IV SOLUTION) ×2
IV NS 500ML BAXH (IV SOLUTION) ×1 IMPLANT
KIT BLADEGUARD II DBL (SET/KITS/TRAYS/PACK) ×2 IMPLANT
KIT TURNOVER KIT A (KITS) ×2 IMPLANT
MANIFOLD NEPTUNE II (INSTRUMENTS) ×2 IMPLANT
MARKER SKIN DUAL TIP RULER LAB (MISCELLANEOUS) ×4 IMPLANT
NDL HYPO 18GX1.5 BLUNT FILL (NEEDLE) ×2 IMPLANT
NEEDLE HYPO 18GX1.5 BLUNT FILL (NEEDLE) ×4 IMPLANT
NS IRRIG 1000ML POUR BTL (IV SOLUTION) ×2 IMPLANT
PACK CV ACCESS (CUSTOM PROCEDURE TRAY) ×2 IMPLANT
PAD ARMBOARD 7.5X6 YLW CONV (MISCELLANEOUS) ×2 IMPLANT
POSITIONER HEAD 8X9X4 ADT (SOFTGOODS) ×2 IMPLANT
SET BASIN LINEN APH (SET/KITS/TRAYS/PACK) ×2 IMPLANT
SOL PREP POV-IOD 4OZ 10% (MISCELLANEOUS) ×2 IMPLANT
SOL PREP PROV IODINE SCRUB 4OZ (MISCELLANEOUS) ×2 IMPLANT
SPONGE T-LAP 18X18 ~~LOC~~+RFID (SPONGE) ×2 IMPLANT
SUT PROLENE 6 0 CC (SUTURE) ×2 IMPLANT
SUT SILK 2 0 SH (SUTURE) ×1 IMPLANT
SUT SILK 3 0 (SUTURE)
SUT SILK 3-0 18XBRD TIE 12 (SUTURE) IMPLANT
SUT VIC AB 3-0 SH 27 (SUTURE) ×4
SUT VIC AB 3-0 SH 27X BRD (SUTURE) ×1 IMPLANT
SYR 10ML LL (SYRINGE) ×2 IMPLANT
SYR 50ML LL SCALE MARK (SYRINGE) ×1 IMPLANT
SYR BULB IRRIG 60ML STRL (SYRINGE) ×2 IMPLANT
UNDERPAD 30X36 HEAVY ABSORB (UNDERPADS AND DIAPERS) ×2 IMPLANT
WATER STERILE IRR 1000ML POUR (IV SOLUTION) ×2 IMPLANT

## 2022-02-13 NOTE — Interval H&P Note (Signed)
History and Physical Interval Note: ? ?02/13/2022 ?9:20 AM ? ?Kerry Underwood  has presented today for surgery, with the diagnosis of ESRD.  The various methods of treatment have been discussed with the patient and family. After consideration of risks, benefits and other options for treatment, the patient has consented to  Procedure(s): ?RIGHT ARM SECOND STAGE BASILIC VEIN TRANSPOSITION (Right) as a surgical intervention.  The patient's history has been reviewed, patient examined, no change in status, stable for surgery.  I have reviewed the patient's chart and labs.  Questions were answered to the patient's satisfaction.   ? ? ?Quita Mcgrory ? ? ?

## 2022-02-13 NOTE — Anesthesia Preprocedure Evaluation (Signed)
Anesthesia Evaluation  ?Patient identified by MRN, date of birth, ID band ?Patient awake ? ? ? ?Reviewed: ?Allergy & Precautions, H&P , NPO status , Patient's Chart, lab work & pertinent test results, reviewed documented beta blocker date and time  ? ?Airway ?Mallampati: II ? ?TM Distance: >3 FB ?Neck ROM: full ? ? ? Dental ?no notable dental hx. ?(+) Upper Dentures, Lower Dentures, Edentulous Upper, Edentulous Lower ?  ?Pulmonary ?COPD, Current Smoker,  ?  ?Pulmonary exam normal ?breath sounds clear to auscultation ? ? ? ? ? ? Cardiovascular ?Exercise Tolerance: Good ?hypertension, + Valvular Problems/Murmurs AS  ?Rhythm:regular Rate:Normal ? ? ?  ?Neuro/Psych ?TIAnegative psych ROS  ? GI/Hepatic ?negative GI ROS, (+) Hepatitis -, C  ?Endo/Other  ?negative endocrine ROS ? Renal/GU ?ESRF and DialysisRenal disease  ?negative genitourinary ?  ?Musculoskeletal ? ? Abdominal ?  ?Peds ? Hematology ? ?(+) Blood dyscrasia, anemia ,   ?Anesthesia Other Findings ?1. Left ventricular ejection fraction, by estimation, is 60 to 65%. The  ?left ventricle has normal function. The left ventricle has no regional  ?wall motion abnormalities. There is mild left ventricular hypertrophy.  ?Left ventricular diastolic parameters  ?are consistent with Grade II diastolic dysfunction (pseudonormalization).  ??2. Right ventricular systolic function is normal. The right ventricular  ?size is normal. There is mildly elevated pulmonary artery systolic  ?pressure. The estimated right ventricular systolic pressure is 90.2 mmHg.  ??3. Left atrial size was mildly dilated.  ??4. The mitral valve is degenerative. Mild mitral valve regurgitation.  ?Mild calcific mitral stenosis. Mean gradient 61mmHg at HR 71; MVA 1.9cm2 by  ?continuity. Severe mitral annular calcification.  ??5. The aortic valve is calcified. There is moderate-to-severe  ?calcification of the aortic valve. There is moderate-to-severe thickening  ?of  the aortic valve. Aortic valve regurgitation is not visualized.  ?Moderate aortic valve stenosis. AVA 0.91cm2, mean  ?gradient 74mmHg, peak gradient 32mmHg, Vmax 3.22m/s, DI 0.3.  ??6. The inferior vena cava is dilated in size with >50% respiratory  ?variability, suggesting right atrial pressure of 8 mmHg.  ? Reproductive/Obstetrics ?negative OB ROS ? ?  ? ? ? ? ? ? ? ? ? ? ? ? ? ?  ?  ? ? ? ? ? ? ? ? ?Anesthesia Physical ?Anesthesia Plan ? ?ASA: 3 ? ?Anesthesia Plan: General and General LMA  ? ?Post-op Pain Management:   ? ?Induction:  ? ?PONV Risk Score and Plan:  ? ?Airway Management Planned:  ? ?Additional Equipment:  ? ?Intra-op Plan:  ? ?Post-operative Plan:  ? ?Informed Consent: I have reviewed the patients History and Physical, chart, labs and discussed the procedure including the risks, benefits and alternatives for the proposed anesthesia with the patient or authorized representative who has indicated his/her understanding and acceptance.  ? ? ? ?Dental Advisory Given ? ?Plan Discussed with: CRNA ? ?Anesthesia Plan Comments:   ? ? ? ? ? ? ?Anesthesia Quick Evaluation ? ?

## 2022-02-13 NOTE — Transfer of Care (Signed)
Immediate Anesthesia Transfer of Care Note ? ?Patient: Kerry Underwood ? ?Procedure(s) Performed: RIGHT ARM SECOND STAGE BASILIC VEIN TRANSPOSITION (Right: Arm Upper) ? ?Patient Location: PACU ? ?Anesthesia Type:General ? ?Level of Consciousness: awake, alert , oriented and patient cooperative ? ?Airway & Oxygen Therapy: Patient Spontanous Breathing and Patient connected to nasal cannula oxygen ? ?Post-op Assessment: Report given to RN, Post -op Vital signs reviewed and stable and Patient moving all extremities X 4 ? ?Post vital signs: Reviewed and stable ? ?Last Vitals:  ?Vitals Value Taken Time  ?BP 138/79 02/13/22 1226  ?Temp    ?Pulse 82 02/13/22 1227  ?Resp 13 02/13/22 1227  ?SpO2 100 % 02/13/22 1227  ?Vitals shown include unvalidated device data. ? ?Last Pain: There were no vitals filed for this visit.   ? ?  ? ?Complications: No notable events documented. ?

## 2022-02-13 NOTE — Anesthesia Procedure Notes (Signed)
Procedure Name: LMA Insertion ?Date/Time: 02/13/2022 10:00 AM ?Performed by: Jonna Munro, CRNA ?Pre-anesthesia Checklist: Patient identified, Emergency Drugs available, Suction available, Patient being monitored and Timeout performed ?Patient Re-evaluated:Patient Re-evaluated prior to induction ?Oxygen Delivery Method: Circle system utilized ?Preoxygenation: Pre-oxygenation with 100% oxygen ?Induction Type: IV induction ?LMA: LMA inserted ?LMA Size: 4.0 ?Number of attempts: 1 ?Placement Confirmation: positive ETCO2 and breath sounds checked- equal and bilateral ?Tube secured with: Tape ?Dental Injury: Teeth and Oropharynx as per pre-operative assessment  ? ? ? ? ?

## 2022-02-13 NOTE — Op Note (Signed)
? ? ?  OPERATIVE REPORT ? ?DATE OF SURGERY: 02/13/2022 ? ?PATIENT: Kerry Underwood, 71 y.o. male ?MRN: [id number]  ?DOB: 22-Dec-1950 ? ?PRE-OPERATIVE DIAGNOSIS: End-stage renal disease ? ?POST-OPERATIVE DIAGNOSIS:  Same ? ?PROCEDURE: Right second stage basilic vein transposition ? ?SURGEON:  Curt Jews, M.D. ? ?PHYSICIAN ASSISTANT: Vevelyn Royals, RN ? ?The assistant was needed for exposure and to expedite the case ? ?ANESTHESIA: LMA ? ?EBL: per anesthesia record ? ?Total I/O ?In: 600 [I.V.:500; IV Piggyback:100] ?Out: 10 [Blood:10] ? ?BLOOD ADMINISTERED: none ? ?DRAINS: none ? ?SPECIMEN: none ? ?COUNTS CORRECT:  YES ? ?PATIENT DISPOSITION:  PACU - hemodynamically stable ? ?PROCEDURE DETAILS: ?Patient was taken operating placed supine position where the area of the right arm prepped draped you sterile fashion.  SonoSite ultrasound was used to visualize the basilic vein which was of large caliber.  Location was marked on the skin.  Incision was made through the prior antecubital incision carried down to isolate the basilic vein to brachial artery anastomosis.  The brachial artery was exposed proximal distal to the old anastomosis.  The basilic vein was mobilized circumferentially from the antecubital space to the axilla using 3 additional small incisions.  Tributary branches were ligated with 2-0 and 3-0 silk ties and divided.  The vein was marked to reduce risk of twisting.  The brachial artery was occluded proximal distal to the old anastomosis and the old anastomosis was taken down.  The vein was brought out through the tunnel.  A new subcutaneous tunnel was created somewhat more lateral from the antecubital incision to the axillary incision and the basilic vein was brought back through this tunnel.  The vein was cut to the appropriate length and was spatulated and sewn end-to-side to the artery with a running 6-0 Prolene suture.  Clamps were removed and excellent thrill was noted.  The wounds were irrigated with saline.   Hemostasis was obtained with electrocautery.  Wound was closed with 3-0 Vicryl in the subcutaneous and subcuticular tissue.  Sterile dressing was applied and the patient was transferred to the recovery room in stable condition ? ? ?Kerry Underwood, M.D., FACS ?02/13/2022 ?12:31 PM ? ?Note: Portions of this report may have been transcribed using voice recognition software.  Every effort has been made to ensure accuracy; however, inadvertent computerized transcription errors may still be present. ? ? ?

## 2022-02-13 NOTE — Discharge Instructions (Signed)
? ?[facility] ? ?Discharge Instructions ? ?AV Fistula or Graft Surgery for Dialysis Access ? ?Please refer to the following instructions for your post-procedure care. Your surgeon or physician assistant will discuss any changes with you. ? ?Activity ? ?You may drive the day following your surgery, if you are comfortable and no longer taking prescription pain medication. Resume full activity as the soreness in your incision resolves. ? ?Bathing/Showering ? ?You may shower after you go home. Keep your incision dry for 48 hours. Do not soak in a bathtub, hot tub, or swim until the incision heals completely. You may not shower if you have a hemodialysis catheter. ? ?Incision Care ? ?Clean your incision with mild soap and water after 48 hours. Pat the area dry with a clean towel. You do not need a bandage unless otherwise instructed. Do not apply any ointments or creams to your incision. You may have skin glue on your incision. Do not peel it off. It will come off on its own in about one week. Your arm may swell a bit after surgery. To reduce swelling use pillows to elevate your arm so it is above your heart. Your doctor will tell you if you need to lightly wrap your arm with an ACE bandage. ? ?Diet ? ?Resume your normal diet. There are not special food restrictions following this procedure. In order to heal from your surgery, it is CRITICAL to get adequate nutrition. Your body requires vitamins, minerals, and protein. Vegetables are the best source of vitamins and minerals. Vegetables also provide the perfect balance of protein. Processed food has little nutritional value, so try to avoid this. ? ?Medications ? ?Resume taking all of your medications. If your incision is causing pain, you may take over-the counter pain relievers such as acetaminophen (Tylenol). If you were prescribed a stronger pain medication, please be aware these medications can cause nausea and constipation. Prevent  nausea by taking the medication with a snack or meal. Avoid constipation by drinking plenty of fluids and eating foods with high amount of fiber, such as fruits, vegetables, and grains.  ?Do not take Tylenol if you are taking prescription pain medications. ? ?Follow up ?Your surgeon may want to see you in the office following your access surgery. If so, this will be arranged at the time of your surgery. ? ?Please call us immediately for any of the following conditions: ? ?Increased pain, redness, drainage (pus) from your incision site ?Fever of 101 degrees or higher ?Severe or worsening pain at your incision site ?Hand pain or numbness. ? ?Reduce your risk of vascular disease: ? ?Stop smoking. If you would like help, call [facility] at 1-800-QUIT-NOW 985-770-9290) or [place] at (610) 324-4265 ? ?Manage your cholesterol ?Maintain a desired weight ?Control your diabetes ?Keep your blood pressure down ? ?Dialysis ? ?It will take several weeks to several months for your new dialysis access to be ready for use. Your surgeon will determine when it is okay to use it. Your nephrologist will continue to direct your dialysis. You can continue to use your Permcath until your new access is ready for use. ? ? ?02/13/2022 ?Kerry Underwood ?[id number] ?June 13, 1951 ? ?Surgeon(s): ?Somara Frymire, Arvilla Meres, MD ? ?Procedure(s): ?RIGHT ARM SECOND STAGE BASILIC VEIN TRANSPOSITION ? ? May stick graft immediately  ? May stick graft on designated area only:   ? Do not stick to fistula for 6 weeks  ? ? ?If you have any questions, please call the office at  [phone]. ? ?

## 2022-02-14 LAB — POCT I-STAT, CHEM 8
BUN: 31 mg/dL — ABNORMAL HIGH (ref 8–23)
Calcium, Ion: 1 mmol/L — ABNORMAL LOW (ref 1.15–1.40)
Chloride: 97 mmol/L — ABNORMAL LOW (ref 98–111)
Creatinine, Ser: 6.6 mg/dL — ABNORMAL HIGH (ref 0.61–1.24)
Glucose, Bld: 91 mg/dL (ref 70–99)
HCT: 43 % (ref 39.0–52.0)
Hemoglobin: 14.6 g/dL (ref 13.0–17.0)
Potassium: 3.8 mmol/L (ref 3.5–5.1)
Sodium: 136 mmol/L (ref 135–145)
TCO2: 26 mmol/L (ref 22–32)

## 2022-02-14 NOTE — Anesthesia Postprocedure Evaluation (Signed)
Anesthesia Post Note ? ?Patient: Kerry Underwood ? ?Procedure(s) Performed: RIGHT ARM SECOND STAGE BASILIC VEIN TRANSPOSITION (Right: Arm Upper) ? ?Patient location during evaluation: Phase II ?Anesthesia Type: General ?Level of consciousness: awake ?Pain management: pain level controlled ?Vital Signs Assessment: post-procedure vital signs reviewed and stable ?Respiratory status: spontaneous breathing and respiratory function stable ?Cardiovascular status: blood pressure returned to baseline and stable ?Postop Assessment: no headache and no apparent nausea or vomiting ?Anesthetic complications: no ?Comments: Late entry ? ? ?No notable events documented. ? ? ?Last Vitals:  ?Vitals:  ? 02/13/22 1303 02/13/22 1308  ?BP:  126/71  ?Pulse: 76   ?Resp: 16   ?Temp: 36.8 ?C   ?SpO2: 100%   ?  ?Last Pain:  ?Vitals:  ? 02/13/22 1305  ?TempSrc:   ?PainSc: 6   ? ? ?  ?  ?  ?  ?  ?  ? ?Louann Sjogren ? ? ? ? ?

## 2022-02-16 ENCOUNTER — Encounter (HOSPITAL_COMMUNITY): Payer: Self-pay | Admitting: Vascular Surgery

## 2022-02-23 ENCOUNTER — Inpatient Hospital Stay: Payer: 59 | Admitting: Internal Medicine

## 2022-02-23 NOTE — Progress Notes (Deleted)
? ?Kerry Underwood, male    DOB: 11-29-1951   MRN: [id number] ? ? ?Brief patient profile:  ?***  yo*** *** referred to pulmonary clinic in [place]  02/23/2022 by *** for ***  ? ?Admit date: 11/24/2021 ?Discharge date: 11/28/2021 ?  ?  ?Discharge Diagnoses:  ?Principal Problem: ?  Acute respiratory failure with hypoxia ([place]) ?Active Problems: ?  Hypertension secondary to other renal disorders ?  ESRD (end stage renal disease) ([place]) ?  COPD with acute exacerbation ([place]) ?  Influenza A Infection ?  Chronic diastolic heart failure ([place]) ?  Chronic viral hepatitis C ([place]) ?  Demand ischemia ([place]) ?  ?  ?Brief Summary: ?71 year old male, lives alone, independent, medical history significant for chronic diastolic CHF (2D echo 1/0/2725 in [place] with LVEF 55-60%), COPD, ongoing tobacco use, hypertension, ESRD on TTS HD, anemia in ESRD, PAD with intervention, stroke, presented initially to [facility] ED on 11/24/2021 with complaints of progressive dyspnea, cough, fever for which his home inhalers were not helping.  Seen by his PCP and noted to be hypoxic to 60% on room air.  Admitted for influenza A acute bronchitis and NSTEMI.  Case discussed with cardiologist at [facility] and as per recommendations transferred to [facility] for further evaluation by them and IV heparin initiated.  Cardiology have determined that troponin elevation is more from demand ischemia, echo unremarkable, plan outpatient work-up, IV heparin discontinued.  Continued to have issues with COPD and wheezing.  Suspect he has very advanced COPD given history of 62 years of smoking.  Pulmonology consulted for assistance. ?  ?  ?Assessment & Plan:  ?  ?NSTEMI type II, troponin elevation due to demand ischemia: In the setting of influenza A acute bronchitis and COPD exacerbation.  Denies prior history of MI or CAD.  No chest pain.  HS Troponin cycled and peaked at 546 and trending down (540 > 546 > 452 > 427).  Started on IV heparin.  2D echo: LVEF 60-65%,  no regional wall motion abnormalities and grade 2 diastolic dysfunction moderate aortic valve stenosis.  Continue atorvastatin, aspirin, Plavix and metoprolol tartrate changed to succinate.   Cardiology have determined that troponin elevation is more from demand ischemia, echo unremarkable, plan outpatient work-up, IV heparin discontinued.  Cardiology have signed off and will arrange outpatient follow-up for further evaluation.  Metoprolol was discontinued by nephrology due to BPs down with volume management across HD. ? ?Influenza A acute bronchitis with COPD exacerbation: Tobacco cessation counseled.  Treated with IV>p.o. steroids, Tamiflu (completed course), bronchodilator nebulization scheduled and as needed, Breo Ellipta and Incruse Ellipta and added flutter valve.  Chest x-ray 12/18 personally reviewed and without acute findings.  Added doxycycline for possible superadded bacterial infection but patient does not want to take due to side effects with antibiotics that cause him to have profuse diarrhea and has refused even today.  Pulmonology consultation appreciated: Very likely has COPD, noncompliant with outpatient therapies for same, they had a lengthy conversation with the patient explaining the importance of compliance with his medical regimen, outpatient follow-up with pulmonology which they will arrange in the [place] office, quitting smoking (patient declines) and getting lung cancer screening.  Will need outpatient PFTs.  Although they had recommended Trelegy at discharge, I just discussed with their team and they have advised to continue prior home meds including albuterol inhaler as needed, Breo Ellipta and Spiriva and further adjustments can be made as outpatient. ? ?Tobacco abuse: Cessation counseled.  Patient not interested in  quitting.  Reports that he has been smoking for 62 years. ? ?Acute respiratory failure with hypoxia: Oxygen saturations in the 60s at PCPs office.  Evaluated for home  oxygen needs today and patient does not qualify.  Saturating at 95% on room air at rest and 93% on room air while ambulating. ? ?Essential hypertension: Patient was on amlodipine and metoprolol which have been discontinued by nephrology due to control after volume management across HD. ? ?Hyperlipidemia: Continue statins ? ?ESRD on TTS HD: Follows with Dr. Marval Regal, [facility].  Mild hyperkalemia resolved.  Underwent HD on 12/27 and 12/20.  Continue outpatient HD and nephrology follow-up.  Has hyponatremia which is mild and appears chronic and intermittent. ?  ?Anemia in ESRD: Last hemoglobin in system was in the 8 g range.  Presented with hemoglobin of 14.6 which was likely erroneously high.  Hemoglobin has gradually dropped to 9.2 today.  However on looking at his prior numbers in October, his baseline was probably in the 8-9 range.  So this is likely his baseline.  No bleeding reported.  Close outpatient follow-up at dialysis with labs will be drawn.  He also has mild thrombocytopenia which appears chronic and intermittent.  Stable. ? ?Chronic diastolic CHF: Volume management across HD.  Clinically euvolemic and chest x-ray 12/18 without pulmonary edema. ?  ?Moderate Aortic Stenosis: OP follow up with serial Echo's ? ?GERD: Patient does not take PPI at home. ?  ?PAD: Follows with Dr. Virl Cagey and had interventions on his right lower extremity in October 2022.  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  Continue Plavix and statins.  Tobacco cessation was counseled. ?  ?  ? ?History of Present Illness  ?02/23/2022  Pulmonary/ 1st office eval/ [name] / [name]  ?No chief complaint on file. ?  ? ?Dyspnea:  *** ?Cough: *** ?Sleep: *** ?SABA use:  ? ?Past Medical History:  ?Diagnosis Date  ? Anemia   ? CHF (congestive heart failure) ([place])   ? COPD (chronic obstructive pulmonary disease) ([place])   ? Full dentures   ? Hypertension   ?  Pneumonia   ? Pyelonephritis 09/11/2017  ? Renal disorder   ? Stroke [facility])   ? " A few mini strokes, I didn't know it "  ? ? ?Outpatient Medications Prior to Visit  ?Medication Sig Dispense Refill  ? albuterol (VENTOLIN HFA) 108 (90 Base) MCG/ACT inhaler Inhale 2 puffs into the lungs every 4 (four) hours as needed for wheezing or shortness of breath. 18 g 0  ? aspirin EC 81 MG tablet Take 1 tablet (81 mg total) by mouth daily. 30 tablet 1  ? atorvastatin (LIPITOR) 40 MG tablet Take 1 tablet (40 mg total) by mouth daily. 30 tablet 1  ? calcitRIOL (ROCALTROL) 0.25 MCG capsule Take 1 capsule (0.25 mcg total) by mouth Every Tuesday,Thursday,and Saturday with dialysis. 90 capsule 0  ? Cholecalciferol (VITAMIN D3 PO) Take by mouth daily.    ? clopidogrel (PLAVIX) 75 MG tablet Take 1 tablet (75 mg total) by mouth daily with breakfast. 30 tablet 1  ? fluticasone furoate-vilanterol (BREO ELLIPTA) 200-25 MCG/INH AEPB Inhale 1 puff into the lungs daily. 30 each 1  ? heparin 1000 unit/mL SOLN injection Heparin Sodium (Porcine) 1,000 Units/mL Catheter Lock Arterial    ? Methoxy PEG-Epoetin Beta (MIRCERA IJ) Mircera    ? metoprolol succinate (TOPROL XL) 25 MG 24 hr tablet Take 1 tablet (25 mg total) by mouth  daily. 90 tablet 3  ? oxyCODONE-acetaminophen (PERCOCET) 5-325 MG tablet Take 1 tablet by mouth every 6 (six) hours as needed for severe pain. 8 tablet 0  ? sucroferric oxyhydroxide (VELPHORO) 500 MG chewable tablet Chew 1,000 mg by mouth 3 (three) times daily with meals.    ? tamsulosin (FLOMAX) 0.4 MG CAPS capsule Take 0.4 mg by mouth at bedtime.    ? tiotropium (SPIRIVA HANDIHALER) 18 MCG inhalation capsule Place 1 capsule (18 mcg total) into inhaler and inhale daily. 30 capsule 1  ? ?No facility-administered medications prior to visit.  ? ? ? ?Objective:  ?  ? ?There were no vitals taken for this visit. ? ?  ? ? ?   ?Assessment  ? ?No problem-specific Assessment & Plan notes found for this encounter. ? ? ? ? ?Christinia Gully, MD ?02/23/2022 ?   ?

## 2022-03-21 ENCOUNTER — Encounter: Payer: 59 | Admitting: Vascular Surgery

## 2022-04-18 ENCOUNTER — Other Ambulatory Visit: Payer: Self-pay

## 2022-05-01 ENCOUNTER — Encounter (HOSPITAL_COMMUNITY): Payer: Self-pay | Admitting: Vascular Surgery

## 2022-05-01 ENCOUNTER — Ambulatory Visit (HOSPITAL_COMMUNITY)
Admission: RE | Admit: 2022-05-01 | Discharge: 2022-05-01 | Disposition: A | Discharge status: Home / Self Care | Payer: 59 | Source: Ambulatory Visit | Attending: Vascular Surgery | Admitting: Vascular Surgery

## 2022-05-01 ENCOUNTER — Encounter (HOSPITAL_COMMUNITY)
Admission: RE | Disposition: A | Discharge status: Home / Self Care | Payer: Self-pay | Source: Ambulatory Visit | Attending: Vascular Surgery

## 2022-05-01 DIAGNOSIS — N186 End stage renal disease: Secondary | ICD-10-CM | POA: Insufficient documentation

## 2022-05-01 DIAGNOSIS — N185 Chronic kidney disease, stage 5: Secondary | ICD-10-CM

## 2022-05-01 HISTORY — PX: REMOVAL OF A DIALYSIS CATHETER: SHX6053

## 2022-05-01 SURGERY — REMOVAL, DIALYSIS CATHETER
Anesthesia: LOCAL

## 2022-05-01 MED ORDER — LIDOCAINE HCL (PF) 1 % IJ SOLN
INTRAMUSCULAR | Status: AC
  Filled 2022-05-01: qty 30

## 2022-05-01 SURGICAL SUPPLY — 23 items
ADH SKN CLS APL DERMABOND .7 (GAUZE/BANDAGES/DRESSINGS) ×1
APL PRP STRL LF ISPRP CHG 10.5 (MISCELLANEOUS) ×1
APPLICATOR CHLORAPREP 10.5 ORG (MISCELLANEOUS) ×2 IMPLANT
CLOTH BEACON ORANGE TIMEOUT ST (SAFETY) ×2 IMPLANT
CUFF ARTHROPATHY 48X23MM (Orthopedic Implant) ×1 IMPLANT
DECANTER SPIKE VIAL GLASS SM (MISCELLANEOUS) ×2 IMPLANT
DERMABOND ADVANCED (GAUZE/BANDAGES/DRESSINGS) ×1
DERMABOND ADVANCED .7 DNX12 (GAUZE/BANDAGES/DRESSINGS) ×1 IMPLANT
DRAPE HALF SHEET 40X57 (DRAPES) IMPLANT
ELECT REM PT RETURN 9FT ADLT (ELECTROSURGICAL) ×2
ELECTRODE REM PT RTRN 9FT ADLT (ELECTROSURGICAL) ×1 IMPLANT
GLOVE BIOGEL PI IND STRL 7.0 (GLOVE) ×2 IMPLANT
GLOVE BIOGEL PI INDICATOR 7.0 (GLOVE) ×2
GOWN STRL REUS W/TWL LRG LVL3 (GOWN DISPOSABLE) IMPLANT
NDL HYPO 25X1 1.5 SAFETY (NEEDLE) ×1 IMPLANT
NEEDLE HYPO 25X1 1.5 SAFETY (NEEDLE) ×2 IMPLANT
PENCIL SMOKE EVACUATOR COATED (MISCELLANEOUS) IMPLANT
SPONGE GAUZE 2X2 8PLY STRL LF (GAUZE/BANDAGES/DRESSINGS) ×2 IMPLANT
SUT MNCRL AB 4-0 PS2 18 (SUTURE) ×2 IMPLANT
SUT VIC AB 3-0 SH 27 (SUTURE) ×2
SUT VIC AB 3-0 SH 27X BRD (SUTURE) ×1 IMPLANT
SYR CONTROL 10ML LL (SYRINGE) ×2 IMPLANT
TOWEL OR 17X26 4PK STRL BLUE (TOWEL DISPOSABLE) ×2 IMPLANT

## 2022-05-01 NOTE — H&P (Signed)
   [facility] [place] [facility]  Patient name: Kerry Underwood MRN: [id number] DOB: 07-28-1951 Sex: male  HPI: [name] is a 71 y.o. male here today for removal of right IJ tunneled hemodialysis catheter  No current facility-administered medications for this encounter.     PHYSICAL EXAM: GENERAL: The patient is a well-nourished male, in no acute distress. The vital signs are documented above. Functioning right arm AV fistula, basilic vein transposition Right IJ catheter in place  MEDICAL ISSUES: Here for removal of right IJ catheter with functional right arm fistula   Rosetta Posner, MD FACS [facility] [place] Office Tel [phone]  Note: Portions of this report may have been transcribed using voice recognition software.  Every effort has been made to ensure accuracy; however, inadvertent computerized transcription errors may still be present.

## 2022-05-01 NOTE — Op Note (Signed)
    OPERATIVE REPORT  DATE OF SURGERY: 05/01/2022  PATIENT: Kerry Underwood, 71 y.o. male MRN: [id number]  DOB: [birth year]  PRE-OPERATIVE DIAGNOSIS: End-stage renal disease  POST-OPERATIVE DIAGNOSIS:  Same  PROCEDURE: Removal of right IJ tunneled catheter  SURGEON:  Curt Jews, M.D.  PHYSICIAN ASSISTANT: Nurse  The assistant was needed for exposure and to expedite the case  ANESTHESIA: 1% lidocaine local  EBL: per anesthesia record  No intake/output data recorded.  BLOOD ADMINISTERED: none  DRAINS: none  SPECIMEN: none  COUNTS CORRECT:  YES  PATIENT DISPOSITION:  PACU - hemodynamically stable  PROCEDURE DETAILS: The patient's catheter and right neck and chest were prepped draped you sterile fashion.  Using 1% lidocaine local the area over the cuff was anesthetized.  15 blade was used to make a small incision over this area.  The cuff was completely mobilized and the catheter was removed in its entirety.  The incision was closed with a 4-0 Monocryl suture.  The patient was discharged to home   Rosetta Posner, M.D., [facility] 05/01/2022 9:40 AM  Note: Portions of this report may have been transcribed using voice recognition software.  Every effort has been made to ensure accuracy; however, inadvertent computerized transcription errors may still be present.

## 2022-06-04 NOTE — Progress Notes (Deleted)
Cardiology Office Note:    Date:  06/04/2022   ID:  Kerry Underwood, DOB Mar 25, 1951, MRN [id number]  PCP:  [name], FNP   [facility] Providers Cardiologist:  Freada Bergeron, MD {  Referring MD: [name], FNP    History of Present Illness:    Kerry Underwood is a 71 y.o. male with a hx of PAD (s/p right femoral endarterectomy and fem-pop bypass in 09/2021) HTN, COPD, ESRD and tobacco use who presents to clinic for follow-up.  Patient was admitted to [facility] in 11/2021 for acute hypoxic respiratory failure as he had been evaluated by his PCP and hypoxic at 60% on room air. He was found to be positive for influenza A and also to have a COPD exacerbation. Cardiology was consulted as his Hs Troponin values peaked at 546 but EKG was without acute ST changes.  His enzyme elevation was felt to be secondary to demand ischemia in the setting of his acute illness. A follow-up echocardiogram was obtained and showed a preserved EF of 60 to 65% with no regional wall motion abnormalities. He did have grade 2 diastolic dysfunction, normal RV function, mildly elevated PASP of 39 mmHg, mild MR, mild MS, and moderate aortic valve stenosis. He was continued on ASA 81 mg daily, Plavix 75 mg daily and Lipitor 40 mg daily with Lopressor being transitioned to Toprol-XL 1m daily.   Was last seen by BBernerd Pho PSurpriseon 12/2021 where he was doing much better. Breathing improved. No chest pain.   Today, ***  Past Medical History:  Diagnosis Date   Anemia    CHF (congestive heart failure) (HCC)    COPD (chronic obstructive pulmonary disease) ([place]    Full dentures    Hypertension    Pneumonia    Pyelonephritis 09/11/2017   Renal disorder    Stroke ([facility]    " A few mini strokes, I didn't know it "    Past Surgical History:  Procedure Laterality Date   ABDOMINAL AORTOGRAM W/LOWER EXTREMITY Bilateral 09/20/2021   Procedure: ABDOMINAL AORTOGRAM W/LOWER EXTREMITY;  Surgeon: RBroadus John MD;  [facility];  Service: Cardiovascular;  Laterality: Bilateral;   AMPUTATION TOE Right 09/22/2021   Procedure: PARTIAL AMPUTATION SECOND RIGHT TOE;  Surgeon: CMarty Heck MD;  [place]  Service: Vascular;  Laterality: Right;   [place] 04/19/2020   Procedure: Basilic Vein Transposition Right Arm;  Surgeon: CWaynetta Sandy MD;  [place]  Service: Vascular;  Laterality: Right;   [place] 02/13/2022   Procedure: RIGHT ARM SECOND STAGE BASILIC VEIN TRANSPOSITION;  Surgeon: ERosetta Posner MD;  Location: [facility];  Service: Vascular;  Laterality: Right;   COLON SURGERY     ENDARTERECTOMY FEMORAL Right 09/22/2021   Procedure: ENDARTERECTOMY FEMORAL WITH PATCH ANGIOPLASTY USING 1X6CM XWeyman Pedro  Surgeon: CMarty Heck MD;  [place]  Service: Vascular;  Laterality: Right;   FEMORAL-TIBIAL BYPASS GRAFT Right 09/22/2021   Procedure: RIGHT FEMORAL TO BELOW KNEE POPLITEAL BYPASS WITH HARVEST OF GREATER SAPHENOUS VEIN;  Surgeon: CMarty Heck MD;  [place]  Service: Vascular;  Laterality: Right;   INSERTION OF DIALYSIS CATHETER Right 04/19/2020   Procedure: Insertion Of Right Internal Jugular Dialysis Catheter;  Surgeon: CWaynetta Sandy MD;  [place]  Service: Vascular;  Laterality: Right;   MULTIPLE TOOTH EXTRACTIONS     PERIPHERAL VASCULAR BALLOON ANGIOPLASTY Right 09/20/2021  Procedure: PERIPHERAL VASCULAR BALLOON ANGIOPLASTY;  Surgeon: Broadus John, MD;  Location: [place] [facility];  Service: Cardiovascular;  Laterality: Right;  SFA  FAILED   REMOVAL OF A DIALYSIS CATHETER N/A 05/01/2022   Procedure: MINOR REMOVAL OF A TUNNELED DIALYSIS CATHETER;  Surgeon: Rosetta Posner, MD;  Location: [facility];  Service: Vascular;  Laterality: N/A;    Current Medications: No outpatient medications have been marked as taking for the 06/14/22 encounter  (Appointment) with Freada Bergeron, MD.     Allergies:   Strawberry (diagnostic)   Social History   Socioeconomic History   Marital status: Single    Spouse name: Not on file   Number of children: Not on file   Years of education: Not on file   Highest education level: Not on file  Occupational History   Occupation: retired  Tobacco Use   Smoking status: Every Day    Packs/day: 0.50    Years: 62.00    Total pack years: 31.00    Types: Cigarettes   Smokeless tobacco: Former    Types: Chew   Tobacco comments:    Started smoking at age 52 - rolls his own tobacco cigarettes   Vaping Use   Vaping Use: Never used  Substance and Sexual Activity   Alcohol use: No   Drug use: Yes    Types: Marijuana   Sexual activity: Not Currently    Birth control/protection: None  Other Topics Concern   Not on file  Social History Narrative   Lives alone. No family nearby. He does get out and socialize with friends.    Doesn't have  a good relationship with his children   Social Determinants of Health   Financial Resource Strain: Medium Risk (11/22/2021)   Overall Financial Resource Strain (CARDIA)    Difficulty of Paying Living Expenses: Somewhat hard  Food Insecurity: No Food Insecurity (11/22/2021)   Hunger Vital Sign    Worried About Running Out of Food in the Last Year: Never true    Ran Out of Food in the Last Year: Never true  Transportation Needs: No Transportation Needs (11/22/2021)   PRAPARE - Hydrologist (Medical): No    Lack of Transportation (Non-Medical): No  Physical Activity: Sufficiently Active (11/22/2021)   Exercise Vital Sign    Days of Exercise per Week: 5 days    Minutes of Exercise per Session: 30 min  Stress: No Stress Concern Present (11/22/2021)   [place]    Feeling of Stress : Not at all  Social Connections: Socially Isolated (11/22/2021)   Social  Connection and Isolation Panel [NHANES]    Frequency of Communication with Friends and Family: More than three times a week    Frequency of Social Gatherings with Friends and Family: More than three times a week    Attends Religious Services: Never    Marine scientist or Organizations: No    Attends Archivist Meetings: Never    Marital Status: Never married     Family History: The patient's ***family history includes Alzheimer's disease in his mother.  ROS:   Please see the history of present illness.    *** All other systems reviewed and are negative.  EKGs/Labs/Other Studies Reviewed:    The following studies were reviewed today: Echocardiogram: 11/25/2021 IMPRESSIONS     1. Left ventricular ejection fraction, by estimation, is 60 to 65%. The  left ventricle  has normal function. The left ventricle has no regional  wall motion abnormalities. There is mild left ventricular hypertrophy.  Left ventricular diastolic parameters  are consistent with Grade II diastolic dysfunction (pseudonormalization).   2. Right ventricular systolic function is normal. The right ventricular  size is normal. There is mildly elevated pulmonary artery systolic  pressure. The estimated right ventricular systolic pressure is 73.4 mmHg.   3. Left atrial size was mildly dilated.   4. The mitral valve is degenerative. Mild mitral valve regurgitation.  Mild calcific mitral stenosis. Mean gradient 15mHg at HR 71; MVA 1.9cm2 by  continuity. Severe mitral annular calcification.   5. The aortic valve is calcified. There is moderate-to-severe  calcification of the aortic valve. There is moderate-to-severe thickening  of the aortic valve. Aortic valve regurgitation is not visualized.  Moderate aortic valve stenosis. AVA 0.91cm2, mean  gradient 222mg, peak gradient 4571m, Vmax 3.21m/48mDI 0.3.   6. The inferior vena cava is dilated in size with >50% respiratory  variability, suggesting right  atrial pressure of 8 mmHg.   Comparison(s): No prior Echocardiogram.   EKG:  EKG is *** ordered today.  The ekg ordered today demonstrates ***  Recent Labs: 06/29/2021: B Natriuretic Peptide 287.0; Magnesium 2.0 08/23/2021: TSH 2.990 09/18/2021: ALT 18 11/28/2021: Platelets 140 02/13/2022: BUN 31; Creatinine, Ser 6.60; Hemoglobin 14.6; Potassium 3.8; Sodium 136  Recent Lipid Panel    Component Value Date/Time   CHOL 91 09/23/2021 0427   CHOL 146 08/23/2021 1208   TRIG 91 09/23/2021 0427   HDL 19 (L) 09/23/2021 0427   HDL 30 (L) 08/23/2021 1208   CHOLHDL 4.8 09/23/2021 0427   VLDL 18 09/23/2021 0427   LDLCALC 54 09/23/2021 0427   LDLCALC 90 08/23/2021 1208     Risk Assessment/Calculations:   {Does this patient have ATRIAL FIBRILLATION?:[phone]}       Physical Exam:    VS:  There were no vitals taken for this visit.    Wt Readings from Last 3 Encounters:  02/09/22 170 lb (77.1 kg)  01/31/22 186 lb 2 oz (84.4 kg)  12/20/21 184 lb (83.5 kg)     GEN: *** Well nourished, well developed in no acute distress HEENT: Normal NECK: No JVD; No carotid bruits LYMPHATICS: No lymphadenopathy CARDIAC: ***RRR, no murmurs, rubs, gallops RESPIRATORY:  Clear to auscultation without rales, wheezing or rhonchi  ABDOMEN: Soft, non-tender, non-distended MUSCULOSKELETAL:  No edema; No deformity  SKIN: Warm and dry NEUROLOGIC:  Alert and oriented x 3 PSYCHIATRIC:  Normal affect   ASSESSMENT:    No diagnosis found. PLAN:    In order of problems listed above:  #Elevated Troponin: Occurred in 11/2021 in the setting of influenza A infection and COPD exacerbation. Likely secondary to demand ischemia in the setting of acute illness. TTE 11/25/21 with normal EF, no WMA, G2DD, severe MAC and moderate AS.  Currently, he is active without any anginal symptoms. Declined any further testing at this time.  -Continue ASA 81mg19mly, plavix 75mg 721my -Continue lipitor 40mg d79m -Continue 25mg  X43mily   #Moderate AS: AVA 0.91cm2, mean gradient 27mmHg, 38m gradient 45mmHg, V78m3.21m/s, DI 025m Will need continued surveillance as out-patient. -Repeat TTE 11/2022 for monitoring   #PAD: -Continue ASA, plavix and lipitor as above   #ESRD: -On HD T, Th, S   #HTN: -Continue home amlodipine 5mg daily -65mop 25mg XL dail321m#HLD: -Continue lipitor 40mg daily as72mve        {Are you  ordering a CV Procedure (e.g. stress test, cath, DCCV, TEE, etc)?   Press F2        :[id number]}    Medication Adjustments/Labs and Tests Ordered: Current medicines are reviewed at length with the patient today.  Concerns regarding medicines are outlined above.  No orders of the defined types were placed in this encounter.  No orders of the defined types were placed in this encounter.   There are no Patient Instructions on file for this visit.   Signed, Freada Bergeron, MD  06/04/2022 4:20 PM    [facility]

## 2022-06-14 ENCOUNTER — Ambulatory Visit: Payer: 59 | Admitting: Cardiology

## 2022-08-06 IMAGING — DX DG CHEST 1V PORT
1 series · 1 of 1 positions shown · non-contrast
Comparison: Chest x-ray 08/23/2021.

CLINICAL DATA: Chest pain with shortness of breath.

EXAM:
PORTABLE CHEST 1 VIEW

[chest ap]
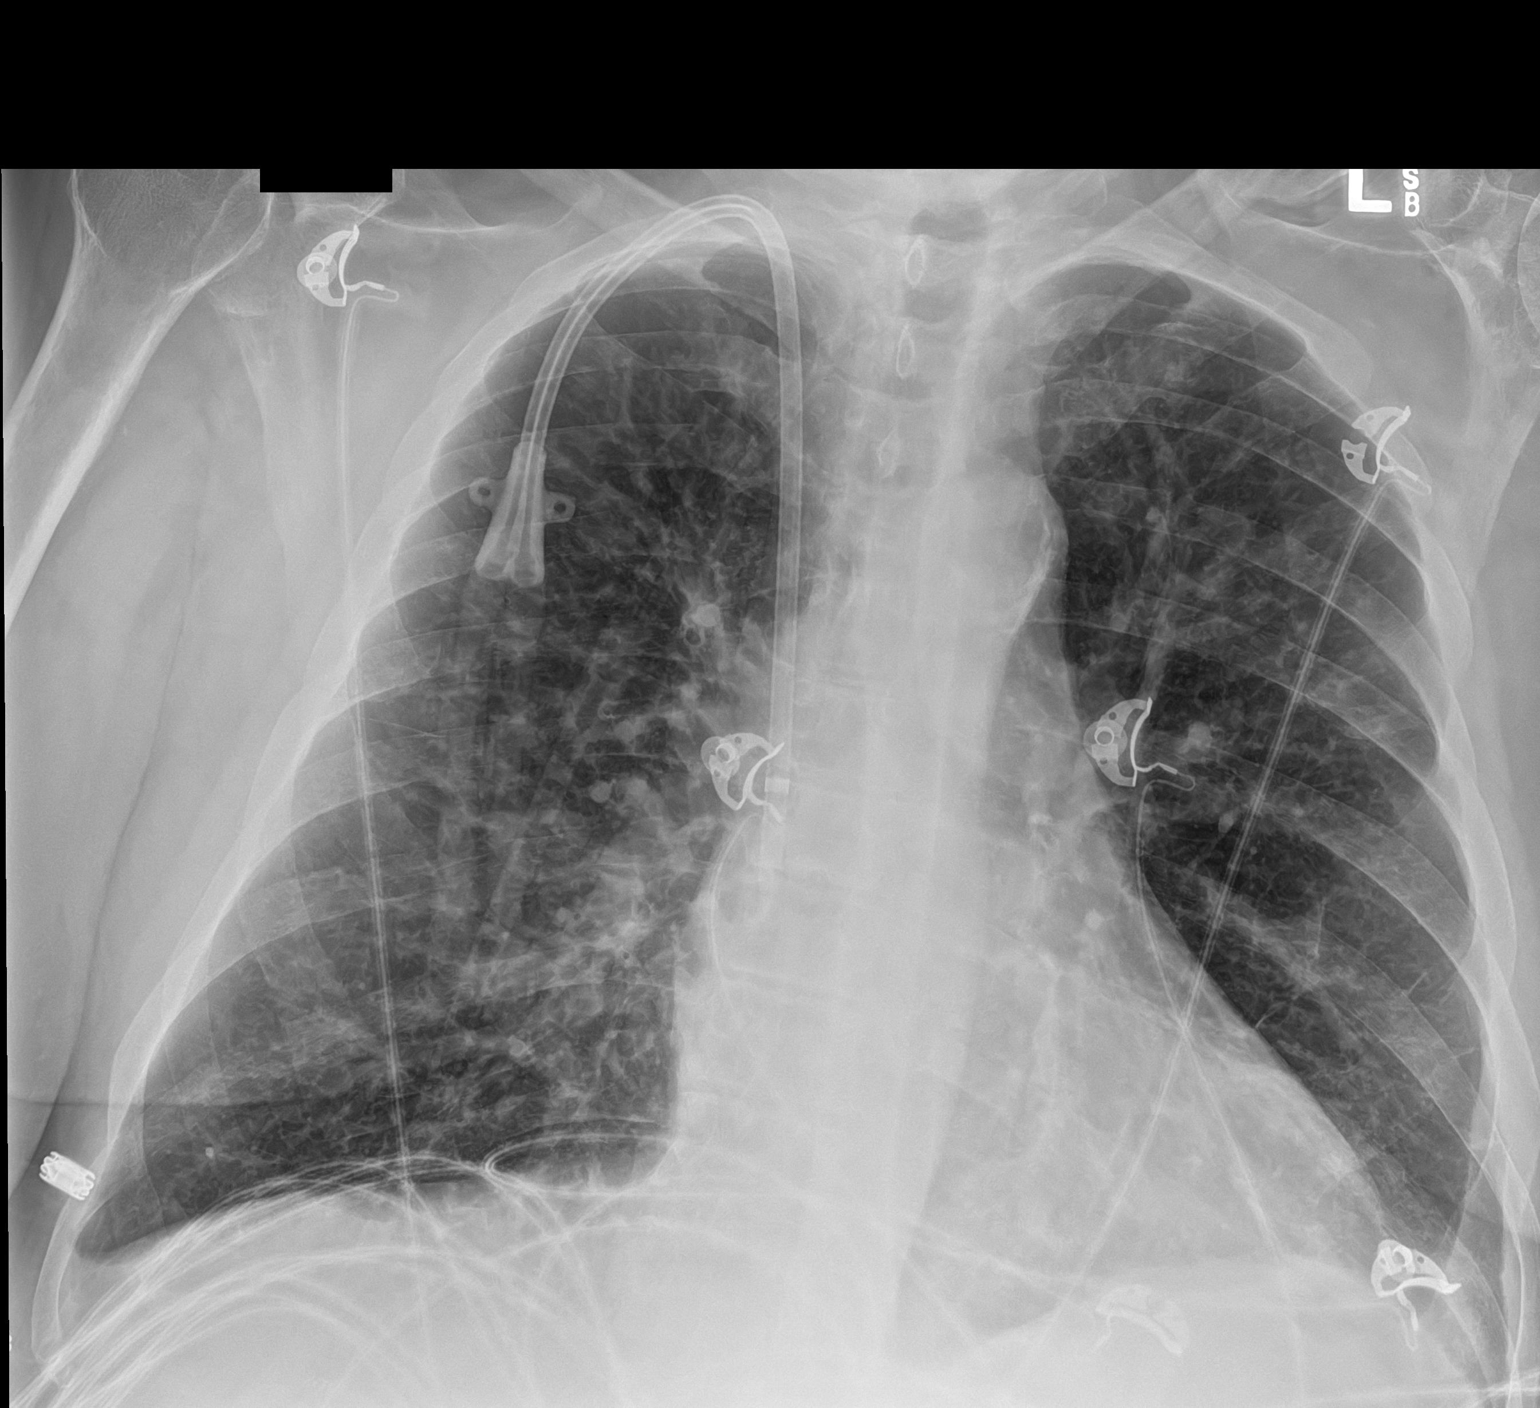

[1 of 1 positions shown; findings below may reference images not displayed]

FINDINGS: The heart is mildly enlarged, unchanged. There is pulmonary vascular
congestion. There is a small right pleural effusion. No
pneumothorax. Right-sided central venous catheter tip projects over
the mid SVC. There is a healed left clavicular fracture.
IMPRESSION: 1. Cardiomegaly with central pulmonary vascular congestion.
2. Small right pleural effusion.

## 2022-08-08 IMAGING — DX DG CHEST 1V PORT
1 series · 1 of 1 positions shown · non-contrast
Comparison: 11/24/2021 and older studies.

CLINICAL DATA: Respiratory failure with hypoxia.  Follow-up exam.

EXAM:
PORTABLE CHEST 1 VIEW

[chest]
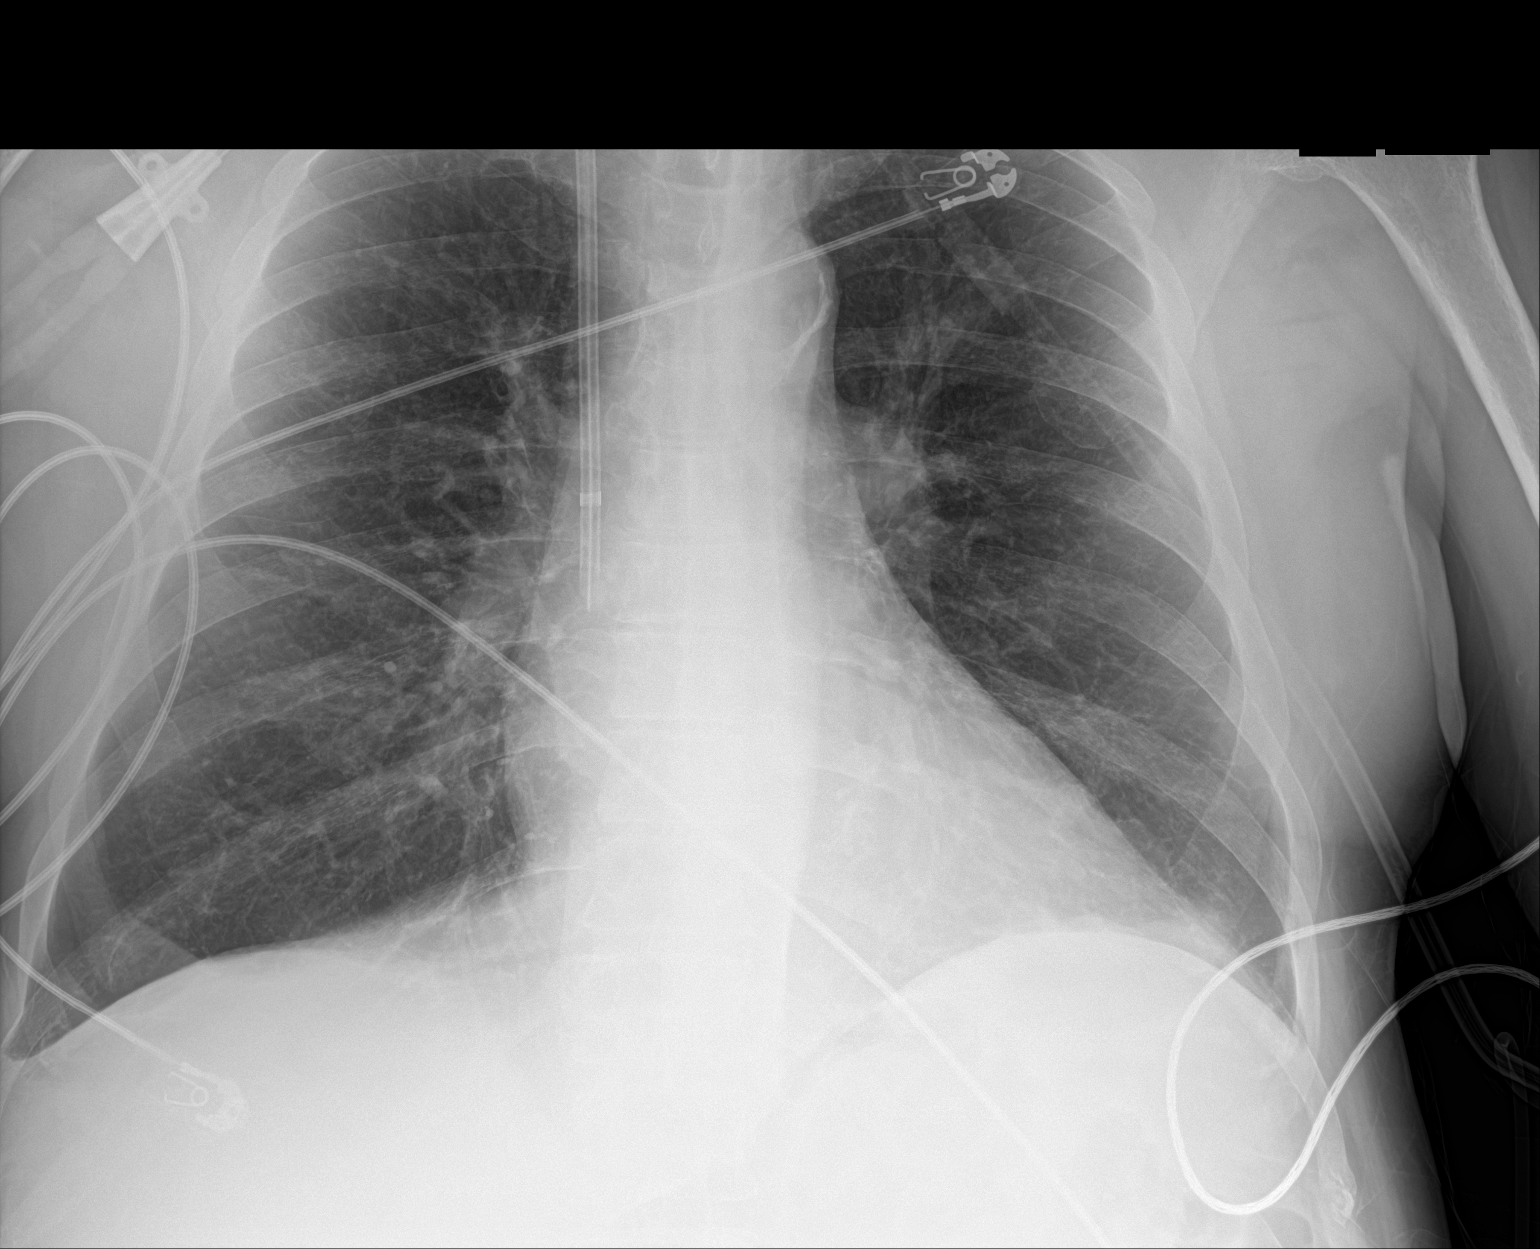

[1 of 1 positions shown; findings below may reference images not displayed]

FINDINGS: Cardiac silhouette normal in size.  No mediastinal or hilar masses.

Lungs are hyperexpanded. No residual vascular congestion. Minor
opacity at the left lung base consistent with atelectasis or
scarring. Lungs otherwise clear.

No convincing pleural effusion and no pneumothorax.

Stable right internal jugular dual lumen tunneled central venous
catheter.
IMPRESSION: 1. No acute cardiopulmonary disease.

## 2022-09-11 ENCOUNTER — Other Ambulatory Visit: Payer: Self-pay | Admitting: *Deleted

## 2022-09-11 DIAGNOSIS — N186 End stage renal disease: Secondary | ICD-10-CM

## 2022-09-12 ENCOUNTER — Ambulatory Visit (INDEPENDENT_AMBULATORY_CARE_PROVIDER_SITE_OTHER): Payer: 59 | Admitting: Vascular Surgery

## 2022-09-12 ENCOUNTER — Encounter: Payer: Self-pay | Admitting: Vascular Surgery

## 2022-09-12 ENCOUNTER — Ambulatory Visit (INDEPENDENT_AMBULATORY_CARE_PROVIDER_SITE_OTHER): Payer: 59

## 2022-09-12 VITALS — BP 151/76 | HR 81 | Temp 98.9°F | Ht 65.0 in | Wt 186.2 lb

## 2022-09-12 DIAGNOSIS — N186 End stage renal disease: Secondary | ICD-10-CM | POA: Diagnosis not present

## 2022-09-12 DIAGNOSIS — Z992 Dependence on renal dialysis: Secondary | ICD-10-CM

## 2022-09-12 NOTE — Progress Notes (Signed)
[facility] [place]  Patient name: Kerry Underwood MRN: [id number] DOB: [birth year] Sex: male  REASON FOR VISIT: Evaluate steal symptoms right hand  HPI: Kerry Underwood is a 71 y.o. male here today for follow-up.  He initially went for stage right brachiobasilic fistula creation in May 2021.  He reports that he began sensing coldness in his right hand following his initial procedure.  There was a great delay between for stage and second stage transposition.  He underwent transposition by myself on 02/13/2022.  He currently dialyzes via his right brachiobasilic fistula.  He reports that while on dialysis his hand becomes cold.  He reports that if he holds his arm and hand dependent that this improves this.  He reports that that was occurring while he was being dialyzed via his catheter as well.  He denies any tissue loss.  He does not report any pain in his hand.  He does not have any weakness in his hand.  Past Medical History:  Diagnosis Date   Anemia    CHF (congestive heart failure) (HCC)    COPD (chronic obstructive pulmonary disease) (HCC)    Full dentures    Hypertension    Pneumonia    Pyelonephritis 09/11/2017   Renal disorder    Stroke [facility])    " A few mini strokes, I didn't know it "    Family History  Problem Relation Age of Onset   Alzheimer's disease Mother     SOCIAL HISTORY: Social History   Tobacco Use   Smoking status: Every Day    Packs/day: 0.50    Years: 62.00    Total pack years: 31.00    Types: Cigarettes   Smokeless tobacco: Former    Types: Chew   Tobacco comments:    Started smoking at age 66 - rolls his own tobacco cigarettes   Substance Use Topics   Alcohol use: No    Allergies  Allergen Reactions   Strawberry (Diagnostic) Rash    Current Outpatient Medications  Medication Sig Dispense Refill   albuterol (VENTOLIN HFA) 108 (90 Base) MCG/ACT inhaler Inhale 2 puffs into the lungs every 4  (four) hours as needed for wheezing or shortness of breath. 18 g 0   amLODipine (NORVASC) 5 MG tablet Take 5 mg by mouth at bedtime.     aspirin EC 325 MG tablet Take by mouth.     B Complex-C-Folic Acid (DIALYVITE 388) 0.8 MG TABS Take by mouth.     Cholecalciferol 125 MCG (5000 UT) TABS Take by mouth.     clopidogrel (PLAVIX) 75 MG tablet Take 1 tablet (75 mg total) by mouth daily with breakfast. 30 tablet 1   fluticasone furoate-vilanterol (BREO ELLIPTA) 200-25 MCG/INH AEPB Inhale 1 puff into the lungs daily. 30 each 1   metoprolol succinate (TOPROL XL) 25 MG 24 hr tablet Take 1 tablet (25 mg total) by mouth daily. 90 tablet 3   sucroferric oxyhydroxide (VELPHORO) 500 MG chewable tablet Chew 1,000 mg by mouth 3 (three) times daily with meals.     tamsulosin (FLOMAX) 0.4 MG CAPS capsule Take 0.4 mg by mouth at bedtime.     tiotropium (SPIRIVA HANDIHALER) 18 MCG inhalation capsule Place 1 capsule (18 mcg total) into inhaler and inhale daily. 30 capsule 1   atorvastatin (LIPITOR) 40 MG tablet Take 1 tablet (40 mg total) by mouth daily. (Patient not taking: Reported on 09/12/2022) 30 tablet 1   heparin 1000 unit/mL SOLN injection Heparin  Sodium (Porcine) 1,000 Units/mL Catheter Lock Arterial     Methoxy PEG-Epoetin Beta (MIRCERA IJ) Mircera     oxyCODONE-acetaminophen (PERCOCET) 5-325 MG tablet Take 1 tablet by mouth every 6 (six) hours as needed for severe pain. (Patient not taking: Reported on 09/12/2022) 8 tablet 0   No current facility-administered medications for this visit.    REVIEW OF SYSTEMS:  [X]  denotes positive finding, [ ]  denotes negative finding Cardiac  Comments:  Chest pain or chest pressure:    Shortness of breath upon exertion:    Short of breath when lying flat:    Irregular heart rhythm:        Vascular    Pain in calf, thigh, or hip brought on by ambulation:    Pain in feet at night that wakes you up from your sleep:     Blood clot in your veins:    Leg swelling:            PHYSICAL EXAM: Vitals:   09/12/22 1333  BP: (!) 151/76  Pulse: 81  Temp: 98.9 F (37.2 C)  SpO2: 97%  Weight: 186 lb 3.2 oz (84.5 kg)  Height: 5\' 5"  (1.651 m)    GENERAL: The patient is a well-nourished male, in no acute distress. The vital signs are documented above. CARDIOVASCULAR: Diminished radial pulse right arm which improves with compression of the fistula.  He does have a very nicely developed right brachiobasilic transposed fistula.  The vein is of very nice caliber and runs in a straight course and is superficial under the skin. PULMONARY: There is good air exchange  MUSCULOSKELETAL: There are no major deformities or cyanosis. NEUROLOGIC: No focal weakness or paresthesias are detected. SKIN: There are no ulcers or rashes noted. PSYCHIATRIC: The patient has a normal affect.  DATA:  Noninvasive studies today reveal absent waveforms in the finger with the fistula patent and return of near normal waveform with the fistula compressed  MEDICAL ISSUES: Long discussion with patient regarding his steal symptoms.  He reports that he is able to tolerate this.  He reports that his hand is cold on dialysis, he wrapped a blanket around around this and it is tolerable.  I did discuss symptoms of progression with either numbness or weakness or tissue loss and he knows to notify us immediately should this occur.  Also discussed options for treatment of his steal to include ligation of the fistula versus banding versusDRIL procedure.  We will continue observation and he will notify should he develop progressive symptoms    Rosetta Posner, MD FACS [facility] [place] Office Tel [phone]  Note: Portions of this report may have been transcribed using voice recognition software.  Every effort has been made to ensure accuracy; however, inadvertent computerized transcription errors may still be present.

## 2022-10-10 ENCOUNTER — Ambulatory Visit: Payer: 59 | Admitting: Vascular Surgery

## 2022-10-13 ENCOUNTER — Emergency Department (HOSPITAL_COMMUNITY): Payer: 59

## 2022-10-13 ENCOUNTER — Other Ambulatory Visit: Payer: Self-pay

## 2022-10-13 ENCOUNTER — Inpatient Hospital Stay (HOSPITAL_COMMUNITY): Payer: 59

## 2022-10-13 ENCOUNTER — Observation Stay (HOSPITAL_COMMUNITY)
Admission: EM | Admit: 2022-10-13 | Discharge: 2022-10-14 | Disposition: A | Discharge status: Home / Self Care | Payer: 59 | Attending: Internal Medicine | Admitting: Internal Medicine

## 2022-10-13 ENCOUNTER — Encounter (HOSPITAL_COMMUNITY): Payer: Self-pay | Admitting: *Deleted

## 2022-10-13 DIAGNOSIS — I5033 Acute on chronic diastolic (congestive) heart failure: Secondary | ICD-10-CM | POA: Insufficient documentation

## 2022-10-13 DIAGNOSIS — Z8673 Personal history of transient ischemic attack (TIA), and cerebral infarction without residual deficits: Secondary | ICD-10-CM | POA: Diagnosis not present

## 2022-10-13 DIAGNOSIS — R0602 Shortness of breath: Secondary | ICD-10-CM | POA: Diagnosis present

## 2022-10-13 DIAGNOSIS — D631 Anemia in chronic kidney disease: Secondary | ICD-10-CM | POA: Diagnosis not present

## 2022-10-13 DIAGNOSIS — J9601 Acute respiratory failure with hypoxia: Principal | ICD-10-CM | POA: Diagnosis present

## 2022-10-13 DIAGNOSIS — I132 Hypertensive heart and chronic kidney disease with heart failure and with stage 5 chronic kidney disease, or end stage renal disease: Secondary | ICD-10-CM | POA: Diagnosis not present

## 2022-10-13 DIAGNOSIS — Z79899 Other long term (current) drug therapy: Secondary | ICD-10-CM | POA: Diagnosis not present

## 2022-10-13 DIAGNOSIS — Z720 Tobacco use: Secondary | ICD-10-CM

## 2022-10-13 DIAGNOSIS — Z8679 Personal history of other diseases of the circulatory system: Secondary | ICD-10-CM | POA: Diagnosis not present

## 2022-10-13 DIAGNOSIS — Z7951 Long term (current) use of inhaled steroids: Secondary | ICD-10-CM | POA: Diagnosis not present

## 2022-10-13 DIAGNOSIS — Z992 Dependence on renal dialysis: Secondary | ICD-10-CM | POA: Diagnosis not present

## 2022-10-13 DIAGNOSIS — N401 Enlarged prostate with lower urinary tract symptoms: Secondary | ICD-10-CM | POA: Diagnosis present

## 2022-10-13 DIAGNOSIS — N186 End stage renal disease: Secondary | ICD-10-CM

## 2022-10-13 DIAGNOSIS — J441 Chronic obstructive pulmonary disease with (acute) exacerbation: Secondary | ICD-10-CM | POA: Diagnosis present

## 2022-10-13 DIAGNOSIS — F17210 Nicotine dependence, cigarettes, uncomplicated: Secondary | ICD-10-CM | POA: Diagnosis not present

## 2022-10-13 DIAGNOSIS — Z7982 Long term (current) use of aspirin: Secondary | ICD-10-CM | POA: Diagnosis not present

## 2022-10-13 DIAGNOSIS — Z1152 Encounter for screening for COVID-19: Secondary | ICD-10-CM | POA: Insufficient documentation

## 2022-10-13 DIAGNOSIS — I151 Hypertension secondary to other renal disorders: Secondary | ICD-10-CM

## 2022-10-13 DIAGNOSIS — I739 Peripheral vascular disease, unspecified: Secondary | ICD-10-CM | POA: Diagnosis present

## 2022-10-13 HISTORY — DX: Dependence on renal dialysis: Z99.2

## 2022-10-13 LAB — CBC WITH DIFFERENTIAL/PLATELET
Abs Immature Granulocytes: 0.04 10*3/uL (ref 0.00–0.07)
Basophils Absolute: 0 10*3/uL (ref 0.0–0.1)
Basophils Relative: 0 %
Eosinophils Absolute: 0.1 10*3/uL (ref 0.0–0.5)
Eosinophils Relative: 1 %
HCT: 33.4 % — ABNORMAL LOW (ref 39.0–52.0)
Hemoglobin: 11.1 g/dL — ABNORMAL LOW (ref 13.0–17.0)
Immature Granulocytes: 0 %
Lymphocytes Relative: 5 %
Lymphs Abs: 0.6 10*3/uL — ABNORMAL LOW (ref 0.7–4.0)
MCH: 32.9 pg (ref 26.0–34.0)
MCHC: 33.2 g/dL (ref 30.0–36.0)
MCV: 99.1 fL (ref 80.0–100.0)
Monocytes Absolute: 0.5 10*3/uL (ref 0.1–1.0)
Monocytes Relative: 5 %
Neutro Abs: 10.2 10*3/uL — ABNORMAL HIGH (ref 1.7–7.7)
Neutrophils Relative %: 89 %
Platelets: 235 10*3/uL (ref 150–400)
RBC: 3.37 MIL/uL — ABNORMAL LOW (ref 4.22–5.81)
RDW: 14.4 % (ref 11.5–15.5)
WBC: 11.5 10*3/uL — ABNORMAL HIGH (ref 4.0–10.5)
nRBC: 0 % (ref 0.0–0.2)

## 2022-10-13 LAB — RESP PANEL BY RT-PCR (FLU A&B, COVID) ARPGX2
Influenza A by PCR: NEGATIVE
Influenza B by PCR: NEGATIVE
SARS Coronavirus 2 by RT PCR: NEGATIVE

## 2022-10-13 LAB — BASIC METABOLIC PANEL
Anion gap: 16 — ABNORMAL HIGH (ref 5–15)
BUN: 51 mg/dL — ABNORMAL HIGH (ref 8–23)
CO2: 24 mmol/L (ref 22–32)
Calcium: 8.4 mg/dL — ABNORMAL LOW (ref 8.9–10.3)
Chloride: 96 mmol/L — ABNORMAL LOW (ref 98–111)
Creatinine, Ser: 12.34 mg/dL — ABNORMAL HIGH (ref 0.61–1.24)
GFR, Estimated: 4 mL/min — ABNORMAL LOW (ref >60)
Glucose, Bld: 121 mg/dL — ABNORMAL HIGH (ref 70–99)
Potassium: 4.6 mmol/L (ref 3.5–5.1)
Sodium: 136 mmol/L (ref 135–145)

## 2022-10-13 LAB — ECHOCARDIOGRAM COMPLETE
AR max vel: 1.05 cm2
AV Area VTI: 0.99 cm2
AV Area mean vel: 0.91 cm2
AV Mean grad: 35.5 mmHg
AV Peak grad: 59.1 mmHg
Ao pk vel: 3.85 m/s
Area-P 1/2: 3.93 cm2
Height: 65 in
MV M vel: 6.24 m/s
MV Peak grad: 155.8 mmHg
MV VTI: 1.65 cm2
Radius: 0.6 cm
S' Lateral: 3.2 cm
Weight: 2948.87 oz

## 2022-10-13 LAB — HEPATITIS B SURFACE ANTIGEN: Hepatitis B Surface Ag: NONREACTIVE

## 2022-10-13 LAB — BRAIN NATRIURETIC PEPTIDE: B Natriuretic Peptide: 2290 pg/mL — ABNORMAL HIGH (ref 0.0–100.0)

## 2022-10-13 MED ORDER — IPRATROPIUM-ALBUTEROL 0.5-2.5 (3) MG/3ML IN SOLN
3.0000 mL | Freq: Once | RESPIRATORY_TRACT | Status: AC
  Administered 2022-10-13: 3 mL via RESPIRATORY_TRACT
  Filled 2022-10-13: qty 3

## 2022-10-13 MED ORDER — LIDOCAINE HCL (PF) 1 % IJ SOLN: 5.0000 mL | INTRAMUSCULAR | Status: DC | PRN

## 2022-10-13 MED ORDER — SODIUM CHLORIDE 0.9% FLUSH
3.0000 mL | Freq: Two times a day (BID) | INTRAVENOUS | Status: DC
  Administered 2022-10-13 – 2022-10-14 (×3): 3 mL via INTRAVENOUS

## 2022-10-13 MED ORDER — AMLODIPINE BESYLATE 5 MG PO TABS
5.0000 mg | ORAL_TABLET | Freq: Every day | ORAL | Status: DC
  Administered 2022-10-13: 5 mg via ORAL
  Filled 2022-10-13: qty 1

## 2022-10-13 MED ORDER — FUROSEMIDE 10 MG/ML IJ SOLN
40.0000 mg | Freq: Once | INTRAMUSCULAR | Status: AC
  Administered 2022-10-13: 40 mg via INTRAVENOUS
  Filled 2022-10-13: qty 4

## 2022-10-13 MED ORDER — SODIUM CHLORIDE 0.9% FLUSH: 3.0000 mL | INTRAVENOUS | Status: DC | PRN

## 2022-10-13 MED ORDER — PENTAFLUOROPROP-TETRAFLUOROETH EX AERO: 1.0000 | INHALATION_SPRAY | CUTANEOUS | Status: DC | PRN

## 2022-10-13 MED ORDER — METHYLPREDNISOLONE SODIUM SUCC 40 MG IJ SOLR
40.0000 mg | Freq: Two times a day (BID) | INTRAMUSCULAR | Status: DC
  Administered 2022-10-13 – 2022-10-14 (×2): 40 mg via INTRAVENOUS
  Filled 2022-10-13 (×2): qty 1

## 2022-10-13 MED ORDER — CHLORHEXIDINE GLUCONATE CLOTH 2 % EX PADS
6.0000 | MEDICATED_PAD | Freq: Every day | CUTANEOUS | Status: DC
  Administered 2022-10-13 – 2022-10-14 (×2): 6 via TOPICAL

## 2022-10-13 MED ORDER — ACETAMINOPHEN 650 MG RE SUPP: 650.0000 mg | Freq: Four times a day (QID) | RECTAL | Status: DC | PRN

## 2022-10-13 MED ORDER — HEPARIN SODIUM (PORCINE) 1000 UNIT/ML DIALYSIS
5000.0000 [IU] | Freq: Once | INTRAMUSCULAR | Status: AC
  Filled 2022-10-13: qty 5

## 2022-10-13 MED ORDER — METHYLPREDNISOLONE SODIUM SUCC 125 MG IJ SOLR
125.0000 mg | Freq: Once | INTRAMUSCULAR | Status: AC
  Administered 2022-10-13: 125 mg via INTRAVENOUS
  Filled 2022-10-13: qty 2

## 2022-10-13 MED ORDER — IPRATROPIUM-ALBUTEROL 0.5-2.5 (3) MG/3ML IN SOLN
3.0000 mL | Freq: Four times a day (QID) | RESPIRATORY_TRACT | Status: DC
  Administered 2022-10-13 – 2022-10-14 (×2): 3 mL via RESPIRATORY_TRACT
  Filled 2022-10-13 (×3): qty 3

## 2022-10-13 MED ORDER — ACETAMINOPHEN 325 MG PO TABS: 650.0000 mg | ORAL_TABLET | Freq: Four times a day (QID) | ORAL | Status: DC | PRN

## 2022-10-13 MED ORDER — METOPROLOL SUCCINATE ER 25 MG PO TB24
25.0000 mg | ORAL_TABLET | Freq: Every day | ORAL | Status: DC
  Administered 2022-10-13 – 2022-10-14 (×2): 25 mg via ORAL
  Filled 2022-10-13 (×2): qty 1

## 2022-10-13 MED ORDER — BUDESONIDE 0.25 MG/2ML IN SUSP
0.2500 mg | Freq: Two times a day (BID) | RESPIRATORY_TRACT | Status: DC
  Administered 2022-10-13 – 2022-10-14 (×2): 0.25 mg via RESPIRATORY_TRACT
  Filled 2022-10-13 (×2): qty 2

## 2022-10-13 MED ORDER — HEPARIN SODIUM (PORCINE) 5000 UNIT/ML IJ SOLN
5000.0000 [IU] | Freq: Three times a day (TID) | INTRAMUSCULAR | Status: DC
  Administered 2022-10-13 – 2022-10-14 (×3): 5000 [IU] via SUBCUTANEOUS
  Filled 2022-10-13 (×3): qty 1

## 2022-10-13 MED ORDER — HEPARIN SODIUM (PORCINE) 1000 UNIT/ML IJ SOLN
INTRAMUSCULAR | Status: AC
  Administered 2022-10-13: 5000 [IU] via INTRAVENOUS_CENTRAL
  Filled 2022-10-13: qty 5

## 2022-10-13 MED ORDER — ATORVASTATIN CALCIUM 40 MG PO TABS
40.0000 mg | ORAL_TABLET | Freq: Every day | ORAL | Status: DC
  Administered 2022-10-13 – 2022-10-14 (×2): 40 mg via ORAL
  Filled 2022-10-13 (×2): qty 1

## 2022-10-13 MED ORDER — CLOPIDOGREL BISULFATE 75 MG PO TABS
75.0000 mg | ORAL_TABLET | Freq: Every day | ORAL | Status: DC
  Administered 2022-10-14: 75 mg via ORAL
  Filled 2022-10-13: qty 1

## 2022-10-13 MED ORDER — ALBUTEROL SULFATE (2.5 MG/3ML) 0.083% IN NEBU
5.0000 mg | INHALATION_SOLUTION | Freq: Once | RESPIRATORY_TRACT | Status: AC
  Administered 2022-10-13: 5 mg via RESPIRATORY_TRACT
  Filled 2022-10-13: qty 6

## 2022-10-13 MED ORDER — SUCROFERRIC OXYHYDROXIDE 500 MG PO CHEW
1000.0000 mg | CHEWABLE_TABLET | Freq: Three times a day (TID) | ORAL | Status: DC
  Filled 2022-10-13 (×7): qty 2

## 2022-10-13 MED ORDER — ONDANSETRON HCL 4 MG PO TABS: 4.0000 mg | ORAL_TABLET | Freq: Four times a day (QID) | ORAL | Status: DC | PRN

## 2022-10-13 MED ORDER — SODIUM CHLORIDE 0.9 % IV SOLN: 250.0000 mL | INTRAVENOUS | Status: DC | PRN

## 2022-10-13 MED ORDER — TAMSULOSIN HCL 0.4 MG PO CAPS
0.4000 mg | ORAL_CAPSULE | Freq: Every day | ORAL | Status: DC
  Administered 2022-10-13: 0.4 mg via ORAL
  Filled 2022-10-13: qty 1

## 2022-10-13 MED ORDER — ALBUMIN HUMAN 25 % IV SOLN: 25.0000 g | INTRAVENOUS | Status: DC | PRN

## 2022-10-13 MED ORDER — ASPIRIN 325 MG PO TBEC
325.0000 mg | DELAYED_RELEASE_TABLET | Freq: Every day | ORAL | Status: DC
  Administered 2022-10-13 – 2022-10-14 (×2): 325 mg via ORAL
  Filled 2022-10-13 (×2): qty 1

## 2022-10-13 MED ORDER — ONDANSETRON HCL 4 MG/2ML IJ SOLN: 4.0000 mg | Freq: Four times a day (QID) | INTRAMUSCULAR | Status: DC | PRN

## 2022-10-13 MED ORDER — LIDOCAINE-PRILOCAINE 2.5-2.5 % EX CREA: 1.0000 | TOPICAL_CREAM | CUTANEOUS | Status: DC | PRN

## 2022-10-13 NOTE — Progress Notes (Signed)
*  PRELIMINARY RESULTS* Echocardiogram 2D Echocardiogram has been performed.  Kerry Underwood 10/13/2022, 3:18 PM

## 2022-10-13 NOTE — Progress Notes (Signed)
Brief note: Pt sent here from dialysis for SOB Didn't get any dialysis Likely combo of COPD exac and vol overload  Will dialyze here today.  Orders placed, RN aware.  Full c/s to follow.  Madelon Lips MD [employer] Pgr [phone]

## 2022-10-13 NOTE — Progress Notes (Addendum)
Could not complete hemo today due to SOB. Site looks dry/intact. 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 Some slight swelling to that leg. Lung sounds very diminished. Some labored breathing. No complaints of pain. Oriented to room and use of callbell.

## 2022-10-13 NOTE — H&P (Signed)
History and Physical    Kerry Underwood WUX:[id number] DOB: Jun 03, 1951 DOA: 10/13/2022  PCP: [name], FNP   Patient coming from: Dialysis facility  Chief Complaint: Dyspnea  HPI: Kerry Underwood is a 71 y.o. male with medical history significant for ESRD on HD TTS, COPD with ongoing tobacco abuse, hypertension, BPH, chronic anemia, chronic diastolic heart failure, and peripheral vascular disease who was attempting to go to his dialysis facility earlier this morning, but was noted to have worsening shortness of breath that prevented him from doing so.  In the last 3 days he states that he started having cold-like symptoms as well as worsening shortness of breath and he can barely walk into the dialysis facility.  He is oxygen levels while later were in the 80th percentile.  He did miss his previous hemodialysis session and due to his current symptoms was brought into the ED for further evaluation.   ED Course: Vital signs stable and patient is afebrile.  He was having some hypoxemia requiring nasal cannula supplementation and is currently receiving a breathing treatment.  Chest x-ray does show some pulmonary edema and he has received IV Lasix in the ED.  COVID and flu testing negative.  Nephrology consulted for hemodialysis.  Review of Systems: Reviewed as noted above, otherwise negative.  Past Medical History:  Diagnosis Date   Anemia    CHF (congestive heart failure) (HCC)    COPD (chronic obstructive pulmonary disease) ([place])    Dialysis patient [facility])    Full dentures    Hypertension    Pneumonia    Pyelonephritis 09/11/2017   Renal disorder    Stroke [facility])    " A few mini strokes, I didn't know it "    Past Surgical History:  Procedure Laterality Date   ABDOMINAL AORTOGRAM W/LOWER EXTREMITY Bilateral 09/20/2021   Procedure: ABDOMINAL AORTOGRAM W/LOWER EXTREMITY;  Surgeon: Broadus John, MD;  Location: [place] [facility];  Service: Cardiovascular;  Laterality: Bilateral;    AMPUTATION TOE Right 09/22/2021   Procedure: PARTIAL AMPUTATION SECOND RIGHT TOE;  Surgeon: Marty Heck, MD;  Location: [place];  Service: Vascular;  Laterality: Right;   [place] Right 04/19/2020   Procedure: Basilic Vein Transposition Right Arm;  Surgeon: Waynetta Sandy, MD;  Location: [place];  Service: Vascular;  Laterality: Right;   [place] Right 02/13/2022   Procedure: RIGHT ARM SECOND STAGE BASILIC VEIN TRANSPOSITION;  Surgeon: Rosetta Posner, MD;  Location: [facility];  Service: Vascular;  Laterality: Right;   COLON SURGERY     ENDARTERECTOMY FEMORAL Right 09/22/2021   Procedure: ENDARTERECTOMY FEMORAL WITH PATCH ANGIOPLASTY USING 1X6CM Weyman Pedro;  Surgeon: Marty Heck, MD;  Location: [place];  Service: Vascular;  Laterality: Right;   FEMORAL-TIBIAL BYPASS GRAFT Right 09/22/2021   Procedure: RIGHT FEMORAL TO BELOW KNEE POPLITEAL BYPASS WITH HARVEST OF GREATER SAPHENOUS VEIN;  Surgeon: Marty Heck, MD;  Location: [place];  Service: Vascular;  Laterality: Right;   INSERTION OF DIALYSIS CATHETER Right 04/19/2020   Procedure: Insertion Of Right Internal Jugular Dialysis Catheter;  Surgeon: Waynetta Sandy, MD;  Location: [facility];  Service: Vascular;  Laterality: Right;   MULTIPLE TOOTH EXTRACTIONS     PERIPHERAL VASCULAR BALLOON ANGIOPLASTY Right 09/20/2021   Procedure: PERIPHERAL VASCULAR BALLOON ANGIOPLASTY;  Surgeon: Broadus John, MD;  Location: [place] [facility];  Service: Cardiovascular;  Laterality: Right;  SFA  FAILED   REMOVAL OF A DIALYSIS CATHETER N/A 05/01/2022  Procedure: MINOR REMOVAL OF A TUNNELED DIALYSIS CATHETER;  Surgeon: Rosetta Posner, MD;  Location: [facility];  Service: Vascular;  Laterality: N/A;     reports that he has been smoking cigarettes. He has a 31.00 pack-year smoking history. He has quit using smokeless tobacco.  His smokeless tobacco use included chew. He reports current drug use.  Drug: Marijuana. He reports that he does not drink alcohol.  Allergies  Allergen Reactions   Strawberry (Diagnostic) Rash    Family History  Problem Relation Age of Onset   Alzheimer's disease Mother     Prior to Admission medications   Medication Sig Start Date End Date Taking? Authorizing Provider  albuterol (VENTOLIN HFA) 108 (90 Base) MCG/ACT inhaler Inhale 2 puffs into the lungs every 4 (four) hours as needed for wheezing or shortness of breath. 11/28/21   Hongalgi, Lenis Dickinson, MD  amLODipine (NORVASC) 5 MG tablet Take 5 mg by mouth at bedtime. 09/01/22   [provider]  aspirin EC 325 MG tablet Take by mouth. 02/27/22   [provider]  atorvastatin (LIPITOR) 40 MG tablet Take 1 tablet (40 mg total) by mouth daily. Patient not taking: Reported on 09/12/2022 09/26/21   Jonetta Osgood, MD  B Complex-C-Folic Acid (DIALYVITE 466) 0.8 MG TABS Take by mouth. 02/27/22   [provider]  Cholecalciferol 125 MCG (5000 UT) TABS Take by mouth. 02/27/22   [provider]  clopidogrel (PLAVIX) 75 MG tablet Take 1 tablet (75 mg total) by mouth daily with breakfast. 11/28/21   Hongalgi, Lenis Dickinson, MD  fluticasone furoate-vilanterol (BREO ELLIPTA) 200-25 MCG/INH AEPB Inhale 1 puff into the lungs daily. 11/28/21   Hongalgi, Lenis Dickinson, MD  metoprolol succinate (TOPROL XL) 25 MG 24 hr tablet Take 1 tablet (25 mg total) by mouth daily. 12/20/21   Strader, Fransisco Hertz, PA-C  oxyCODONE-acetaminophen (PERCOCET) 5-325 MG tablet Take 1 tablet by mouth every 6 (six) hours as needed for severe pain. Patient not taking: Reported on 09/12/2022 02/13/22   Early, Arvilla Meres, MD  sucroferric oxyhydroxide (VELPHORO) 500 MG chewable tablet Chew 1,000 mg by mouth 3 (three) times daily with meals.    [provider]  tamsulosin (FLOMAX) 0.4 MG CAPS capsule Take 0.4 mg by mouth at bedtime. 05/24/20   [provider]  tiotropium (SPIRIVA HANDIHALER) 18 MCG inhalation capsule Place 1  capsule (18 mcg total) into inhaler and inhale daily. 11/28/21   Modena Jansky, MD    Physical Exam: Vitals:   10/13/22 0830 10/13/22 0837 10/13/22 0845 10/13/22 0900  BP: 125/66  (!) 128/57 (!) 147/80  Pulse: 81 77 77 90  Resp: 19 18 19 20   Temp:      TempSrc:      SpO2: 99% 97% 95% 99%  Weight:      Height:        Constitutional: NAD, calm, comfortable Vitals:   10/13/22 0830 10/13/22 0837 10/13/22 0845 10/13/22 0900  BP: 125/66  (!) 128/57 (!) 147/80  Pulse: 81 77 77 90  Resp: 19 18 19 20   Temp:      TempSrc:      SpO2: 99% 97% 95% 99%  Weight:      Height:       Eyes: lids and conjunctivae normal Neck: normal, supple Respiratory: Diminished bilaterally and currently on oxygen supplementation with breathing treatment ongoing Cardiovascular: Regular rate and rhythm, no murmurs. Abdomen: no tenderness, no distention. Bowel sounds positive.  Musculoskeletal:  No edema. Skin:  no rashes, lesions, ulcers.  Psychiatric: Flat affect  Labs on Admission: I have personally reviewed following labs and imaging studies  CBC: Recent Labs  Lab 10/13/22 0805  WBC 11.5*  NEUTROABS 10.2*  HGB 11.1*  HCT 33.4*  MCV 99.1  PLT 378   Basic Metabolic Panel: Recent Labs  Lab 10/13/22 0805  NA 136  K 4.6  CL 96*  CO2 24  GLUCOSE 121*  BUN 51*  CREATININE 12.34*  CALCIUM 8.4*   GFR: Estimated Creatinine Clearance: 5.2 mL/min (A) (by C-G formula based on SCr of 12.34 mg/dL (H)). Liver Function Tests: No results for input(s): "AST", "ALT", "ALKPHOS", "BILITOT", "PROT", "ALBUMIN" in the last 168 hours. No results for input(s): "LIPASE", "AMYLASE" in the last 168 hours. No results for input(s): "AMMONIA" in the last 168 hours. Coagulation Profile: No results for input(s): "INR", "PROTIME" in the last 168 hours. Cardiac Enzymes: No results for input(s): "CKTOTAL", "CKMB", "CKMBINDEX", "TROPONINI" in the last 168 hours. BNP (last 3 results) No results for input(s):  "PROBNP" in the last 8760 hours. HbA1C: No results for input(s): "HGBA1C" in the last 72 hours. CBG: No results for input(s): "GLUCAP" in the last 168 hours. Lipid Profile: No results for input(s): "CHOL", "HDL", "LDLCALC", "TRIG", "CHOLHDL", "LDLDIRECT" in the last 72 hours. Thyroid Function Tests: No results for input(s): "TSH", "T4TOTAL", "FREET4", "T3FREE", "THYROIDAB" in the last 72 hours. Anemia Panel: No results for input(s): "VITAMINB12", "FOLATE", "FERRITIN", "TIBC", "IRON", "RETICCTPCT" in the last 72 hours. Urine analysis:    Component Value Date/Time   COLORURINE YELLOW 04/15/2020 2200   APPEARANCEUR TURBID (A) 04/15/2020 2200   LABSPEC 1.010 04/15/2020 2200   PHURINE 6.0 04/15/2020 2200   GLUCOSEU NEGATIVE 04/15/2020 2200   HGBUR SMALL (A) 04/15/2020 2200   BILIRUBINUR NEGATIVE 04/15/2020 2200   KETONESUR NEGATIVE 04/15/2020 2200   PROTEINUR 100 (A) 04/15/2020 2200   NITRITE POSITIVE (A) 04/15/2020 2200   LEUKOCYTESUR LARGE (A) 04/15/2020 2200    Radiological Exams on Admission: DG Chest Port 1 View  Result Date: 10/13/2022 CLINICAL DATA:  71 year old male with history of shortness of breath. EXAM: PORTABLE CHEST 1 VIEW COMPARISON:  Chest x-ray 11/26/2021. FINDINGS: There is cephalization of the pulmonary vasculature and slight indistinctness of the interstitial markings suggestive of mild pulmonary edema. No confluent consolidative airspace disease. Trace right pleural effusion. No definite left pleural effusion. No pneumothorax. Heart size is upper limits of normal. Upper mediastinal contours are within normal limits. Atherosclerotic calcifications are noted in the thoracic aorta. IMPRESSION: 1. The appearance of the chest suggest pulmonary edema. Given the normal cardiac size, this may reflect noncardiogenic pulmonary edema, fluid overload, or potential diastolic heart failure. Further clinical evaluation is recommended. Electronically Signed   By: Vinnie Langton M.D.    On: 10/13/2022 08:26    EKG: Independently reviewed. SR 90bpm.  Assessment/Plan Principal Problem:   Acute hypoxemic respiratory failure (HCC) Active Problems:   Hypertension secondary to other renal disorders   Benign prostatic hyperplasia with urinary retention   ESRD on hemodialysis ([place])   COPD with acute exacerbation (HCC)   Peripheral vascular disease (HCC)    Acute hypoxemic respiratory failure-multifactorial -With likely COPD exacerbation as well as acute diastolic CHF exacerbation -Started on IV Solu-Medrol 40 mg twice daily as well as Pulmicort and DuoNeb scheduled -Hemodialysis per nephrology to assist with pulmonary edema -Wean oxygen as tolerated  Acute on chronic diastolic CHF exacerbation -BNP elevation with pulmonary edema noted on chest x-ray -Prior 2D echocardiogram 12/22 with  grade 2 diastolic dysfunction and preserved LVEF -Repeat 2D echocardiogram during the stay -Routine hemodialysis to assist with volume management -Monitor daily weights  ESRD on HD TTS -Missed hemodialysis 11/2 and also did not receive hemodialysis today -Appreciate nephrology recommendations for dialysis while inpatient  Chronic anemia of CKD -Continue to monitor CBC with no overt bleeding currently noted  Hypertension -Continue home blood pressure medications with amlodipine and metoprolol  BPH -Continue tamsulosin  PVD -Continue Plavix, aspirin, and statin -Prior history of endovascular repair and toe amputation  History of COPD with ongoing tobacco abuse -With exacerbation as noted above, continue breathing treatments and steroids -Counseled on cessation   DVT prophylaxis: Heparin Code Status: Full Family Communication: None at bedside Disposition Plan:Admit for COPD exacerbation/pulm edema Consults called:Nephrology for HD Admission status: Inpatient, Tele  Severity of Illness: The appropriate patient status for this patient is INPATIENT. Inpatient status is  judged to be reasonable and necessary in order to provide the required intensity of service to ensure the patient's safety. The patient's presenting symptoms, physical exam findings, and initial radiographic and laboratory data in the context of their chronic comorbidities is felt to place them at high risk for further clinical deterioration. Furthermore, it is not anticipated that the patient will be medically stable for discharge from the hospital within 2 midnights of admission.   * I certify that at the point of admission it is my clinical judgment that the patient will require inpatient hospital care spanning beyond 2 midnights from the point of admission due to high intensity of service, high risk for further deterioration and high frequency of surveillance required.*   Kerry Underwood D Eli Adami DO [facility]  If 7PM-7AM, please contact night-coverage [email or web address]  10/13/2022, 10:19 AM

## 2022-10-13 NOTE — ED Notes (Signed)
Pt given urinal, call light w/in reach if assistance needed.

## 2022-10-13 NOTE — ED Provider Notes (Signed)
[facility] EMERGENCY DEPARTMENT Provider Note   CSN: [id number] Arrival date & time: 10/13/22  0272     History  Chief Complaint  Patient presents with   Shortness of Breath    Kerry Underwood is a 71 y.o. male.  Pt is a 71 yo male with a pmhx significant for ESRD on HD, COPD, tobacco abuse (He smokes 10 cigarettes/day which he hand rolls with "natural tobacco."), HTN, CHF, TIA, and chronic anemia.  Pt said he's had cold sx for a few days.  He has been living in his car since September, but has just gotten a place in the last 3 days.  He has been more sob for the last week.  He presented to dialysis today with worsening sob.  Pt said he could barely walk inside the center from his car due to the sob.  The dialysis center called EMS and RA O2 sat was in the high 80s, so he was put on 3L oxygen.  O2 sat now in the mid-90s.  Pt feels a little better, but is still sob.  Pt denies fever.  No dialysis today due to the SOB.  He was weighed at the center and weight was the same.  Pt still urinates.       Home Medications Prior to Admission medications   Medication Sig Start Date End Date Taking? Authorizing Provider  albuterol (VENTOLIN HFA) 108 (90 Base) MCG/ACT inhaler Inhale 2 puffs into the lungs every 4 (four) hours as needed for wheezing or shortness of breath. 11/28/21   Hongalgi, Lenis Dickinson, MD  amLODipine (NORVASC) 5 MG tablet Take 5 mg by mouth at bedtime. 09/01/22   [provider]  aspirin EC 325 MG tablet Take by mouth. 02/27/22   [provider]  atorvastatin (LIPITOR) 40 MG tablet Take 1 tablet (40 mg total) by mouth daily. Patient not taking: Reported on 09/12/2022 09/26/21   Jonetta Osgood, MD  B Complex-C-Folic Acid (DIALYVITE 536) 0.8 MG TABS Take by mouth. 02/27/22   [provider]  Cholecalciferol 125 MCG (5000 UT) TABS Take by mouth. 02/27/22   [provider]  clopidogrel (PLAVIX) 75 MG tablet Take 1 tablet (75 mg total) by mouth daily  with breakfast. 11/28/21   Hongalgi, Lenis Dickinson, MD  fluticasone furoate-vilanterol (BREO ELLIPTA) 200-25 MCG/INH AEPB Inhale 1 puff into the lungs daily. 11/28/21   Hongalgi, Lenis Dickinson, MD  metoprolol succinate (TOPROL XL) 25 MG 24 hr tablet Take 1 tablet (25 mg total) by mouth daily. 12/20/21   Strader, Fransisco Hertz, PA-C  oxyCODONE-acetaminophen (PERCOCET) 5-325 MG tablet Take 1 tablet by mouth every 6 (six) hours as needed for severe pain. Patient not taking: Reported on 09/12/2022 02/13/22   Early, Arvilla Meres, MD  sucroferric oxyhydroxide (VELPHORO) 500 MG chewable tablet Chew 1,000 mg by mouth 3 (three) times daily with meals.    [provider]  tamsulosin (FLOMAX) 0.4 MG CAPS capsule Take 0.4 mg by mouth at bedtime. 05/24/20   [provider]  tiotropium (SPIRIVA HANDIHALER) 18 MCG inhalation capsule Place 1 capsule (18 mcg total) into inhaler and inhale daily. 11/28/21   Hongalgi, Lenis Dickinson, MD      Allergies    Strawberry (diagnostic)    Review of Systems   Review of Systems  Respiratory:  Positive for shortness of breath.   All other systems reviewed and are negative.   Physical Exam Updated Vital Signs BP (!) 147/80   Pulse 90   Temp  97.9 F (36.6 C) (Oral)   Resp 20   Ht 5\' 5"  (1.651 m)   Wt 81.6 kg   SpO2 99%   BMI 29.95 kg/m  Physical Exam Vitals and nursing note reviewed.  Constitutional:      Appearance: He is well-developed. He is ill-appearing.  HENT:     Head: Normocephalic and atraumatic.     Mouth/Throat:     Mouth: Mucous membranes are moist.     Pharynx: Oropharynx is clear.  Eyes:     Extraocular Movements: Extraocular movements intact.     Pupils: Pupils are equal, round, and reactive to light.  Cardiovascular:     Rate and Rhythm: Normal rate and regular rhythm.  Pulmonary:     Effort: Tachypnea and respiratory distress present.     Breath sounds: Wheezing and rhonchi present.  Abdominal:     General: Bowel sounds are normal.      Palpations: Abdomen is soft.  Musculoskeletal:        General: Normal range of motion.     Cervical back: Normal range of motion and neck supple.     Comments: AVF R arm with + thrill  Skin:    General: Skin is warm.     Capillary Refill: Capillary refill takes less than 2 seconds.  Neurological:     General: No focal deficit present.     Mental Status: He is alert and oriented to person, place, and time.  Psychiatric:        Mood and Affect: Mood normal.        Behavior: Behavior normal.     ED Results / Procedures / Treatments   Labs (all labs ordered are listed, but only abnormal results are displayed) Labs Reviewed  BASIC METABOLIC PANEL - Abnormal; Notable for the following components:      Result Value   Chloride 96 (*)    Glucose, Bld 121 (*)    BUN 51 (*)    Creatinine, Ser 12.34 (*)    Calcium 8.4 (*)    GFR, Estimated 4 (*)    Anion gap 16 (*)    All other components within normal limits  BRAIN NATRIURETIC PEPTIDE - Abnormal; Notable for the following components:   B Natriuretic Peptide 2,290.0 (*)    All other components within normal limits  CBC WITH DIFFERENTIAL/PLATELET - Abnormal; Notable for the following components:   WBC 11.5 (*)    RBC 3.37 (*)    Hemoglobin 11.1 (*)    HCT 33.4 (*)    Neutro Abs 10.2 (*)    Lymphs Abs 0.6 (*)    All other components within normal limits  RESP PANEL BY RT-PCR (FLU A&B, COVID) [id number]    EKG EKG Interpretation  Date/Time:  Saturday October 13 2022 08:03:44 EDT Ventricular Rate:  90 PR Interval:  159 QRS Duration: 91 QT Interval:  400 QTC Calculation: 490 R Axis:   43 Text Interpretation: Sinus rhythm Ventricular premature complex Aberrant conduction of SV complex(es) Probable left atrial enlargement Borderline ST depression, anterolateral leads Borderline prolonged QT interval No significant change since last tracing Confirmed by Isla Pence [phone]) on 10/13/2022 8:16:20 AM  Radiology DG Chest Port 1  View  Result Date: 10/13/2022 CLINICAL DATA:  71 year old male with history of shortness of breath. EXAM: PORTABLE CHEST 1 VIEW COMPARISON:  Chest x-ray 11/26/2021. FINDINGS: There is cephalization of the pulmonary vasculature and slight indistinctness of the interstitial markings suggestive of mild pulmonary edema. No confluent consolidative  airspace disease. Trace right pleural effusion. No definite left pleural effusion. No pneumothorax. Heart size is upper limits of normal. Upper mediastinal contours are within normal limits. Atherosclerotic calcifications are noted in the thoracic aorta. IMPRESSION: 1. The appearance of the chest suggest pulmonary edema. Given the normal cardiac size, this may reflect noncardiogenic pulmonary edema, fluid overload, or potential diastolic heart failure. Further clinical evaluation is recommended. Electronically Signed   By: Vinnie Langton M.D.   On: 10/13/2022 08:26    Procedures Procedures    Medications Ordered in ED Medications  ipratropium-albuterol (DUONEB) 0.5-2.5 (3) MG/3ML nebulizer solution 3 mL (3 mLs Nebulization Given 10/13/22 6301)  methylPREDNISolone sodium succinate (SOLU-MEDROL) 125 mg/2 mL injection 125 mg (125 mg Intravenous Given 10/13/22 0822)  albuterol (PROVENTIL) (2.5 MG/3ML) 0.083% nebulizer solution 5 mg (5 mg Nebulization Given 10/13/22 0927)  furosemide (LASIX) injection 40 mg (40 mg Intravenous Given 10/13/22 6010)    ED Course/ Medical Decision Making/ A&P                           Medical Decision Making Amount and/or Complexity of Data Reviewed Labs: ordered. Radiology: ordered.  Risk Prescription drug management. Decision regarding hospitalization.   This patient presents to the ED for concern of sob, this involves an extensive number of treatment options, and is a complaint that carries with it a high risk of complications and morbidity.  The differential diagnosis includes COPD exac, PNA, bronchitis, covid/flu   Co  morbidities that complicate the patient evaluation   ESRD on HD, COPD, tobacco abuse (He smokes 10 cigarettes/day which he hand rolls with "natural tobacco."), HTN, CHF, TIA, and chronic anemia   Additional history obtained:  Additional history obtained from epic chart review External records from outside source obtained and reviewed including EMS report   Lab Tests:  I Ordered, and personally interpreted labs.  The pertinent results include:  cbc with wbc 11.5, hgb 11.1 (chronic); bmp with bun 51 and cr 12.34 (chronic); bnp 2290   Imaging Studies ordered:  I ordered imaging studies including CXR  I independently visualized and interpreted imaging which showed IMPRESSION: 1. The appearance of the chest suggest pulmonary edema. Given the normal cardiac size, this may reflect noncardiogenic pulmonary edema, fluid overload, or potential diastolic heart failure. I agree with the radiologist interpretation   Cardiac Monitoring:  The patient was maintained on a cardiac monitor.  I personally viewed and interpreted the cardiac monitored which showed an underlying rhythm of: nsr   Medicines ordered and prescription drug management:  I ordered medication including duoneb/solumedrol  for copd  Reevaluation of the patient after these medicines showed that the patient improved I have reviewed the patients home medicines and have made adjustments as needed   Test Considered:  ct   Critical Interventions:  oxygen   Consultations Obtained:  I requested consultation with the hospitalist (Dr. Manuella Ghazi),  and discussed lab and imaging findings as well as pertinent plan - he will admit.   Problem List / ED Course:  Resp failure:  likely due to COPD.  Pt has been smoking for 63 years.  Pt given steroids and nebs.  He is on 3L oxygen and sats are in the mid-90s.  Pt does have some fluid overload and was due for dialysis today.  He still urinates, so I will give him a dose of lasix.  I will  talk with nephrology. ESKD on HD:  pt d/w Dr. Hollie Salk (  nephrology).  She will see pt in consult.   Reevaluation:  After the interventions noted above, I reevaluated the patient and found that they have :improved   Social Determinants of Health:  Was living in his car, but now has a place   Dispostion:  After consideration of the diagnostic results and the patients response to treatment, I feel that the patent would benefit from admission.    CRITICAL CARE Performed by: Isla Pence   Total critical care time: 30 minutes  Critical care time was exclusive of separately billable procedures and treating other patients.  Critical care was necessary to treat or prevent imminent or life-threatening deterioration.  Critical care was time spent personally by me on the following activities: development of treatment plan with patient and/or surrogate as well as nursing, discussions with consultants, evaluation of patient's response to treatment, examination of patient, obtaining history from patient or surrogate, ordering and performing treatments and interventions, ordering and review of laboratory studies, ordering and review of radiographic studies, pulse oximetry and re-evaluation of patient's condition.         Final Clinical Impression(s) / ED Diagnoses Final diagnoses:  COPD exacerbation ([place])  Acute respiratory failure with hypoxia ([place])  ESRD on hemodialysis ([place])  Tobacco abuse    Rx / DC Orders ED Discharge Orders     None         Isla Pence, MD 10/13/22 [phone]

## 2022-10-13 NOTE — Procedures (Signed)
    HEMODIALYSIS TREATMENT NOTE:   3.5 hour low-heparin treatment completed using right upper arm AVF (15g/antegrade). Goal met: 2.5 liters removed.  UF was interrupted once and goal was lowered once in response to leg cramps.  No meds ordered / given.  All blood was returned. Hemostasis was achieved in 15 minutes.  Hand-off given to Darnelle Catalan, RN.  Post HD v/s:  temp 97.8,  136/70,  p84,  r20,  spO2 100% on 2L O2 [place],  weight 79.2kg   Rockwell Alexandria, RN

## 2022-10-13 NOTE — ED Triage Notes (Addendum)
Pt brought in by [facility] from the dialysis center with c/o worsening SOB since this morning. He never started his dialysis treatment this morning due to his SOB. Pt reports some SOB over the last week. O2 sat was in the high 80s on RA at dialysis center and was placed on O2. Lung sounds clear, diminished in right lower lobe per EMS. Smoker. BP 141/77, HR 88, O2 sat 95% on 3L O2 via [place] for EMS. Pt does not normally wear O2.

## 2022-10-14 DIAGNOSIS — I132 Hypertensive heart and chronic kidney disease with heart failure and with stage 5 chronic kidney disease, or end stage renal disease: Secondary | ICD-10-CM | POA: Diagnosis not present

## 2022-10-14 DIAGNOSIS — J9601 Acute respiratory failure with hypoxia: Secondary | ICD-10-CM | POA: Diagnosis not present

## 2022-10-14 DIAGNOSIS — I214 Non-ST elevation (NSTEMI) myocardial infarction: Secondary | ICD-10-CM | POA: Diagnosis not present

## 2022-10-14 LAB — CBC
HCT: 28.1 % — ABNORMAL LOW (ref 39.0–52.0)
Hemoglobin: 9.5 g/dL — ABNORMAL LOW (ref 13.0–17.0)
MCH: 33.6 pg (ref 26.0–34.0)
MCHC: 33.8 g/dL (ref 30.0–36.0)
MCV: 99.3 fL (ref 80.0–100.0)
Platelets: 236 10*3/uL (ref 150–400)
RBC: 2.83 MIL/uL — ABNORMAL LOW (ref 4.22–5.81)
RDW: 14.1 % (ref 11.5–15.5)
WBC: 7.4 10*3/uL (ref 4.0–10.5)
nRBC: 0 % (ref 0.0–0.2)

## 2022-10-14 LAB — BASIC METABOLIC PANEL
Anion gap: 13 (ref 5–15)
BUN: 29 mg/dL — ABNORMAL HIGH (ref 8–23)
CO2: 27 mmol/L (ref 22–32)
Calcium: 8.6 mg/dL — ABNORMAL LOW (ref 8.9–10.3)
Chloride: 96 mmol/L — ABNORMAL LOW (ref 98–111)
Creatinine, Ser: 6.55 mg/dL — ABNORMAL HIGH (ref 0.61–1.24)
GFR, Estimated: 8 mL/min — ABNORMAL LOW (ref >60)
Glucose, Bld: 110 mg/dL — ABNORMAL HIGH (ref 70–99)
Potassium: 4.1 mmol/L (ref 3.5–5.1)
Sodium: 136 mmol/L (ref 135–145)

## 2022-10-14 LAB — MAGNESIUM: Magnesium: 1.8 mg/dL (ref 1.7–2.4)

## 2022-10-14 LAB — HEPATITIS B SURFACE ANTIBODY, QUANTITATIVE: Hep B S AB Quant (Post): 6.4 m[IU]/mL — ABNORMAL LOW (ref >9.9)

## 2022-10-14 MED ORDER — ALBUTEROL SULFATE HFA 108 (90 BASE) MCG/ACT IN AERS: 2.0000 | INHALATION_SPRAY | RESPIRATORY_TRACT | 2 refills | Status: AC | PRN

## 2022-10-14 MED ORDER — PREDNISONE 10 MG PO TABS: 40.0000 mg | ORAL_TABLET | Freq: Every day | ORAL | 0 refills | Status: DC

## 2022-10-14 MED ORDER — FLUTICASONE FUROATE-VILANTEROL 200-25 MCG/INH IN AEPB: 1.0000 | INHALATION_SPRAY | Freq: Every day | RESPIRATORY_TRACT | 1 refills | Status: AC

## 2022-10-14 MED ORDER — TIOTROPIUM BROMIDE MONOHYDRATE 18 MCG IN CAPS: 18.0000 ug | ORAL_CAPSULE | Freq: Every day | RESPIRATORY_TRACT | 1 refills | Status: AC

## 2022-10-14 MED ORDER — IPRATROPIUM-ALBUTEROL 0.5-2.5 (3) MG/3ML IN SOLN
3.0000 mL | Freq: Three times a day (TID) | RESPIRATORY_TRACT | Status: DC
  Administered 2022-10-14: 3 mL via RESPIRATORY_TRACT

## 2022-10-14 NOTE — Progress Notes (Unsigned)
Cardiology Office Note:    Date:  10/14/2022   ID:  Kerry Underwood, DOB 09/15/1951, MRN [id number]  PCP:  [name], [place] [facility] Cardiologist:  Freada Bergeron, MD { Click to update primary MD,subspecialty MD or APP then REFRESH:1}    Referring MD: [name], FNP   No chief complaint on file. ***  History of Present Illness:    Kerry Underwood is a 71 y.o. male with a hx of ***  Past Medical History:  Diagnosis Date   Anemia    CHF (congestive heart failure) (HCC)    COPD (chronic obstructive pulmonary disease) ([place])    Dialysis patient ([place])    Full dentures    Hypertension    Pneumonia    Pyelonephritis 09/11/2017   Renal disorder    Stroke [facility])    " A few mini strokes, I didn't know it "    Past Surgical History:  Procedure Laterality Date   ABDOMINAL AORTOGRAM W/LOWER EXTREMITY Bilateral 09/20/2021   Procedure: ABDOMINAL AORTOGRAM W/LOWER EXTREMITY;  Surgeon: Broadus John, MD;  Location: [place] [facility];  Service: Cardiovascular;  Laterality: Bilateral;   AMPUTATION TOE Right 09/22/2021   Procedure: PARTIAL AMPUTATION SECOND RIGHT TOE;  Surgeon: Marty Heck, MD;  Location: [place];  Service: Vascular;  Laterality: Right;   [place] Right 04/19/2020   Procedure: Basilic Vein Transposition Right Arm;  Surgeon: Waynetta Sandy, MD;  Location: [place];  Service: Vascular;  Laterality: Right;   [place] Right 02/13/2022   Procedure: RIGHT ARM SECOND STAGE BASILIC VEIN TRANSPOSITION;  Surgeon: Rosetta Posner, MD;  Location: [facility];  Service: Vascular;  Laterality: Right;   COLON SURGERY     ENDARTERECTOMY FEMORAL Right 09/22/2021   Procedure: ENDARTERECTOMY FEMORAL WITH PATCH ANGIOPLASTY USING 1X6CM Weyman Pedro;  Surgeon: Marty Heck, MD;  Location: [place];  Service: Vascular;  Laterality: Right;   FEMORAL-TIBIAL BYPASS GRAFT Right 09/22/2021   Procedure: RIGHT  FEMORAL TO BELOW KNEE POPLITEAL BYPASS WITH HARVEST OF GREATER SAPHENOUS VEIN;  Surgeon: Marty Heck, MD;  Location: [place];  Service: Vascular;  Laterality: Right;   INSERTION OF DIALYSIS CATHETER Right 04/19/2020   Procedure: Insertion Of Right Internal Jugular Dialysis Catheter;  Surgeon: Waynetta Sandy, MD;  [facility];  Service: Vascular;  Laterality: Right;   MULTIPLE TOOTH EXTRACTIONS     PERIPHERAL VASCULAR BALLOON ANGIOPLASTY Right 09/20/2021   Procedure: PERIPHERAL VASCULAR BALLOON ANGIOPLASTY;  Surgeon: Broadus John, MD;  Location: [place] [facility];  Service: Cardiovascular;  Laterality: Right;  SFA  FAILED   REMOVAL OF A DIALYSIS CATHETER N/A 05/01/2022   Procedure: MINOR REMOVAL OF A TUNNELED DIALYSIS CATHETER;  Surgeon: Rosetta Posner, MD;  Location: [facility];  Service: Vascular;  Laterality: N/A;    Current Medications: No outpatient medications have been marked as taking for the 10/16/22 encounter (Appointment) with Freada Bergeron, MD.     Allergies:   Strawberry (diagnostic)   Social History   Socioeconomic History   Marital status: Single    Spouse name: Not on file   Number of children: Not on file   Years of education: Not on file   Highest education level: Not on file  Occupational History   Occupation: retired  Tobacco Use   Smoking status: Every Day    Packs/day: 0.50    Years: 62.00    Total pack years: 31.00  Types: Cigarettes   Smokeless tobacco: Former    Types: Chew   Tobacco comments:    Started smoking at age 41 - 67 his own tobacco cigarettes   Vaping Use   Vaping Use: Never used  Substance and Sexual Activity   Alcohol use: No   Drug use: Yes    Types: Marijuana    Comment: once every 3-4 weeks   Sexual activity: Not Currently    Birth control/protection: None  Other Topics Concern   Not on file  Social History Narrative   Lives alone. No family nearby. He does get out and socialize with friends.     Doesn't have  a good relationship with his children   Social Determinants of Health   Financial Resource Strain: Medium Risk (11/22/2021)   Overall Financial Resource Strain (CARDIA)    Difficulty of Paying Living Expenses: Somewhat hard  Food Insecurity: No Food Insecurity (10/13/2022)   Hunger Vital Sign    Worried About Running Out of Food in the Last Year: Never true    Ran Out of Food in the Last Year: Never true  Transportation Needs: No Transportation Needs (10/13/2022)   PRAPARE - Hydrologist (Medical): No    Lack of Transportation (Non-Medical): No  Physical Activity: Sufficiently Active (11/22/2021)   Exercise Vital Sign    Days of Exercise per Week: 5 days    Minutes of Exercise per Session: 30 min  Stress: No Stress Concern Present (11/22/2021)   [place]    Feeling of Stress : Not at all  Social Connections: Socially Isolated (11/22/2021)   Social Connection and Isolation Panel [NHANES]    Frequency of Communication with Friends and Family: More than three times a week    Frequency of Social Gatherings with Friends and Family: More than three times a week    Attends Religious Services: Never    Marine scientist or Organizations: No    Attends Archivist Meetings: Never    Marital Status: Never married     Family History: The patient's ***family history includes Alzheimer's disease in his mother.  ROS:   Please see the history of present illness.    *** All other systems reviewed and are negative.  EKGs/Labs/Other Studies Reviewed:    The following studies were reviewed today: ***  EKG:  EKG is *** ordered today.  The ekg ordered today demonstrates ***  Recent Labs: 10/13/2022: B Natriuretic Peptide 2,290.0 10/14/2022: BUN 29; Creatinine, Ser 6.55; Hemoglobin 9.5; Magnesium 1.8; Platelets 236; Potassium 4.1; Sodium 136  Recent Lipid Panel     Component Value Date/Time   CHOL 91 09/23/2021 0427   CHOL 146 08/23/2021 1208   TRIG 91 09/23/2021 0427   HDL 19 (L) 09/23/2021 0427   HDL 30 (L) 08/23/2021 1208   CHOLHDL 4.8 09/23/2021 0427   VLDL 18 09/23/2021 0427   LDLCALC 54 09/23/2021 0427   LDLCALC 90 08/23/2021 1208     Risk Assessment/Calculations:   {Does this patient have ATRIAL FIBRILLATION?:[phone]}            Physical Exam:    VS:  There were no vitals taken for this visit.    Wt Readings from Last 3 Encounters:  10/14/22 189 lb 2.5 oz (85.8 kg)  09/12/22 186 lb 3.2 oz (84.5 kg)  02/09/22 170 lb (77.1 kg)     GEN: *** Well nourished, well developed in no  acute distress HEENT: Normal NECK: No JVD; No carotid bruits LYMPHATICS: No lymphadenopathy CARDIAC: ***RRR, no murmurs, rubs, gallops RESPIRATORY:  Clear to auscultation without rales, wheezing or rhonchi  ABDOMEN: Soft, non-tender, non-distended MUSCULOSKELETAL:  No edema; No deformity  SKIN: Warm and dry NEUROLOGIC:  Alert and oriented x 3 PSYCHIATRIC:  Normal affect   ASSESSMENT:    No diagnosis found. PLAN:    In order of problems listed above:  ***      {Are you ordering a CV Procedure (e.g. stress test, cath, DCCV, TEE, etc)?   Press F2        :[id number]}    Medication Adjustments/Labs and Tests Ordered: Current medicines are reviewed at length with the patient today.  Concerns regarding medicines are outlined above.  No orders of the defined types were placed in this encounter.  No orders of the defined types were placed in this encounter.   There are no Patient Instructions on file for this visit.   Signed, Freada Bergeron, MD  10/14/2022 8:09 PM    [place]

## 2022-10-14 NOTE — Care Management CC44 (Signed)
Condition Code 44 Documentation Completed  Patient Details  Name: Kerry Underwood MRN: [id number] Date of Birth: [birth year]   Condition Code 44 given:  Yes Patient signature on Condition Code 44 notice:  Yes Documentation of 2 MD's agreement:  Yes Code 44 added to claim:  Yes    Iona Beard, [place] 10/14/2022, 10:04 AM

## 2022-10-14 NOTE — Progress Notes (Signed)
  Transition of Care [facility]) Screening Note   Patient Details  Name: Governor Matos Date of Birth: [birth year]   Transition of Care [facility]) CM/SW Contact:    Iona Beard, [place] Phone Number: 10/14/2022, 10:12 AM    Transition of Care Department [facility]) has reviewed patient and no TOC needs have been identified at this time. We will continue to monitor patient advancement through interdisciplinary progression rounds. If new patient transition needs arise, please place a TOC consult.

## 2022-10-14 NOTE — Progress Notes (Signed)
Discharge instructions given patient verbalized understanding. Discharged patient by Kerry Jersey LPN via wheelchair to friends private vehicle.

## 2022-10-14 NOTE — Discharge Summary (Signed)
Physician Discharge Summary  Kerry Underwood NID:[id number] DOB: 1951-07-10 DOA: 10/13/2022  PCP: [name], FNP  Admit date: 10/13/2022  Discharge date: 10/14/2022  Admitted From:Home  Disposition:  Home  Recommendations for Outpatient Follow-up:  Follow up with PCP in 1-2 weeks Refills given on inhalers and breathing treatments Prednisone taper prescribed to complete 5-day course Continue hemodialysis as routinely scheduled 11/7  Home Health: None  Equipment/Devices: None  Discharge Condition:Stable  CODE STATUS: Full  Diet recommendation: Heart Healthy  Brief/Interim Summary:  Kerry Underwood is a 71 y.o. male with medical history significant for ESRD on HD TTS, COPD with ongoing tobacco abuse, hypertension, BPH, chronic anemia, chronic diastolic heart failure, and peripheral vascular disease who was attempting to go to his dialysis facility earlier this morning, but was noted to have worsening shortness of breath that prevented him from doing so.  He was admitted for multifactorial acute hypoxemic respiratory failure with mild pulmonary edema and suspected acute on chronic diastolic CHF exacerbation.  He was more likely to have a COPD exacerbation and responded well to breathing treatments and steroids and is now in stable condition for discharge with no further shortness of breath or hypoxemia noted.  He did receive hemodialysis 11/4 with 2500 mL fluid removed and he tolerated that well.  2D echocardiogram as noted below with EF 70-75% and no other acute findings noted.  Discharge Diagnoses:  Principal Problem:   Acute hypoxemic respiratory failure (HCC) Active Problems:   Hypertension secondary to other renal disorders   Benign prostatic hyperplasia with urinary retention   ESRD on hemodialysis (HCC)   COPD with acute exacerbation (HCC)   Peripheral vascular disease (HCC)  Principal discharge diagnosis: Acute hypoxemic respiratory failure-multifactorial with acute on  chronic diastolic CHF exacerbation in the setting of missed hemodialysis sessions as well as acute COPD exacerbation.  Discharge Instructions  Discharge Instructions     Diet - low sodium heart healthy   Complete by: As directed    Increase activity slowly   Complete by: As directed       Allergies as of 10/14/2022       Reactions   Strawberry (diagnostic) Rash        Medication List     TAKE these medications    albuterol 108 (90 Base) MCG/ACT inhaler Commonly known as: VENTOLIN HFA Inhale 2 puffs into the lungs every 4 (four) hours as needed for wheezing or shortness of breath.   amLODipine 5 MG tablet Commonly known as: NORVASC Take 5 mg by mouth at bedtime.   aspirin EC 325 MG tablet Take by mouth.   atorvastatin 40 MG tablet Commonly known as: LIPITOR Take 1 tablet (40 mg total) by mouth daily.   Cholecalciferol 125 MCG (5000 UT) Tabs Take by mouth.   clopidogrel 75 MG tablet Commonly known as: PLAVIX Take 1 tablet (75 mg total) by mouth daily with breakfast.   Dialyvite 800 0.8 MG Tabs Take by mouth.   fluticasone furoate-vilanterol 200-25 MCG/INH Aepb Commonly known as: BREO ELLIPTA Inhale 1 puff into the lungs daily.   metoprolol succinate 25 MG 24 hr tablet Commonly known as: Toprol XL Take 1 tablet (25 mg total) by mouth daily.   oxyCODONE-acetaminophen 5-325 MG tablet Commonly known as: Percocet Take 1 tablet by mouth every 6 (six) hours as needed for severe pain.   predniSONE 10 MG tablet Commonly known as: DELTASONE Take 4 tablets (40 mg total) by mouth daily for 5 days.   sucroferric oxyhydroxide 500  MG chewable tablet Commonly known as: VELPHORO Chew 1,000 mg by mouth 3 (three) times daily with meals.   tamsulosin 0.4 MG Caps capsule Commonly known as: FLOMAX Take 0.4 mg by mouth at bedtime.   tiotropium 18 MCG inhalation capsule Commonly known as: Spiriva HandiHaler Place 1 capsule (18 mcg total) into inhaler and inhale  daily.        Follow-up Information     [name], FNP. Schedule an appointment as soon as possible for a visit in 1 week(s).   Specialty: Family Medicine Contact information: [place] [id number] [phone]                Allergies  Allergen Reactions   Strawberry (Diagnostic) Rash    Consultations: Nephrology   Procedures/Studies: ECHOCARDIOGRAM COMPLETE  Result Date: 10/13/2022    ECHOCARDIOGRAM REPORT   Patient Name:   Kerry Underwood Date of Exam: 10/13/2022 Medical Rec #:  [id number]    Height:       65.0 in Accession #:    [id number]   Weight:       184.3 lb Date of Birth:  1951/06/05    BSA:          1.911 m Patient Age:    71 years     BP:           161/81 mmHg Patient Gender: M            HR:           83 bpm. Exam Location:  [facility] Procedure: 2D Echo, Cardiac Doppler and Color Doppler Indications:    CHF  History:        Patient has prior history of Echocardiogram examinations, most                 recent 11/25/2021. CHF, COPD; Risk Factors:Hypertension.  Sonographer:    Wenda Low Referring Phys: [id number] [place]  1. Left ventricular ejection fraction, by estimation, is 70 to 75%. The left ventricle has hyperdynamic function. The left ventricle has no regional wall motion abnormalities. There is mild left ventricular hypertrophy. Left ventricular diastolic parameters are indeterminate.  2. Right ventricular systolic function is normal. The right ventricular size is normal. There is moderately elevated pulmonary artery systolic pressure. The estimated right ventricular systolic pressure is 75.1 mmHg.  3. Left atrial size was moderately dilated.  4. Right atrial size was mildly dilated.  5. The mitral valve is degenerative. Mild mitral valve regurgitation. Moderate mitral stenosis. The mean mitral valve gradient is 9.5 mmHg with average heart rate of 82 bpm. Severe mitral annular calcification. DVI 0.52, in setting of  hyperdynamic ventricle.  6. The aortic valve is abnormal. There is moderate calcification of the aortic valve. There is moderate thickening of the aortic valve. Aortic valve regurgitation is not visualized. Moderate to severe aortic valve stenosis. Aortic valve mean gradient measures 35.5 mmHg. Aortic valve Vmax measures 3.84 m/s.  7. The inferior vena cava is normal in size with <50% respiratory variability, suggesting right atrial pressure of 8 mmHg. FINDINGS  Left Ventricle: Left ventricular ejection fraction, by estimation, is 70 to 75%. The left ventricle has hyperdynamic function. The left ventricle has no regional wall motion abnormalities. The left ventricular internal cavity size was normal in size. There is mild left ventricular hypertrophy. Left ventricular diastolic function could not be evaluated due to mitral annular calcification (moderate or greater). Left ventricular diastolic parameters are indeterminate. Right  Ventricle: The right ventricular size is normal. No increase in right ventricular wall thickness. Right ventricular systolic function is normal. There is moderately elevated pulmonary artery systolic pressure. The tricuspid regurgitant velocity is 3.51 m/s, and with an assumed right atrial pressure of 8 mmHg, the estimated right ventricular systolic pressure is 76.7 mmHg. Left Atrium: Left atrial size was moderately dilated. Right Atrium: Right atrial size was mildly dilated. Pericardium: There is no evidence of pericardial effusion. Mitral Valve: The mitral valve is degenerative in appearance. Severe mitral annular calcification. Mild mitral valve regurgitation. Moderate mitral valve stenosis. MV peak gradient, 22.5 mmHg. The mean mitral valve gradient is 9.5 mmHg with average heart  rate of 82 bpm. Tricuspid Valve: The tricuspid valve is normal in structure. Tricuspid valve regurgitation is mild . No evidence of tricuspid stenosis. Aortic Valve: The aortic valve is abnormal. There is  moderate calcification of the aortic valve. There is moderate thickening of the aortic valve. Aortic valve regurgitation is not visualized. Moderate to severe aortic stenosis is present. Aortic valve mean gradient measures 35.5 mmHg. Aortic valve peak gradient measures 59.1 mmHg. Aortic valve area, by VTI measures 0.99 cm. Pulmonic Valve: The pulmonic valve was normal in structure. Pulmonic valve regurgitation is trivial. No evidence of pulmonic stenosis. Aorta: The ascending aorta was not well visualized and the aortic root is normal in size and structure. Venous: The inferior vena cava is normal in size with less than 50% respiratory variability, suggesting right atrial pressure of 8 mmHg. IAS/Shunts: No atrial level shunt detected by color flow Doppler.  LEFT VENTRICLE PLAX 2D LVIDd:         4.70 cm   Diastology LVIDs:         3.20 cm   LV e' medial:  8.05 cm/s LV PW:         1.30 cm   LV e' lateral: 10.40 cm/s LV IVS:        1.00 cm LVOT diam:     2.00 cm LV SV:         95 LV SV Index:   50 LVOT Area:     3.14 cm  RIGHT VENTRICLE RV Basal diam:  3.20 cm RV Mid diam:    2.50 cm RV S prime:     12.50 cm/s TAPSE (M-mode): 2.6 cm LEFT ATRIUM             Index        RIGHT ATRIUM           Index LA diam:        4.40 cm 2.30 cm/m   RA Area:     23.10 cm LA Vol (A2C):   98.0 ml 51.29 ml/m  RA Volume:   68.60 ml  35.90 ml/m LA Vol (A4C):   77.2 ml 40.41 ml/m LA Biplane Vol: 96.3 ml 50.40 ml/m  AORTIC VALVE                     PULMONIC VALVE AV Area (Vmax):    1.05 cm      PV Vmax:       1.31 m/s AV Area (Vmean):   0.91 cm      PV Peak grad:  6.9 mmHg AV Area (VTI):     0.99 cm AV Vmax:           384.50 cm/s AV Vmean:          273.333 cm/s AV VTI:  0.964 m AV Peak Grad:      59.1 mmHg AV Mean Grad:      35.5 mmHg LVOT Vmax:         128.50 cm/s LVOT Vmean:        79.100 cm/s LVOT VTI:          0.303 m LVOT/AV VTI ratio: 0.31  AORTA Ao Root diam: 2.80 cm Ao Asc diam:  3.60 cm MITRAL VALVE                   TRICUSPID VALVE MV Area (PHT): 3.93 cm       TR Peak grad:   49.3 mmHg MV Area VTI:   1.65 cm       TR Vmax:        351.00 cm/s MV Peak grad:  22.5 mmHg MV Mean grad:  9.5 mmHg       SHUNTS MV Vmax:       2.37 m/s       Systemic VTI:  0.30 m MV Vmean:      144.0 cm/s     Systemic Diam: 2.00 cm MR Peak grad:    155.8 mmHg MR Mean grad:    94.0 mmHg MR Vmax:         624.00 cm/s MR Vmean:        442.0 cm/s MR PISA:         2.26 cm MR PISA Eff ROA: 33 mm MR PISA Radius:  0.60 cm Cherlynn Kaiser MD Electronically signed by Cherlynn Kaiser MD Signature Date/Time: 10/13/2022/3:47:34 PM    Final    DG Chest Port 1 View  Result Date: 10/13/2022 CLINICAL DATA:  71 year old male with history of shortness of breath. EXAM: PORTABLE CHEST 1 VIEW COMPARISON:  Chest x-ray 11/26/2021. FINDINGS: There is cephalization of the pulmonary vasculature and slight indistinctness of the interstitial markings suggestive of mild pulmonary edema. No confluent consolidative airspace disease. Trace right pleural effusion. No definite left pleural effusion. No pneumothorax. Heart size is upper limits of normal. Upper mediastinal contours are within normal limits. Atherosclerotic calcifications are noted in the thoracic aorta. IMPRESSION: 1. The appearance of the chest suggest pulmonary edema. Given the normal cardiac size, this may reflect noncardiogenic pulmonary edema, fluid overload, or potential diastolic heart failure. Further clinical evaluation is recommended. Electronically Signed   By: Vinnie Langton M.D.   On: 10/13/2022 08:26     Discharge Exam: Vitals:   10/14/22 0816 10/14/22 0839  BP: (!) 127/93 (!) 144/74  Pulse: 98 82  Resp:  18  Temp:  99 F (37.2 C)  SpO2:  98%   Vitals:   10/13/22 2235 10/14/22 0436 10/14/22 0816 10/14/22 0839  BP: 136/70 127/61 (!) 127/93 (!) 144/74  Pulse: 79 77 98 82  Resp: 20 18  18   Temp: 97.8 F (36.6 C) 98.1 F (36.7 C)  99 F (37.2 C)  TempSrc: Oral Oral  Oral  SpO2:  100% 97%  98%  Weight:  85.8 kg    Height:        General: Pt is alert, awake, not in acute distress Cardiovascular: RRR, S1/S2 +, no rubs, no gallops Respiratory: CTA bilaterally, no wheezing, no rhonchi Abdominal: Soft, NT, ND, bowel sounds + Extremities: no edema, no cyanosis    The results of significant diagnostics from this hospitalization (including imaging, microbiology, ancillary and laboratory) are listed below for reference.     Microbiology: Recent Results (from the past 240 hour(s))  Resp Panel by RT-PCR (Flu A&B, Covid) Anterior Nasal Swab     Status: None   Collection Time: 10/13/22  8:05 AM   Specimen: Anterior Nasal Swab  Result Value Ref Range Status   SARS Coronavirus 2 by RT PCR NEGATIVE NEGATIVE Final    Comment: (NOTE) SARS-CoV-2 target nucleic acids are NOT DETECTED.  The SARS-CoV-2 RNA is generally detectable in upper respiratory specimens during the acute phase of infection. The lowest concentration of SARS-CoV-2 viral copies this assay can detect is 138 copies/mL. A negative result does not preclude SARS-Cov-2 infection and should not be used as the sole basis for treatment or other patient management decisions. A negative result may occur with  improper specimen collection/handling, submission of specimen other than nasopharyngeal swab, presence of viral mutation(s) within the areas targeted by this assay, and inadequate number of viral copies(<138 copies/mL). A negative result must be combined with clinical observations, patient history, and epidemiological information. The expected result is Negative.  Fact Sheet for Patients:  EntrepreneurPulse.com.au  Fact Sheet for Healthcare Providers:  IncredibleEmployment.be  This test is no t yet approved or cleared by the [place] FDA and  has been authorized for detection and/or diagnosis of SARS-CoV-2 by FDA under an Emergency Use Authorization (EUA). This  EUA will remain  in effect (meaning this test can be used) for the duration of the COVID-19 declaration under Section 564(b)(1) of the Act, 21 U.S.C.section 360bbb-3(b)(1), unless the authorization is terminated  or revoked sooner.       Influenza A by PCR NEGATIVE NEGATIVE Final   Influenza B by PCR NEGATIVE NEGATIVE Final    Comment: (NOTE) The Xpert Xpress SARS-CoV-2/FLU/RSV plus assay is intended as an aid in the diagnosis of influenza from Nasopharyngeal swab specimens and should not be used as a sole basis for treatment. Nasal washings and aspirates are unacceptable for Xpert Xpress SARS-CoV-2/FLU/RSV testing.  Fact Sheet for Patients: EntrepreneurPulse.com.au  Fact Sheet for Healthcare Providers: IncredibleEmployment.be  This test is not yet approved or cleared by the [place] FDA and has been authorized for detection and/or diagnosis of SARS-CoV-2 by FDA under an Emergency Use Authorization (EUA). This EUA will remain in effect (meaning this test can be used) for the duration of the COVID-19 declaration under Section 564(b)(1) of the Act, 21 U.S.C. section 360bbb-3(b)(1), unless the authorization is terminated or revoked.  Performed at [facility], [address], [place], [place] [address]      Labs: BNP (last 3 results) Recent Labs    10/13/22 0805  BNP 7,510.2*   Basic Metabolic Panel: Recent Labs  Lab 10/13/22 0805 10/14/22 0355  NA 136 136  K 4.6 4.1  CL 96* 96*  CO2 24 27  GLUCOSE 121* 110*  BUN 51* 29*  CREATININE 12.34* 6.55*  CALCIUM 8.4* 8.6*  MG  --  1.8   Liver Function Tests: No results for input(s): "AST", "ALT", "ALKPHOS", "BILITOT", "PROT", "ALBUMIN" in the last 168 hours. No results for input(s): "LIPASE", "AMYLASE" in the last 168 hours. No results for input(s): "AMMONIA" in the last 168 hours. CBC: Recent Labs  Lab 10/13/22 0805 10/14/22 0355  WBC 11.5* 7.4  NEUTROABS 10.2*  --   HGB  11.1* 9.5*  HCT 33.4* 28.1*  MCV 99.1 99.3  PLT 235 236   Cardiac Enzymes: No results for input(s): "CKTOTAL", "CKMB", "CKMBINDEX", "TROPONINI" in the last 168 hours. BNP: Invalid input(s): "POCBNP" CBG: No results for input(s): "GLUCAP" in the last 168 hours. D-Dimer No  results for input(s): "DDIMER" in the last 72 hours. Hgb A1c No results for input(s): "HGBA1C" in the last 72 hours. Lipid Profile No results for input(s): "CHOL", "HDL", "LDLCALC", "TRIG", "CHOLHDL", "LDLDIRECT" in the last 72 hours. Thyroid function studies No results for input(s): "TSH", "T4TOTAL", "T3FREE", "THYROIDAB" in the last 72 hours.  Invalid input(s): "FREET3" Anemia work up No results for input(s): "VITAMINB12", "FOLATE", "FERRITIN", "TIBC", "IRON", "RETICCTPCT" in the last 72 hours. Urinalysis    Component Value Date/Time   COLORURINE YELLOW 04/15/2020 2200   APPEARANCEUR TURBID (A) 04/15/2020 2200   LABSPEC 1.010 04/15/2020 2200   PHURINE 6.0 04/15/2020 2200   GLUCOSEU NEGATIVE 04/15/2020 2200   HGBUR SMALL (A) 04/15/2020 2200   BILIRUBINUR NEGATIVE 04/15/2020 2200   KETONESUR NEGATIVE 04/15/2020 2200   PROTEINUR 100 (A) 04/15/2020 2200   NITRITE POSITIVE (A) 04/15/2020 2200   LEUKOCYTESUR LARGE (A) 04/15/2020 2200   Sepsis Labs Recent Labs  Lab 10/13/22 0805 10/14/22 0355  WBC 11.5* 7.4   Microbiology Recent Results (from the past 240 hour(s))  Resp Panel by RT-PCR (Flu A&B, Covid) Anterior Nasal Swab     Status: None   Collection Time: 10/13/22  8:05 AM   Specimen: Anterior Nasal Swab  Result Value Ref Range Status   SARS Coronavirus 2 by RT PCR NEGATIVE NEGATIVE Final    Comment: (NOTE) SARS-CoV-2 target nucleic acids are NOT DETECTED.  The SARS-CoV-2 RNA is generally detectable in upper respiratory specimens during the acute phase of infection. The lowest concentration of SARS-CoV-2 viral copies this assay can detect is 138 copies/mL. A negative result does not preclude  SARS-Cov-2 infection and should not be used as the sole basis for treatment or other patient management decisions. A negative result may occur with  improper specimen collection/handling, submission of specimen other than nasopharyngeal swab, presence of viral mutation(s) within the areas targeted by this assay, and inadequate number of viral copies(<138 copies/mL). A negative result must be combined with clinical observations, patient history, and epidemiological information. The expected result is Negative.  Fact Sheet for Patients:  EntrepreneurPulse.com.au  Fact Sheet for Healthcare Providers:  IncredibleEmployment.be  This test is no t yet approved or cleared by the [place] FDA and  has been authorized for detection and/or diagnosis of SARS-CoV-2 by FDA under an Emergency Use Authorization (EUA). This EUA will remain  in effect (meaning this test can be used) for the duration of the COVID-19 declaration under Section 564(b)(1) of the Act, 21 U.S.C.section 360bbb-3(b)(1), unless the authorization is terminated  or revoked sooner.       Influenza A by PCR NEGATIVE NEGATIVE Final   Influenza B by PCR NEGATIVE NEGATIVE Final    Comment: (NOTE) The Xpert Xpress SARS-CoV-2/FLU/RSV plus assay is intended as an aid in the diagnosis of influenza from Nasopharyngeal swab specimens and should not be used as a sole basis for treatment. Nasal washings and aspirates are unacceptable for Xpert Xpress SARS-CoV-2/FLU/RSV testing.  Fact Sheet for Patients: EntrepreneurPulse.com.au  Fact Sheet for Healthcare Providers: IncredibleEmployment.be  This test is not yet approved or cleared by the [place] FDA and has been authorized for detection and/or diagnosis of SARS-CoV-2 by FDA under an Emergency Use Authorization (EUA). This EUA will remain in effect (meaning this test can be used) for the duration of  the COVID-19 declaration under Section 564(b)(1) of the Act, 21 U.S.C. section 360bbb-3(b)(1), unless the authorization is terminated or revoked.  Performed at [facility], [address] [place], [place] [address]  Time coordinating discharge: 35 minutes  SIGNED:   Rodena Goldmann, DO [facility] 10/14/2022, 9:11 AM  If 7PM-7AM, please contact night-coverage [email or web address]

## 2022-10-14 NOTE — Care Management Obs Status (Signed)
[place] NOTIFICATION   Patient Details  Name: Kerry Underwood MRN: [id number] Date of Birth: Dec 28, 1950   Medicare Observation Status Notification Given:  Yes    Iona Beard, [place] 10/14/2022, 10:04 AM

## 2022-10-15 NOTE — Progress Notes (Deleted)
Cardiology Office Note:    Date:  10/15/2022   ID:  [name], DOB April 07, 1951, MRN [id number]  PCP:  [name], [place] Providers Cardiologist:  Freada Bergeron, MD {  Referring MD: [name], FNP    History of Present Illness:    Zohaib Heeney is a 71 y.o. male with a hx of PAD (s/p right femoral endarterectomy and fem-pop bypass in 09/2021) HTN, COPD, ESRD and tobacco use who presents for follow-up.  He was admitted to [facility] in 11/2021 for acute hypoxic respiratory failure as he had been evaluated by his PCP and hypoxic at 60% on room air. He was found to be positive for influenza A and also to have a COPD exacerbation. Cardiology was consulted as his Hs Troponin values peaked at 546 but EKG was without acute ST changes.  His enzyme elevation was felt to be secondary to demand ischemia in the setting of his acute illness. A follow-up echocardiogram was obtained and showed a preserved EF of 60 to 65% with no regional wall motion abnormalities. He did have grade 2 diastolic dysfunction, normal RV function, mildly elevated PASP of 39 mmHg, mild MR, mild MS, and moderate aortic valve stenosis. He was continued on ASA 81 mg daily, Plavix 75 mg daily and Lipitor 40 mg daily with Lopressor being transitioned to Toprol-XL 22m daily.  Was last seen in clinic by BBernerd Pho PA-C on 12/2021 where his breathing was improved. Was off oxygen. No anginal symptoms. Declined further ischemic testing at that time.  Today, ***    Past Medical History:  Diagnosis Date   Anemia    CHF (congestive heart failure) ([place]    COPD (chronic obstructive pulmonary disease) ([facility]    [place] ([facility]    Full dentures    Hypertension    Pneumonia    Pyelonephritis 09/11/2017   Renal disorder    Stroke ([facility]    " A few mini strokes, I didn't know it "    Past Surgical History:  Procedure Laterality Date   ABDOMINAL AORTOGRAM W/LOWER EXTREMITY  Bilateral 09/20/2021   Procedure: ABDOMINAL AORTOGRAM W/LOWER EXTREMITY;  Surgeon: RBroadus John MD;  [facility];  Service: Cardiovascular;  Laterality: Bilateral;   AMPUTATION TOE Right 09/22/2021   Procedure: PARTIAL AMPUTATION SECOND RIGHT TOE;  Surgeon: CMarty Heck MD;  [place]  Service: Vascular;  Laterality: Right;   [place] 04/19/2020   Procedure: Basilic Vein Transposition Right Arm;  Surgeon: CWaynetta Sandy MD;  Location: [facility]  Service: Vascular;  Laterality: Right;   [place] 02/13/2022   Procedure: RIGHT ARM SECOND STAGE BASILIC VEIN TRANSPOSITION;  Surgeon: ERosetta Posner MD;  Location: [facility];  Service: Vascular;  Laterality: Right;   COLON SURGERY     ENDARTERECTOMY FEMORAL Right 09/22/2021   Procedure: ENDARTERECTOMY FEMORAL WITH PATCH ANGIOPLASTY USING 1X6CM XWeyman Pedro  Surgeon: CMarty Heck MD;  Location: [facility]  Service: Vascular;  Laterality: Right;   FEMORAL-TIBIAL BYPASS GRAFT Right 09/22/2021   Procedure: RIGHT FEMORAL TO BELOW KNEE POPLITEAL BYPASS WITH HARVEST OF GREATER SAPHENOUS VEIN;  Surgeon: CMarty Heck MD;  [place]  Service: Vascular;  Laterality: Right;   INSERTION OF DIALYSIS CATHETER Right 04/19/2020   Procedure: Insertion Of Right Internal Jugular Dialysis Catheter;  Surgeon: CWaynetta Sandy MD;  [place]  Service: Vascular;  Laterality: Right;  MULTIPLE TOOTH EXTRACTIONS     PERIPHERAL VASCULAR BALLOON ANGIOPLASTY Right 09/20/2021   Procedure: PERIPHERAL VASCULAR BALLOON ANGIOPLASTY;  Surgeon: Broadus John, MD;  Location: [place] [facility];  Service: Cardiovascular;  Laterality: Right;  SFA  FAILED   REMOVAL OF A DIALYSIS CATHETER N/A 05/01/2022   Procedure: MINOR REMOVAL OF A TUNNELED DIALYSIS CATHETER;  Surgeon: Rosetta Posner, MD;  Location: [facility];  Service: Vascular;  Laterality: N/A;    Current  Medications: No outpatient medications have been marked as taking for the 10/16/22 encounter (Appointment) with Freada Bergeron, MD.     Allergies:   Strawberry (diagnostic)   Social History   Socioeconomic History   Marital status: Single    Spouse name: Not on file   Number of children: Not on file   Years of education: Not on file   Highest education level: Not on file  Occupational History   Occupation: retired  Tobacco Use   Smoking status: Every Day    Packs/day: 0.50    Years: 62.00    Total pack years: 31.00    Types: Cigarettes   Smokeless tobacco: Former    Types: Chew   Tobacco comments:    Started smoking at age 64 - rolls his own tobacco cigarettes   Vaping Use   Vaping Use: Never used  Substance and Sexual Activity   Alcohol use: No   Drug use: Yes    Types: Marijuana    Comment: once every 3-4 weeks   Sexual activity: Not Currently    Birth control/protection: None  Other Topics Concern   Not on file  Social History Narrative   Lives alone. No family nearby. He does get out and socialize with friends.    Doesn't have  a good relationship with his children   Social Determinants of Health   Financial Resource Strain: Medium Risk (11/22/2021)   Overall Financial Resource Strain (CARDIA)    Difficulty of Paying Living Expenses: Somewhat hard  Food Insecurity: No Food Insecurity (10/13/2022)   Hunger Vital Sign    Worried About Running Out of Food in the Last Year: Never true    Ran Out of Food in the Last Year: Never true  Transportation Needs: No Transportation Needs (10/13/2022)   PRAPARE - Hydrologist (Medical): No    Lack of Transportation (Non-Medical): No  Physical Activity: Sufficiently Active (11/22/2021)   Exercise Vital Sign    Days of Exercise per Week: 5 days    Minutes of Exercise per Session: 30 min  Stress: No Stress Concern Present (11/22/2021)   [place]    Feeling of Stress : Not at all  Social Connections: Socially Isolated (11/22/2021)   Social Connection and Isolation Panel [NHANES]    Frequency of Communication with Friends and Family: More than three times a week    Frequency of Social Gatherings with Friends and Family: More than three times a week    Attends Religious Services: Never    Marine scientist or Organizations: No    Attends Archivist Meetings: Never    Marital Status: Never married     Family History: The patient's ***family history includes Alzheimer's disease in his mother.  ROS:   Please see the history of present illness.    *** All other systems reviewed and are negative.  EKGs/Labs/Other Studies Reviewed:    The following studies were reviewed  today: Echocardiogram: 11/25/2021 IMPRESSIONS     1. Left ventricular ejection fraction, by estimation, is 60 to 65%. The  left ventricle has normal function. The left ventricle has no regional  wall motion abnormalities. There is mild left ventricular hypertrophy.  Left ventricular diastolic parameters  are consistent with Grade II diastolic dysfunction (pseudonormalization).   2. Right ventricular systolic function is normal. The right ventricular  size is normal. There is mildly elevated pulmonary artery systolic  pressure. The estimated right ventricular systolic pressure is 10.6 mmHg.   3. Left atrial size was mildly dilated.   4. The mitral valve is degenerative. Mild mitral valve regurgitation.  Mild calcific mitral stenosis. Mean gradient 91mHg at HR 71; MVA 1.9cm2 by  continuity. Severe mitral annular calcification.   5. The aortic valve is calcified. There is moderate-to-severe  calcification of the aortic valve. There is moderate-to-severe thickening  of the aortic valve. Aortic valve regurgitation is not visualized.  Moderate aortic valve stenosis. AVA 0.91cm2, mean  gradient 263mg, peak gradient 4559m, Vmax  3.67m/9mDI 0.3.   6. The inferior vena cava is dilated in size with >50% respiratory  variability, suggesting right atrial pressure of 8 mmHg.   Comparison(s): No prior Echocardiogram.  EKG:  EKG is *** ordered today.  The ekg ordered today demonstrates ***  Recent Labs: 10/13/2022: B Natriuretic Peptide 2,290.0 10/14/2022: BUN 29; Creatinine, Ser 6.55; Hemoglobin 9.5; Magnesium 1.8; Platelets 236; Potassium 4.1; Sodium 136  Recent Lipid Panel    Component Value Date/Time   CHOL 91 09/23/2021 0427   CHOL 146 08/23/2021 1208   TRIG 91 09/23/2021 0427   HDL 19 (L) 09/23/2021 0427   HDL 30 (L) 08/23/2021 1208   CHOLHDL 4.8 09/23/2021 0427   VLDL 18 09/23/2021 0427   LDLCALC 54 09/23/2021 0427   LDLCALC 90 08/23/2021 1208     Risk Assessment/Calculations:   {Does this patient have ATRIAL FIBRILLATION?:[phone]}            Physical Exam:    VS:  There were no vitals taken for this visit.    Wt Readings from Last 3 Encounters:  10/14/22 189 lb 2.5 oz (85.8 kg)  09/12/22 186 lb 3.2 oz (84.5 kg)  02/09/22 170 lb (77.1 kg)     GEN: *** Well nourished, well developed in no acute distress HEENT: Normal NECK: No JVD; No carotid bruits LYMPHATICS: No lymphadenopathy CARDIAC: ***RRR, no murmurs, rubs, gallops RESPIRATORY:  Clear to auscultation without rales, wheezing or rhonchi  ABDOMEN: Soft, non-tender, non-distended MUSCULOSKELETAL:  No edema; No deformity  SKIN: Warm and dry NEUROLOGIC:  Alert and oriented x 3 PSYCHIATRIC:  Normal affect   ASSESSMENT:    No diagnosis found. PLAN:    In order of problems listed above:  #Elevated Troponin: Occurred during admission in 11/2021 where he presented with influenza A and COPD exacerbation. Trop elevation thought to be due to demand as patient was very active prior to his hospitalization with no anginal symptoms. TTE 10/2022 with LVEF 70-75%, mild LVH, moderate pulmonary HTN, mild to moderate MR, moderate MS with mean  gradient 9,5mmH30mt HR 82, moderate to severe MS with mean gradient 35.5, Vmax 3.84. -Will defer ischemic work-up to out-patient -Continue ASA 81mg 69my, plavix 75mg d60m -Stopped heparin gtt -Continue lipitor 40mg da867m-Change metop tartrate to 25mg XL 82my    #Moderate to severe AS: TTE 10/2022 with mean gradient 35.5, Vmax 3.84. Currently, *** -Needs repeat TTE in 6 months  #Moderate MS: #Mild  to moderate MR: TTE with severe MAC with mean mitral gradient 9.57mHg at HR 82 with mild-to-moderate MR. -Continue serial monitoring   #PAD: -Continue ASA, plavix and lipitor as above   #ESRD: -On HD T, Th, S   #HTN: -Continue home amlodipine 517mdaily -Metop 2544mL daily   #HLD: -Continue lipitor 8m33mily as above        {Are you ordering a CV Procedure (e.g. stress test, cath, DCCV, TEE, etc)?   Press F2        :[id number]}Medication Adjustments/Labs and Tests Ordered: Current medicines are reviewed at length with the patient today.  Concerns regarding medicines are outlined above.  No orders of the defined types were placed in this encounter.  No orders of the defined types were placed in this encounter.   There are no Patient Instructions on file for this visit.   Signed, HeatFreada Bergeron  10/15/2022 12:59 PM    [place]

## 2022-10-16 ENCOUNTER — Ambulatory Visit: Payer: 59 | Admitting: Cardiology

## 2022-10-17 ENCOUNTER — Other Ambulatory Visit: Payer: Self-pay

## 2022-10-17 ENCOUNTER — Emergency Department (HOSPITAL_COMMUNITY): Payer: 59

## 2022-10-17 ENCOUNTER — Encounter (HOSPITAL_COMMUNITY): Payer: Self-pay

## 2022-10-17 ENCOUNTER — Inpatient Hospital Stay (HOSPITAL_COMMUNITY)
Admission: EM | Admit: 2022-10-17 | Discharge: 2022-10-24 | DRG: 280 | Disposition: A | Discharge status: Home / Self Care | Payer: 59 | Attending: Family Medicine | Admitting: Family Medicine

## 2022-10-17 DIAGNOSIS — J439 Emphysema, unspecified: Secondary | ICD-10-CM | POA: Diagnosis present

## 2022-10-17 DIAGNOSIS — Z992 Dependence on renal dialysis: Secondary | ICD-10-CM

## 2022-10-17 DIAGNOSIS — I2510 Atherosclerotic heart disease of native coronary artery without angina pectoris: Secondary | ICD-10-CM | POA: Diagnosis not present

## 2022-10-17 DIAGNOSIS — I350 Nonrheumatic aortic (valve) stenosis: Secondary | ICD-10-CM

## 2022-10-17 DIAGNOSIS — Z01810 Encounter for preprocedural cardiovascular examination: Secondary | ICD-10-CM | POA: Diagnosis not present

## 2022-10-17 DIAGNOSIS — N2581 Secondary hyperparathyroidism of renal origin: Secondary | ICD-10-CM | POA: Diagnosis present

## 2022-10-17 DIAGNOSIS — I080 Rheumatic disorders of both mitral and aortic valves: Secondary | ICD-10-CM | POA: Diagnosis present

## 2022-10-17 DIAGNOSIS — I358 Other nonrheumatic aortic valve disorders: Secondary | ICD-10-CM | POA: Diagnosis not present

## 2022-10-17 DIAGNOSIS — M898X9 Other specified disorders of bone, unspecified site: Secondary | ICD-10-CM | POA: Diagnosis present

## 2022-10-17 DIAGNOSIS — I272 Pulmonary hypertension, unspecified: Secondary | ICD-10-CM | POA: Diagnosis present

## 2022-10-17 DIAGNOSIS — K7460 Unspecified cirrhosis of liver: Secondary | ICD-10-CM | POA: Diagnosis present

## 2022-10-17 DIAGNOSIS — Z89421 Acquired absence of other right toe(s): Secondary | ICD-10-CM | POA: Diagnosis not present

## 2022-10-17 DIAGNOSIS — N186 End stage renal disease: Secondary | ICD-10-CM | POA: Diagnosis present

## 2022-10-17 DIAGNOSIS — J9811 Atelectasis: Secondary | ICD-10-CM | POA: Diagnosis present

## 2022-10-17 DIAGNOSIS — Z951 Presence of aortocoronary bypass graft: Secondary | ICD-10-CM | POA: Diagnosis not present

## 2022-10-17 DIAGNOSIS — F17210 Nicotine dependence, cigarettes, uncomplicated: Secondary | ICD-10-CM | POA: Diagnosis present

## 2022-10-17 DIAGNOSIS — I252 Old myocardial infarction: Secondary | ICD-10-CM | POA: Diagnosis not present

## 2022-10-17 DIAGNOSIS — Z79899 Other long term (current) drug therapy: Secondary | ICD-10-CM

## 2022-10-17 DIAGNOSIS — Z7982 Long term (current) use of aspirin: Secondary | ICD-10-CM

## 2022-10-17 DIAGNOSIS — I214 Non-ST elevation (NSTEMI) myocardial infarction: Principal | ICD-10-CM | POA: Diagnosis present

## 2022-10-17 DIAGNOSIS — J9621 Acute and chronic respiratory failure with hypoxia: Secondary | ICD-10-CM | POA: Diagnosis present

## 2022-10-17 DIAGNOSIS — E785 Hyperlipidemia, unspecified: Secondary | ICD-10-CM | POA: Diagnosis present

## 2022-10-17 DIAGNOSIS — D631 Anemia in chronic kidney disease: Secondary | ICD-10-CM | POA: Diagnosis present

## 2022-10-17 DIAGNOSIS — J449 Chronic obstructive pulmonary disease, unspecified: Secondary | ICD-10-CM | POA: Diagnosis present

## 2022-10-17 DIAGNOSIS — Z66 Do not resuscitate: Secondary | ICD-10-CM | POA: Diagnosis present

## 2022-10-17 DIAGNOSIS — Z8673 Personal history of transient ischemic attack (TIA), and cerebral infarction without residual deficits: Secondary | ICD-10-CM

## 2022-10-17 DIAGNOSIS — J9601 Acute respiratory failure with hypoxia: Principal | ICD-10-CM | POA: Diagnosis present

## 2022-10-17 DIAGNOSIS — I151 Hypertension secondary to other renal disorders: Secondary | ICD-10-CM | POA: Diagnosis present

## 2022-10-17 DIAGNOSIS — I5033 Acute on chronic diastolic (congestive) heart failure: Secondary | ICD-10-CM | POA: Diagnosis present

## 2022-10-17 DIAGNOSIS — I739 Peripheral vascular disease, unspecified: Secondary | ICD-10-CM | POA: Diagnosis present

## 2022-10-17 DIAGNOSIS — I5032 Chronic diastolic (congestive) heart failure: Secondary | ICD-10-CM | POA: Diagnosis not present

## 2022-10-17 DIAGNOSIS — R008 Other abnormalities of heart beat: Secondary | ICD-10-CM | POA: Diagnosis present

## 2022-10-17 DIAGNOSIS — I493 Ventricular premature depolarization: Secondary | ICD-10-CM | POA: Diagnosis present

## 2022-10-17 DIAGNOSIS — Z820 Family history of epilepsy and other diseases of the nervous system: Secondary | ICD-10-CM

## 2022-10-17 DIAGNOSIS — I351 Nonrheumatic aortic (valve) insufficiency: Secondary | ICD-10-CM | POA: Diagnosis not present

## 2022-10-17 DIAGNOSIS — I2584 Coronary atherosclerosis due to calcified coronary lesion: Secondary | ICD-10-CM | POA: Diagnosis present

## 2022-10-17 DIAGNOSIS — E8729 Other acidosis: Secondary | ICD-10-CM | POA: Diagnosis present

## 2022-10-17 DIAGNOSIS — I132 Hypertensive heart and chronic kidney disease with heart failure and with stage 5 chronic kidney disease, or end stage renal disease: Secondary | ICD-10-CM | POA: Diagnosis present

## 2022-10-17 DIAGNOSIS — I25118 Atherosclerotic heart disease of native coronary artery with other forms of angina pectoris: Secondary | ICD-10-CM | POA: Diagnosis not present

## 2022-10-17 DIAGNOSIS — R54 Age-related physical debility: Secondary | ICD-10-CM | POA: Diagnosis present

## 2022-10-17 DIAGNOSIS — I2489 Other forms of acute ischemic heart disease: Secondary | ICD-10-CM | POA: Diagnosis present

## 2022-10-17 LAB — CBC WITH DIFFERENTIAL/PLATELET
Abs Immature Granulocytes: 0.08 10*3/uL — ABNORMAL HIGH (ref 0.00–0.07)
Basophils Absolute: 0 10*3/uL (ref 0.0–0.1)
Basophils Relative: 0 %
Eosinophils Absolute: 0 10*3/uL (ref 0.0–0.5)
Eosinophils Relative: 0 %
HCT: 34.6 % — ABNORMAL LOW (ref 39.0–52.0)
Hemoglobin: 11.7 g/dL — ABNORMAL LOW (ref 13.0–17.0)
Immature Granulocytes: 1 %
Lymphocytes Relative: 8 %
Lymphs Abs: 1.1 10*3/uL (ref 0.7–4.0)
MCH: 33.3 pg (ref 26.0–34.0)
MCHC: 33.8 g/dL (ref 30.0–36.0)
MCV: 98.6 fL (ref 80.0–100.0)
Monocytes Absolute: 0.4 10*3/uL (ref 0.1–1.0)
Monocytes Relative: 3 %
Neutro Abs: 11.5 10*3/uL — ABNORMAL HIGH (ref 1.7–7.7)
Neutrophils Relative %: 88 %
Platelets: 377 10*3/uL (ref 150–400)
RBC: 3.51 MIL/uL — ABNORMAL LOW (ref 4.22–5.81)
RDW: 14.7 % (ref 11.5–15.5)
WBC: 13 10*3/uL — ABNORMAL HIGH (ref 4.0–10.5)
nRBC: 0 % (ref 0.0–0.2)

## 2022-10-17 LAB — BLOOD GAS, VENOUS
Acid-Base Excess: 2.1 mmol/L — ABNORMAL HIGH (ref 0.0–2.0)
Bicarbonate: 29.5 mmol/L — ABNORMAL HIGH (ref 20.0–28.0)
Drawn by: 66797
O2 Saturation: 46.7 %
Patient temperature: 36.6
pCO2, Ven: 59 mmHg (ref 44–60)
pH, Ven: 7.31 (ref 7.25–7.43)
pO2, Ven: <31 mmHg — CL (ref 32–45)

## 2022-10-17 LAB — BRAIN NATRIURETIC PEPTIDE: B Natriuretic Peptide: 1821 pg/mL — ABNORMAL HIGH (ref 0.0–100.0)

## 2022-10-17 LAB — COMPREHENSIVE METABOLIC PANEL
ALT: 19 U/L (ref 0–44)
AST: 40 U/L (ref 15–41)
Albumin: 3.4 g/dL — ABNORMAL LOW (ref 3.5–5.0)
Alkaline Phosphatase: 59 U/L (ref 38–126)
Anion gap: 17 — ABNORMAL HIGH (ref 5–15)
BUN: 62 mg/dL — ABNORMAL HIGH (ref 8–23)
CO2: 25 mmol/L (ref 22–32)
Calcium: 8.3 mg/dL — ABNORMAL LOW (ref 8.9–10.3)
Chloride: 91 mmol/L — ABNORMAL LOW (ref 98–111)
Creatinine, Ser: 8.75 mg/dL — ABNORMAL HIGH (ref 0.61–1.24)
GFR, Estimated: 6 mL/min — ABNORMAL LOW (ref >60)
Glucose, Bld: 230 mg/dL — ABNORMAL HIGH (ref 70–99)
Potassium: 5.1 mmol/L (ref 3.5–5.1)
Sodium: 133 mmol/L — ABNORMAL LOW (ref 135–145)
Total Bilirubin: 0.6 mg/dL (ref 0.3–1.2)
Total Protein: 7.9 g/dL (ref 6.5–8.1)

## 2022-10-17 LAB — TROPONIN I (HIGH SENSITIVITY)
Troponin I (High Sensitivity): 226 ng/L (ref <18)
Troponin I (High Sensitivity): 1286 ng/L (ref <18)

## 2022-10-17 MED ORDER — ACETAMINOPHEN 325 MG PO TABS: 650.0000 mg | ORAL_TABLET | Freq: Four times a day (QID) | ORAL | Status: DC | PRN

## 2022-10-17 MED ORDER — ONDANSETRON HCL 4 MG/2ML IJ SOLN: 4.0000 mg | Freq: Four times a day (QID) | INTRAMUSCULAR | Status: DC | PRN

## 2022-10-17 MED ORDER — LIDOCAINE-PRILOCAINE 2.5-2.5 % EX CREA: 1.0000 | TOPICAL_CREAM | CUTANEOUS | Status: DC | PRN

## 2022-10-17 MED ORDER — TAMSULOSIN HCL 0.4 MG PO CAPS
0.4000 mg | ORAL_CAPSULE | Freq: Every day | ORAL | Status: DC
  Administered 2022-10-18 – 2022-10-23 (×6): 0.4 mg via ORAL
  Filled 2022-10-17 (×7): qty 1

## 2022-10-17 MED ORDER — OXYCODONE HCL 5 MG PO TABS: 5.0000 mg | ORAL_TABLET | ORAL | Status: DC | PRN

## 2022-10-17 MED ORDER — PENTAFLUOROPROP-TETRAFLUOROETH EX AERO: 1.0000 | INHALATION_SPRAY | CUTANEOUS | Status: DC | PRN

## 2022-10-17 MED ORDER — METHYLPREDNISOLONE SODIUM SUCC 125 MG IJ SOLR: 125.0000 mg | Freq: Once | INTRAMUSCULAR | Status: DC

## 2022-10-17 MED ORDER — HEPARIN SODIUM (PORCINE) 5000 UNIT/ML IJ SOLN: 5000.0000 [IU] | Freq: Three times a day (TID) | INTRAMUSCULAR | Status: DC

## 2022-10-17 MED ORDER — HEPARIN SODIUM (PORCINE) 1000 UNIT/ML DIALYSIS: 1000.0000 [IU] | INTRAMUSCULAR | Status: DC | PRN

## 2022-10-17 MED ORDER — IPRATROPIUM-ALBUTEROL 0.5-2.5 (3) MG/3ML IN SOLN
9.0000 mL | Freq: Once | RESPIRATORY_TRACT | Status: AC
  Administered 2022-10-17: 9 mL via RESPIRATORY_TRACT
  Filled 2022-10-17: qty 9

## 2022-10-17 MED ORDER — FUROSEMIDE 10 MG/ML IJ SOLN
40.0000 mg | Freq: Once | INTRAMUSCULAR | Status: AC
  Administered 2022-10-17: 40 mg via INTRAVENOUS
  Filled 2022-10-17: qty 4

## 2022-10-17 MED ORDER — UMECLIDINIUM BROMIDE 62.5 MCG/ACT IN AEPB
1.0000 | INHALATION_SPRAY | Freq: Every day | RESPIRATORY_TRACT | Status: DC
  Administered 2022-10-21 – 2022-10-24 (×4): 1 via RESPIRATORY_TRACT
  Filled 2022-10-17 (×2): qty 7

## 2022-10-17 MED ORDER — ALBUTEROL SULFATE (2.5 MG/3ML) 0.083% IN NEBU
2.5000 mg | INHALATION_SOLUTION | RESPIRATORY_TRACT | Status: DC | PRN
  Administered 2022-10-18 – 2022-10-23 (×3): 2.5 mg via RESPIRATORY_TRACT
  Filled 2022-10-17 (×2): qty 3

## 2022-10-17 MED ORDER — ASPIRIN 325 MG PO TBEC: 325.0000 mg | DELAYED_RELEASE_TABLET | ORAL | Status: DC

## 2022-10-17 MED ORDER — LIDOCAINE HCL (PF) 1 % IJ SOLN: 5.0000 mL | INTRAMUSCULAR | Status: DC | PRN

## 2022-10-17 MED ORDER — TIOTROPIUM BROMIDE MONOHYDRATE 18 MCG IN CAPS: 18.0000 ug | ORAL_CAPSULE | Freq: Every day | RESPIRATORY_TRACT | Status: DC

## 2022-10-17 MED ORDER — METOPROLOL SUCCINATE ER 25 MG PO TB24
25.0000 mg | ORAL_TABLET | Freq: Every day | ORAL | Status: DC
  Administered 2022-10-18 – 2022-10-24 (×6): 25 mg via ORAL
  Filled 2022-10-17 (×7): qty 1

## 2022-10-17 MED ORDER — AMLODIPINE BESYLATE 2.5 MG PO TABS
2.5000 mg | ORAL_TABLET | Freq: Every day | ORAL | Status: DC
  Administered 2022-10-18 – 2022-10-23 (×6): 2.5 mg via ORAL
  Filled 2022-10-17 (×6): qty 1

## 2022-10-17 MED ORDER — CHLORHEXIDINE GLUCONATE CLOTH 2 % EX PADS: 6.0000 | MEDICATED_PAD | Freq: Every day | CUTANEOUS | Status: DC

## 2022-10-17 MED ORDER — MORPHINE SULFATE (PF) 2 MG/ML IV SOLN: 2.0000 mg | INTRAVENOUS | Status: DC | PRN

## 2022-10-17 MED ORDER — ACETAMINOPHEN 650 MG RE SUPP: 650.0000 mg | Freq: Four times a day (QID) | RECTAL | Status: DC | PRN

## 2022-10-17 MED ORDER — FLUTICASONE FUROATE-VILANTEROL 200-25 MCG/ACT IN AEPB
1.0000 | INHALATION_SPRAY | Freq: Every day | RESPIRATORY_TRACT | Status: DC
  Administered 2022-10-21 – 2022-10-24 (×4): 1 via RESPIRATORY_TRACT
  Filled 2022-10-17 (×2): qty 28

## 2022-10-17 MED ORDER — ONDANSETRON HCL 4 MG PO TABS: 4.0000 mg | ORAL_TABLET | Freq: Four times a day (QID) | ORAL | Status: DC | PRN

## 2022-10-17 NOTE — ED Notes (Signed)
Date and time results received: 10/17/22 0752   Test: PO2 Critical Value: <31  Name of Provider Notified: Kommer  Orders Received? Or Actions Taken?: NA

## 2022-10-17 NOTE — ED Triage Notes (Signed)
Pt via [facility] for respiratory distress. Pt with history of COPD and seen here over the weekend for same thing. When ems arrived pt was in low 80s and was placed on CPAP with oxygen in high 90s after. Pt given duoneb, solumedrol in route. Pt A&Ox4. Respiratory at bedside.   IV 18 left AC

## 2022-10-17 NOTE — ED Notes (Signed)
Date and time results received: 10/17/22 0809   Test: Troponin Critical Value: 226  Name of Provider Notified: Kommer  Orders Received? Or Actions Taken?: NA

## 2022-10-17 NOTE — ED Notes (Signed)
Date and time results received: 10/17/22 1027   Test: Troponin Critical Value: 1286  Name of Provider Notified: Dr Earnest Conroy  Orders Received? Or Actions Taken?: NA

## 2022-10-18 ENCOUNTER — Inpatient Hospital Stay (HOSPITAL_COMMUNITY): Payer: 59

## 2022-10-18 ENCOUNTER — Other Ambulatory Visit (HOSPITAL_COMMUNITY): Payer: Self-pay | Admitting: *Deleted

## 2022-10-18 ENCOUNTER — Encounter (HOSPITAL_COMMUNITY)
Admission: EM | Disposition: A | Discharge status: Home / Self Care | Payer: Self-pay | Source: Home / Self Care | Attending: Family Medicine

## 2022-10-18 DIAGNOSIS — N186 End stage renal disease: Secondary | ICD-10-CM

## 2022-10-18 DIAGNOSIS — J9601 Acute respiratory failure with hypoxia: Secondary | ICD-10-CM

## 2022-10-18 DIAGNOSIS — I214 Non-ST elevation (NSTEMI) myocardial infarction: Secondary | ICD-10-CM

## 2022-10-18 DIAGNOSIS — I2510 Atherosclerotic heart disease of native coronary artery without angina pectoris: Secondary | ICD-10-CM

## 2022-10-18 DIAGNOSIS — I5032 Chronic diastolic (congestive) heart failure: Secondary | ICD-10-CM

## 2022-10-18 DIAGNOSIS — I739 Peripheral vascular disease, unspecified: Secondary | ICD-10-CM

## 2022-10-18 DIAGNOSIS — J449 Chronic obstructive pulmonary disease, unspecified: Secondary | ICD-10-CM

## 2022-10-18 DIAGNOSIS — I151 Hypertension secondary to other renal disorders: Secondary | ICD-10-CM

## 2022-10-18 DIAGNOSIS — Z992 Dependence on renal dialysis: Secondary | ICD-10-CM

## 2022-10-18 DIAGNOSIS — I350 Nonrheumatic aortic (valve) stenosis: Secondary | ICD-10-CM

## 2022-10-18 HISTORY — PX: RIGHT/LEFT HEART CATH AND CORONARY ANGIOGRAPHY: CATH118266

## 2022-10-18 LAB — COMPREHENSIVE METABOLIC PANEL
ALT: 16 U/L (ref 0–44)
AST: 47 U/L — ABNORMAL HIGH (ref 15–41)
Albumin: 3 g/dL — ABNORMAL LOW (ref 3.5–5.0)
Alkaline Phosphatase: 51 U/L (ref 38–126)
Anion gap: 14 (ref 5–15)
BUN: 68 mg/dL — ABNORMAL HIGH (ref 8–23)
CO2: 24 mmol/L (ref 22–32)
Calcium: 8.2 mg/dL — ABNORMAL LOW (ref 8.9–10.3)
Chloride: 92 mmol/L — ABNORMAL LOW (ref 98–111)
Creatinine, Ser: 9.06 mg/dL — ABNORMAL HIGH (ref 0.61–1.24)
GFR, Estimated: 6 mL/min — ABNORMAL LOW (ref >60)
Glucose, Bld: 173 mg/dL — ABNORMAL HIGH (ref 70–99)
Potassium: 4.7 mmol/L (ref 3.5–5.1)
Sodium: 130 mmol/L — ABNORMAL LOW (ref 135–145)
Total Bilirubin: 0.4 mg/dL (ref 0.3–1.2)
Total Protein: 7 g/dL (ref 6.5–8.1)

## 2022-10-18 LAB — PROCALCITONIN: Procalcitonin: 0.27 ng/mL

## 2022-10-18 LAB — POCT I-STAT EG7
Acid-Base Excess: 6 mmol/L — ABNORMAL HIGH (ref 0.0–2.0)
Acid-Base Excess: 6 mmol/L — ABNORMAL HIGH (ref 0.0–2.0)
Bicarbonate: 30.3 mmol/L — ABNORMAL HIGH (ref 20.0–28.0)
Bicarbonate: 31.1 mmol/L — ABNORMAL HIGH (ref 20.0–28.0)
Calcium, Ion: 1.1 mmol/L — ABNORMAL LOW (ref 1.15–1.40)
Calcium, Ion: 1.11 mmol/L — ABNORMAL LOW (ref 1.15–1.40)
HCT: 26 % — ABNORMAL LOW (ref 39.0–52.0)
HCT: 27 % — ABNORMAL LOW (ref 39.0–52.0)
Hemoglobin: 8.8 g/dL — ABNORMAL LOW (ref 13.0–17.0)
Hemoglobin: 9.2 g/dL — ABNORMAL LOW (ref 13.0–17.0)
O2 Saturation: 71 %
O2 Saturation: 74 %
Potassium: 3.7 mmol/L (ref 3.5–5.1)
Potassium: 3.7 mmol/L (ref 3.5–5.1)
Sodium: 135 mmol/L (ref 135–145)
Sodium: 136 mmol/L (ref 135–145)
TCO2: 32 mmol/L (ref 22–32)
TCO2: 32 mmol/L (ref 22–32)
pCO2, Ven: 44.9 mmHg (ref 44–60)
pCO2, Ven: 45.3 mmHg (ref 44–60)
pH, Ven: 7.438 — ABNORMAL HIGH (ref 7.25–7.43)
pH, Ven: 7.445 — ABNORMAL HIGH (ref 7.25–7.43)
pO2, Ven: 37 mmHg (ref 32–45)
pO2, Ven: 38 mmHg (ref 32–45)

## 2022-10-18 LAB — POCT I-STAT 7, (LYTES, BLD GAS, ICA,H+H)
Acid-Base Excess: 6 mmol/L — ABNORMAL HIGH (ref 0.0–2.0)
Bicarbonate: 30.3 mmol/L — ABNORMAL HIGH (ref 20.0–28.0)
Calcium, Ion: 1.07 mmol/L — ABNORMAL LOW (ref 1.15–1.40)
HCT: 26 % — ABNORMAL LOW (ref 39.0–52.0)
Hemoglobin: 8.8 g/dL — ABNORMAL LOW (ref 13.0–17.0)
O2 Saturation: 99 %
Potassium: 3.6 mmol/L (ref 3.5–5.1)
Sodium: 136 mmol/L (ref 135–145)
TCO2: 32 mmol/L (ref 22–32)
pCO2 arterial: 43.5 mmHg (ref 32–48)
pH, Arterial: 7.451 — ABNORMAL HIGH (ref 7.35–7.45)
pO2, Arterial: 148 mmHg — ABNORMAL HIGH (ref 83–108)

## 2022-10-18 LAB — PHOSPHORUS: Phosphorus: 7.4 mg/dL — ABNORMAL HIGH (ref 2.5–4.6)

## 2022-10-18 LAB — D-DIMER, QUANTITATIVE: D-Dimer, Quant: 1.12 ug/mL-FEU — ABNORMAL HIGH (ref 0.00–0.50)

## 2022-10-18 LAB — ECHOCARDIOGRAM LIMITED
Height: 65 in
S' Lateral: 2.8 cm
Weight: 3015.89 oz

## 2022-10-18 LAB — HEPARIN LEVEL (UNFRACTIONATED): Heparin Unfractionated: 0.19 IU/mL — ABNORMAL LOW (ref 0.30–0.70)

## 2022-10-18 LAB — TROPONIN I (HIGH SENSITIVITY)
Troponin I (High Sensitivity): 3927 ng/L (ref <18)
Troponin I (High Sensitivity): 6053 ng/L (ref <18)

## 2022-10-18 LAB — MAGNESIUM: Magnesium: 2 mg/dL (ref 1.7–2.4)

## 2022-10-18 LAB — HEPATITIS B SURFACE ANTIGEN: Hepatitis B Surface Ag: NONREACTIVE

## 2022-10-18 SURGERY — RIGHT/LEFT HEART CATH AND CORONARY ANGIOGRAPHY
Anesthesia: LOCAL

## 2022-10-18 MED ORDER — IOHEXOL 350 MG/ML SOLN
INTRAVENOUS | Status: DC | PRN
  Administered 2022-10-18: 75 mL

## 2022-10-18 MED ORDER — ASPIRIN 81 MG PO TBEC
81.0000 mg | DELAYED_RELEASE_TABLET | Freq: Every day | ORAL | Status: DC
  Administered 2022-10-18 – 2022-10-24 (×7): 81 mg via ORAL
  Filled 2022-10-18 (×8): qty 1

## 2022-10-18 MED ORDER — FENTANYL CITRATE (PF) 100 MCG/2ML IJ SOLN
INTRAMUSCULAR | Status: DC | PRN
  Administered 2022-10-18: 25 ug via INTRAVENOUS

## 2022-10-18 MED ORDER — HEPARIN BOLUS VIA INFUSION
2000.0000 [IU] | Freq: Once | INTRAVENOUS | Status: AC
  Administered 2022-10-18: 2000 [IU] via INTRAVENOUS

## 2022-10-18 MED ORDER — MIDAZOLAM HCL 2 MG/2ML IJ SOLN
INTRAMUSCULAR | Status: DC | PRN
  Administered 2022-10-18: 1 mg via INTRAVENOUS

## 2022-10-18 MED ORDER — HYDRALAZINE HCL 20 MG/ML IJ SOLN: 10.0000 mg | INTRAMUSCULAR | Status: AC | PRN

## 2022-10-18 MED ORDER — HEPARIN (PORCINE) IN NACL 1000-0.9 UT/500ML-% IV SOLN
INTRAVENOUS | Status: DC | PRN
  Administered 2022-10-18 (×2): 500 mL

## 2022-10-18 MED ORDER — SODIUM CHLORIDE 0.9% FLUSH
3.0000 mL | INTRAVENOUS | Status: DC | PRN
  Administered 2022-10-21: 3 mL via INTRAVENOUS

## 2022-10-18 MED ORDER — LIDOCAINE HCL (PF) 1 % IJ SOLN
INTRAMUSCULAR | Status: AC
  Filled 2022-10-18: qty 30

## 2022-10-18 MED ORDER — CALCITRIOL 0.25 MCG PO CAPS
0.2500 ug | ORAL_CAPSULE | ORAL | Status: DC
  Administered 2022-10-18 – 2022-10-20 (×2): 0.25 ug via ORAL
  Filled 2022-10-18 (×2): qty 1

## 2022-10-18 MED ORDER — ROSUVASTATIN CALCIUM 20 MG PO TABS
40.0000 mg | ORAL_TABLET | Freq: Every day | ORAL | Status: DC
  Administered 2022-10-18: 40 mg via ORAL
  Filled 2022-10-18 (×2): qty 2

## 2022-10-18 MED ORDER — SODIUM CHLORIDE 0.9 % IV SOLN: 250.0000 mL | INTRAVENOUS | Status: DC | PRN

## 2022-10-18 MED ORDER — LIDOCAINE HCL (PF) 1 % IJ SOLN
INTRAMUSCULAR | Status: DC | PRN
  Administered 2022-10-18: 15 mL

## 2022-10-18 MED ORDER — SUCROFERRIC OXYHYDROXIDE 500 MG PO CHEW
500.0000 mg | CHEWABLE_TABLET | Freq: Three times a day (TID) | ORAL | Status: DC
  Administered 2022-10-24: 500 mg via ORAL
  Filled 2022-10-18 (×20): qty 1

## 2022-10-18 MED ORDER — SODIUM CHLORIDE 0.9% FLUSH
3.0000 mL | Freq: Two times a day (BID) | INTRAVENOUS | Status: DC
  Administered 2022-10-18 – 2022-10-23 (×7): 3 mL via INTRAVENOUS

## 2022-10-18 MED ORDER — HEPARIN (PORCINE) 25000 UT/250ML-% IV SOLN
1500.0000 [IU]/h | INTRAVENOUS | Status: DC
  Administered 2022-10-19: 1600 [IU]/h via INTRAVENOUS
  Administered 2022-10-19: 1350 [IU]/h via INTRAVENOUS
  Filled 2022-10-18 (×2): qty 250

## 2022-10-18 MED ORDER — HEPARIN (PORCINE) 25000 UT/250ML-% IV SOLN
1350.0000 [IU]/h | INTRAVENOUS | Status: DC
  Administered 2022-10-18: 1100 [IU]/h via INTRAVENOUS
  Filled 2022-10-18: qty 250

## 2022-10-18 MED ORDER — ASPIRIN 325 MG PO TBEC
325.0000 mg | DELAYED_RELEASE_TABLET | Freq: Once | ORAL | Status: AC
  Administered 2022-10-18: 325 mg via ORAL
  Filled 2022-10-18: qty 1

## 2022-10-18 MED ORDER — MIDAZOLAM HCL 2 MG/2ML IJ SOLN
INTRAMUSCULAR | Status: AC
  Filled 2022-10-18: qty 2

## 2022-10-18 MED ORDER — FENTANYL CITRATE (PF) 100 MCG/2ML IJ SOLN
INTRAMUSCULAR | Status: AC
  Filled 2022-10-18: qty 2

## 2022-10-18 SURGICAL SUPPLY — 13 items
CATH INFINITI 5FR MULTPACK ANG (CATHETERS) IMPLANT
CATH SWAN GANZ 7F STRAIGHT (CATHETERS) IMPLANT
KIT HEART LEFT (KITS) ×1 IMPLANT
KIT MICROPUNCTURE NIT STIFF (SHEATH) IMPLANT
PACK CARDIAC CATHETERIZATION (CUSTOM PROCEDURE TRAY) ×1 IMPLANT
SHEATH PINNACLE 5F 10CM (SHEATH) IMPLANT
SHEATH PINNACLE 7F 10CM (SHEATH) IMPLANT
SHEATH PROBE COVER 6X72 (BAG) IMPLANT
SYR MEDRAD MARK 7 150ML (SYRINGE) ×1 IMPLANT
TRANSDUCER W/STOPCOCK (MISCELLANEOUS) ×1 IMPLANT
TUBING CIL FLEX 10 FLL-RA (TUBING) ×1 IMPLANT
WIRE EMERALD 3MM-J .035X150CM (WIRE) IMPLANT
WIRE EMERALD ST .035X150CM (WIRE) IMPLANT

## 2022-10-18 NOTE — Progress Notes (Signed)
ANTICOAGULATION CONSULT NOTE - Initial Consult  Pharmacy Consult for heparin Indication: chest pain/ACS  Allergies  Allergen Reactions   Strawberry (Diagnostic) Rash    Patient Measurements: Height: 5\' 5"  (165.1 cm) Weight: 85.5 kg (188 lb 7.9 oz) IBW/kg (Calculated) : 61.5 Heparin Dosing Weight: 80kg  Vital Signs: Temp: 97.9 F (36.6 C) (11/08 1904) BP: 126/64 (11/09 0036) Pulse Rate: 80 (11/09 0036)  Labs: Recent Labs    10/17/22 1912 10/17/22 2104 10/17/22 2344  HGB 11.7*  --   --   HCT 34.6*  --   --   PLT 377  --   --   CREATININE 8.75*  --   --   TROPONINIHS 226* 1,286* 3,927*    Estimated Creatinine Clearance: 7.6 mL/min (A) (by C-G formula based on SCr of 8.75 mg/dL (H)).   Medical History: Past Medical History:  Diagnosis Date   Anemia    CHF (congestive heart failure) (H[facility])    COPD (chronic obstructive pulmonary disease) ([place])    Dialysis patient ([place])    Full dentures    Hypertension    Pneumonia    Pyelonephritis 09/11/2017   Renal disorder    Stroke [facility])    " A few mini strokes, I didn't know it "    Assessment: 71yo male presents w/ respiratory distress, initial troponin elevated and rising (226>1286>3927) >> to begin heparin.  Goal of Therapy:  Heparin level 0.3-0.7 units/ml Monitor platelets by anticoagulation protocol: Yes   Plan:  Heparin 2000 units IV bolus x1 followed by infusion at 1100 units/hr. Monitor heparin levels and CBC.  Wynona Neat, PharmD, BCPS  10/18/2022,1:40 AM

## 2022-10-18 NOTE — Assessment & Plan Note (Signed)
-   Initially cardiology thinking this was demand ischemia given hypoxia - Initial troponin was 226, now troponin is 3900+ - Cardiology agrees with starting heparin now but advises to also give full dose aspirin - Monitor on telemetry - Trend troponin again with a.m. labs - Continue to monitor

## 2022-10-18 NOTE — Assessment & Plan Note (Addendum)
-   O2 sats down to 80s per EMS - Maintaining oxygen sats on 50% FiO2 with BiPAP - Secondary to NSTEMI? - Initial thought was secondary to flash pulmonary edema - D-dimer with a.m. labs - EKG is without significant ST changes - Chest x-ray shows interstitial edema - Continue to monitor

## 2022-10-18 NOTE — Assessment & Plan Note (Addendum)
-   Troponin as high as 3927 - Full dose aspirin given - Started on heparin drip - Cardiology recommends keeping patient here at [place] echo was earlier this month - Defer to nephrology for volume control - Consult cardiology here at [place] to monitor

## 2022-10-18 NOTE — H&P (View-Only) (Signed)
Cardiology Consultation   Patient ID: Rithvik Orcutt MRN: [id number]; DOB: 02-02-1951  Admit date: 10/17/2022 Date of Consult: 10/18/2022  PCP:  [name], [place] Providers Cardiologist:  Freada Bergeron, MD        Patient Profile:   Cheryl Stabenow is a 71 y.o. male with a hx of PAD (s/p right femoral endarterectomy and fem-pop bypass in 09/2021), aortic stenosis, HTN, COPD, ESRD and tobacco use who is being seen 10/18/2022 for the evaluation of NSTEMI at the request of Dr. Clearence Ped.  History of Present Illness:   Mr. Ramthun was examined by myself in 12/2021 following a recent hospitalization for acute hypoxic respiratory failure in the setting of influenza A and a COPD exacerbation. Cardiology was consulted during admission given troponin values of 546 but echocardiogram showed preserved EF with no wall motion abnormalities. He did have moderate aortic stenosis. Options for ischemic evaluation were reviewed at the time of his visit but he declined further testing. He had a follow-up visit scheduled with Dr. Johney Frame in 06/2022 but canceled the visit.   He was admitted to [facility] from 11/4 - 10/14/2022 for acute hypoxic respiratory failure in the setting of a CHF exacerbation and COPD exacerbation. Volume was managed with HD and repeat echocardiogram showed that his EF was preserved at 70 to 75% with no regional wall motion abnormalities. RV function was normal but PASP was moderately elevated at 57.3 mmHg. He did have mild MR, moderate MS and moderate to severe aortic valve stenosis. He did have a hospital follow-up visit with Dr. Johney Frame on 10/16/2022 but no showed for the visit.  He presented to [facility] ED on 10/17/2022 for evaluation of worsening dyspnea. He reports having worsening dyspnea on the day of his prior hospital admission but says he developed recurrent dyspnea yesterday which prompted him to seek evaluation. Says he was trying to change  the oil in his car and could not catch his breath. He denies any associated chest pain or palpitations. No recent orthopnea, PND or pitting edema. He does report frequent wheezing and also reports coughing up a white frothy material over the past few days. He does report cramping along his lower extremities and feels like too much fluid has been removed during his dialysis sessions.  Initial labs showed WBC 13.0, Hgb 11.7, platelets 377, Na+ 133, K+ 5.1 and creatinine 8.75.  Albumin 3.4. AST 40 and ALT 19. BNP elevated to 1821. Procalcitonin at 0.27.  Venous blood gas showed pH 7.31 with O2 less than 31 and CO2 of 59. Initial Hs Troponin was at 226 with repeat values at 1286, 3927 and 6053. CXR showed interstitial edema. EKG shows sinus tachycardia, HR 125 with PVC's and slight ST depression along lateral leads.   He was placed on BiPAP in the ED and started on breathing treatments. Received 325mg  ASA and was started on Heparin while being continued on Toprol-XL 25mg  daily.    Past Medical History:  Diagnosis Date   Anemia    CHF (congestive heart failure) (HCC)    COPD (chronic obstructive pulmonary disease) ([place])    Dialysis patient [facility])    Full dentures    Hypertension    Pneumonia    Pyelonephritis 09/11/2017   Renal disorder    Stroke [facility])    " A few mini strokes, I didn't know it "    Past Surgical History:  Procedure Laterality Date   ABDOMINAL AORTOGRAM W/LOWER EXTREMITY Bilateral 09/20/2021  Procedure: ABDOMINAL AORTOGRAM W/LOWER EXTREMITY;  Surgeon: Broadus John, MD;  Location: [place] [facility];  Service: Cardiovascular;  Laterality: Bilateral;   AMPUTATION TOE Right 09/22/2021   Procedure: PARTIAL AMPUTATION SECOND RIGHT TOE;  Surgeon: Marty Heck, MD;  Location: [place];  Service: Vascular;  Laterality: Right;   [place] Right 04/19/2020   Procedure: Basilic Vein Transposition Right Arm;  Surgeon: Waynetta Sandy, MD;  Location: [place];  Service: Vascular;  Laterality: Right;   [place] Right 02/13/2022   Procedure: RIGHT ARM SECOND STAGE BASILIC VEIN TRANSPOSITION;  Surgeon: Rosetta Posner, MD;  Location: [facility];  Service: Vascular;  Laterality: Right;   COLON SURGERY     ENDARTERECTOMY FEMORAL Right 09/22/2021   Procedure: ENDARTERECTOMY FEMORAL WITH PATCH ANGIOPLASTY USING 1X6CM Weyman Pedro;  Surgeon: Marty Heck, MD;  Location: [place];  Service: Vascular;  Laterality: Right;   FEMORAL-TIBIAL BYPASS GRAFT Right 09/22/2021   Procedure: RIGHT FEMORAL TO BELOW KNEE POPLITEAL BYPASS WITH HARVEST OF GREATER SAPHENOUS VEIN;  Surgeon: Marty Heck, MD;  Location: [place];  Service: Vascular;  Laterality: Right;   INSERTION OF DIALYSIS CATHETER Right 04/19/2020   Procedure: Insertion Of Right Internal Jugular Dialysis Catheter;  Surgeon: Waynetta Sandy, MD;  Location: [facility] OR;  Service: Vascular;  Laterality: Right;   MULTIPLE TOOTH EXTRACTIONS     PERIPHERAL VASCULAR BALLOON ANGIOPLASTY Right 09/20/2021   Procedure: PERIPHERAL VASCULAR BALLOON ANGIOPLASTY;  Surgeon: Broadus John, MD;  Location: [place] [facility];  Service: Cardiovascular;  Laterality: Right;  SFA  FAILED   REMOVAL OF A DIALYSIS CATHETER N/A 05/01/2022   Procedure: MINOR REMOVAL OF A TUNNELED DIALYSIS CATHETER;  Surgeon: Rosetta Posner, MD;  Location: [facility];  Service: Vascular;  Laterality: N/A;     Home Medications:  Prior to Admission medications   Medication Sig Start Date End Date Taking? Authorizing Provider  albuterol (VENTOLIN HFA) 108 (90 Base) MCG/ACT inhaler Inhale 2 puffs into the lungs every 4 (four) hours as needed for wheezing or shortness of breath. 10/14/22  Yes Shah, Pratik D, DO  amLODipine (NORVASC) 5 MG tablet Take 2.5 mg by mouth at bedtime. 09/01/22  Yes [provider]  aspirin EC 325 MG tablet Take 325 mg by mouth. Once every 2 days 02/27/22  Yes [provider]   Cholecalciferol 125 MCG (5000 UT) TABS Take by mouth. 02/27/22  Yes [provider]  fluticasone furoate-vilanterol (BREO ELLIPTA) 200-25 MCG/INH AEPB Inhale 1 puff into the lungs daily. 10/14/22  Yes Shah, Pratik D, DO  metoprolol succinate (TOPROL XL) 25 MG 24 hr tablet Take 1 tablet (25 mg total) by mouth daily. 12/20/21  Yes Anvika Gashi, [place], PA-C  Multiple Vitamin (MULTIVITAMIN) tablet Take 1 tablet by mouth daily.   Yes [provider]  predniSONE (DELTASONE) 10 MG tablet Take 4 tablets (40 mg total) by mouth daily for 5 days. 10/14/22 10/19/22 Yes Shah, Pratik D, DO  tamsulosin (FLOMAX) 0.4 MG CAPS capsule Take 0.4 mg by mouth at bedtime. 05/24/20  Yes [provider]  tiotropium (SPIRIVA HANDIHALER) 18 MCG inhalation capsule Place 1 capsule (18 mcg total) into inhaler and inhale daily. 10/14/22  Yes Shah, Pratik D, DO  oxyCODONE-acetaminophen (PERCOCET) 5-325 MG tablet Take 1 tablet by mouth every 6 (six) hours as needed for severe pain. Patient not taking: Reported on 09/12/2022 02/13/22   EarlyArvilla Meres, MD    Inpatient Medications: Scheduled Meds:  amLODipine  2.5 mg Oral QHS   aspirin EC  81 mg Oral Daily   Chlorhexidine Gluconate Cloth  6 each Topical Q0600   fluticasone furoate-vilanterol  1 puff Inhalation Daily   methylPREDNISolone sodium succinate  125 mg Intravenous Once   metoprolol succinate  25 mg Oral Daily   rosuvastatin  40 mg Oral Daily   tamsulosin  0.4 mg Oral QHS   umeclidinium bromide  1 puff Inhalation Daily   Continuous Infusions:  heparin 1,100 Units/hr (10/18/22 0839)   PRN Meds: acetaminophen **OR** acetaminophen, albuterol, heparin, lidocaine (PF), lidocaine-prilocaine, morphine injection, ondansetron **OR** ondansetron (ZOFRAN) IV, oxyCODONE, pentafluoroprop-tetrafluoroeth  Allergies:    Allergies  Allergen Reactions   Strawberry (Diagnostic) Rash    Social History:   Social History   Socioeconomic History   Marital  status: Single    Spouse name: Not on file   Number of children: Not on file   Years of education: Not on file   Highest education level: Not on file  Occupational History   Occupation: retired  Tobacco Use   Smoking status: Every Day    Packs/day: 0.50    Years: 62.00    Total pack years: 31.00    Types: Cigarettes   Smokeless tobacco: Former    Types: Chew   Tobacco comments:    Started smoking at age 59 - rolls his own tobacco cigarettes   Vaping Use   Vaping Use: Never used  Substance and Sexual Activity   Alcohol use: No   Drug use: Yes    Types: Marijuana    Comment: once every 3-4 weeks   Sexual activity: Not Currently    Birth control/protection: None  Other Topics Concern   Not on file  Social History Narrative   Lives alone. No family nearby. He does get out and socialize with friends.    Doesn't have  a good relationship with his children   Social Determinants of Health   Financial Resource Strain: Medium Risk (11/22/2021)   Overall Financial Resource Strain (CARDIA)    Difficulty of Paying Living Expenses: Somewhat hard  Food Insecurity: No Food Insecurity (10/13/2022)   Hunger Vital Sign    Worried About Running Out of Food in the Last Year: Never true    Ran Out of Food in the Last Year: Never true  Transportation Needs: No Transportation Needs (10/13/2022)   PRAPARE - Hydrologist (Medical): No    Lack of Transportation (Non-Medical): No  Physical Activity: Sufficiently Active (11/22/2021)   Exercise Vital Sign    Days of Exercise per Week: 5 days    Minutes of Exercise per Session: 30 min  Stress: No Stress Concern Present (11/22/2021)   [place]    Feeling of Stress : Not at all  Social Connections: Socially Isolated (11/22/2021)   Social Connection and Isolation Panel [NHANES]    Frequency of Communication with Friends and Family: More than three times  a week    Frequency of Social Gatherings with Friends and Family: More than three times a week    Attends Religious Services: Never    Marine scientist or Organizations: No    Attends Archivist Meetings: Never    Marital Status: Never married  Intimate Partner Violence: Not At Risk (10/13/2022)   Humiliation, Afraid, Rape, and Kick questionnaire    Fear of Current or Ex-Partner: No    Emotionally Abused: No  Physically Abused: No    Sexually Abused: No    Family History:    Family History  Problem Relation Age of Onset   Alzheimer's disease Mother      ROS:  Please see the history of present illness.   All other ROS reviewed and negative.     Physical Exam/Data:   Vitals:   10/18/22 0715 10/18/22 0730 10/18/22 0800 10/18/22 0830  BP: 123/65 138/72 125/79 134/72  Pulse: 77 73 71 82  Resp: 20 (!) 21 19 (!) 24  Temp:      SpO2: 100% 100% 100% 100%  Weight:      Height:       No intake or output data in the 24 hours ending 10/18/22 0914    10/17/2022    6:59 PM 10/14/2022    4:36 AM 10/13/2022    6:23 PM  Last 3 Weights  Weight (lbs) 188 lb 7.9 oz 189 lb 2.5 oz 179 lb 14.3 oz  Weight (kg) 85.5 kg 85.8 kg 81.6 kg     Body mass index is 31.37 kg/m.  General:  Pleasant elderly male appearing in no acute distress. HEENT: normal Neck: no JVD Vascular: No carotid bruits; Distal pulses 2+ bilaterally Cardiac:  normal S1, S2; RRR; 2/6 SEM along RUSB.  Lungs: Expiratory wheezing along upper lung fields bilaterally.  Abd: soft, nontender, no hepatomegaly  Ext: no pitting edema Musculoskeletal:  No deformities, BUE and BLE strength normal and equal Skin: warm and dry  Neuro:  CNs 2-12 intact, no focal abnormalities noted Psych:  Normal affect   EKG:  The EKG was personally reviewed and demonstrates:  Sinus tachycardia, HR 125 with PVC's and slight ST depression along lateral leads.   Relevant CV Studies:  Echocardiogram: 10/13/2022 IMPRESSIONS      1. Left ventricular ejection fraction, by estimation, is 70 to 75%. The  left ventricle has hyperdynamic function. The left ventricle has no  regional wall motion abnormalities. There is mild left ventricular  hypertrophy. Left ventricular diastolic  parameters are indeterminate.   2. Right ventricular systolic function is normal. The right ventricular  size is normal. There is moderately elevated pulmonary artery systolic  pressure. The estimated right ventricular systolic pressure is 90.2 mmHg.   3. Left atrial size was moderately dilated.   4. Right atrial size was mildly dilated.   5. The mitral valve is degenerative. Mild mitral valve regurgitation.  Moderate mitral stenosis. The mean mitral valve gradient is 9.5 mmHg with  average heart rate of 82 bpm. Severe mitral annular calcification. DVI  0.52, in setting of hyperdynamic  ventricle.   6. The aortic valve is abnormal. There is moderate calcification of the  aortic valve. There is moderate thickening of the aortic valve. Aortic  valve regurgitation is not visualized. Moderate to severe aortic valve  stenosis. Aortic valve mean gradient  measures 35.5 mmHg. Aortic valve Vmax measures 3.84 m/s.   7. The inferior vena cava is normal in size with <50% respiratory  variability, suggesting right atrial pressure of 8 mmHg.    Laboratory Data:  High Sensitivity Troponin:   Recent Labs  Lab 10/17/22 1912 10/17/22 2104 10/17/22 2344 10/18/22 0318  TROPONINIHS 226* 1,286* 3,927* 6,053*     Chemistry Recent Labs  Lab 10/14/22 0355 10/17/22 1912 10/18/22 0318  NA 136 133* 130*  K 4.1 5.1 4.7  CL 96* 91* 92*  CO2 27 25 24   GLUCOSE 110* 230* 173*  BUN 29* 62* 68*  CREATININE 6.55* 8.75* 9.06*  CALCIUM 8.6* 8.3* 8.2*  MG 1.8  --  2.0  GFRNONAA 8* 6* 6*  ANIONGAP 13 17* 14    Recent Labs  Lab 10/17/22 1912 10/18/22 0318  PROT 7.9 7.0  ALBUMIN 3.4* 3.0*  AST 40 47*  ALT 19 16  ALKPHOS 59 51  BILITOT 0.6 0.4    Lipids No results for input(s): "CHOL", "TRIG", "HDL", "LABVLDL", "LDLCALC", "CHOLHDL" in the last 168 hours.  Hematology Recent Labs  Lab 10/14/22 0355 10/17/22 1912 10/18/22 0318  WBC 7.4 13.0* 7.3  RBC 2.83* 3.51* 2.89*  HGB 9.5* 11.7* 9.6*  HCT 28.1* 34.6* 28.6*  MCV 99.3 98.6 99.0  MCH 33.6 33.3 33.2  MCHC 33.8 33.8 33.6  RDW 14.1 14.7 14.7  PLT 236 377 186   Thyroid No results for input(s): "TSH", "FREET4" in the last 168 hours.  BNP Recent Labs  Lab 10/13/22 0805 10/17/22 1912  BNP 2,290.0* 1,821.0*    DDimer  Recent Labs  Lab 10/18/22 0318  DDIMER 1.12*     Radiology/Studies:  DG Chest Portable 1 View  Result Date: 10/17/2022 CLINICAL DATA:  Shortness of breath. EXAM: PORTABLE CHEST 1 VIEW COMPARISON:  Chest radiograph dated 10/13/2022. FINDINGS: Diffuse interstitial prominence and Kerley B-lines consistent with edema. Mild blunting of the costophrenic angles, new or progressed since the prior radiograph and may represent small pleural effusions. No consolidative changes. No pneumothorax. The cardiac silhouette is within normal limits. Atherosclerotic calcification of the aorta. No acute osseous pathology. IMPRESSION: Relatively similar appearance of interstitial edema. Electronically Signed   By: Anner Crete M.D.   On: 10/17/2022 19:55     Assessment and Plan:   1. NSTEMI - Presented with acutely worsening dyspnea starting yesterday but denies any associated chest pain. Found to have an NSTEMI with most recent Hs Troponin up to 6053. EKG shows slight ST depression along the lateral leads. Will obtain a limited echocardiogram for reassessment of his EF and wall motion. - Findings were reviewed with the patient and Dr. Dellia Cloud did review risks and benefits of a cardiac catheterization with him. The patient understands that risks include but are not limited to stroke (1 in 1000), death (1 in 7), kidney failure [usually temporary] (1 in 500), bleeding (1  in 200), allergic reaction [possibly serious] (1 in 200). While he is currently DNR, he does agree to rescind this during his procedure. - Continue IV Heparin and ASA but will update the order to 81 mg daily instead of ASA 325 mg every 48 hours as ordered on admission. He has been continued on PTA Toprol-XL 25 mg daily and will add Crestor 40 mg daily. Discussed with the Hospitalist and Nephrology and anticipate cardiac catheterization later today following his morning dialysis session.  2. PAD - He is s/p right femoral endarterectomy and fem-pop bypass in 09/2021. Followed by Vascular Surgery and he has also been more recently followed by them for steal symptoms along his right hand. -Continue ASA 81 mg daily and will restart statin therapy as outlined above.  3. HLD - He was previously on Atorvastatin 40 mg daily but this is no longer listed on his medication list. We will restart high-intensity statin therapy and recheck an FLP.  4. Aortic Stenosis -This was moderate to severe by most recent echocardiogram earlier this month. Pending work-up this admission, would consider Structural Heart Team evaluation.   5. ESRD - Nephrology following and planning for HD today.   6. Acute Hypoxic  Respiratory Failure/COPD - Initially required BiPAP but weaned to nasal cannula this morning. Continue with inhalers and scheduled nebulizers. Management per the admitting team.     Risk Assessment/Risk Scores:     TIMI Risk Score for Unstable Angina or Non-ST Elevation MI:   The patient's TIMI risk score is 4, which indicates a 20% risk of all cause mortality, new or recurrent myocardial infarction or need for urgent revascularization in the next 14 days.  For questions or updates, please contact [facility] Please consult [email or web address] for contact info under    Signed, Erma Heritage, PA-C  10/18/2022 9:14 AM

## 2022-10-18 NOTE — ED Notes (Signed)
Patient removed from Bipap at 0900 this am and left on RA. Then transported to Dialysis. RN from ED called and wanted a neb treatment. Arrived in Dialysis to administer neb treatment and the RN from Dialysis stated he was going to [facility] for heart catheterization. Bipap is still in the room with him and on standby.

## 2022-10-18 NOTE — Progress Notes (Signed)
Received patient from [facility] via [place]. Alert and oriented, skin warm and dry, respirations slightly labored. Patient placed on beside monitor, IV fluids started at [facility]. Denies chest pain. Dr [place] in to see patient. Patient waiting for Cath procedure.  Kathleene Hazel RN

## 2022-10-18 NOTE — H&P (Signed)
History and Physical    Patient: Kerry Underwood CNO:[id number] DOB: 1951/01/12 DOA: 10/17/2022 DOS: the patient was seen and examined on 10/18/2022 PCP: [name], FNP  Patient coming from: Home  Chief Complaint:  Chief Complaint  Patient presents with   Respiratory Distress   HPI: Kerry Underwood is a 71 y.o. male with medical history significant of anemia, CHF, COPD, hypertension, stroke, and more presents to the ED with a chief complaint of dyspnea.  Of note patient was just discharged approximately 4 days ago after an admission for acute hypoxemic respiratory failure.  He was admitted for multifactorial acute hypoxemic respiratory failure with mild pulmonary edema and suspected acute on chronic diastolic CHF.  He is also thought to have COPD exacerbation contributing and responded well to breathing treatments and steroids.  Echo was done that showed ejection fraction 70-75%.  Patient reports today's episode was very similar to the episode he had prior to his last admission.  He went to walk outside to work on his car, he felt like he lost his breath walking outside, and could not catch it again.  He started coughing up white frothy sputum.  He had dry heaves.  Patient reports nausea.  He denies palpitations but admits to diaphoresis that was so bad it drenched his shirt.  He reports it feels the same as when your blood pressure drops.  He felt weak and dizzy.  Bending over helped.  Patient does not wear oxygen at home.  Patient reports this same thing happened before routine dialysis last Saturday.  Patient is used to feeling nauseous and weak after dialysis, but became very scared when he felt short of breath and it was unprotected.  Patient reports he still short of breath but the BiPAP is helped immensely.  Patient has no history of a heart attack per his report.  Patient reports that he does not eat real salty foods, did not have anything particularly salty prior to this episode.  He has not  had any fluid retention recently, and feels like he has been at his normal state of health. Patient is a current smoker approximately half a pack per day.  He does not drink, he is not vaccinated for COVID.  Patient is DNR. Review of Systems: As mentioned in the history of present illness. All other systems reviewed and are negative. Past Medical History:  Diagnosis Date   Anemia    CHF (congestive heart failure) (HCC)    COPD (chronic obstructive pulmonary disease) ([place])    Dialysis patient [facility])    Full dentures    Hypertension    Pneumonia    Pyelonephritis 09/11/2017   Renal disorder    Stroke [facility])    " A few mini strokes, I didn't know it "   Past Surgical History:  Procedure Laterality Date   ABDOMINAL AORTOGRAM W/LOWER EXTREMITY Bilateral 09/20/2021   Procedure: ABDOMINAL AORTOGRAM W/LOWER EXTREMITY;  Surgeon: Broadus John, MD;  Location: [place] [facility];  Service: Cardiovascular;  Laterality: Bilateral;   AMPUTATION TOE Right 09/22/2021   Procedure: PARTIAL AMPUTATION SECOND RIGHT TOE;  Surgeon: Marty Heck, MD;  Location: [place];  Service: Vascular;  Laterality: Right;   [place] Right 04/19/2020   Procedure: Basilic Vein Transposition Right Arm;  Surgeon: Waynetta Sandy, MD;  Location: [place];  Service: Vascular;  Laterality: Right;   [place] Right 02/13/2022   Procedure: RIGHT ARM SECOND STAGE BASILIC VEIN TRANSPOSITION;  Surgeon: Curt Jews  F, MD;  Location: [facility];  Service: Vascular;  Laterality: Right;   COLON SURGERY     ENDARTERECTOMY FEMORAL Right 09/22/2021   Procedure: ENDARTERECTOMY FEMORAL WITH PATCH ANGIOPLASTY USING 1X6CM Weyman Pedro;  Surgeon: Marty Heck, MD;  Location: [place];  Service: Vascular;  Laterality: Right;   FEMORAL-TIBIAL BYPASS GRAFT Right 09/22/2021   Procedure: RIGHT FEMORAL TO BELOW KNEE POPLITEAL BYPASS WITH HARVEST OF GREATER SAPHENOUS VEIN;  Surgeon: Marty Heck, MD;  Location: [place];  Service: Vascular;  Laterality: Right;   INSERTION OF DIALYSIS CATHETER Right 04/19/2020   Procedure: Insertion Of Right Internal Jugular Dialysis Catheter;  Surgeon: Waynetta Sandy, MD;  Location: [facility];  Service: Vascular;  Laterality: Right;   MULTIPLE TOOTH EXTRACTIONS     PERIPHERAL VASCULAR BALLOON ANGIOPLASTY Right 09/20/2021   Procedure: PERIPHERAL VASCULAR BALLOON ANGIOPLASTY;  Surgeon: Broadus John, MD;  Location: [place] [facility];  Service: Cardiovascular;  Laterality: Right;  SFA  FAILED   REMOVAL OF A DIALYSIS CATHETER N/A 05/01/2022   Procedure: MINOR REMOVAL OF A TUNNELED DIALYSIS CATHETER;  Surgeon: Rosetta Posner, MD;  Location: [facility];  Service: Vascular;  Laterality: N/A;   Social History:  reports that he has been smoking cigarettes. He has a 31.00 pack-year smoking history. He has quit using smokeless tobacco.  His smokeless tobacco use included chew. He reports current drug use. Drug: Marijuana. He reports that he does not drink alcohol.  Allergies  Allergen Reactions   Strawberry (Diagnostic) Rash    Family History  Problem Relation Age of Onset   Alzheimer's disease Mother     Prior to Admission medications   Medication Sig Start Date End Date Taking? Authorizing Provider  albuterol (VENTOLIN HFA) 108 (90 Base) MCG/ACT inhaler Inhale 2 puffs into the lungs every 4 (four) hours as needed for wheezing or shortness of breath. 10/14/22  Yes Shah, Pratik D, DO  amLODipine (NORVASC) 5 MG tablet Take 2.5 mg by mouth at bedtime. 09/01/22  Yes [provider]  aspirin EC 325 MG tablet Take 325 mg by mouth. Once every 2 days 02/27/22  Yes [provider]  Cholecalciferol 125 MCG (5000 UT) TABS Take by mouth. 02/27/22  Yes [provider]  fluticasone furoate-vilanterol (BREO ELLIPTA) 200-25 MCG/INH AEPB Inhale 1 puff into the lungs daily. 10/14/22  Yes Shah, Pratik D, DO  metoprolol succinate (TOPROL  XL) 25 MG 24 hr tablet Take 1 tablet (25 mg total) by mouth daily. 12/20/21  Yes Strader, Putnam, PA-C  Multiple Vitamin (MULTIVITAMIN) tablet Take 1 tablet by mouth daily.   Yes [provider]  predniSONE (DELTASONE) 10 MG tablet Take 4 tablets (40 mg total) by mouth daily for 5 days. 10/14/22 10/19/22 Yes Shah, Pratik D, DO  tamsulosin (FLOMAX) 0.4 MG CAPS capsule Take 0.4 mg by mouth at bedtime. 05/24/20  Yes [provider]  tiotropium (SPIRIVA HANDIHALER) 18 MCG inhalation capsule Place 1 capsule (18 mcg total) into inhaler and inhale daily. 10/14/22  Yes Shah, Pratik D, DO  oxyCODONE-acetaminophen (PERCOCET) 5-325 MG tablet Take 1 tablet by mouth every 6 (six) hours as needed for severe pain. Patient not taking: Reported on 09/12/2022 02/13/22   Rosetta Posner, MD    Physical Exam: Vitals:   10/17/22 2345 10/18/22 0036 10/18/22 0300 10/18/22 0420  BP: 123/67 126/64 126/68 135/68  Pulse: 78 80 77 78  Resp: 13 (!) 26 (!) 28 (!) 24  Temp:  SpO2: 100% 100% 100% 100%  Weight:      Height:       1.  General: Patient lying supine in bed,  no acute distress   2. Psychiatric: Alert and oriented x 3, mood and behavior normal for situation, pleasant and cooperative with exam   3. Neurologic: Speech and language are normal, face is symmetric, moves all 4 extremities voluntarily, at baseline without acute deficits on limited exam   4. HEENMT:  Head is atraumatic, normocephalic, pupils reactive to light, neck is supple, trachea is midline, mucous membranes are moist   5. Respiratory : Lungs are clear to auscultation bilaterally without wheezing, rhonchi, rales, no cyanosis, no increase in work of breathing or accessory muscle use   6. Cardiovascular : Heart rate normal, rhythm is regular, murmur present, rubs or gallops, no peripheral edema, peripheral pulses palpated   7. Gastrointestinal:  Abdomen is soft, nondistended, nontender to palpation bowel sounds active,  no masses or organomegaly palpated   8. Skin:  Skin is warm, dry and intact without rashes, acute lesions, or ulcers on limited exam   9.Musculoskeletal:  No acute deformities or trauma, no asymmetry in tone, no peripheral edema, peripheral pulses palpated, no tenderness to palpation in the extremities  Data Reviewed: In the ED Temp 97.9, heart rate 92-119, respiratory rate 16-34, blood pressure 117/63-147/96, satting at 99-100% on 50% FiO2 BiPAP Leukocytosis at 13, hemoglobin stable 11.7, platelets 377 Potassium mildly elevated at 5.1 BUN 62, creatinine 8.75, glucose 230>> secondary to steroids?  If it continues to be high add sliding scale Chest x-ray shows interstitial edema EKG shows a heart rate of 125, sinus tach, QTc 437 without acute ST segment changes Cardiology recommended keeping patient here and said this was likely due to demand ischemia Nephrology consulted and plans to dialyze patient tomorrow Admission requested for acute respiratory failure with hypoxia Assessment and Plan: * Acute respiratory failure with hypoxia ([place]) - O2 sats down to 80s per EMS - Maintaining oxygen sats on 50% FiO2 with BiPAP - Secondary to NSTEMI? - Initial thought was secondary to flash pulmonary edema - D-dimer with a.m. labs - EKG is without significant ST changes - Chest x-ray shows interstitial edema - Continue to monitor  Demand ischemia - Initially cardiology thinking this was demand ischemia given hypoxia - Initial troponin was 226, now troponin is 3900+ - Cardiology agrees with starting heparin now but advises to also give full dose aspirin - Monitor on telemetry - Trend troponin again with a.m. labs - Continue to monitor  NSTEMI (non-ST elevated myocardial infarction) (HCC) - Troponin as high as 3927 - Full dose aspirin given - Started on heparin drip - Cardiology recommends keeping patient here at [place] echo was earlier this month - Defer to nephrology for  volume control - Consult cardiology here at [place] to monitor  Chronic diastolic heart failure ([place]) - Last echo showed ejection fraction 70-75% with indeterminate diastolic dysfunction - Continue aspirin, metoprolol - Consult cardiology due to NSTEMI - Continue to monitor  COPD (chronic obstructive pulmonary disease) ([place]) - Initially there was some concern for COPD exacerbation - I think this is more cardiac related at this point - PCO2 59, pH 7.31 on venous blood gas - Patient received DuoNeb and Solu-Medrol prior to admission - Continue Breo - Continue albuterol as needed - Continue BiPAP - Continue to monitor  ESRD on hemodialysis ([place]) - Nephro consulted from the ED - Plan for dialysis  in the a.m.  Hypertension secondary to other renal disorders - Continue Norvasc and metoprolol      Advance Care Planning:   Code Status: DNR   Consults: Cardiology and nephrology  Family Communication: No family at bedside  Severity of Illness: The appropriate patient status for this patient is INPATIENT. Inpatient status is judged to be reasonable and necessary in order to provide the required intensity of service to ensure the patient's safety. The patient's presenting symptoms, physical exam findings, and initial radiographic and laboratory data in the context of their chronic comorbidities is felt to place them at high risk for further clinical deterioration. Furthermore, it is not anticipated that the patient will be medically stable for discharge from the hospital within 2 midnights of admission.   * I certify that at the point of admission it is my clinical judgment that the patient will require inpatient hospital care spanning beyond 2 midnights from the point of admission due to high intensity of service, high risk for further deterioration and high frequency of surveillance required.*  Author: Rolla Plate, DO 10/18/2022 4:34 AM  For on call review  [email or web address].

## 2022-10-18 NOTE — ED Notes (Signed)
Date and time results received: 10/18/22 1235   Test: Troponin Critical Value: 3927  Name of Provider Notified: Dr Earnest Conroy  Orders Received? Or Actions Taken?: NA

## 2022-10-18 NOTE — ED Notes (Signed)
BIPAP removed by RT

## 2022-10-18 NOTE — Consult Note (Signed)
Reason for Consult: To manage dialysis and dialysis related needs Referring Physician: [name] is an 71 y.o. male with past medical history significant for COPD, HTN, ESRD-  TTS who actually has been pretty stable without a lot of hospital admissions-   had brief hosp on 11/4 to 11/5 when last HD was 10/31-  had dialysis at [facility]-  removed 2.5 liters with improvement.  Then pt says since then has had DOE above and beyond his usual.  Kerry Underwood to HD on Tuesday but only stayed 2.5 hours-  left out 0.9 above EDW and presented to the ER last night with SOB/DOE.  CXR c/w pulm edema but also has a troponin of over 6000-  cards has seen and would like to transfer pt to [facility] this afternoon for cardiac cath-  we are to do dialysis here first.  Pt currently c/o DOE-  had to use the urinal and this kicked off worsening SOB-  no CP.  Says that he had been feeling really poorly after HD of late-  does not drop his BP though it seems    Dialyzes at [facility] TTS 4 hours and 15 minutes  EDW 79.5-  profile #4 HD [place] 2/2.5, Dialyzer 180, Heparin yes- 5000 units. Access right upper AVF-  400 BFR.  Calcitriol 0.25 TIW, mircera 30-    last hgb 10.2, last K 4.9-  only ran 2.5 hours on Tuesday -  leaving weight was 80.6  Past Medical History:  Diagnosis Date   Anemia    CHF (congestive heart failure) (HCC)    COPD (chronic obstructive pulmonary disease) ([place])    Dialysis patient ([place])    Full dentures    Hypertension    Pneumonia    Pyelonephritis 09/11/2017   Renal disorder    Stroke [facility])    " A few mini strokes, I didn't know it "    Past Surgical History:  Procedure Laterality Date   ABDOMINAL AORTOGRAM W/LOWER EXTREMITY Bilateral 09/20/2021   Procedure: ABDOMINAL AORTOGRAM W/LOWER EXTREMITY;  Surgeon: Broadus John, MD;  Location: [place] [facility];  Service: Cardiovascular;  Laterality: Bilateral;   AMPUTATION TOE Right 09/22/2021   Procedure: PARTIAL AMPUTATION SECOND RIGHT TOE;   Surgeon: Marty Heck, MD;  Location: [place];  Service: Vascular;  Laterality: Right;   [place] Right 04/19/2020   Procedure: Basilic Vein Transposition Right Arm;  Surgeon: Waynetta Sandy, MD;  Location: [place];  Service: Vascular;  Laterality: Right;   [place] Right 02/13/2022   Procedure: RIGHT ARM SECOND STAGE BASILIC VEIN TRANSPOSITION;  Surgeon: Rosetta Posner, MD;  Location: [facility];  Service: Vascular;  Laterality: Right;   COLON SURGERY     ENDARTERECTOMY FEMORAL Right 09/22/2021   Procedure: ENDARTERECTOMY FEMORAL WITH PATCH ANGIOPLASTY USING 1X6CM Weyman Pedro;  Surgeon: Marty Heck, MD;  Location: [place];  Service: Vascular;  Laterality: Right;   FEMORAL-TIBIAL BYPASS GRAFT Right 09/22/2021   Procedure: RIGHT FEMORAL TO BELOW KNEE POPLITEAL BYPASS WITH HARVEST OF GREATER SAPHENOUS VEIN;  Surgeon: Marty Heck, MD;  Location: [place];  Service: Vascular;  Laterality: Right;   INSERTION OF DIALYSIS CATHETER Right 04/19/2020   Procedure: Insertion Of Right Internal Jugular Dialysis Catheter;  Surgeon: Waynetta Sandy, MD;  Location: [place];  Service: Vascular;  Laterality: Right;   MULTIPLE TOOTH EXTRACTIONS     PERIPHERAL VASCULAR BALLOON ANGIOPLASTY Right 09/20/2021   Procedure: PERIPHERAL VASCULAR BALLOON ANGIOPLASTY;  Surgeon:  Broadus John, MD;  Location: [place] [facility];  Service: Cardiovascular;  Laterality: Right;  SFA  FAILED   REMOVAL OF A DIALYSIS CATHETER N/A 05/01/2022   Procedure: MINOR REMOVAL OF A TUNNELED DIALYSIS CATHETER;  Surgeon: Rosetta Posner, MD;  Location: [facility];  Service: Vascular;  Laterality: N/A;    Family History  Problem Relation Age of Onset   Alzheimer's disease Mother     Social History:  reports that he has been smoking cigarettes. He has a 31.00 pack-year smoking history. He has quit using smokeless tobacco.  His smokeless tobacco use included chew. He reports  current drug use. Drug: Marijuana. He reports that he does not drink alcohol.  Allergies:  Allergies  Allergen Reactions   Strawberry (Diagnostic) Rash    Medications: I have reviewed the patient's current medications.  Results for orders placed or performed during the hospital encounter of 10/17/22 (from the past 48 hour(s))  Comprehensive metabolic panel     Status: Abnormal   Collection Time: 10/17/22  7:12 PM  Result Value Ref Range   Sodium 133 (L) 135 - 145 mmol/L   Potassium 5.1 3.5 - 5.1 mmol/L   Chloride 91 (L) 98 - 111 mmol/L   CO2 25 22 - 32 mmol/L   Glucose, Bld 230 (H) 70 - 99 mg/dL    Comment: Glucose reference range applies only to samples taken after fasting for at least 8 hours.   BUN 62 (H) 8 - 23 mg/dL   Creatinine, Ser 8.75 (H) 0.61 - 1.24 mg/dL   Calcium 8.3 (L) 8.9 - 10.3 mg/dL   Total Protein 7.9 6.5 - 8.1 g/dL   Albumin 3.4 (L) 3.5 - 5.0 g/dL   AST 40 15 - 41 U/L   ALT 19 0 - 44 U/L   Alkaline Phosphatase 59 38 - 126 U/L   Total Bilirubin 0.6 0.3 - 1.2 mg/dL   GFR, Estimated 6 (L) >60 mL/min    Comment: (NOTE) Calculated using the CKD-EPI Creatinine Equation (2021)    Anion gap 17 (H) 5 - 15    Comment: Performed at [facility], [address], [place], [place] [address]  Troponin I (High Sensitivity)     Status: Abnormal   Collection Time: 10/17/22  7:12 PM  Result Value Ref Range   Troponin I (High Sensitivity) 226 (HH) <18 ng/L    Comment: CRITICAL RESULT CALLED TO, READ BACK BY AND VERIFIED WITH: Celedonio Savage RN AT 20:09 ON 10/17/22 R.BYRD (NOTE) Elevated high sensitivity troponin I (hsTnI) values and significant  changes across serial measurements may suggest ACS but many other  chronic and acute conditions are known to elevate hsTnI results.  Refer to the Links section for chest pain algorithms and additional  guidance. Performed at [facility], [address], [place],  [address]   CBC with Differential     Status: Abnormal    Collection Time: 10/17/22  7:12 PM  Result Value Ref Range   WBC 13.0 (H) 4.0 - 10.5 K/uL   RBC 3.51 (L) 4.22 - 5.81 MIL/uL   Hemoglobin 11.7 (L) 13.0 - 17.0 g/dL   HCT 34.6 (L) 39.0 - 52.0 %   MCV 98.6 80.0 - 100.0 fL   MCH 33.3 26.0 - 34.0 pg   MCHC 33.8 30.0 - 36.0 g/dL   RDW 14.7 11.5 - 15.5 %   Platelets 377 150 - 400 K/uL   nRBC 0.0 0.0 - 0.2 %   Neutrophils Relative %  88 %   Neutro Abs 11.5 (H) 1.7 - 7.7 K/uL   Lymphocytes Relative 8 %   Lymphs Abs 1.1 0.7 - 4.0 K/uL   Monocytes Relative 3 %   Monocytes Absolute 0.4 0.1 - 1.0 K/uL   Eosinophils Relative 0 %   Eosinophils Absolute 0.0 0.0 - 0.5 K/uL   Basophils Relative 0 %   Basophils Absolute 0.0 0.0 - 0.1 K/uL   Immature Granulocytes 1 %   Abs Immature Granulocytes 0.08 (H) 0.00 - 0.07 K/uL    Comment: Performed at [facility], [address], [place], [place] [address]  Brain natriuretic peptide     Status: Abnormal   Collection Time: 10/17/22  7:12 PM  Result Value Ref Range   B Natriuretic Peptide 1,821.0 (H) 0.0 - 100.0 pg/mL    Comment: Performed at [facility], [address] [place], [place] [address]  Blood gas, venous (at [facility] and [facility], not at [facility])     Status: Abnormal   Collection Time: 10/17/22  7:41 PM  Result Value Ref Range   pH, Ven 7.31 7.25 - 7.43   pCO2, Ven 59 44 - 60 mmHg   pO2, Ven <31 (LL) 32 - 45 mmHg    Comment: CRITICAL RESULT CALLED TO, READ BACK BY AND VERIFIED WITH: THOMPSON,R ON 10/17/22 AT 1955 BY LOY,C    Bicarbonate 29.5 (H) 20.0 - 28.0 mmol/L   Acid-Base Excess 2.1 (H) 0.0 - 2.0 mmol/L   O2 Saturation 46.7 %   Patient temperature 36.6    Collection site BLOOD LEFT HAND    Drawn by [phone]     Comment: Performed at [facility], [address] [place], [place] [address]  Troponin I (High Sensitivity)     Status: Abnormal   Collection Time: 10/17/22  9:04 PM  Result Value Ref Range   Troponin I (High Sensitivity) 1,286 (HH) <18 ng/L    Comment: DELTA CHECK NOTED CRITICAL RESULT  CALLED TO, READ BACK BY AND VERIFIED WITH: R.thompson RN at 22:27  on 10/17/22  R.Byrd (NOTE) Elevated high sensitivity troponin I (hsTnI) values and significant  changes across serial measurements may suggest ACS but many other  chronic and acute conditions are known to elevate hsTnI results.  Refer to the Links section for chest pain algorithms and additional  guidance. Performed at [facility], [address] [place], [place] [address]   Procalcitonin - Baseline     Status: None   Collection Time: 10/17/22  9:04 PM  Result Value Ref Range   Procalcitonin 0.27 ng/mL    Comment:        Interpretation: PCT (Procalcitonin) <= 0.5 ng/mL: Systemic infection (sepsis) is not likely. Local bacterial infection is possible. (NOTE)       Sepsis PCT Algorithm           Lower Respiratory Tract                                      Infection PCT Algorithm    ----------------------------     ----------------------------         PCT < 0.25 ng/mL                PCT < 0.10 ng/mL          Strongly encourage             Strongly discourage   discontinuation of antibiotics  initiation of antibiotics    ----------------------------     -----------------------------       PCT 0.25 - 0.50 ng/mL            PCT 0.10 - 0.25 ng/mL               OR       >80% decrease in PCT            Discourage initiation of                                            antibiotics      Encourage discontinuation           of antibiotics    ----------------------------     -----------------------------         PCT >= 0.50 ng/mL              PCT 0.26 - 0.50 ng/mL               AND        <80% decrease in PCT             Encourage initiation of                                             antibiotics       Encourage continuation           of antibiotics    ----------------------------     -----------------------------        PCT >= 0.50 ng/mL                  PCT > 0.50 ng/mL               AND         increase in PCT                   Strongly encourage                                      initiation of antibiotics    Strongly encourage escalation           of antibiotics                                     -----------------------------                                           PCT <= 0.25 ng/mL                                                 OR                                        >  80% decrease in PCT                                      Discontinue / Do not initiate                                             antibiotics  Performed at [facility], [address] [place], [place] [address]   Troponin I (High Sensitivity)     Status: Abnormal   Collection Time: 10/17/22 11:44 PM  Result Value Ref Range   Troponin I (High Sensitivity) 3,927 (HH) <18 ng/L    Comment: DELTA CHECK NOTED CRITICAL RESULT CALLED TO, READ BACK BY AND VERIFIED WITH: THOMPSON R@0035  BY MATTHEWS, B 11.9.2023 (NOTE) Elevated high sensitivity troponin I (hsTnI) values and significant  changes across serial measurements may suggest ACS but many other  chronic and acute conditions are known to elevate hsTnI results.  Refer to the Links section for chest pain algorithms and additional  guidance. Performed at [facility], [address] [place], [place] [address]   Comprehensive metabolic panel     Status: Abnormal   Collection Time: 10/18/22  3:18 AM  Result Value Ref Range   Sodium 130 (L) 135 - 145 mmol/L   Potassium 4.7 3.5 - 5.1 mmol/L   Chloride 92 (L) 98 - 111 mmol/L   CO2 24 22 - 32 mmol/L   Glucose, Bld 173 (H) 70 - 99 mg/dL    Comment: Glucose reference range applies only to samples taken after fasting for at least 8 hours.   BUN 68 (H) 8 - 23 mg/dL   Creatinine, Ser 9.06 (H) 0.61 - 1.24 mg/dL   Calcium 8.2 (L) 8.9 - 10.3 mg/dL   Total Protein 7.0 6.5 - 8.1 g/dL   Albumin 3.0 (L) 3.5 - 5.0 g/dL   AST 47 (H) 15 - 41 U/L   ALT 16 0 - 44 U/L   Alkaline Phosphatase 51 38 - 126 U/L   Total Bilirubin 0.4 0.3 -  1.2 mg/dL   GFR, Estimated 6 (L) >60 mL/min    Comment: (NOTE) Calculated using the CKD-EPI Creatinine Equation (2021)    Anion gap 14 5 - 15    Comment: Performed at [facility], [address] [place], [place] [address]  Magnesium     Status: None   Collection Time: 10/18/22  3:18 AM  Result Value Ref Range   Magnesium 2.0 1.7 - 2.4 mg/dL    Comment: Performed at [facility], [address], [place], [place] [address]  Phosphorus     Status: Abnormal   Collection Time: 10/18/22  3:18 AM  Result Value Ref Range   Phosphorus 7.4 (H) 2.5 - 4.6 mg/dL    Comment: Performed at [facility], [address], [place],  [address]  CBC     Status: Abnormal   Collection Time: 10/18/22  3:18 AM  Result Value Ref Range   WBC 7.3 4.0 - 10.5 K/uL   RBC 2.89 (L) 4.22 - 5.81 MIL/uL   Hemoglobin 9.6 (L) 13.0 - 17.0 g/dL   HCT 28.6 (L) 39.0 - 52.0 %   MCV 99.0 80.0 - 100.0 fL   MCH 33.2 26.0 - 34.0 pg   MCHC 33.6 30.0 - 36.0 g/dL  RDW 14.7 11.5 - 15.5 %   Platelets 186 150 - 400 K/uL   nRBC 0.0 0.0 - 0.2 %    Comment: Performed at [facility], [address], [place], [place] [address]  D-dimer, quantitative     Status: Abnormal   Collection Time: 10/18/22  3:18 AM  Result Value Ref Range   D-Dimer, Quant 1.12 (H) 0.00 - 0.50 ug/mL-FEU    Comment: (NOTE) At the manufacturer cut-off value of 0.5 g/mL FEU, this assay has a negative predictive value of 95-100%.This assay is intended for use in conjunction with a clinical pretest probability (PTP) assessment model to exclude pulmonary embolism (PE) and deep venous thrombosis (DVT) in outpatients suspected of PE or DVT. Results should be correlated with clinical presentation. Performed at [facility], [address] [place], [place] [address]   Troponin I (High Sensitivity)     Status: Abnormal   Collection Time: 10/18/22  3:18 AM  Result Value Ref Range   Troponin I (High Sensitivity) 6,053 (HH) <18 ng/L    Comment: CRITICAL  RESULT CALLED TO, READ BACK BY AND VERIFIED WITH: THOMPSON,R AT 5:45AM ON 10/18/22 BY FESTERMAN,C (NOTE) Elevated high sensitivity troponin I (hsTnI) values and significant  changes across serial measurements may suggest ACS but many other  chronic and acute conditions are known to elevate hsTnI results.  Refer to the Links section for chest pain algorithms and additional  guidance. Performed at [facility], [address], [place], [place] [address]     DG Chest Portable 1 View  Result Date: 10/17/2022 CLINICAL DATA:  Shortness of breath. EXAM: PORTABLE CHEST 1 VIEW COMPARISON:  Chest radiograph dated 10/13/2022. FINDINGS: Diffuse interstitial prominence and Kerley B-lines consistent with edema. Mild blunting of the costophrenic angles, new or progressed since the prior radiograph and may represent small pleural effusions. No consolidative changes. No pneumothorax. The cardiac silhouette is within normal limits. Atherosclerotic calcification of the aorta. No acute osseous pathology. IMPRESSION: Relatively similar appearance of interstitial edema. Electronically Signed   By: Anner Crete M.D.   On: 10/17/2022 19:55    ROS: SOB Blood pressure 134/72, pulse 82, temperature 97.9 F (36.6 C), resp. rate (!) 24, height 5\' 5"  (1.651 m), weight 85.5 kg, SpO2 100 %. General appearance: alert, appears older than stated age, and moderate distress Resp: diminished breath sounds bilaterally and wheezes bilaterally Cardio: regular rate and rhythm, S1, S2 normal, no murmur, click, rub or gallop GI: soft, non-tender; bowel sounds normal; no masses,  no organomegaly Extremities: extremities normal, atraumatic, no cyanosis or edema Right upper arm AVF-  area of dried blood but no active bleeding at present-  avf is patent   Assessment/Plan: 71 year old WM-  ESRD-  presenting with SOB/DOE above his usual 1 SOB/DOE-  so has not had great HD of late-  signed off early Tuesday and missed last Thursday so  is overloaded -  but says he felt bad after HD last 2 tx so that is not consistent with only volume overload causing his sxms-  could be cardiac component-  see  below.  Plan on HD this AM with 3-4 liters removal as able 2.  Cards  very high troponin-  concern for ischemia givin these sxms-  cardiology planning for cardiac cath-  hopefully today   3 ESRD: normally TTS-  has not had consistent HD of late.  Given pulm edema-  will do HD today on schedule with max volume removal-  3 Hypertension/vol: pulmonary edema-  max UF with HD today  4. Anemia of ESRD: hgb adequate-  no meds needed at present  5. Metabolic Bone Disease: will continue his calcitriol -  also velphoro    Louis Meckel 10/18/2022, 9:16 AM

## 2022-10-18 NOTE — ED Notes (Signed)
Cardiology at bedside- states hold meds until after dialysis- pt states taking prior makes him sick

## 2022-10-18 NOTE — ED Provider Notes (Signed)
[facility] EMERGENCY DEPARTMENT Provider Note  CSN: [id number] Arrival date & time: 10/17/22 1856  Chief Complaint(s) Respiratory Distress  HPI [name] is a 71 y.o. male with PMH CHF, COPD, ESRD on hemodialysis Tuesday Thursday Saturday who presents emergency department for evaluation of shortness of breath.  Patient recently admitted to the hospital on 11/1 and discharged on 11/5 after an apparent COPD/CHF exacerbation and states he has been doing well until today when he had an episode of sudden onset of chest pain shortness of breath and diaphoresis.  He states at that time he was coughing up "lots of foamy white stuff".  Patient found to be hypoxic in the low 80s in the field and placed on CPAP.  Patient received a DuoNeb and Solu-Medrol by EMS prior to arrival.  Patient appears much improved on the CPAP and denies current chest pain, abdominal pain, nausea, vomiting, headache, fever or other systemic symptoms.   Past Medical History Past Medical History:  Diagnosis Date   Anemia    CHF (congestive heart failure) (HCC)    COPD (chronic obstructive pulmonary disease) ([place])    Dialysis patient ([place])    Full dentures    Hypertension    Pneumonia    Pyelonephritis 09/11/2017   Renal disorder    Stroke [facility])    " A few mini strokes, I didn't know it "   Patient Active Problem List   Diagnosis Date Noted   Acute hypoxemic respiratory failure ([place]) 10/13/2022   Demand ischemia 11/26/2021   NSTEMI (non-ST elevated myocardial infarction) ([place]) 11/24/2021   Peripheral vascular disease ([place]) 09/25/2021   Nausea 09/20/2021   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   Cellulitis of right foot 09/15/2021   Vitamin D deficiency 08/23/2021   Acute respiratory failure with hypoxia ([place]) 06/29/2021   COPD (chronic obstructive pulmonary disease) ([place]) 11/23/2020   Influenza A Infection 11/22/2020   COPD with acute exacerbation ([place]) 09/17/2020   ESRD on  hemodialysis ([place]) 05/11/2020   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 Authorizing Provider  albuterol (VENTOLIN HFA) 108 (90 Base) MCG/ACT inhaler Inhale 2 puffs into the lungs every 4 (four) hours as needed for wheezing or shortness of breath. 10/14/22  Yes Shah, Pratik D, DO  amLODipine (NORVASC) 5 MG tablet Take 2.5 mg by mouth at bedtime. 09/01/22  Yes [provider]  aspirin EC 325 MG tablet Take 325 mg by mouth. Once every 2 days 02/27/22  Yes [provider]  Cholecalciferol 125 MCG (5000 UT) TABS Take by mouth. 02/27/22  Yes [provider]  fluticasone furoate-vilanterol (BREO ELLIPTA) 200-25 MCG/INH AEPB Inhale 1 puff into the lungs daily. 10/14/22  Yes Shah, Pratik D, DO  metoprolol succinate (TOPROL XL) 25 MG 24 hr tablet Take 1 tablet (25 mg total) by mouth daily. 12/20/21  Yes Strader, [place], PA-C  Multiple Vitamin (MULTIVITAMIN) tablet Take 1 tablet by mouth daily.   Yes [provider]  predniSONE (DELTASONE) 10 MG tablet Take 4 tablets (40 mg total) by mouth daily for 5 days. 10/14/22 10/19/22 Yes Manuella Ghazi, Pratik D, DO  tamsulosin (FLOMAX) 0.4 MG CAPS capsule Take 0.4 mg by mouth at bedtime. 05/24/20  Yes [provider]  tiotropium (SPIRIVA HANDIHALER) 18 MCG inhalation capsule Place 1 capsule (18 mcg total) into inhaler and inhale daily. 10/14/22  Yes Shah, Pratik D, DO  oxyCODONE-acetaminophen (PERCOCET) 5-325 MG tablet Take 1 tablet by mouth every 6 (six) hours as needed for severe pain. Patient not taking:  Reported on 09/12/2022 02/13/22   Rosetta Posner, MD                                                                                                                                    Past Surgical History Past Surgical History:  Procedure Laterality Date   ABDOMINAL AORTOGRAM W/LOWER EXTREMITY Bilateral 09/20/2021   Procedure: ABDOMINAL AORTOGRAM W/LOWER EXTREMITY;  Surgeon: Broadus John, MD;  Location: [place] [facility];  Service: Cardiovascular;  Laterality: Bilateral;   AMPUTATION TOE Right 09/22/2021   Procedure: PARTIAL AMPUTATION SECOND RIGHT TOE;  Surgeon: Marty Heck, MD;  Location: [place];  Service: Vascular;  Laterality: Right;   [place] Right 04/19/2020   Procedure: Basilic Vein Transposition Right Arm;  Surgeon: Waynetta Sandy, MD;  Location: [place];  Service: Vascular;  Laterality: Right;   [place] Right 02/13/2022   Procedure: RIGHT ARM SECOND STAGE BASILIC VEIN TRANSPOSITION;  Surgeon: Rosetta Posner, MD;  Location: [facility];  Service: Vascular;  Laterality: Right;   COLON SURGERY     ENDARTERECTOMY FEMORAL Right 09/22/2021   Procedure: ENDARTERECTOMY FEMORAL WITH PATCH ANGIOPLASTY USING 1X6CM Weyman Pedro;  Surgeon: Marty Heck, MD;  Location: [place];  Service: Vascular;  Laterality: Right;   FEMORAL-TIBIAL BYPASS GRAFT Right 09/22/2021   Procedure: RIGHT FEMORAL TO BELOW KNEE POPLITEAL BYPASS WITH HARVEST OF GREATER SAPHENOUS VEIN;  Surgeon: Marty Heck, MD;  Location: [place];  Service: Vascular;  Laterality: Right;   INSERTION OF DIALYSIS CATHETER Right 04/19/2020   Procedure: Insertion Of Right Internal Jugular Dialysis Catheter;  Surgeon: Waynetta Sandy, MD;  Location: [facility];  Service: Vascular;  Laterality: Right;   MULTIPLE TOOTH EXTRACTIONS     PERIPHERAL VASCULAR BALLOON ANGIOPLASTY Right 09/20/2021   Procedure: PERIPHERAL VASCULAR BALLOON ANGIOPLASTY;  Surgeon: Broadus John, MD;   Location: [place] [facility];  Service: Cardiovascular;  Laterality: Right;  SFA  FAILED   REMOVAL OF A DIALYSIS CATHETER N/A 05/01/2022   Procedure: MINOR REMOVAL OF A TUNNELED DIALYSIS CATHETER;  Surgeon: Rosetta Posner, MD;  Location: [facility];  Service: Vascular;  Laterality: N/A;   Family History Family History  Problem Relation Age of Onset   Alzheimer's disease Mother     Social History Social History   Tobacco Use   Smoking status: Every Day    Packs/day: 0.50    Years: 62.00    Total pack years: 31.00    Types: Cigarettes   Smokeless  tobacco: Former    Types: Chew   Tobacco comments:    Started smoking at age 89 - 56 his own tobacco cigarettes   Vaping Use   Vaping Use: Never used  Substance Use Topics   Alcohol use: No   Drug use: Yes    Types: Marijuana    Comment: once every 3-4 weeks   Allergies Strawberry (diagnostic)  Review of Systems Review of Systems  Respiratory:  Positive for chest tightness and shortness of breath.   Cardiovascular:  Positive for chest pain.    Physical Exam Vital Signs  I have reviewed the triage vital signs BP (!) 118/53   Pulse 89   Temp 97.9 F (36.6 C) (Oral)   Resp (!) 24   Ht 5\' 5"  (1.651 m)   Wt 85.5 kg   SpO2 100%   BMI 31.37 kg/m   Physical Exam Vitals and nursing note reviewed.  Constitutional:      General: He is in acute distress.     Appearance: He is well-developed. He is ill-appearing.  HENT:     Head: Normocephalic and atraumatic.  Eyes:     Conjunctiva/sclera: Conjunctivae normal.  Cardiovascular:     Rate and Rhythm: Regular rhythm. Tachycardia present.     Heart sounds: No murmur heard. Pulmonary:     Effort: Respiratory distress present.     Breath sounds: Wheezing and rales present.  Abdominal:     Palpations: Abdomen is soft.     Tenderness: There is no abdominal tenderness.  Musculoskeletal:        General: No swelling.     Cervical back: Neck supple.  Skin:    General: Skin is  warm and dry.     Capillary Refill: Capillary refill takes less than 2 seconds.  Neurological:     Mental Status: He is alert.  Psychiatric:        Mood and Affect: Mood normal.     ED Results and Treatments Labs (all labs ordered are listed, but only abnormal results are displayed) Labs Reviewed  COMPREHENSIVE METABOLIC PANEL - Abnormal; Notable for the following components:      Result Value   Sodium 133 (*)    Chloride 91 (*)    Glucose, Bld 230 (*)    BUN 62 (*)    Creatinine, Ser 8.75 (*)    Calcium 8.3 (*)    Albumin 3.4 (*)    GFR, Estimated 6 (*)    Anion gap 17 (*)    All other components within normal limits  CBC WITH DIFFERENTIAL/PLATELET - Abnormal; Notable for the following components:   WBC 13.0 (*)    RBC 3.51 (*)    Hemoglobin 11.7 (*)    HCT 34.6 (*)    Neutro Abs 11.5 (*)    Abs Immature Granulocytes 0.08 (*)    All other components within normal limits  BRAIN NATRIURETIC PEPTIDE - Abnormal; Notable for the following components:   B Natriuretic Peptide 1,821.0 (*)    All other components within normal limits  BLOOD GAS, VENOUS - Abnormal; Notable for the following components:   pO2, Ven <31 (*)    Bicarbonate 29.5 (*)    Acid-Base Excess 2.1 (*)    All other components within normal limits  COMPREHENSIVE METABOLIC PANEL - Abnormal; Notable for the following components:   Sodium 130 (*)    Chloride 92 (*)    Glucose, Bld 173 (*)    BUN 68 (*)  Creatinine, Ser 9.06 (*)    Calcium 8.2 (*)    Albumin 3.0 (*)    AST 47 (*)    GFR, Estimated 6 (*)    All other components within normal limits  PHOSPHORUS - Abnormal; Notable for the following components:   Phosphorus 7.4 (*)    All other components within normal limits  CBC - Abnormal; Notable for the following components:   RBC 2.89 (*)    Hemoglobin 9.6 (*)    HCT 28.6 (*)    All other components within normal limits  D-DIMER, QUANTITATIVE - Abnormal; Notable for the following components:    D-Dimer, Quant 1.12 (*)    All other components within normal limits  HEPARIN LEVEL (UNFRACTIONATED) - Abnormal; Notable for the following components:   Heparin Unfractionated 0.19 (*)    All other components within normal limits  TROPONIN I (HIGH SENSITIVITY) - Abnormal; Notable for the following components:   Troponin I (High Sensitivity) 226 (*)    All other components within normal limits  TROPONIN I (HIGH SENSITIVITY) - Abnormal; Notable for the following components:   Troponin I (High Sensitivity) 1,286 (*)    All other components within normal limits  TROPONIN I (HIGH SENSITIVITY) - Abnormal; Notable for the following components:   Troponin I (High Sensitivity) 3,927 (*)    All other components within normal limits  TROPONIN I (HIGH SENSITIVITY) - Abnormal; Notable for the following components:   Troponin I (High Sensitivity) 6,053 (*)    All other components within normal limits  EXPECTORATED SPUTUM ASSESSMENT W GRAM STAIN, RFLX TO RESP C  HEPATITIS B SURFACE ANTIGEN  PROCALCITONIN  MAGNESIUM  HEPATITIS B SURFACE ANTIBODY, QUANTITATIVE  HEPARIN LEVEL (UNFRACTIONATED)                                                                                                                          Radiology ECHOCARDIOGRAM LIMITED  Result Date: 10/18/2022    ECHOCARDIOGRAM LIMITED REPORT   Patient Name:   DAHLTON HINDE Date of Exam: 10/18/2022 Medical Rec #:  [id number]    Height:       65.0 in Accession #:    [id number]   Weight:       188.5 lb Date of Birth:  04/05/1951    BSA:          1.929 m Patient Age:    27 years     BP:           125/63 mmHg Patient Gender: M            HR:           76 bpm. Exam Location:  [facility] Procedure: Limited Echo Indications:    NSTEMI  History:        Patient has prior history of Echocardiogram examinations, most                 recent 10/13/2022. CHF, Acute MI, COPD; Risk  Factors:Hypertension.  Sonographer:    Wenda Low Referring Phys:  [id number] [place]  1. Limited Echo to assess change in LVEF. Doppler was not performed.  2. Left ventricular ejection fraction, by estimation, is 70 to 75%. The left ventricle has hyperdynamic function. The left ventricle has no regional wall motion abnormalities. There is mild left ventricular hypertrophy.  3. Severe mitral annular calcification.  4. The inferior vena cava is normal in size with greater than 50% respiratory variability, suggesting right atrial pressure of 3 mmHg.Limited FINDINGS  Left Ventricle: Left ventricular ejection fraction, by estimation, is 70 to 75%. The left ventricle has hyperdynamic function. The left ventricle has no regional wall motion abnormalities. There is mild left ventricular hypertrophy. Mitral Valve: Severe mitral annular calcification. Tricuspid Valve: The tricuspid valve is not well visualized. Aortic Valve: The aortic valve was not well visualized. Pulmonic Valve: The pulmonic valve was not well visualized. Venous: The inferior vena cava is normal in size with greater than 50% respiratory variability, suggesting right atrial pressure of 3 mmHg. LEFT VENTRICLE PLAX 2D LVIDd:         4.60 cm LVIDs:         2.80 cm LV PW:         1.30 cm LV IVS:        1.30 cm  LEFT ATRIUM         Index LA diam:    4.60 cm 2.38 cm/m Vishnu Priya Mallipeddi Electronically signed by Lorelee Cover Mallipeddi Signature Date/Time: 10/18/2022/11:37:44 AM    Final    DG Chest Portable 1 View  Result Date: 10/17/2022 CLINICAL DATA:  Shortness of breath. EXAM: PORTABLE CHEST 1 VIEW COMPARISON:  Chest radiograph dated 10/13/2022. FINDINGS: Diffuse interstitial prominence and Kerley B-lines consistent with edema. Mild blunting of the costophrenic angles, new or progressed since the prior radiograph and may represent small pleural effusions. No consolidative changes. No pneumothorax. The cardiac silhouette is within normal limits. Atherosclerotic calcification of the aorta. No  acute osseous pathology. IMPRESSION: Relatively similar appearance of interstitial edema. Electronically Signed   By: Anner Crete M.D.   On: 10/17/2022 19:55    Pertinent labs & imaging results that were available during my care of the patient were reviewed by me and considered in my medical decision making (see MDM for details).  Medications Ordered in ED Medications  methylPREDNISolone sodium succinate (SOLU-MEDROL) 125 mg/2 mL injection 125 mg (125 mg Intravenous Not Given 10/17/22 1924)  Chlorhexidine Gluconate Cloth 2 % PADS 6 each (6 each Topical Not Given 10/18/22 0501)  pentafluoroprop-tetrafluoroeth (GEBAUERS) aerosol 1 Application (has no administration in time range)  lidocaine (PF) (XYLOCAINE) 1 % injection 5 mL (has no administration in time range)  lidocaine-prilocaine (EMLA) cream 1 Application (has no administration in time range)  heparin injection 1,000 Units (has no administration in time range)  amLODipine (NORVASC) tablet 2.5 mg (2.5 mg Oral Not Given 10/17/22 2338)  metoprolol succinate (TOPROL-XL) 24 hr tablet 25 mg (25 mg Oral Given 10/18/22 1432)  tamsulosin (FLOMAX) capsule 0.4 mg (0.4 mg Oral Not Given 10/17/22 2338)  albuterol (PROVENTIL) (2.5 MG/3ML) 0.083% nebulizer solution 2.5 mg (2.5 mg Nebulization Given 10/18/22 1026)  fluticasone furoate-vilanterol (BREO ELLIPTA) 200-25 MCG/ACT 1 puff (1 puff Inhalation Not Given 10/18/22 1034)  acetaminophen (TYLENOL) tablet 650 mg (has no administration in time range)    Or  acetaminophen (TYLENOL) suppository 650 mg (has no administration in time range)  oxyCODONE (Oxy IR/ROXICODONE) immediate release tablet 5  mg (has no administration in time range)  morphine (PF) 2 MG/ML injection 2 mg (has no administration in time range)  ondansetron (ZOFRAN) tablet 4 mg (has no administration in time range)    Or  ondansetron (ZOFRAN) injection 4 mg (has no administration in time range)  umeclidinium bromide (INCRUSE ELLIPTA) 62.5  MCG/ACT 1 puff (1 puff Inhalation Not Given 10/18/22 1034)  heparin ADULT infusion 100 units/mL (25000 units/224mL) (1,350 Units/hr Intravenous Rate/Dose Change 10/18/22 1435)  aspirin EC tablet 81 mg (81 mg Oral Given 10/18/22 1432)  rosuvastatin (CRESTOR) tablet 40 mg (40 mg Oral Given 10/18/22 1432)  calcitRIOL (ROCALTROL) capsule 0.25 mcg (0.25 mcg Oral Given 10/18/22 1432)  sucroferric oxyhydroxide (VELPHORO) chewable tablet 500 mg (has no administration in time range)  ipratropium-albuterol (DUONEB) 0.5-2.5 (3) MG/3ML nebulizer solution 9 mL (9 mLs Nebulization Given 10/17/22 1923)  furosemide (LASIX) injection 40 mg (40 mg Intravenous Given 10/17/22 2028)  aspirin EC tablet 325 mg (325 mg Oral Given 10/18/22 0213)  heparin bolus via infusion 2,000 Units (2,000 Units Intravenous Bolus from Bag 10/18/22 0215)  heparin bolus via infusion 2,000 Units (2,000 Units Intravenous Bolus from Bag 10/18/22 1431)                                                                                                                                     Procedures .Critical Care  Performed by: Teressa Lower, MD Authorized by: Teressa Lower, MD   Critical care provider statement:    Critical care time (minutes):  30   Critical care was necessary to treat or prevent imminent or life-threatening deterioration of the following conditions:  Cardiac failure and respiratory failure   Critical care was time spent personally by me on the following activities:  Development of treatment plan with patient or surrogate, discussions with consultants, evaluation of patient's response to treatment, examination of patient, ordering and review of laboratory studies, ordering and review of radiographic studies, ordering and performing treatments and interventions, pulse oximetry, re-evaluation of patient's condition and review of old charts   (including critical care time)  Medical Decision Making / ED Course   This patient  presents to the ED for concern of shortness of breath, this involves an extensive number of treatment options, and is a complaint that carries with it a high risk of complications and morbidity.  The differential diagnosis includes COPD exacerbation, CHF exacerbation, ACS, pneumonia, PE  MDM: Patient seen in the emergency room for evaluation of chest pain, shortness of breath.  Physical exam with wheezes at bilateral bases and accessory muscle use on CPAP.  Patient transition to BiPAP and patient received 3 back-to-back DuoNebs with improvement of his oxygen saturations.  Initial laboratory evaluation with a leukocytosis to 13.0, hemoglobin 11.7, BNP 1821, initial pH 7.31 with a PCO2 of 59.  Initial troponin 226 and I spoke with the cardiology fellow on-call Dr. Sharol Roussel who reviewed the patient's EKG  and believes is likely type II NSTEMI in the setting of suspected flash pulmonary edema/fluid overload and a heart failure exacerbation with concomitant COPD exacerbation.  Chest x-ray shows persistent pulmonary edema.  Patient appears much improved on the BiPAP and will require hospital admission for NSTEMI, COPD exacerbation and CHF exacerbation.   Additional history obtained:  -External records from outside source obtained and reviewed including: Chart review including previous notes, labs, imaging, consultation notes   Lab Tests: -I ordered, reviewed, and interpreted labs.   The pertinent results include:   Labs Reviewed  COMPREHENSIVE METABOLIC PANEL - Abnormal; Notable for the following components:      Result Value   Sodium 133 (*)    Chloride 91 (*)    Glucose, Bld 230 (*)    BUN 62 (*)    Creatinine, Ser 8.75 (*)    Calcium 8.3 (*)    Albumin 3.4 (*)    GFR, Estimated 6 (*)    Anion gap 17 (*)    All other components within normal limits  CBC WITH DIFFERENTIAL/PLATELET - Abnormal; Notable for the following components:   WBC 13.0 (*)    RBC 3.51 (*)    Hemoglobin 11.7 (*)    HCT  34.6 (*)    Neutro Abs 11.5 (*)    Abs Immature Granulocytes 0.08 (*)    All other components within normal limits  BRAIN NATRIURETIC PEPTIDE - Abnormal; Notable for the following components:   B Natriuretic Peptide 1,821.0 (*)    All other components within normal limits  BLOOD GAS, VENOUS - Abnormal; Notable for the following components:   pO2, Ven <31 (*)    Bicarbonate 29.5 (*)    Acid-Base Excess 2.1 (*)    All other components within normal limits  COMPREHENSIVE METABOLIC PANEL - Abnormal; Notable for the following components:   Sodium 130 (*)    Chloride 92 (*)    Glucose, Bld 173 (*)    BUN 68 (*)    Creatinine, Ser 9.06 (*)    Calcium 8.2 (*)    Albumin 3.0 (*)    AST 47 (*)    GFR, Estimated 6 (*)    All other components within normal limits  PHOSPHORUS - Abnormal; Notable for the following components:   Phosphorus 7.4 (*)    All other components within normal limits  CBC - Abnormal; Notable for the following components:   RBC 2.89 (*)    Hemoglobin 9.6 (*)    HCT 28.6 (*)    All other components within normal limits  D-DIMER, QUANTITATIVE - Abnormal; Notable for the following components:   D-Dimer, Quant 1.12 (*)    All other components within normal limits  HEPARIN LEVEL (UNFRACTIONATED) - Abnormal; Notable for the following components:   Heparin Unfractionated 0.19 (*)    All other components within normal limits  TROPONIN I (HIGH SENSITIVITY) - Abnormal; Notable for the following components:   Troponin I (High Sensitivity) 226 (*)    All other components within normal limits  TROPONIN I (HIGH SENSITIVITY) - Abnormal; Notable for the following components:   Troponin I (High Sensitivity) 1,286 (*)    All other components within normal limits  TROPONIN I (HIGH SENSITIVITY) - Abnormal; Notable for the following components:   Troponin I (High Sensitivity) 3,927 (*)    All other components within normal limits  TROPONIN I (HIGH SENSITIVITY) - Abnormal; Notable  for the following components:   Troponin I (High Sensitivity) 6,053 (*)  All other components within normal limits  EXPECTORATED SPUTUM ASSESSMENT W GRAM STAIN, RFLX TO RESP C  HEPATITIS B SURFACE ANTIGEN  PROCALCITONIN  MAGNESIUM  HEPATITIS B SURFACE ANTIBODY, QUANTITATIVE  HEPARIN LEVEL (UNFRACTIONATED)      EKG   EKG Interpretation  Date/Time:  Wednesday October 17 2022 18:58:21 EST Ventricular Rate:  125 PR Interval:  156 QRS Duration: 92 QT Interval:  303 QTC Calculation: 437 R Axis:   11 Text Interpretation: Sinus tachycardia Multiform ventricular premature complexes Confirmed by Elida Harbin ([id number]) on 10/17/2022 8:36:57 PM         Imaging Studies ordered: I ordered imaging studies including chest x-ray I independently visualized and interpreted imaging. I agree with the radiologist interpretation   Medicines ordered and prescription drug management: Meds ordered this encounter  Medications   ipratropium-albuterol (DUONEB) 0.5-2.5 (3) MG/3ML nebulizer solution 9 mL   methylPREDNISolone sodium succinate (SOLU-MEDROL) 125 mg/2 mL injection 125 mg   furosemide (LASIX) injection 40 mg   Chlorhexidine Gluconate Cloth 2 % PADS 6 each   pentafluoroprop-tetrafluoroeth (GEBAUERS) aerosol 1 Application   lidocaine (PF) (XYLOCAINE) 1 % injection 5 mL   lidocaine-prilocaine (EMLA) cream 1 Application   heparin injection 1,000 Units   DISCONTD: aspirin EC tablet 325 mg    Once every 2 days     amLODipine (NORVASC) tablet 2.5 mg   metoprolol succinate (TOPROL-XL) 24 hr tablet 25 mg   tamsulosin (FLOMAX) capsule 0.4 mg   albuterol (PROVENTIL) (2.5 MG/3ML) 0.083% nebulizer solution 2.5 mg   fluticasone furoate-vilanterol (BREO ELLIPTA) 200-25 MCG/ACT 1 puff   DISCONTD: tiotropium (SPIRIVA) inhalation capsule ([facility] use ONLY) 18 mcg   DISCONTD: heparin injection 5,000 Units   OR Linked Order Group    acetaminophen (TYLENOL) tablet 650 mg    acetaminophen (TYLENOL)  suppository 650 mg   oxyCODONE (Oxy IR/ROXICODONE) immediate release tablet 5 mg   morphine (PF) 2 MG/ML injection 2 mg   OR Linked Order Group    ondansetron (ZOFRAN) tablet 4 mg    ondansetron (ZOFRAN) injection 4 mg   DISCONTD: aspirin EC tablet 325 mg    Once every 2 days     umeclidinium bromide (INCRUSE ELLIPTA) 62.5 MCG/ACT 1 puff   aspirin EC tablet 325 mg   heparin bolus via infusion 2,000 Units   heparin ADULT infusion 100 units/mL (25000 units/237mL)   aspirin EC tablet 81 mg   rosuvastatin (CRESTOR) tablet 40 mg   calcitRIOL (ROCALTROL) capsule 0.25 mcg   sucroferric oxyhydroxide (VELPHORO) chewable tablet 500 mg   heparin bolus via infusion 2,000 Units    -I have reviewed the patients home medicines and have made adjustments as needed  Critical interventions BiPAP, DuoNebs, Lasix  Consultations Obtained: I requested consultation with the nephrologist on-call Dr. Osborne Casco and cardiology fellow on-call,  and discussed lab and imaging findings as well as pertinent plan - they recommend: Dialysis likely in the morning, delta troponins   Cardiac Monitoring: The patient was maintained on a cardiac monitor.  I personally viewed and interpreted the cardiac monitored which showed an underlying rhythm of: Sinus tachycardia  Social Determinants of Health:  Factors impacting patients care include: none   Reevaluation: After the interventions noted above, I reevaluated the patient and found that they have :improved  Co morbidities that complicate the patient evaluation  Past Medical History:  Diagnosis Date   Anemia    CHF (congestive heart failure) (HCC)    COPD (chronic obstructive pulmonary disease) ([place])  Dialysis patient [facility])    Full dentures    Hypertension    Pneumonia    Pyelonephritis 09/11/2017   Renal disorder    Stroke [facility])    " A few mini strokes, I didn't know it "      Dispostion: I considered admission for this patient, and due to NSTEMI and  shortness of breath on BiPAP, patient will for hospital admission     Final Clinical Impression(s) / ED Diagnoses Final diagnoses:  None     @PCDICTATION @    Teressa Lower, MD 10/18/22 1537

## 2022-10-18 NOTE — Progress Notes (Signed)
Received patient in bed to unit.  Alert and oriented.  Informed consent signed and in chart.   Treatment initiated: 0940 Treatment completed: 5170  Patient tolerated well.  Transported back to the room  Alert, without acute distress.  Hand-off given to patient's nurse.   Access used: AVF Access issues: None  Total UF removed: 2.8 L Medication(s) given: none Post HD VS: 115/62 P 84 R 18  Post HD weight: 83 KG   [facility]

## 2022-10-18 NOTE — Progress Notes (Signed)
ASSUMPTION OF CARE NOTE   10/18/2022 9:09 AM  Kerry Underwood was seen and examined.  The H&P by the admitting provider, orders, imaging was reviewed.  Please see new orders.  Discussed with Bernerd Pho with cardiology service and recommendation is to transfer to [facility] for cardiac catheterization today after HD treatment.  Pt will be able to stay on [facility] service at [facility].  He is now off bipap.  He remains dyspneic.  Vitals are stable.  HS trop peaked at 6053 at this time.  See cardiology notes for other recs.  He remains on IV heparin infusion.  Signed out care to Dr. Erlinda Hong at [facility] as requested by bed placement team.     Vitals:   10/18/22 0800 10/18/22 0830  BP: 125/79 134/72  Pulse: 71 82  Resp: 19 (!) 24  Temp:    SpO2: 100% 100%    Results for orders placed or performed during the hospital encounter of 10/17/22  Comprehensive metabolic panel  Result Value Ref Range   Sodium 133 (L) 135 - 145 mmol/L   Potassium 5.1 3.5 - 5.1 mmol/L   Chloride 91 (L) 98 - 111 mmol/L   CO2 25 22 - 32 mmol/L   Glucose, Bld 230 (H) 70 - 99 mg/dL   BUN 62 (H) 8 - 23 mg/dL   Creatinine, Ser 8.75 (H) 0.61 - 1.24 mg/dL   Calcium 8.3 (L) 8.9 - 10.3 mg/dL   Total Protein 7.9 6.5 - 8.1 g/dL   Albumin 3.4 (L) 3.5 - 5.0 g/dL   AST 40 15 - 41 U/L   ALT 19 0 - 44 U/L   Alkaline Phosphatase 59 38 - 126 U/L   Total Bilirubin 0.6 0.3 - 1.2 mg/dL   GFR, Estimated 6 (L) >60 mL/min   Anion gap 17 (H) 5 - 15  CBC with Differential  Result Value Ref Range   WBC 13.0 (H) 4.0 - 10.5 K/uL   RBC 3.51 (L) 4.22 - 5.81 MIL/uL   Hemoglobin 11.7 (L) 13.0 - 17.0 g/dL   HCT 34.6 (L) 39.0 - 52.0 %   MCV 98.6 80.0 - 100.0 fL   MCH 33.3 26.0 - 34.0 pg   MCHC 33.8 30.0 - 36.0 g/dL   RDW 14.7 11.5 - 15.5 %   Platelets 377 150 - 400 K/uL   nRBC 0.0 0.0 - 0.2 %   Neutrophils Relative % 88 %   Neutro Abs 11.5 (H) 1.7 - 7.7 K/uL   Lymphocytes Relative 8 %   Lymphs Abs 1.1 0.7 - 4.0 K/uL   Monocytes Relative 3 %   Monocytes Absolute  0.4 0.1 - 1.0 K/uL   Eosinophils Relative 0 %   Eosinophils Absolute 0.0 0.0 - 0.5 K/uL   Basophils Relative 0 %   Basophils Absolute 0.0 0.0 - 0.1 K/uL   Immature Granulocytes 1 %   Abs Immature Granulocytes 0.08 (H) 0.00 - 0.07 K/uL  Brain natriuretic peptide  Result Value Ref Range   B Natriuretic Peptide 1,821.0 (H) 0.0 - 100.0 pg/mL  Blood gas, venous (at [facility] and [facility], not at [facility])  Result Value Ref Range   pH, Ven 7.31 7.25 - 7.43   pCO2, Ven 59 44 - 60 mmHg   pO2, Ven <31 (LL) 32 - 45 mmHg   Bicarbonate 29.5 (H) 20.0 - 28.0 mmol/L   Acid-Base Excess 2.1 (H) 0.0 - 2.0 mmol/L   O2 Saturation 46.7 %   Patient temperature 36.6    Collection  site BLOOD LEFT HAND    Drawn by [id number]   Procalcitonin - Baseline  Result Value Ref Range   Procalcitonin 0.27 ng/mL  Comprehensive metabolic panel  Result Value Ref Range   Sodium 130 (L) 135 - 145 mmol/L   Potassium 4.7 3.5 - 5.1 mmol/L   Chloride 92 (L) 98 - 111 mmol/L   CO2 24 22 - 32 mmol/L   Glucose, Bld 173 (H) 70 - 99 mg/dL   BUN 68 (H) 8 - 23 mg/dL   Creatinine, Ser 9.06 (H) 0.61 - 1.24 mg/dL   Calcium 8.2 (L) 8.9 - 10.3 mg/dL   Total Protein 7.0 6.5 - 8.1 g/dL   Albumin 3.0 (L) 3.5 - 5.0 g/dL   AST 47 (H) 15 - 41 U/L   ALT 16 0 - 44 U/L   Alkaline Phosphatase 51 38 - 126 U/L   Total Bilirubin 0.4 0.3 - 1.2 mg/dL   GFR, Estimated 6 (L) >60 mL/min   Anion gap 14 5 - 15  Magnesium  Result Value Ref Range   Magnesium 2.0 1.7 - 2.4 mg/dL  Phosphorus  Result Value Ref Range   Phosphorus 7.4 (H) 2.5 - 4.6 mg/dL  CBC  Result Value Ref Range   WBC 7.3 4.0 - 10.5 K/uL   RBC 2.89 (L) 4.22 - 5.81 MIL/uL   Hemoglobin 9.6 (L) 13.0 - 17.0 g/dL   HCT 28.6 (L) 39.0 - 52.0 %   MCV 99.0 80.0 - 100.0 fL   MCH 33.2 26.0 - 34.0 pg   MCHC 33.6 30.0 - 36.0 g/dL   RDW 14.7 11.5 - 15.5 %   Platelets 186 150 - 400 K/uL   nRBC 0.0 0.0 - 0.2 %  D-dimer, quantitative  Result Value Ref Range   D-Dimer, Quant 1.12 (H) 0.00 - 0.50 ug/mL-FEU   Troponin I (High Sensitivity)  Result Value Ref Range   Troponin I (High Sensitivity) 226 (HH) <18 ng/L  Troponin I (High Sensitivity)  Result Value Ref Range   Troponin I (High Sensitivity) 1,286 (HH) <18 ng/L  Troponin I (High Sensitivity)  Result Value Ref Range   Troponin I (High Sensitivity) 3,927 (HH) <18 ng/L  Troponin I (High Sensitivity)  Result Value Ref Range   Troponin I (High Sensitivity) 6,053 (HH) <18 ng/L   C. Wynetta Emery, MD [facility]   10/17/2022  7:01 PM How to contact the [facility] Attending or Consulting provider [place] or covering provider during after hours [place], for this patient?  Check the care team in [facility] and look for a) attending/consulting [facility] provider listed and b) the [facility] team listed Log into [email or web address] and use [place]'s universal password to access. If you do not have the password, please contact the hospital operator. Locate the [facility] provider you are looking for under [facility] and page to a number that you can be directly reached. If you still have difficulty reaching the provider, please page the [facility] (Director on Call) for the Hospitalists listed on amion for assistance.

## 2022-10-18 NOTE — Interval H&P Note (Signed)
History and Physical Interval Note:  10/18/2022 3:52 PM  Kerry Underwood  has presented today for surgery, with the diagnosis of nstemi.  The various methods of treatment have been discussed with the patient and family. After consideration of risks, benefits and other options for treatment, the patient has consented to  Procedure(s): LEFT HEART CATH AND CORONARY ANGIOGRAPHY (N/A) as a surgical intervention.  The patient's history has been reviewed, patient examined, no change in status, stable for surgery.  I have reviewed the patient's chart and labs.  Questions were answered to the patient's satisfaction.   Cath Lab Visit (complete for each Cath Lab visit)  Clinical Evaluation Leading to the Procedure:   ACS: Yes.    Non-ACS:    Anginal Classification: CCS III  Anti-ischemic medical therapy: Maximal Therapy (2 or more classes of medications)  Non-Invasive Test Results: No non-invasive testing performed  Prior CABG: No previous CABG        Collier Salina [facility] 10/18/2022 3:52 PM

## 2022-10-18 NOTE — ED Notes (Signed)
1422- report to [place] with [facility] 1440- report to [place] at [facility] cath lab

## 2022-10-18 NOTE — ED Notes (Signed)
Date and time results received: 10/18/22 0544 (use smartphrase ".now" to insert current time)  Test: Troponin Critical Value: [place]  Name of Provider Notified: Dr Earnest Conroy  Orders Received? Or Actions Taken?: NA

## 2022-10-18 NOTE — Progress Notes (Signed)
ANTICOAGULATION CONSULT NOTE -   Pharmacy Consult for heparin Indication: chest pain/ACS  Allergies  Allergen Reactions   Strawberry (Diagnostic) Rash    Patient Measurements: Height: 5\' 5"  (165.1 cm) Weight: 85.5 kg (188 lb 7.9 oz) IBW/kg (Calculated) : 61.5 Heparin Dosing Weight: 80kg  Vital Signs: Temp: 97.8 F (36.6 C) (11/09 0940) Temp Source: Oral (11/09 0940) BP: 121/67 (11/09 1230) Pulse Rate: 84 (11/09 1230)  Labs: Recent Labs    10/17/22 1912 10/17/22 2104 10/17/22 2344 10/18/22 0318 10/18/22 0941  HGB 11.7*  --   --  9.6*  --   HCT 34.6*  --   --  28.6*  --   PLT 377  --   --  186  --   HEPARINUNFRC  --   --   --   --  0.19*  CREATININE 8.75*  --   --  9.06*  --   TROPONINIHS 226* 1,286* 3,927* 6,053*  --      Estimated Creatinine Clearance: 7.3 mL/min (A) (by C-G formula based on SCr of 9.06 mg/dL (H)).   Medical History: Past Medical History:  Diagnosis Date   Anemia    CHF (congestive heart failure) (HCC)    COPD (chronic obstructive pulmonary disease) ([place])    Dialysis patient ([place])    Full dentures    Hypertension    Pneumonia    Pyelonephritis 09/11/2017   Renal disorder    Stroke [facility])    " A few mini strokes, I didn't know it "    Assessment: 71yo male presents w/ respiratory distress, initial troponin elevated and rising (226>1286>3927>6053) >> to begin heparin.  Hgb 9.6 HL 0.19  Goal of Therapy:  Heparin level 0.3-0.7 units/ml Monitor platelets by anticoagulation protocol: Yes   Plan:  Rebolus heparin 2000 units IV  Increase heparin infusion to 1350 units/hr  Monitor heparin levels and CBC.  Margot Ables, PharmD Clinical Pharmacist 10/18/2022 1:02 PM

## 2022-10-18 NOTE — Assessment & Plan Note (Signed)
-   Initially there was some concern for COPD exacerbation - I think this is more cardiac related at this point - PCO2 59, pH 7.31 on venous blood gas - Patient received DuoNeb and Solu-Medrol prior to admission - Continue Breo - Continue albuterol as needed - Continue BiPAP - Continue to monitor

## 2022-10-18 NOTE — ED Notes (Signed)
Hospital medicine in with pt

## 2022-10-18 NOTE — Progress Notes (Signed)
*  PRELIMINARY RESULTS* Echocardiogram 2D Echocardiogram has been performed.  Kerry Underwood 10/18/2022, 11:29 AM

## 2022-10-18 NOTE — ED Notes (Signed)
Returned from dialysis

## 2022-10-18 NOTE — Assessment & Plan Note (Signed)
Continue Norvasc and metoprolol.

## 2022-10-18 NOTE — Progress Notes (Signed)
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  Allergies  Allergen Reactions   Strawberry (Diagnostic) Rash    Patient Measurements: Height: 5\' 5"  (165.1 cm) Weight: 85.5 kg (188 lb 7.9 oz) IBW/kg (Calculated) : 61.5 Heparin Dosing Weight: 80kg  Vital Signs: Temp: 97.9 F (36.6 C) (11/09 1431) Temp Source: Oral (11/09 1431) BP: 122/50 (11/09 1550) Pulse Rate: 82 (11/09 1550)  Labs: Recent Labs    10/17/22 1912 10/17/22 2104 10/17/22 2344 10/18/22 0318 10/18/22 0941  HGB 11.7*  --   --  9.6*  --   HCT 34.6*  --   --  28.6*  --   PLT 377  --   --  186  --   HEPARINUNFRC  --   --   --   --  0.19*  CREATININE 8.75*  --   --  9.06*  --   TROPONINIHS 226* 1,286* 3,927* 6,053*  --      Estimated Creatinine Clearance: 7.3 mL/min (A) (by C-G formula based on SCr of 9.06 mg/dL (H)).  Assessment: 71yo male presents w/ respiratory distress, initial troponin elevated and rising (226>1286>3927>6053) >> to begin heparin.  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 Pharmacy consulted to resume heparin drip 8 hours post sheath removal. Sheath was removed ~17:15. No bleeding noted.   Goal of Therapy:  Heparin level 0.3-0.7 units/ml Monitor platelets by anticoagulation protocol: Yes   Plan:  Resume heparin infusion at 1350 units/hr with no bolus at 01:15 on 11/10 8h heparin level Daily heparin level and CBC Monitor for s/sx of bleeding  Thank you for involving pharmacy in this patient's care.  Renold Genta, PharmD, BCPS Clinical Pharmacist Clinical phone for 10/18/2022 until 10p is [phone] 10/18/2022 5:22 PM

## 2022-10-18 NOTE — Assessment & Plan Note (Signed)
-   Nephro consulted from the ED - Plan for dialysis in the a.m.

## 2022-10-18 NOTE — Assessment & Plan Note (Signed)
-   Last echo showed ejection fraction 70-75% with indeterminate diastolic dysfunction - Continue aspirin, metoprolol - Consult cardiology due to NSTEMI - Continue to monitor

## 2022-10-18 NOTE — Consult Note (Signed)
Cardiology Consultation   Patient ID: Kerry Underwood MRN: [id number]; DOB: [birth year]  Admit date: 10/17/2022 Date of Consult: 10/18/2022  PCP:  [name], [facility] Cardiologist:  Freada Bergeron, MD        Patient Profile:   Kerry Underwood is a 71 y.o. male with a hx of PAD (s/p right femoral endarterectomy and fem-pop bypass in 09/2021), aortic stenosis, HTN, COPD, ESRD and tobacco use who is being seen 10/18/2022 for the evaluation of NSTEMI at the request of Dr. Clearence Ped.  History of Present Illness:   Kerry Underwood was examined by myself in 12/2021 following a recent hospitalization for acute hypoxic respiratory failure in the setting of influenza A and a COPD exacerbation. Cardiology was consulted during admission given troponin values of 546 but echocardiogram showed preserved EF with no wall motion abnormalities. He did have moderate aortic stenosis. Options for ischemic evaluation were reviewed at the time of his visit but he declined further testing. He had a follow-up visit scheduled with Dr. Johney Frame in 06/2022 but canceled the visit.   He was admitted to [facility] from 11/4 - 10/14/2022 for acute hypoxic respiratory failure in the setting of a CHF exacerbation and COPD exacerbation. Volume was managed with HD and repeat echocardiogram showed that his EF was preserved at 70 to 75% with no regional wall motion abnormalities. RV function was normal but PASP was moderately elevated at 57.3 mmHg. He did have mild MR, moderate MS and moderate to severe aortic valve stenosis. He did have a hospital follow-up visit with Dr. Johney Frame on 10/16/2022 but no showed for the visit.  He presented to [facility] ED on 10/17/2022 for evaluation of worsening dyspnea. He reports having worsening dyspnea on the day of his prior hospital admission but says he developed recurrent dyspnea yesterday which prompted him to seek evaluation. Says he was trying to change  the oil in his car and could not catch his breath. He denies any associated chest pain or palpitations. No recent orthopnea, PND or pitting edema. He does report frequent wheezing and also reports coughing up a white frothy material over the past few days. He does report cramping along his lower extremities and feels like too much fluid has been removed during his dialysis sessions.  Initial labs showed WBC 13.0, Hgb 11.7, platelets 377, Na+ 133, K+ 5.1 and creatinine 8.75.  Albumin 3.4. AST 40 and ALT 19. BNP elevated to 1821. Procalcitonin at 0.27.  Venous blood gas showed pH 7.31 with O2 less than 31 and CO2 of 59. Initial Hs Troponin was at 226 with repeat values at 1286, 3927 and 6053. CXR showed interstitial edema. EKG shows sinus tachycardia, HR 125 with PVC's and slight ST depression along lateral leads.   He was placed on BiPAP in the ED and started on breathing treatments. Received 325mg  ASA and was started on Heparin while being continued on Toprol-XL 25mg  daily.    Past Medical History:  Diagnosis Date   Anemia    CHF (congestive heart failure) (HCC)    COPD (chronic obstructive pulmonary disease) ([place])    Dialysis patient [facility])    Full dentures    Hypertension    Pneumonia    Pyelonephritis 09/11/2017   Renal disorder    Stroke [facility])    " A few mini strokes, I didn't know it "    Past Surgical History:  Procedure Laterality Date   ABDOMINAL AORTOGRAM W/LOWER EXTREMITY Bilateral 09/20/2021  Procedure: ABDOMINAL AORTOGRAM W/LOWER EXTREMITY;  Surgeon: Broadus John, MD;  Location: [place] [facility];  Service: Cardiovascular;  Laterality: Bilateral;   AMPUTATION TOE Right 09/22/2021   Procedure: PARTIAL AMPUTATION SECOND RIGHT TOE;  Surgeon: Marty Heck, MD;  Location: [place];  Service: Vascular;  Laterality: Right;   [place] Right 04/19/2020   Procedure: Basilic Vein Transposition Right Arm;  Surgeon: Waynetta Sandy, MD;  Location: [place];  Service: Vascular;  Laterality: Right;   [place] Right 02/13/2022   Procedure: RIGHT ARM SECOND STAGE BASILIC VEIN TRANSPOSITION;  Surgeon: Rosetta Posner, MD;  Location: [facility];  Service: Vascular;  Laterality: Right;   COLON SURGERY     ENDARTERECTOMY FEMORAL Right 09/22/2021   Procedure: ENDARTERECTOMY FEMORAL WITH PATCH ANGIOPLASTY USING 1X6CM Weyman Pedro;  Surgeon: Marty Heck, MD;  Location: [place];  Service: Vascular;  Laterality: Right;   FEMORAL-TIBIAL BYPASS GRAFT Right 09/22/2021   Procedure: RIGHT FEMORAL TO BELOW KNEE POPLITEAL BYPASS WITH HARVEST OF GREATER SAPHENOUS VEIN;  Surgeon: Marty Heck, MD;  Location: [place];  Service: Vascular;  Laterality: Right;   INSERTION OF DIALYSIS CATHETER Right 04/19/2020   Procedure: Insertion Of Right Internal Jugular Dialysis Catheter;  Surgeon: Waynetta Sandy, MD;  Location: [facility];  Service: Vascular;  Laterality: Right;   MULTIPLE TOOTH EXTRACTIONS     PERIPHERAL VASCULAR BALLOON ANGIOPLASTY Right 09/20/2021   Procedure: PERIPHERAL VASCULAR BALLOON ANGIOPLASTY;  Surgeon: Broadus John, MD;  Location: [place] [facility];  Service: Cardiovascular;  Laterality: Right;  SFA  FAILED   REMOVAL OF A DIALYSIS CATHETER N/A 05/01/2022   Procedure: MINOR REMOVAL OF A TUNNELED DIALYSIS CATHETER;  Surgeon: Rosetta Posner, MD;  Location: [facility];  Service: Vascular;  Laterality: N/A;     Home Medications:  Prior to Admission medications   Medication Sig Start Date End Date Taking? Authorizing Provider  albuterol (VENTOLIN HFA) 108 (90 Base) MCG/ACT inhaler Inhale 2 puffs into the lungs every 4 (four) hours as needed for wheezing or shortness of breath. 10/14/22  Yes Shah, Pratik D, DO  amLODipine (NORVASC) 5 MG tablet Take 2.5 mg by mouth at bedtime. 09/01/22  Yes [provider]  aspirin EC 325 MG tablet Take 325 mg by mouth. Once every 2 days 02/27/22  Yes [provider]   Cholecalciferol 125 MCG (5000 UT) TABS Take by mouth. 02/27/22  Yes [provider]  fluticasone furoate-vilanterol (BREO ELLIPTA) 200-25 MCG/INH AEPB Inhale 1 puff into the lungs daily. 10/14/22  Yes Shah, Pratik D, DO  metoprolol succinate (TOPROL XL) 25 MG 24 hr tablet Take 1 tablet (25 mg total) by mouth daily. 12/20/21  Yes Javante Nilsson, [place], PA-C  Multiple Vitamin (MULTIVITAMIN) tablet Take 1 tablet by mouth daily.   Yes [provider]  predniSONE (DELTASONE) 10 MG tablet Take 4 tablets (40 mg total) by mouth daily for 5 days. 10/14/22 10/19/22 Yes Shah, Pratik D, DO  tamsulosin (FLOMAX) 0.4 MG CAPS capsule Take 0.4 mg by mouth at bedtime. 05/24/20  Yes [provider]  tiotropium (SPIRIVA HANDIHALER) 18 MCG inhalation capsule Place 1 capsule (18 mcg total) into inhaler and inhale daily. 10/14/22  Yes Shah, Pratik D, DO  oxyCODONE-acetaminophen (PERCOCET) 5-325 MG tablet Take 1 tablet by mouth every 6 (six) hours as needed for severe pain. Patient not taking: Reported on 09/12/2022 02/13/22   EarlyArvilla Meres, MD    Inpatient Medications: Scheduled Meds:  amLODipine  2.5 mg Oral QHS   aspirin EC  81 mg Oral Daily   Chlorhexidine Gluconate Cloth  6 each Topical Q0600   fluticasone furoate-vilanterol  1 puff Inhalation Daily   methylPREDNISolone sodium succinate  125 mg Intravenous Once   metoprolol succinate  25 mg Oral Daily   rosuvastatin  40 mg Oral Daily   tamsulosin  0.4 mg Oral QHS   umeclidinium bromide  1 puff Inhalation Daily   Continuous Infusions:  heparin 1,100 Units/hr (10/18/22 0839)   PRN Meds: acetaminophen **OR** acetaminophen, albuterol, heparin, lidocaine (PF), lidocaine-prilocaine, morphine injection, ondansetron **OR** ondansetron (ZOFRAN) IV, oxyCODONE, pentafluoroprop-tetrafluoroeth  Allergies:    Allergies  Allergen Reactions   Strawberry (Diagnostic) Rash    Social History:   Social History   Socioeconomic History   Marital  status: Single    Spouse name: Not on file   Number of children: Not on file   Years of education: Not on file   Highest education level: Not on file  Occupational History   Occupation: retired  Tobacco Use   Smoking status: Every Day    Packs/day: 0.50    Years: 62.00    Total pack years: 31.00    Types: Cigarettes   Smokeless tobacco: Former    Types: Chew   Tobacco comments:    Started smoking at age 70 - rolls his own tobacco cigarettes   Vaping Use   Vaping Use: Never used  Substance and Sexual Activity   Alcohol use: No   Drug use: Yes    Types: Marijuana    Comment: once every 3-4 weeks   Sexual activity: Not Currently    Birth control/protection: None  Other Topics Concern   Not on file  Social History Narrative   Lives alone. No family nearby. He does get out and socialize with friends.    Doesn't have  a good relationship with his children   Social Determinants of Health   Financial Resource Strain: Medium Risk (11/22/2021)   Overall Financial Resource Strain (CARDIA)    Difficulty of Paying Living Expenses: Somewhat hard  Food Insecurity: No Food Insecurity (10/13/2022)   Hunger Vital Sign    Worried About Running Out of Food in the Last Year: Never true    Ran Out of Food in the Last Year: Never true  Transportation Needs: No Transportation Needs (10/13/2022)   PRAPARE - Hydrologist (Medical): No    Lack of Transportation (Non-Medical): No  Physical Activity: Sufficiently Active (11/22/2021)   Exercise Vital Sign    Days of Exercise per Week: 5 days    Minutes of Exercise per Session: 30 min  Stress: No Stress Concern Present (11/22/2021)   [place]    Feeling of Stress : Not at all  Social Connections: Socially Isolated (11/22/2021)   Social Connection and Isolation Panel [NHANES]    Frequency of Communication with Friends and Family: More than three times  a week    Frequency of Social Gatherings with Friends and Family: More than three times a week    Attends Religious Services: Never    Marine scientist or Organizations: No    Attends Archivist Meetings: Never    Marital Status: Never married  Intimate Partner Violence: Not At Risk (10/13/2022)   Humiliation, Afraid, Rape, and Kick questionnaire    Fear of Current or Ex-Partner: No    Emotionally Abused: No  Physically Abused: No    Sexually Abused: No    Family History:    Family History  Problem Relation Age of Onset   Alzheimer's disease Mother      ROS:  Please see the history of present illness.   All other ROS reviewed and negative.     Physical Exam/Data:   Vitals:   10/18/22 0715 10/18/22 0730 10/18/22 0800 10/18/22 0830  BP: 123/65 138/72 125/79 134/72  Pulse: 77 73 71 82  Resp: 20 (!) 21 19 (!) 24  Temp:      SpO2: 100% 100% 100% 100%  Weight:      Height:       No intake or output data in the 24 hours ending 10/18/22 0914    10/17/2022    6:59 PM 10/14/2022    4:36 AM 10/13/2022    6:23 PM  Last 3 Weights  Weight (lbs) 188 lb 7.9 oz 189 lb 2.5 oz 179 lb 14.3 oz  Weight (kg) 85.5 kg 85.8 kg 81.6 kg     Body mass index is 31.37 kg/m.  General:  Pleasant elderly male appearing in no acute distress. HEENT: normal Neck: no JVD Vascular: No carotid bruits; Distal pulses 2+ bilaterally Cardiac:  normal S1, S2; RRR; 2/6 SEM along RUSB.  Lungs: Expiratory wheezing along upper lung fields bilaterally.  Abd: soft, nontender, no hepatomegaly  Ext: no pitting edema Musculoskeletal:  No deformities, BUE and BLE strength normal and equal Skin: warm and dry  Neuro:  CNs 2-12 intact, no focal abnormalities noted Psych:  Normal affect   EKG:  The EKG was personally reviewed and demonstrates:  Sinus tachycardia, HR 125 with PVC's and slight ST depression along lateral leads.   Relevant CV Studies:  Echocardiogram: 10/13/2022 IMPRESSIONS      1. Left ventricular ejection fraction, by estimation, is 70 to 75%. The  left ventricle has hyperdynamic function. The left ventricle has no  regional wall motion abnormalities. There is mild left ventricular  hypertrophy. Left ventricular diastolic  parameters are indeterminate.   2. Right ventricular systolic function is normal. The right ventricular  size is normal. There is moderately elevated pulmonary artery systolic  pressure. The estimated right ventricular systolic pressure is 25.8 mmHg.   3. Left atrial size was moderately dilated.   4. Right atrial size was mildly dilated.   5. The mitral valve is degenerative. Mild mitral valve regurgitation.  Moderate mitral stenosis. The mean mitral valve gradient is 9.5 mmHg with  average heart rate of 82 bpm. Severe mitral annular calcification. DVI  0.52, in setting of hyperdynamic  ventricle.   6. The aortic valve is abnormal. There is moderate calcification of the  aortic valve. There is moderate thickening of the aortic valve. Aortic  valve regurgitation is not visualized. Moderate to severe aortic valve  stenosis. Aortic valve mean gradient  measures 35.5 mmHg. Aortic valve Vmax measures 3.84 m/s.   7. The inferior vena cava is normal in size with <50% respiratory  variability, suggesting right atrial pressure of 8 mmHg.    Laboratory Data:  High Sensitivity Troponin:   Recent Labs  Lab 10/17/22 1912 10/17/22 2104 10/17/22 2344 10/18/22 0318  TROPONINIHS 226* 1,286* 3,927* 6,053*     Chemistry Recent Labs  Lab 10/14/22 0355 10/17/22 1912 10/18/22 0318  NA 136 133* 130*  K 4.1 5.1 4.7  CL 96* 91* 92*  CO2 27 25 24   GLUCOSE 110* 230* 173*  BUN 29* 62* 68*  CREATININE 6.55* 8.75* 9.06*  CALCIUM 8.6* 8.3* 8.2*  MG 1.8  --  2.0  GFRNONAA 8* 6* 6*  ANIONGAP 13 17* 14    Recent Labs  Lab 10/17/22 1912 10/18/22 0318  PROT 7.9 7.0  ALBUMIN 3.4* 3.0*  AST 40 47*  ALT 19 16  ALKPHOS 59 51  BILITOT 0.6 0.4    Lipids No results for input(s): "CHOL", "TRIG", "HDL", "LABVLDL", "LDLCALC", "CHOLHDL" in the last 168 hours.  Hematology Recent Labs  Lab 10/14/22 0355 10/17/22 1912 10/18/22 0318  WBC 7.4 13.0* 7.3  RBC 2.83* 3.51* 2.89*  HGB 9.5* 11.7* 9.6*  HCT 28.1* 34.6* 28.6*  MCV 99.3 98.6 99.0  MCH 33.6 33.3 33.2  MCHC 33.8 33.8 33.6  RDW 14.1 14.7 14.7  PLT 236 377 186   Thyroid No results for input(s): "TSH", "FREET4" in the last 168 hours.  BNP Recent Labs  Lab 10/13/22 0805 10/17/22 1912  BNP 2,290.0* 1,821.0*    DDimer  Recent Labs  Lab 10/18/22 0318  DDIMER 1.12*     Radiology/Studies:  DG Chest Portable 1 View  Result Date: 10/17/2022 CLINICAL DATA:  Shortness of breath. EXAM: PORTABLE CHEST 1 VIEW COMPARISON:  Chest radiograph dated 10/13/2022. FINDINGS: Diffuse interstitial prominence and Kerley B-lines consistent with edema. Mild blunting of the costophrenic angles, new or progressed since the prior radiograph and may represent small pleural effusions. No consolidative changes. No pneumothorax. The cardiac silhouette is within normal limits. Atherosclerotic calcification of the aorta. No acute osseous pathology. IMPRESSION: Relatively similar appearance of interstitial edema. Electronically Signed   By: Anner Crete M.D.   On: 10/17/2022 19:55     Assessment and Plan:   1. NSTEMI - Presented with acutely worsening dyspnea starting yesterday but denies any associated chest pain. Found to have an NSTEMI with most recent Hs Troponin up to 6053. EKG shows slight ST depression along the lateral leads. Will obtain a limited echocardiogram for reassessment of his EF and wall motion. - Findings were reviewed with the patient and Dr. Dellia Cloud did review risks and benefits of a cardiac catheterization with him. The patient understands that risks include but are not limited to stroke (1 in 1000), death (1 in 20), kidney failure [usually temporary] (1 in 500), bleeding (1  in 200), allergic reaction [possibly serious] (1 in 200). While he is currently DNR, he does agree to rescind this during his procedure. - Continue IV Heparin and ASA but will update the order to 81 mg daily instead of ASA 325 mg every 48 hours as ordered on admission. He has been continued on PTA Toprol-XL 25 mg daily and will add Crestor 40 mg daily. Discussed with the Hospitalist and Nephrology and anticipate cardiac catheterization later today following his morning dialysis session.  2. PAD - He is s/p right femoral endarterectomy and fem-pop bypass in 09/2021. Followed by Vascular Surgery and he has also been more recently followed by them for steal symptoms along his right hand. -Continue ASA 81 mg daily and will restart statin therapy as outlined above.  3. HLD - He was previously on Atorvastatin 40 mg daily but this is no longer listed on his medication list. We will restart high-intensity statin therapy and recheck an FLP.  4. Aortic Stenosis -This was moderate to severe by most recent echocardiogram earlier this month. Pending work-up this admission, would consider Structural Heart Team evaluation.   5. ESRD - Nephrology following and planning for HD today.   6. Acute Hypoxic  Respiratory Failure/COPD - Initially required BiPAP but weaned to nasal cannula this morning. Continue with inhalers and scheduled nebulizers. Management per the admitting team.     Risk Assessment/Risk Scores:     TIMI Risk Score for Unstable Angina or Non-ST Elevation MI:   The patient's TIMI risk score is 4, which indicates a 20% risk of all cause mortality, new or recurrent myocardial infarction or need for urgent revascularization in the next 14 days.  For questions or updates, please contact [place] Please consult [email or web address] for contact info under    Signed, Erma Heritage, PA-C  10/18/2022 9:14 AM

## 2022-10-19 ENCOUNTER — Encounter (HOSPITAL_COMMUNITY): Payer: Self-pay | Admitting: Cardiology

## 2022-10-19 ENCOUNTER — Inpatient Hospital Stay (HOSPITAL_COMMUNITY): Payer: 59

## 2022-10-19 DIAGNOSIS — I350 Nonrheumatic aortic (valve) stenosis: Secondary | ICD-10-CM

## 2022-10-19 DIAGNOSIS — I2510 Atherosclerotic heart disease of native coronary artery without angina pectoris: Secondary | ICD-10-CM

## 2022-10-19 DIAGNOSIS — I351 Nonrheumatic aortic (valve) insufficiency: Secondary | ICD-10-CM

## 2022-10-19 LAB — MRSA NEXT GEN BY PCR, NASAL: MRSA by PCR Next Gen: NOT DETECTED

## 2022-10-19 LAB — HEPARIN LEVEL (UNFRACTIONATED)
Heparin Unfractionated: 0.16 IU/mL — ABNORMAL LOW (ref 0.30–0.70)
Heparin Unfractionated: 0.53 IU/mL (ref 0.30–0.70)

## 2022-10-19 LAB — LIPID PANEL
Cholesterol: 132 mg/dL (ref 0–200)
HDL: 37 mg/dL — ABNORMAL LOW (ref >40)
LDL Cholesterol: 77 mg/dL (ref 0–99)
Total CHOL/HDL Ratio: 3.6 RATIO
Triglycerides: 88 mg/dL (ref <150)
VLDL: 18 mg/dL (ref 0–40)

## 2022-10-19 LAB — RENAL FUNCTION PANEL
Albumin: 2.9 g/dL — ABNORMAL LOW (ref 3.5–5.0)
Anion gap: 12 (ref 5–15)
BUN: 36 mg/dL — ABNORMAL HIGH (ref 8–23)
CO2: 28 mmol/L (ref 22–32)
Calcium: 8.4 mg/dL — ABNORMAL LOW (ref 8.9–10.3)
Chloride: 94 mmol/L — ABNORMAL LOW (ref 98–111)
Creatinine, Ser: 5.37 mg/dL — ABNORMAL HIGH (ref 0.61–1.24)
GFR, Estimated: 11 mL/min — ABNORMAL LOW (ref >60)
Glucose, Bld: 98 mg/dL (ref 70–99)
Phosphorus: 5.8 mg/dL — ABNORMAL HIGH (ref 2.5–4.6)
Potassium: 4.1 mmol/L (ref 3.5–5.1)
Sodium: 134 mmol/L — ABNORMAL LOW (ref 135–145)

## 2022-10-19 LAB — CBC
HCT: 27.9 % — ABNORMAL LOW (ref 39.0–52.0)
Hemoglobin: 9.2 g/dL — ABNORMAL LOW (ref 13.0–17.0)
MCH: 33 pg (ref 26.0–34.0)
MCHC: 33 g/dL (ref 30.0–36.0)
MCV: 100 fL (ref 80.0–100.0)
Platelets: 209 10*3/uL (ref 150–400)
RBC: 2.79 MIL/uL — ABNORMAL LOW (ref 4.22–5.81)
RDW: 14.9 % (ref 11.5–15.5)
WBC: 8.5 10*3/uL (ref 4.0–10.5)
nRBC: 0 % (ref 0.0–0.2)

## 2022-10-19 LAB — HEMOGLOBIN A1C
Hgb A1c MFr Bld: 5.4 % (ref 4.8–5.6)
Mean Plasma Glucose: 108.28 mg/dL

## 2022-10-19 LAB — HEPATITIS B SURFACE ANTIBODY, QUANTITATIVE: Hep B S AB Quant (Post): 9.2 m[IU]/mL — ABNORMAL LOW (ref >9.9)

## 2022-10-19 MED ORDER — IOHEXOL 350 MG/ML SOLN
95.0000 mL | Freq: Once | INTRAVENOUS | Status: AC | PRN
  Administered 2022-10-19: 95 mL via INTRAVENOUS

## 2022-10-19 MED ORDER — ATORVASTATIN CALCIUM 80 MG PO TABS
80.0000 mg | ORAL_TABLET | Freq: Every day | ORAL | Status: DC
  Administered 2022-10-19 – 2022-10-24 (×5): 80 mg via ORAL
  Filled 2022-10-19 (×5): qty 1

## 2022-10-19 NOTE — Progress Notes (Signed)
Received patient in bed to unit.  Alert and oriented.  Informed consent signed and in chart.   Treatment initiated: 3437 Treatment completed: 1700  Patient tolerated well. Pt did end tx 30 min early.  Transported back to the room  Alert, without acute distress.  Hand-off given to patient's nurse.   Access used: Avfistula  Access issues: none  Total UF removed: 2L Medication(s) given: none Post HD VS: 127/55,70,72,21,97.9 Post HD weight: 78.4kg   [facility]

## 2022-10-19 NOTE — Progress Notes (Signed)
Subjective: Seen in room status more comfortable today after extra dialysis yesterday for volume overload.  Today dialysis normal schedule for him pending.  Stated he had shortness of breath after walking in hall  earlier today.  Resting in bed currently no chest pain or shortness of breath  Objective Vital signs in last 24 hours: Vitals:   10/18/22 2333 10/18/22 2354 10/19/22 0412 10/19/22 0816  BP: 120/74  132/77 118/69  Pulse: 88  87 77  Resp: 16  20 18   Temp: 98.4 F (36.9 C) 98.4 F (36.9 C) 97.7 F (36.5 C) 98.4 F (36.9 C)  TempSrc: Oral  Oral Oral  SpO2: 94%  98% 96%  Weight:   79.6 kg   Height:       Weight change: -5.939 kg  Physical Exam: General: Thin elderly male chronically ill-appearing no acute distress Heart: RRR, 2/6 SEM, no rub or gallop Lungs: Decreased breath sound bases and right sided rales soft / nasal cannula oxygen /nonlabored breathing Abdomen: NABS, soft NTND no ascites Extremities: No significant pedal edema  dialysis Access: RUA AVF+ bruit     OP HD= [facility] TTS , 4 hr 56min,  UF P #4. EDW 79.5 kg 2K 2.5 CA, 180 dialyzer , Heparin 5000 units Mircera 30 mcg every 4 weeks but not given recently because of hemoglobins 12, 10.1, 10.2 Calcitriol 0.25 p.o. on HD Access RUA AVF   Stat  AM chest x-ray 11/10 /23 =-Unchanged CHF pattern.    Problem/Plan: SOB/DOE with pulmonary edema, three-vessel CAD, AOS / HO COPD =  Has HD 2.5 L uf  at [facility] on admit  11/09 ,,then yest 10/19/22 short of breath with continued chest x-ray showing CHF 2 L UF HD , plan again HD  today to keep on TTS schedule  ESRD -HD TTS, as above HD today  NSTEMI / Card Cath = Nl LVEF,severe three-vessel obstructive CAD, Sev AS= Card. following / consulting CT surgery.  Thought to be high risk for surgery, Dr. Darcey Nora  Severe aortic stenosis= Card following, CT surgery, Dr. Darcey Nora adjusted possible TAVR History of COPD= tobacco abuse currently, Mr. Kresse thinks he  "may stop smoking now" Anemia of CKD-Hgb 9.2, restart today 40 mcg Aranesp continue on schedule Aranesp weekly in hospital Secondary hyperparathyroidism - Corec  CA 9.3 .continue calcitriol/ phos 6.5 , on Vel phoro as binder   Ernest Haber, PA-C [facility] [phone] 10/19/2022,9:48 AM  LOS: 2 days   Labs: Basic Metabolic Panel: Recent Labs  Lab 10/17/22 1912 10/18/22 0318 10/18/22 1646 10/18/22 1650 10/19/22 0152  NA 133* 130* 136  135 136 134*  K 5.1 4.7 3.7  3.7 3.6 4.1  CL 91* 92*  --   --  94*  CO2 25 24  --   --  28  GLUCOSE 230* 173*  --   --  98  BUN 62* 68*  --   --  36*  CREATININE 8.75* 9.06*  --   --  5.37*  CALCIUM 8.3* 8.2*  --   --  8.4*  PHOS  --  7.4*  --   --  5.8*   Liver Function Tests: Recent Labs  Lab 10/17/22 1912 10/18/22 0318 10/19/22 0152  AST 40 47*  --   ALT 19 16  --   ALKPHOS 59 51  --   BILITOT 0.6 0.4  --   PROT 7.9 7.0  --   ALBUMIN 3.4* 3.0* 2.9*   No results  for input(s): "LIPASE", "AMYLASE" in the last 168 hours. No results for input(s): "AMMONIA" in the last 168 hours. CBC: Recent Labs  Lab 10/13/22 0805 10/14/22 0355 10/17/22 1912 10/18/22 0318 10/18/22 1646 10/18/22 1650  WBC 11.5* 7.4 13.0* 7.3  --   --   NEUTROABS 10.2*  --  11.5*  --   --   --   HGB 11.1* 9.5* 11.7* 9.6* 9.2*  8.8* 8.8*  HCT 33.4* 28.1* 34.6* 28.6* 27.0*  26.0* 26.0*  MCV 99.1 99.3 98.6 99.0  --   --   PLT 235 236 377 186  --   --    Cardiac Enzymes: No results for input(s): "CKTOTAL", "CKMB", "CKMBINDEX", "TROPONINI" in the last 168 hours. CBG: No results for input(s): "GLUCAP" in the last 168 hours.   Medications:  sodium chloride     heparin 1,350 Units/hr (10/19/22 0401)    amLODipine  2.5 mg Oral QHS   aspirin EC  81 mg Oral Daily   atorvastatin  80 mg Oral Daily   calcitRIOL  0.25 mcg Oral Q T,Th,Sa-HD   fluticasone furoate-vilanterol  1 puff Inhalation Daily   metoprolol succinate  25 mg Oral Daily    sodium chloride flush  3 mL Intravenous Q12H   sucroferric oxyhydroxide  500 mg Oral TID WC   tamsulosin  0.4 mg Oral QHS   umeclidinium bromide  1 puff Inhalation Daily

## 2022-10-19 NOTE — Plan of Care (Signed)
  Problem: Education: Goal: Ability to demonstrate management of disease process will improve Outcome: Progressing Goal: Ability to verbalize understanding of medication therapies will improve Outcome: Progressing Goal: Individualized Educational Video(s) Outcome: Progressing   Problem: Activity: Goal: Capacity to carry out activities will improve Outcome: Progressing   Problem: Cardiac: Goal: Ability to achieve and maintain adequate cardiopulmonary perfusion will improve Outcome: Progressing   Problem: Education: Goal: Knowledge of General Education information will improve Description: Including pain rating scale, medication(s)/side effects and non-pharmacologic comfort measures Outcome: Progressing   Problem: Health Behavior/Discharge Planning: Goal: Ability to manage health-related needs will improve Outcome: Progressing   Problem: Clinical Measurements: Goal: Ability to maintain clinical measurements within normal limits will improve Outcome: Progressing Goal: Will remain free from infection Outcome: Progressing Goal: Diagnostic test results will improve Outcome: Progressing Goal: Respiratory complications will improve Outcome: Progressing Goal: Cardiovascular complication will be avoided Outcome: Progressing   Problem: Activity: Goal: Risk for activity intolerance will decrease Outcome: Progressing   Problem: Nutrition: Goal: Adequate nutrition will be maintained Outcome: Progressing   Problem: Coping: Goal: Level of anxiety will decrease Outcome: Progressing   Problem: Elimination: Goal: Will not experience complications related to bowel motility Outcome: Progressing Goal: Will not experience complications related to urinary retention Outcome: Progressing   Problem: Pain Managment: Goal: General experience of comfort will improve Outcome: Progressing   Problem: Safety: Goal: Ability to remain free from injury will improve Outcome: Progressing    Problem: Skin Integrity: Goal: Risk for impaired skin integrity will decrease Outcome: Progressing   Problem: Education: Goal: Understanding of CV disease, CV risk reduction, and recovery process will improve Outcome: Progressing Goal: Individualized Educational Video(s) Outcome: Progressing   Problem: Activity: Goal: Ability to return to baseline activity level will improve Outcome: Progressing   Problem: Cardiovascular: Goal: Ability to achieve and maintain adequate cardiovascular perfusion will improve Outcome: Progressing Goal: Vascular access site(s) Level 0-1 will be maintained Outcome: Progressing   Problem: Health Behavior/Discharge Planning: Goal: Ability to safely manage health-related needs after discharge will improve Outcome: Progressing   Problem: Education: Goal: Knowledge of cardiac device and self-care will improve Outcome: Progressing Goal: Ability to safely manage health related needs after discharge will improve Outcome: Progressing Goal: Individualized Educational Video(s) Outcome: Progressing   Problem: Cardiac: Goal: Ability to achieve and maintain adequate cardiopulmonary perfusion will improve Outcome: Progressing

## 2022-10-19 NOTE — Progress Notes (Signed)
[facility] PROGRESS NOTE  Kerry Underwood (DOB: April 02, 1951) UXN:[id number] PCP: [name], FNP   Brief Narrative: Kerry Underwood is a 71 y.o. male with a history of ESRD, COPD, CVA, HTN, and recent admission for acute on chronic HFpEF who presented to the ED with at [facility] on 10/17/2022 with worsening exertional dyspnea, also episode of presyncope found to have wheezing, pulmonary edema and NSTEMI with troponins rising precipitously. He was transferred to [facility] for cardiac catheterization which showed severe multivessel CAD for which CT surgery was consulted. Also found to have severe aortic stenosis, felt to be a significant driver of symptoms. His COPD and ESRD appear to present prohibitive risk for CABG, though work up for AVR is underway. He continues to have respiratory distress, undergoing additional UF with HD for continued pulmonary edema, getting nebulized therapies for possibility of superimposed COPD exacerbation.  Subjective: Goes in and out of feeling very short of breath, denies chest pain. Denies wheezing. No peg swelling currently. Makes "a lot" of urine.  Objective: BP (!) 141/78   Pulse 89   Temp 98.1 F (36.7 C) (Oral)   Resp 18   Ht 5\' 5"  (1.651 m)   Wt 80.3 kg   SpO2 94%   BMI 29.46 kg/m   Gen: Chronically ill-appearing, pleasant male in no distress Pulm: Crackles at bases, hyperexpanded by exam, diminished elsewhere without wheezes.  CV: Regular with frequent bigeminy/PVCs. III/VI RUSB systolic murmur. No pitting dependent edema GI: Soft, NT, ND, +BS  Neuro: Alert and oriented. No new focal deficits. Ext: Warm, no deformities Skin: No acute rashes, lesions or ulcers on visualized skin   Assessment & Plan: Acute hypoxic respiratory failure: Due to acute on chronic HFpEF/pulmonary edema. Baseline COPD contributing, though not clearly in severe exacerbation. Given respiratory distress out of proportion to crackles on exam, suspect there is cardiac component.  -  Continue supplemental oxygen to maintain SpO2 >89%, can use BiPAP prn for WOB as well. Some chronic CO2 retention noted by blood gas with normal pH.  - D-dimer modestly elevated. Though there are plenty of explanations for his presentation, CTA (as part of valve evaluation) should sufficiently evaluate for PE.  - CXR ordered this AM, continues to show pulmonary edema. Note also elevated LV filling pressures at cath. Appreciate nephrology assistance with UF. Note patient reports some significant improvement after HD previously.  - Monitor I/O, weights closely - Continue bronchodilators, avoid steroids unless wheezing develops.  - ?if anginal equivalent and/or if full constellation of symptoms is primarily attributable to AS. Could try NTG.  Multivessel CAD, NSTEMI, HLD: Said not to have good PCI targets and cardiothoracic surgery evaluation stated he is at prohibitive risk for CABG.  - Continue heparin gtt, ASA - Continue statin, changing to atorvastatin given ESRD. LDL is 77. HbA1c is 5.4%.  - Continue metoprolol 25mg  daily, norvasc 2.5mg  daily. - prn NTG.  Ventricular bigeminy: Noted on personal review of telemetry and seen on monitor during encounter.  - Defer rhythm management to cardiology, continuing cardioselective beta blocker.  - Last TSH was normal, update ordered with AM labs.   ESRD: Note patient still makes urine. Contrast with CTAs present a nonzero risk, though this is in pursuit of work up for very important procedure.  - HD per nephrology. Really appreciate their assistance, plan to return to typical TTS schedule 11/11 as well.   Anemia of ESRD:  - Stable  PAD: s/p right femoral endarterectomy and fem-pop bypass Oct 2022, quiescent currently.  -  Antiplatelet, statin  Patrecia Pour, MD [facility] [email or web address] 10/19/2022, 2:59 PM

## 2022-10-19 NOTE — Progress Notes (Signed)
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  Allergies  Allergen Reactions   Strawberry (Diagnostic) Rash    Patient Measurements: Height: 5\' 5"  (165.1 cm) Weight: 79.6 kg (175 lb 6.4 oz) IBW/kg (Calculated) : 61.5 Heparin Dosing Weight: 80kg  Vital Signs: Temp: 98.4 F (36.9 C) (11/10 0816) Temp Source: Oral (11/10 0816) BP: 118/69 (11/10 0816) Pulse Rate: 77 (11/10 0816)  Labs: Recent Labs    10/17/22 1912 10/17/22 2104 10/17/22 2344 10/18/22 0318 10/18/22 0941 10/18/22 1646 10/18/22 1650 10/19/22 0152  HGB 11.7*  --   --  9.6*  --  9.2*  8.8* 8.8*  --   HCT 34.6*  --   --  28.6*  --  27.0*  26.0* 26.0*  --   PLT 377  --   --  186  --   --   --   --   HEPARINUNFRC  --   --   --   --  0.19*  --   --   --   CREATININE 8.75*  --   --  9.06*  --   --   --  5.37*  TROPONINIHS 226* 1,286* 3,927* 6,053*  --   --   --   --      Estimated Creatinine Clearance: 11.9 mL/min (A) (by C-G formula based on SCr of 5.37 mg/dL (H)).  Assessment: 71yo male presents w/ respiratory distress, initial troponin elevated and rising, started on IV heparin for ACS.  Pt s/p LHC with mvCAD, planning surgical consult. Heparin level is subtherapeutic at 0.17, CBC stable.  Goal of Therapy:  Heparin level 0.3-0.7 units/ml Monitor platelets by anticoagulation protocol: Yes   Plan:  Increase heparin to 1600 units/h Recheck heparin level in 8h  Arrie Senate, PharmD, [place], [facility] Clinical Pharmacist [phone] Please check AMION for all [place] numbers 10/19/2022

## 2022-10-19 NOTE — Progress Notes (Signed)
Transport called CT to advise that RN doesn't want to come to CT w/ pt on heparin drip because of shift change.

## 2022-10-19 NOTE — Progress Notes (Signed)
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  Allergies  Allergen Reactions   Strawberry (Diagnostic) Rash    Patient Measurements: Height: 5\' 5"  (165.1 cm) Weight: 78.4 kg (172 lb 13.5 oz) IBW/kg (Calculated) : 61.5 Heparin Dosing Weight: 80kg  Vital Signs: Temp: 98.1 F (36.7 C) (11/10 1428) Temp Source: Oral (11/10 1428) BP: 127/55 (11/10 1700) Pulse Rate: 87 (11/10 1452)  Labs: Recent Labs    10/17/22 1912 10/17/22 2104 10/17/22 2344 10/18/22 0318 10/18/22 0941 10/18/22 1646 10/18/22 1650 10/19/22 0152 10/19/22 0940 10/19/22 1813  HGB 11.7*  --   --  9.6*  --  9.2*  8.8* 8.8*  --  9.2*  --   HCT 34.6*  --   --  28.6*  --  27.0*  26.0* 26.0*  --  27.9*  --   PLT 377  --   --  186  --   --   --   --  209  --   HEPARINUNFRC  --   --   --   --  0.19*  --   --   --  0.16* 0.53  CREATININE 8.75*  --   --  9.06*  --   --   --  5.37*  --   --   TROPONINIHS 226* 1,286* 3,927* 6,053*  --   --   --   --   --   --      Estimated Creatinine Clearance: 11.8 mL/min (A) (by C-G formula based on SCr of 5.37 mg/dL (H)).  Assessment: 71yo male presents w/ respiratory distress, initial troponin elevated and rising, started on IV heparin for ACS.  Pt s/p LHC with mvCAD, planning surgical consult. Heparin level is therapeutic at 0.53 units/mL; no bleeding reported.  Goal of Therapy:  Heparin level 0.3-0.7 units/ml Monitor platelets by anticoagulation protocol: Yes   Plan:  Continue heparin gtt at 1600 units/hr F/U AM labs  Krishana Lutze D. Mina Marble, PharmD, BCPS, [place] 10/19/2022, 7:32 PM

## 2022-10-19 NOTE — Progress Notes (Addendum)
Progress Note  Patient Name: Kerry Underwood Date of Encounter: 10/19/2022  Primary Cardiologist: Freada Bergeron, MD  Subjective   Developed increased WOB this morning with crackles, increased O2 requirement. Saw improvement with nebs and O2 but still feels SOB with minimal activity, no CP.   Inpatient Medications    Scheduled Meds:  amLODipine  2.5 mg Oral QHS   aspirin EC  81 mg Oral Daily   calcitRIOL  0.25 mcg Oral Q T,Th,Sa-HD   fluticasone furoate-vilanterol  1 puff Inhalation Daily   methylPREDNISolone sodium succinate  125 mg Intravenous Once   metoprolol succinate  25 mg Oral Daily   rosuvastatin  40 mg Oral Daily   sodium chloride flush  3 mL Intravenous Q12H   sucroferric oxyhydroxide  500 mg Oral TID WC   tamsulosin  0.4 mg Oral QHS   umeclidinium bromide  1 puff Inhalation Daily   Continuous Infusions:  sodium chloride     heparin 1,350 Units/hr (10/19/22 0401)   PRN Meds: sodium chloride, acetaminophen **OR** acetaminophen, albuterol, heparin, lidocaine (PF), lidocaine-prilocaine, morphine injection, ondansetron **OR** ondansetron (ZOFRAN) IV, oxyCODONE, pentafluoroprop-tetrafluoroeth, sodium chloride flush   Vital Signs    Vitals:   10/18/22 2333 10/18/22 2354 10/19/22 0412 10/19/22 0816  BP: 120/74  132/77 118/69  Pulse: 88  87 77  Resp: _0 Temp: 98.4 F (36.9 C) 98.4 F (36.9 C) 97.7 F (36.5 C) 98.4 F (36.9 C)  TempSrc: Oral  Oral Oral  SpO2: 94%  98% 96%  Weight:   79.6 kg   Height:        Intake/Output Summary (Last 24 hours) at 10/19/2022 0923 Last data filed at 10/18/2022 1340 Gross per 24 hour  Intake --  Output 2800 ml  Net -2800 ml      10/19/2022    4:12 AM 10/17/2022    6:59 PM 10/14/2022    4:36 AM  Last 3 Weights  Weight (lbs) 175 lb 6.4 oz 188 lb 7.9 oz 189 lb 2.5 oz  Weight (kg) 79.561 kg 85.5 kg 85.8 kg     Telemetry    NSR with frequent PVCs, short run of atrial tach versus a-flutter - Personally  Reviewed  ECG    No new tracings - Personally Reviewed  Physical Exam   GEN: No acute distress.  HEENT: Normocephalic, atraumatic, sclera non-icteric. Neck: No JVD or bruits. Cardiac: RRR 2/6 SEM no rubs or gallops.  Respiratory: Limited exam as he was sitting back in bed getting IV placement, but diminished BS with crackles at bases. No wheezing or rhonchi. Minimal increase WOB at rest GI: Soft, nontender, non-distended, BS +x 4. MS: no deformity. Extremities: No clubbing or cyanosis. No edema. Distal pedal pulses are 2+ and equal bilaterally. Neuro:  AAOx3. Follows commands. Psych:  Responds to questions appropriately with a normal affect.  Labs    High Sensitivity Troponin:   Recent Labs  Lab 10/17/22 1912 10/17/22 2104 10/17/22 2344 10/18/22 0318  TROPONINIHS 226* 1,286* 3,927* 6,053*      Cardiac EnzymesNo results for input(s): "TROPONINI" in the last 168 hours. No results for input(s): "TROPIPOC" in the last 168 hours.   Chemistry Recent Labs  Lab 10/17/22 1912 10/18/22 0318 10/18/22 1646 10/18/22 1650 10/19/22 0152  NA 133* 130* 136  135 136 134*  K 5.1 4.7 3.7  3.7 3.6 4.1  CL 91* 92*  --   --  94*  CO2 25 24  --   --  28  GLUCOSE 230* 173*  --   --  98  BUN 62* 68*  --   --  36*  CREATININE 8.75* 9.06*  --   --  5.37*  CALCIUM 8.3* 8.2*  --   --  8.4*  PROT 7.9 7.0  --   --   --   ALBUMIN 3.4* 3.0*  --   --  2.9*  AST 40 47*  --   --   --   ALT 19 16  --   --   --   ALKPHOS 59 51  --   --   --   BILITOT 0.6 0.4  --   --   --   GFRNONAA 6* 6*  --   --  11*  ANIONGAP 17* 14  --   --  12     Hematology Recent Labs  Lab 10/14/22 0355 10/17/22 1912 10/18/22 0318 10/18/22 1646 10/18/22 1650  WBC 7.4 13.0* 7.3  --   --   RBC 2.83* 3.51* 2.89*  --   --   HGB 9.5* 11.7* 9.6* 9.2*  8.8* 8.8*  HCT 28.1* 34.6* 28.6* 27.0*  26.0* 26.0*  MCV 99.3 98.6 99.0  --   --   MCH 33.6 33.3 33.2  --   --   MCHC 33.8 33.8 33.6  --   --   RDW 14.1 14.7  14.7  --   --   PLT 236 377 186  --   --     BNP Recent Labs  Lab 10/13/22 0805 10/17/22 1912  BNP 2,290.0* 1,821.0*     DDimer  Recent Labs  Lab 10/18/22 0318  DDIMER 1.12*     Radiology    CARDIAC CATHETERIZATION  Result Date: 10/18/2022   Dist RCA lesion is 60% stenosed.   Mid LM to Dist LM lesion is 70% stenosed.   Ost LAD to Prox LAD lesion is 80% stenosed.   Prox LAD to Mid LAD lesion is 85% stenosed.   Lat 1st Diag lesion is 90% stenosed.   1st Diag lesion is 70% stenosed.   Ramus lesion is 60% stenosed.   Ost Cx to Prox Cx lesion is 99% stenosed.   LV end diastolic pressure is mildly elevated.   Hemodynamic findings consistent with moderate pulmonary hypertension.   There is severe aortic valve stenosis. Severe 3 vessel obstructive CAD Severe aortic stenosis. Mean gradient 32 mm Hg. AVA 0.9 cm squared with index 0.46 Mildly elevated LV filling pressures. LVEDP 18 mm Hg. PCWP mean 19 mm Hg Moderate pulmonary HTN. PAP 54/21 mean 36 mm Hg Normal cardiac output. By Kathlen Brunswick 7.58 lL/min with index 3.92. by Thermodilution output 3.28 L/min with index 3.28. Plan; will consult CT surgery. I don't think there are any PCI options with severely calcified vessels, diffuse disease and trifurcation disease in LAD/diagonal distribution. Will review options  with heart team approach.   ECHOCARDIOGRAM LIMITED  Result Date: 10/18/2022    ECHOCARDIOGRAM LIMITED REPORT   Patient Name:   Kerry Underwood Date of Exam: 10/18/2022 Medical Rec #:  [id number]    Height:       65.0 in Accession #:    [id number]   Weight:       188.5 lb Date of Birth:  31-Oct-1951    BSA:          1.929 m Patient Age:    71 years     BP:  125/63 mmHg Patient Gender: M            HR:           76 bpm. Exam Location:  [facility] Procedure: Limited Echo Indications:    NSTEMI  History:        Patient has prior history of Echocardiogram examinations, most                 recent 10/13/2022. CHF, Acute MI, COPD; Risk                  Factors:Hypertension.  Sonographer:    Wenda Low Referring Phys: [id number] [place]  1. Limited Echo to assess change in LVEF. Doppler was not performed.  2. Left ventricular ejection fraction, by estimation, is 70 to 75%. The left ventricle has hyperdynamic function. The left ventricle has no regional wall motion abnormalities. There is mild left ventricular hypertrophy.  3. Severe mitral annular calcification.  4. The inferior vena cava is normal in size with greater than 50% respiratory variability, suggesting right atrial pressure of 3 mmHg.Limited FINDINGS  Left Ventricle: Left ventricular ejection fraction, by estimation, is 70 to 75%. The left ventricle has hyperdynamic function. The left ventricle has no regional wall motion abnormalities. There is mild left ventricular hypertrophy. Mitral Valve: Severe mitral annular calcification. Tricuspid Valve: The tricuspid valve is not well visualized. Aortic Valve: The aortic valve was not well visualized. Pulmonic Valve: The pulmonic valve was not well visualized. Venous: The inferior vena cava is normal in size with greater than 50% respiratory variability, suggesting right atrial pressure of 3 mmHg. LEFT VENTRICLE PLAX 2D LVIDd:         4.60 cm LVIDs:         2.80 cm LV PW:         1.30 cm LV IVS:        1.30 cm  LEFT ATRIUM         Index LA diam:    4.60 cm 2.38 cm/m Vishnu Priya Mallipeddi Electronically signed by Lorelee Cover Mallipeddi Signature Date/Time: 10/18/2022/11:37:44 AM    Final    DG Chest Portable 1 View  Result Date: 10/17/2022 CLINICAL DATA:  Shortness of breath. EXAM: PORTABLE CHEST 1 VIEW COMPARISON:  Chest radiograph dated 10/13/2022. FINDINGS: Diffuse interstitial prominence and Kerley B-lines consistent with edema. Mild blunting of the costophrenic angles, new or progressed since the prior radiograph and may represent small pleural effusions. No consolidative changes. No pneumothorax. The cardiac silhouette is  within normal limits. Atherosclerotic calcification of the aorta. No acute osseous pathology. IMPRESSION: Relatively similar appearance of interstitial edema. Electronically Signed   By: Anner Crete M.D.   On: 10/17/2022 19:55    Cardiac Studies   Cath 10/18/22   Dist RCA lesion is 60% stenosed.   Mid LM to Dist LM lesion is 70% stenosed.   Ost LAD to Prox LAD lesion is 80% stenosed.   Prox LAD to Mid LAD lesion is 85% stenosed.   Lat 1st Diag lesion is 90% stenosed.   1st Diag lesion is 70% stenosed.   Ramus lesion is 60% stenosed.   Ost Cx to Prox Cx lesion is 99% stenosed.   LV end diastolic pressure is mildly elevated.   Hemodynamic findings consistent with moderate pulmonary hypertension.   There is severe aortic valve stenosis.   Severe 3 vessel obstructive CAD Severe aortic stenosis. Mean gradient 32 mm Hg. AVA 0.9 cm squared with index 0.46  Mildly elevated LV filling pressures. LVEDP 18 mm Hg. PCWP mean 19 mm Hg Moderate pulmonary HTN. PAP 54/21 mean 36 mm Hg Normal cardiac output. By Kathlen Brunswick 7.58 lL/min with index 3.92. by Thermodilution output 3.28 L/min with index 3.28.    Plan; will consult CT surgery. I don't think there are any PCI options with severely calcified vessels, diffuse disease and trifurcation disease in LAD/diagonal distribution. Will review options  with heart team approach.   2D echo 10/18/22  1. Limited Echo to assess change in LVEF. Doppler was not performed.   2. Left ventricular ejection fraction, by estimation, is 70 to 75%. The  left ventricle has hyperdynamic function. The left ventricle has no  regional wall motion abnormalities. There is mild left ventricular  hypertrophy.   3. Severe mitral annular calcification.   4. The inferior vena cava is normal in size with greater than 50%  respiratory variability, suggesting right atrial pressure of 3  mmHg.Limited   2D echo 10/13/22  1. Left ventricular ejection fraction, by estimation, is 70 to 75%.  The  left ventricle has hyperdynamic function. The left ventricle has no  regional wall motion abnormalities. There is mild left ventricular  hypertrophy. Left ventricular diastolic  parameters are indeterminate.   2. Right ventricular systolic function is normal. The right ventricular  size is normal. There is moderately elevated pulmonary artery systolic  pressure. The estimated right ventricular systolic pressure is 63.8 mmHg.   3. Left atrial size was moderately dilated.   4. Right atrial size was mildly dilated.   5. The mitral valve is degenerative. Mild mitral valve regurgitation.  Moderate mitral stenosis. The mean mitral valve gradient is 9.5 mmHg with  average heart rate of 82 bpm. Severe mitral annular calcification. DVI  0.52, in setting of hyperdynamic  ventricle.   6. The aortic valve is abnormal. There is moderate calcification of the  aortic valve. There is moderate thickening of the aortic valve. Aortic  valve regurgitation is not visualized. Moderate to severe aortic valve  stenosis. Aortic valve mean gradient  measures 35.5 mmHg. Aortic valve Vmax measures 3.84 m/s.   7. The inferior vena cava is normal in size with <50% respiratory  variability, suggesting right atrial pressure of 8 mmHg.    Patient Profile     72 y.o. male with PAD (s/p right femoral endarterectomy and fem-pop bypass in 09/2021), HTN, COPD, ESRD, moderate AS/MS by prior echo, NSTEMI 12/2021 (during COPD/flu A admit; patient declined further testing). Admitted to [facility] 11/4-11/5 for  acute hypoxic respiratory failure in the setting of a CHF exacerbation and COPD exacerbation. Volume was managed with HD and repeat echocardiogram showed that his EF was preserved at 70 to 75% with no regional wall motion abnormalities. RV function was normal but PASP was moderately elevated at 57.3 mmHg. He did have mild MR, moderate MS and moderate to severe aortic valve stenosis. He did have a hospital follow-up visit with  Dr. Johney Frame on 10/16/2022 but no showed for the visit. Returned to the ED 10/17/22 for worsening dyspnea, found to have NSTEMI with troponin up to 6053. Transferred to [facility] for further evaluation.  Assessment & Plan    1. Recurrent acute on chronic hypoxic respiratory failure - multifactorial in setting of volume overload/acute on chronic HFpEF, valvular disease, NSTEMI, pulm HTN, COPD - CXR 11/8 c/w interstitial edema - cardiac management as below - d-dimer elevated but do not see PE w/u planned, suspect dyspnea primary driven by NSTEMI/CHF/COPD, with continued  CHF on CXR - per d/w renal APP, agree with HD today, need to optimize volume status as  he is very tenuous - pulm rx per IM, remains on [place] O2  2. NSTEMI/CAD, HLD - cath 10/18/22 with severe 3V CAD, severe AS, mildly elevated LV filling pressures, moderate pulm HTN, normal CO - currently on ASA, rosuvastatin, Toprol 34m daily, amlodipine 2.557mdaily  - will change rosuvastatin to atorvastatin per d/w pharmD given ESRD, will need to f/u values as OP - remains on heparin drip pending dispo for management - confirmed with cardmaster that she reached out to [facility] to consult. He is tenuous for CABG candidacy given his frailty and lung disease  3. Valvular heart disease with moderate-severe AS, moderate MR by echo - will review plans with MD  4. ESRD on HD - nephrology following  5. Essential HTN - manage in context above  6. Anemia - per medicine team  7. Frequent PVCs, brief a-tach vs flutter on tele (too short to characterize) 10/19/22 AM - consider titration of metoprolol  - lyte mgmt per nephrology, last K/Mg OK - get TSH with AM labs  For questions or updates, please contact [place] consult [email or web address] for contact info under Cardiology/STEMI.  Signed, DaCharlie PitterPA-C 10/19/2022, 9:23 AM    Patient seen, examined. Available data reviewed. Agree with findings, assessment, and plan as outlined by  DaMelina CopaPA-C.  The patient is independently interviewed and examined.  He is alert, elderly appearing, no distress.  HEENT is normal, carotid upstrokes are normal with bilateral bruits, lungs demonstrate coarse breath sounds bilaterally but good air movement, heart is regular rate and rhythm with a grade 3/6 harsh crescendo decrescendo murmur at the right upper sternal border and left lower sternal border, no diastolic murmur, abdomen is soft and nontender, extremities have no edema, skin is warm and dry with no rash.  I agree with the plans and findings outlined above.  The patient is medically very complicated with a lot of significant comorbidity.  He has been dialysis dependent for at least 3 years.  He has developed progressive valvular heart disease with both moderate mitral stenosis and probable severe aortic stenosis.  He now presents with progressive dyspnea likely related to acute diastolic heart failure in the setting of both ischemic and valvular heart disease.  Prior to this past week, the patient has been remarkably active in spite of all of his medical problems.  He drives himself to and from dialysis.  He also works part-time on a farm.  His progressive symptoms occur on a background of chronic tobacco abuse for over 60 years.  I have personally reviewed the patient's echo and cardiac catheterization images.  His echocardiogram shows vigorous LV systolic function.  He has exuberant MAC with moderate mitral stenosis, mean transmitral gradient of 10 mmHg.  He has severe calcification and restriction of the aortic valve leaflets with a mean gradient of 38 mmHg and dimensionless index greater than 0.25.  He has very severe coronary artery disease with severe calcific distal left main and proximal LAD disease as well as a severe ulcerated plaque in the distal RCA with associated heavy calcification.  The patient has known peripheral arterial disease with history of femoral endarterectomy and  femoropopliteal bypass on the right.  He has undergone formal cardiac surgical consultation and he is not a candidate for conventional heart surgery due to the multiple problems outlined above.  I am not sure that he  will be a candidate for transcatheter interventions at this point.  I think it is reasonable to check CT angiography studies of the heart as well as the chest, abdomen, and pelvis to define vascular anatomy, ability to accommodate hemodynamic support if needed, and ability to perform transcatheter valve intervention.  Once his CTA studies are completed, his case will be reviewed with the multidisciplinary team of specialist to evaluate his treatment options further.  He does not have angina, but he has significant obstructive CAD and I am not sure that he would tolerate TAVR without treatment of his coronary artery disease.  He understands that we may have nothing to safely offer him from an interventional standpoint and if that were the case his treatment would be palliative in nature.  Will order CTA studies and the structural heart team will follow-up early next week.  Sherren Mocha, M.D. 10/19/2022 1:09 PM

## 2022-10-19 NOTE — Progress Notes (Signed)
Increased work of breathing and shortness of breath after ambulation to bathroom. Oxygen increased to 3L. O2 sat in upper 90s. Crackles noted in bases upon auscultation. Became short of breath again an hour later while resting in the bed. O2 increased to 4L and PRN nebulizer administered after hearing patient become more wheezy. Post-neb feeling much better. Stated receiving 3 nebs yesterday.

## 2022-10-19 NOTE — Progress Notes (Signed)
Subjective: Seen in room, states still has some shortness of breath.  However he is not in distress.  Noted had 2.8 L UF yesterday 2 PM HD.  Denies any chest pain  Objective Vital signs in last 24 hours: Vitals:   10/18/22 2333 10/18/22 2354 10/19/22 0412 10/19/22 0816  BP: 120/74  132/77 118/69  Pulse: 88  87 77  Resp: 16  20 18   Temp: 98.4 F (36.9 C) 98.4 F (36.9 C) 97.7 F (36.5 C) 98.4 F (36.9 C)  TempSrc: Oral  Oral Oral  SpO2: 94%  98% 96%  Weight:   79.6 kg   Height:       Weight change: -5.939 kg  Physical Exam: General: Thin elderly male chronically ill-appearing no acute distress Heart: RRR, 2/6 SEM, no rub or gallop Lungs: Soft crackles anteriorly(currently getting IV/blood drawn) nasal cannula oxygen 98% O2 sat, not in respiratory distress but minimal increase WOB at rest Abdomen: NABS, soft NTND no ascites Extremities: No significant pedal edema  dialysis Access: RUA AVF+ bruit   OP HD= [facility] TTS , 4 hr 86min,  UF P #4. EDW 79.5 kg 2K 2.5 CA, 180 dialyzer , Heparin 5000 units Mircera 30 mcg every 2 weeks last given Calcitriol 0.25 p.o. on HD Access RUA AVF  Stat this a.m. chest x-ray=-Unchanged CHF pattern.   Problem/Plan: SOB/DOE with pulmonary edema, three-vessel CAD, AOS = HD again today secondary chest x-ray just done showed still showing CHF, plan HD/isolated UF today and tomorrow HD to keep on schedule ESRD -HD TTS, as above HD today secondary to CHF in the setting of his continued CHF, and tomorrow to keep on schedule NSTEMI /severe three-vessel obstructive CAD= urology following and consulting CT surgery.   Severe aortic stenosis= Card following, consulting CT surgery Anemia of CKD-Hgb 9.2, obtain outpatient ESA data and continue on schedule Aranesp weekly in hospital Secondary hyperparathyroidism -continue calcitriol and Velphoro as binder   Ernest Haber, PA-C [place] [phone] 10/19/2022,10:58 AM   LOS: 2 days   Labs: Basic Metabolic Panel: Recent Labs  Lab 10/17/22 1912 10/18/22 0318 10/18/22 1646 10/18/22 1650 10/19/22 0152  NA 133* 130* 136  135 136 134*  K 5.1 4.7 3.7  3.7 3.6 4.1  CL 91* 92*  --   --  94*  CO2 25 24  --   --  28  GLUCOSE 230* 173*  --   --  98  BUN 62* 68*  --   --  36*  CREATININE 8.75* 9.06*  --   --  5.37*  CALCIUM 8.3* 8.2*  --   --  8.4*  PHOS  --  7.4*  --   --  5.8*   Liver Function Tests: Recent Labs  Lab 10/17/22 1912 10/18/22 0318 10/19/22 0152  AST 40 47*  --   ALT 19 16  --   ALKPHOS 59 51  --   BILITOT 0.6 0.4  --   PROT 7.9 7.0  --   ALBUMIN 3.4* 3.0* 2.9*   No results for input(s): "LIPASE", "AMYLASE" in the last 168 hours. No results for input(s): "AMMONIA" in the last 168 hours. CBC: Recent Labs  Lab 10/13/22 0805 10/14/22 0355 10/17/22 1912 10/18/22 0318 10/18/22 1646 10/18/22 1650 10/19/22 0940  WBC 11.5* 7.4 13.0* 7.3  --   --  8.5  NEUTROABS 10.2*  --  11.5*  --   --   --   --   HGB 11.1* 9.5*  11.7* 9.6* 9.2*  8.8* 8.8* 9.2*  HCT 33.4* 28.1* 34.6* 28.6* 27.0*  26.0* 26.0* 27.9*  MCV 99.1 99.3 98.6 99.0  --   --  100.0  PLT 235 236 377 186  --   --  209   Cardiac Enzymes: No results for input(s): "CKTOTAL", "CKMB", "CKMBINDEX", "TROPONINI" in the last 168 hours. CBG: No results for input(s): "GLUCAP" in the last 168 hours.  Studies/Results: DG CHEST PORT 1 VIEW  Result Date: 10/19/2022 CLINICAL DATA:  Shortness of breath.  Respiratory distress. EXAM: PORTABLE CHEST 1 VIEW COMPARISON:  10/17/2022 FINDINGS: Cardiac enlargement, stable. Aortic atherosclerosis. Pulmonary vascular congestion and mild interstitial edema is unchanged. Blunting of the costophrenic angles are identified consistent with small effusions. No airspace consolidation. IMPRESSION: Unchanged CHF pattern. Electronically Signed   By: Kerby Moors M.D.   On: 10/19/2022 10:07   CARDIAC CATHETERIZATION  Result Date: 10/18/2022   Dist  RCA lesion is 60% stenosed.   Mid LM to Dist LM lesion is 70% stenosed.   Ost LAD to Prox LAD lesion is 80% stenosed.   Prox LAD to Mid LAD lesion is 85% stenosed.   Lat 1st Diag lesion is 90% stenosed.   1st Diag lesion is 70% stenosed.   Ramus lesion is 60% stenosed.   Ost Cx to Prox Cx lesion is 99% stenosed.   LV end diastolic pressure is mildly elevated.   Hemodynamic findings consistent with moderate pulmonary hypertension.   There is severe aortic valve stenosis. Severe 3 vessel obstructive CAD Severe aortic stenosis. Mean gradient 32 mm Hg. AVA 0.9 cm squared with index 0.46 Mildly elevated LV filling pressures. LVEDP 18 mm Hg. PCWP mean 19 mm Hg Moderate pulmonary HTN. PAP 54/21 mean 36 mm Hg Normal cardiac output. By Kathlen Brunswick 7.58 lL/min with index 3.92. by Thermodilution output 3.28 L/min with index 3.28. Plan; will consult CT surgery. I don't think there are any PCI options with severely calcified vessels, diffuse disease and trifurcation disease in LAD/diagonal distribution. Will review options  with heart team approach.   ECHOCARDIOGRAM LIMITED  Result Date: 10/18/2022    ECHOCARDIOGRAM LIMITED REPORT   Patient Name:   Kerry Underwood Date of Exam: 10/18/2022 Medical Rec #:  [id number]    Height:       65.0 in Accession #:    [id number]   Weight:       188.5 lb Date of Birth:  [birth year]    BSA:          1.929 m Patient Age:    71 years     BP:           125/63 mmHg Patient Gender: M            HR:           76 bpm. Exam Location:  [facility] Procedure: Limited Echo Indications:    NSTEMI  History:        Patient has prior history of Echocardiogram examinations, most                 recent 10/13/2022. CHF, Acute MI, COPD; Risk                 Factors:Hypertension.  Sonographer:    Wenda Low Referring Phys: [id number] [place]  1. Limited Echo to assess change in LVEF. Doppler was not performed.  2. Left ventricular ejection fraction, by estimation, is 70 to 75%. The left ventricle  has hyperdynamic function. The  left ventricle has no regional wall motion abnormalities. There is mild left ventricular hypertrophy.  3. Severe mitral annular calcification.  4. The inferior vena cava is normal in size with greater than 50% respiratory variability, suggesting right atrial pressure of 3 mmHg.Limited FINDINGS  Left Ventricle: Left ventricular ejection fraction, by estimation, is 70 to 75%. The left ventricle has hyperdynamic function. The left ventricle has no regional wall motion abnormalities. There is mild left ventricular hypertrophy. Mitral Valve: Severe mitral annular calcification. Tricuspid Valve: The tricuspid valve is not well visualized. Aortic Valve: The aortic valve was not well visualized. Pulmonic Valve: The pulmonic valve was not well visualized. Venous: The inferior vena cava is normal in size with greater than 50% respiratory variability, suggesting right atrial pressure of 3 mmHg. LEFT VENTRICLE PLAX 2D LVIDd:         4.60 cm LVIDs:         2.80 cm LV PW:         1.30 cm LV IVS:        1.30 cm  LEFT ATRIUM         Index LA diam:    4.60 cm 2.38 cm/m Vishnu Priya Mallipeddi Electronically signed by Lorelee Cover Mallipeddi Signature Date/Time: 10/18/2022/11:37:44 AM    Final    DG Chest Portable 1 View  Result Date: 10/17/2022 CLINICAL DATA:  Shortness of breath. EXAM: PORTABLE CHEST 1 VIEW COMPARISON:  Chest radiograph dated 10/13/2022. FINDINGS: Diffuse interstitial prominence and Kerley B-lines consistent with edema. Mild blunting of the costophrenic angles, new or progressed since the prior radiograph and may represent small pleural effusions. No consolidative changes. No pneumothorax. The cardiac silhouette is within normal limits. Atherosclerotic calcification of the aorta. No acute osseous pathology. IMPRESSION: Relatively similar appearance of interstitial edema. Electronically Signed   By: Anner Crete M.D.   On: 10/17/2022 19:55   Medications:  sodium chloride      heparin 1,350 Units/hr (10/19/22 0401)    amLODipine  2.5 mg Oral QHS   aspirin EC  81 mg Oral Daily   atorvastatin  80 mg Oral Daily   calcitRIOL  0.25 mcg Oral Q T,Th,Sa-HD   fluticasone furoate-vilanterol  1 puff Inhalation Daily   metoprolol succinate  25 mg Oral Daily   sodium chloride flush  3 mL Intravenous Q12H   sucroferric oxyhydroxide  500 mg Oral TID WC   tamsulosin  0.4 mg Oral QHS   umeclidinium bromide  1 puff Inhalation Daily

## 2022-10-19 NOTE — Consult Note (Signed)
[address]            [place] [address]          [phone]       Lupe Ohm [facility] Medical Record #[id number] Date of Birth: December 29, 1950  No ref. provider foundDr P Martinique   Chief Complaint:    Chief Complaint  Patient presents with   Respiratory Distress  Patient examined, images of echocardiogram and recent coronary arteriogram and chest x-rays personally reviewed and discussed with patient.  History of Present Illness:     I was asked to evaluate this patient for surgery after being diagnosed with severe aortic stenosis and moderate multivessel coronary artery disease.  Patient has been admitted to the hospital twice in the past 2 weeks for acute respiratory insufficiency/pulmonary edema secondary to his aortic stenosis.  On most recent admission he had a bump in cardiac enzymes with high-sensitivity troponin greater than 5000.  His echocardiogram however shows preserved LV EF with moderate LVH.  Cardiac catheterization yesterday demonstrates mean transvalvular aortic gradient of 32 mmHg with a calculated area 0.9 cm.  A color Doppler echocardiogram performed last week for his first admission showed severe aortic stenosis with a mean aortic gradient of 34 mmHg.  He has moderate stenosis of the LAD and large dominant RCA with high-grade stenosis of a small nondominant circumflex.  Patient denies any associated chest pain with his sudden expiratory events.  He denies any history of presyncope or syncope or palpitations  Patient is actively smoking he has smoked greater than 50 years.  He is not on home oxygen.  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  He has not had PFTs but bedside mechanics show poor air movement and he has rhonchi and wheezing on exam. He would not tolerate sternotomy due to intrinsic lung disease.   The patient has been on hemodialysis for 3 years for end-stage renal disease secondary to hypertension.  He  has remained functional and able to drive to dialysis and live independently up until the  2 most recent admissions for acute respiratory distress  Patient also has peripheral vascular disease and has had a right femoropopliteal bypass with saphenous vein and a right femoral artery  patch angioplasty.  Patient has history of viral hepatitis and probably some degree of cirrhosis by past CT scan.  Patient is edentulous and has upper and lower dental plates.  His weight has been stable this fall and has done well until his recent hospitalizations.  Current Activity/ Functional  Zubrod Score: At the time of surgery this patient's most appropriate activity status/level should be described as: []     0    Normal activity, no symptoms []     1    Restricted in physical strenuous activity but ambulatory, able to do out light work [x]     2    Ambulatory and capable of self care, unable to do work activities, up and about >50 % of waking hours                              []     3    Only limited self care, in bed greater than 50% of waking hours []     4    Completely disabled, no self care, confined to bed or chair []   5    Moribund    Past Medical History:  Diagnosis Date   Anemia    CHF (congestive heart failure) (HCC)    COPD (chronic obstructive pulmonary disease) ([facility])    Dialysis patient ([facility])    Full dentures    Hypertension    Pneumonia    Pyelonephritis 09/11/2017   Renal disorder    Stroke [facility])    " A few mini strokes, I didn't know it "    Past Surgical History:  Procedure Laterality Date   ABDOMINAL AORTOGRAM W/LOWER EXTREMITY Bilateral 09/20/2021   Procedure: ABDOMINAL AORTOGRAM W/LOWER EXTREMITY;  Surgeon: Broadus John, MD;  Location: [facility];  Service: Cardiovascular;  Laterality: Bilateral;   AMPUTATION TOE Right 09/22/2021   Procedure: PARTIAL AMPUTATION SECOND RIGHT TOE;  Surgeon: Marty Heck, MD;  Location: [place];  Service: Vascular;   Laterality: Right;   [place] Right 04/19/2020   Procedure: Basilic Vein Transposition Right Arm;  Surgeon: Waynetta Sandy, MD;  Location: [facility];  Service: Vascular;  Laterality: Right;   [place] Right 02/13/2022   Procedure: RIGHT ARM SECOND STAGE BASILIC VEIN TRANSPOSITION;  Surgeon: Rosetta Posner, MD;  Location: [facility];  Service: Vascular;  Laterality: Right;   COLON SURGERY     ENDARTERECTOMY FEMORAL Right 09/22/2021   Procedure: ENDARTERECTOMY FEMORAL WITH PATCH ANGIOPLASTY USING 1X6CM Weyman Pedro;  Surgeon: Marty Heck, MD;  Location: [facility];  Service: Vascular;  Laterality: Right;   FEMORAL-TIBIAL BYPASS GRAFT Right 09/22/2021   Procedure: RIGHT FEMORAL TO BELOW KNEE POPLITEAL BYPASS WITH HARVEST OF GREATER SAPHENOUS VEIN;  Surgeon: Marty Heck, MD;  Location: [facility];  Service: Vascular;  Laterality: Right;   INSERTION OF DIALYSIS CATHETER Right 04/19/2020   Procedure: Insertion Of Right Internal Jugular Dialysis Catheter;  Surgeon: Waynetta Sandy, MD;  Location[facility];  Service: Vascular;  Laterality: Right;   MULTIPLE TOOTH EXTRACTIONS     PERIPHERAL VASCULAR BALLOON ANGIOPLASTY Right 09/20/2021   Procedure: PERIPHERAL VASCULAR BALLOON ANGIOPLASTY;  Surgeon: Broadus John, MD;  Location: [facility];  Service: Cardiovascular;  Laterality: Right;  SFA  FAILED   REMOVAL OF A DIALYSIS CATHETER N/A 05/01/2022   Procedure: MINOR REMOVAL OF A TUNNELED DIALYSIS CATHETER;  Surgeon: Rosetta Posner, MD;  Location: [facility];  Service: Vascular;  Laterality: N/A;   RIGHT/LEFT HEART CATH AND CORONARY ANGIOGRAPHY N/A 10/18/2022   Procedure: RIGHT/LEFT HEART CATH AND CORONARY ANGIOGRAPHY;  Surgeon: Martinique, Leif Loflin M, MD;  Location: [facility];  Service: Cardiovascular;  Laterality: N/A;    Social History   Tobacco Use  Smoking Status Every Day   Packs/day: 0.50   Years: 62.00   Total pack years: 31.00    Types: Cigarettes  Smokeless Tobacco Former   Types: Chew  Tobacco Comments   Started smoking at age 80 - 5 his own tobacco cigarettes     Social History   Substance and Sexual Activity  Alcohol Use No    Social History   Socioeconomic History   Marital status: Single    Spouse name: Not on file   Number of children: Not on file   Years of education: Not on file   Highest education level: Not on file  Occupational History   Occupation: retired  Tobacco Use   Smoking status: Every Day    Packs/day: 0.50    Years: 62.00    Total pack years: 31.00  Types: Cigarettes   Smokeless tobacco: Former    Types: Chew   Tobacco comments:    Started smoking at age 29 - 89 his own tobacco cigarettes   Vaping Use   Vaping Use: Never used  Substance and Sexual Activity   Alcohol use: No   Drug use: Yes    Types: Marijuana    Comment: once every 3-4 weeks   Sexual activity: Not Currently    Birth control/protection: None  Other Topics Concern   Not on file  Social History Narrative   Lives alone. No family nearby. He does get out and socialize with friends.    Doesn't have  a good relationship with his children   Social Determinants of Health   Financial Resource Strain: Medium Risk (11/22/2021)   Overall Financial Resource Strain (CARDIA)    Difficulty of Paying Living Expenses: Somewhat hard  Food Insecurity: No Food Insecurity (10/13/2022)   Hunger Vital Sign    Worried About Running Out of Food in the Last Year: Never true    Ran Out of Food in the Last Year: Never true  Transportation Needs: No Transportation Needs (10/13/2022)   PRAPARE - Hydrologist (Medical): No    Lack of Transportation (Non-Medical): No  Physical Activity: Sufficiently Active (11/22/2021)   Exercise Vital Sign    Days of Exercise per Week: 5 days    Minutes of Exercise per Session: 30 min  Stress: No Stress Concern Present (11/22/2021)   [place]    Feeling of Stress : Not at all  Social Connections: Socially Isolated (11/22/2021)   Social Connection and Isolation Panel [NHANES]    Frequency of Communication with Friends and Family: More than three times a week    Frequency of Social Gatherings with Friends and Family: More than three times a week    Attends Religious Services: Never    Marine scientist or Organizations: No    Attends Archivist Meetings: Never    Marital Status: Never married  Intimate Partner Violence: Not At Risk (10/13/2022)   Humiliation, Afraid, Rape, and Kick questionnaire    Fear of Current or Ex-Partner: No    Emotionally Abused: No    Physically Abused: No    Sexually Abused: No    Allergies  Allergen Reactions   Strawberry (Diagnostic) Rash    Current Facility-Administered Medications  Medication Dose Route Frequency Provider Last Rate Last Admin   0.9 %  sodium chloride infusion  250 mL Intravenous PRN Martinique, Tyvion Edmondson M, MD       acetaminophen (TYLENOL) tablet 650 mg  650 mg Oral Q6H PRN Martinique, Rasheem Figiel M, MD       Or   acetaminophen (TYLENOL) suppository 650 mg  650 mg Rectal Q6H PRN Martinique, Sabena Winner M, MD       albuterol (PROVENTIL) (2.5 MG/3ML) 0.083% nebulizer solution 2.5 mg  2.5 mg Nebulization Q4H PRN Martinique, Chee Kinslow M, MD   2.5 mg at 10/19/22 0458   amLODipine (NORVASC) tablet 2.5 mg  2.5 mg Oral QHS Martinique, Irianna Gilday M, MD   2.5 mg at 10/18/22 2113   aspirin EC tablet 81 mg  81 mg Oral Daily Martinique, Kean Gautreau M, MD   81 mg at 10/18/22 1432   atorvastatin (LIPITOR) tablet 80 mg  80 mg Oral Daily Dunn, Dayna N, PA-C       calcitRIOL (ROCALTROL) capsule 0.25 mcg  0.25 mcg  Oral Q T,Th,Sa-HD Martinique, Emorie Mcfate M, MD   0.25 mcg at 10/18/22 1432   fluticasone furoate-vilanterol (BREO ELLIPTA) 200-25 MCG/ACT 1 puff  1 puff Inhalation Daily Martinique, Alexza Norbeck M, MD       heparin ADULT infusion 100 units/mL (25000 units/268mL)  1,600 Units/hr  Intravenous Continuous Einar Grad, RPH 13.5 mL/hr at 10/19/22 0401 1,350 Units/hr at 10/19/22 0401   heparin injection 1,000 Units  1,000 Units Dialysis PRN Martinique, Everet Flagg M, MD       lidocaine (PF) (XYLOCAINE) 1 % injection 5 mL  5 mL Intradermal PRN Martinique, Omran Keelin M, MD       lidocaine-prilocaine (EMLA) cream 1 Application  1 Application Topical PRN Martinique, Rease Swinson M, MD       metoprolol succinate (TOPROL-XL) 24 hr tablet 25 mg  25 mg Oral Daily Martinique, Shareen Capwell M, MD   25 mg at 10/18/22 1432   morphine (PF) 2 MG/ML injection 2 mg  2 mg Intravenous Q2H PRN Martinique, Garey Alleva M, MD       ondansetron [facility]) tablet 4 mg  4 mg Oral Q6H PRN Martinique, Clatie Kessen M, MD       Or   ondansetron [facility]) injection 4 mg  4 mg Intravenous Q6H PRN Martinique, Kalyiah Saintil M, MD       oxyCODONE (Oxy IR/ROXICODONE) immediate release tablet 5 mg  5 mg Oral Q4H PRN Martinique, Demarko Zeimet M, MD       pentafluoroprop-tetrafluoroeth [facility]) aerosol 1 Application  1 Application Topical PRN Martinique, Mamoudou Mulvehill M, MD       sodium chloride flush (NS) 0.9 % injection 3 mL  3 mL Intravenous Q12H Martinique, Goodwin Kamphaus M, MD   3 mL at 10/18/22 2113   sodium chloride flush (NS) 0.9 % injection 3 mL  3 mL Intravenous PRN Martinique, Kerby Hockley M, MD       sucroferric oxyhydroxide [facility]) chewable tablet 500 mg  500 mg Oral TID WC Martinique, Avari Gelles M, MD       tamsulosin [facility]) capsule 0.4 mg  0.4 mg Oral QHS Martinique, Jarica Plass M, MD   0.4 mg at 10/18/22 2113   umeclidinium bromide (INCRUSE ELLIPTA) 62.5 MCG/ACT 1 puff  1 puff Inhalation Daily Martinique, Adelis Docter M, MD         Family History  Problem Relation Age of Onset   Alzheimer's disease Mother      Review of Systems:     Cardiac Review of Systems: Y or N  Chest Pain [    ]  Resting SOB [   ] Exertional SOB  [  y]  Orthopnea [  y]   Pedal Edema [   ]    Palpitations [  ] Syncope  [  ]   Presyncope [   ]  General Review of Systems: [Y] = yes [  ]=no Constitional: recent weight change [  ]; anorexia [  ]; fatigue [  ];  nausea [  ]; night sweats [  ]; fever [  ]; or chills [  ];  Dental: poor dentition[  ]; Last Dentist visit: none- edentulous  Eye : blurred vision [  ]; diplopia [   ]; vision changes [  ];  Amaurosis fugax[  ]; Resp: cough [  ];  wheezing[ y ];  hemoptysis[  ]; shortness of breath[ y ]; paroxysmal nocturnal dyspnea[  ]; dyspnea on exertion[  ]; or orthopnea[  ];  GI:  gallstones[  ], vomiting[  y];  dysphagia[  ]; melena[  ];  hematochezia [  ]; heartburn[  ];   Hx of  Colonoscopy[  ]; GU: kidney stones [  ]; hematuria[  ];   dysuria [  ];  nocturia[  ];  history of     obstruction [  ];                 Skin: rash, swelling[  ];, hair loss[  ];  peripheral edema[  ];  or itching[  ]; Musculosketetal: myalgias[  ];  joint swelling[  ];  joint erythema[  ];  joint pain[  ];  back pain[  ];  Heme/Lymph: bruising[  ];  bleeding[  ];  anemia[  ];  Neuro: TIA[  ];  headaches[  ];  stroke[  ];  vertigo[  ];  seizures[  ];   paresthesias[  ];  difficulty walking[  ];  Psych:depression[  ]; anxiety[  ];  Endocrine: diabetes[  ];  thyroid dysfunction[  ];  Immunizations: Flu [  ]; Pneumococcal[  ];  Other: Right-hand-dominant                         No difficulty with general anesthesia for previous operative procedures including femoropopliteal bypass  Physical Exam: BP 118/69 (BP Location: Left Arm)   Pulse 77   Temp 98.4 F (36.9 C) (Oral)   Resp 18   Ht 5\' 5"  (1.651 m)   Wt 79.6 kg   SpO2 96%   BMI 29.19 kg/m       Physical Exam  General: Somewhat disheveled 71 year old male in bed in no acute distress and very well spoken regarding his health problems and symptoms. HEENT: Normocephalic pupils equal , dentition full dental plates Neck: Supple without JVD, adenopathy, or bruit Chest: Increased AP diameter.  Bilateral scattered wheezes and  rhonchi Cardiovascular: Regular rate and rhythm, 3/6 SEM murmur, no gallop, peripheral pulses         not  palpable but lower extremities warm and well-perfused without edema Abdomen:  Soft, nontender, no palpable mass or organomegaly Extremities: Warm, well-perfused, 2+clubbing without cyanosis edema or tenderness,              no venous stasis changes of the legs Rectal/GU: Deferred Neuro: Grossly non--focal and symmetrical throughout Skin: Clean and dry without rash or ulceration    Diagnostic Studies & Laboratory data:     Recent Radiology Findings:   DG CHEST PORT 1 VIEW  Result Date: 10/19/2022 CLINICAL DATA:  Shortness of breath.  Respiratory distress. EXAM: PORTABLE CHEST 1 VIEW COMPARISON:  10/17/2022 FINDINGS: Cardiac enlargement, stable. Aortic atherosclerosis. Pulmonary vascular congestion and mild interstitial edema is unchanged. Blunting of the costophrenic angles are identified consistent with small effusions. No airspace consolidation. IMPRESSION: Unchanged CHF pattern. Electronically Signed   By: Kerby Moors M.D.   On: 10/19/2022 10:07   CARDIAC CATHETERIZATION  Result Date: 10/18/2022   Dist RCA lesion is 60% stenosed.   Mid LM to Dist LM lesion is 70% stenosed.  Ost LAD to Prox LAD lesion is 80% stenosed.   Prox LAD to Mid LAD lesion is 85% stenosed.   Lat 1st Diag lesion is 90% stenosed.   1st Diag lesion is 70% stenosed.   Ramus lesion is 60% stenosed.   Ost Cx to Prox Cx lesion is 99% stenosed.   LV end diastolic pressure is mildly elevated.   Hemodynamic findings consistent with moderate pulmonary hypertension.   There is severe aortic valve stenosis. Severe 3 vessel obstructive CAD Severe aortic stenosis. Mean gradient 32 mm Hg. AVA 0.9 cm squared with index 0.46 Mildly elevated LV filling pressures. LVEDP 18 mm Hg. PCWP mean 19 mm Hg Moderate pulmonary HTN. PAP 54/21 mean 36 mm Hg Normal cardiac output. By Kathlen Brunswick 7.58 lL/min with index 3.92. by Thermodilution output  3.28 L/min with index 3.28. Plan; will consult CT surgery. I don't think there are any PCI options with severely calcified vessels, diffuse disease and trifurcation disease in LAD/diagonal distribution. Will review options  with heart team approach.   ECHOCARDIOGRAM LIMITED  Result Date: 10/18/2022    ECHOCARDIOGRAM LIMITED REPORT   Patient Name:   MEER REINDL Date of Exam: 10/18/2022 Medical Rec #:  [id number]    Height:       65.0 in Accession #:    [id number]   Weight:       188.5 lb Date of Birth:  [birth year]    BSA:          1.929 m Patient Age:    32 years     BP:           125/63 mmHg Patient Gender: M            HR:           76 bpm. Exam Location:  [facility] Procedure: Limited Echo Indications:    NSTEMI  History:        Patient has prior history of Echocardiogram examinations, most                 recent 10/13/2022. CHF, Acute MI, COPD; Risk                 Factors:Hypertension.  Sonographer:    Wenda Low Referring Phys: [id number] [place]  1. Limited Echo to assess change in LVEF. Doppler was not performed.  2. Left ventricular ejection fraction, by estimation, is 70 to 75%. The left ventricle has hyperdynamic function. The left ventricle has no regional wall motion abnormalities. There is mild left ventricular hypertrophy.  3. Severe mitral annular calcification.  4. The inferior vena cava is normal in size with greater than 50% respiratory variability, suggesting right atrial pressure of 3 mmHg.Limited FINDINGS  Left Ventricle: Left ventricular ejection fraction, by estimation, is 70 to 75%. The left ventricle has hyperdynamic function. The left ventricle has no regional wall motion abnormalities. There is mild left ventricular hypertrophy. Mitral Valve: Severe mitral annular calcification. Tricuspid Valve: The tricuspid valve is not well visualized. Aortic Valve: The aortic valve was not well visualized. Pulmonic Valve: The pulmonic valve was not well visualized. Venous:  The inferior vena cava is normal in size with greater than 50% respiratory variability, suggesting right atrial pressure of 3 mmHg. LEFT VENTRICLE PLAX 2D LVIDd:         4.60 cm LVIDs:         2.80 cm LV PW:         1.30 cm LV IVS:  1.30 cm  LEFT ATRIUM         Index LA diam:    4.60 cm 2.38 cm/m Vishnu Priya Mallipeddi Electronically signed by Lorelee Cover Mallipeddi Signature Date/Time: 10/18/2022/11:37:44 AM    Final    DG Chest Portable 1 View  Result Date: 10/17/2022 CLINICAL DATA:  Shortness of breath. EXAM: PORTABLE CHEST 1 VIEW COMPARISON:  Chest radiograph dated 10/13/2022. FINDINGS: Diffuse interstitial prominence and Kerley B-lines consistent with edema. Mild blunting of the costophrenic angles, new or progressed since the prior radiograph and may represent small pleural effusions. No consolidative changes. No pneumothorax. The cardiac silhouette is within normal limits. Atherosclerotic calcification of the aorta. No acute osseous pathology. IMPRESSION: Relatively similar appearance of interstitial edema. Electronically Signed   By: Anner Crete M.D.   On: 10/17/2022 19:55      Recent Lab Findings: Lab Results  Component Value Date   WBC 8.5 10/19/2022   HGB 9.2 (L) 10/19/2022   HCT 27.9 (L) 10/19/2022   PLT 209 10/19/2022   GLUCOSE 98 10/19/2022   CHOL 132 10/19/2022   TRIG 88 10/19/2022   HDL 37 (L) 10/19/2022   LDLCALC 77 10/19/2022   ALT 16 10/18/2022   AST 47 (H) 10/18/2022   NA 134 (L) 10/19/2022   K 4.1 10/19/2022   CL 94 (L) 10/19/2022   CREATININE 5.37 (H) 10/19/2022   BUN 36 (H) 10/19/2022   CO2 28 10/19/2022   TSH 2.990 08/23/2021   HGBA1C 5.4 10/19/2022      Assessment / Plan:   Patient with 2 recent episodes of acute respiratory insufficiency secondary to pulmonary edema.  He has moderate to severe aortic stenosis with preserved LV systolic function.  He has moderate coronary artery disease, however his symptoms are probably related more to his  aortic stenosis.  With underlying severe COPD and dialysis dependent he would not benefit from surgical AVR with CABG.  He may be a candidate for TAVR will be seen by the multidisciplinary team for evaluation.

## 2022-10-20 DIAGNOSIS — I25118 Atherosclerotic heart disease of native coronary artery with other forms of angina pectoris: Secondary | ICD-10-CM | POA: Diagnosis not present

## 2022-10-20 DIAGNOSIS — J9601 Acute respiratory failure with hypoxia: Secondary | ICD-10-CM | POA: Diagnosis not present

## 2022-10-20 DIAGNOSIS — I350 Nonrheumatic aortic (valve) stenosis: Secondary | ICD-10-CM | POA: Diagnosis not present

## 2022-10-20 LAB — RENAL FUNCTION PANEL
Albumin: 2.8 g/dL — ABNORMAL LOW (ref 3.5–5.0)
Anion gap: 10 (ref 5–15)
BUN: 53 mg/dL — ABNORMAL HIGH (ref 8–23)
CO2: 27 mmol/L (ref 22–32)
Calcium: 8.4 mg/dL — ABNORMAL LOW (ref 8.9–10.3)
Chloride: 94 mmol/L — ABNORMAL LOW (ref 98–111)
Creatinine, Ser: 7.32 mg/dL — ABNORMAL HIGH (ref 0.61–1.24)
GFR, Estimated: 7 mL/min — ABNORMAL LOW (ref >60)
Glucose, Bld: 95 mg/dL (ref 70–99)
Phosphorus: 6.5 mg/dL — ABNORMAL HIGH (ref 2.5–4.6)
Potassium: 4.4 mmol/L (ref 3.5–5.1)
Sodium: 131 mmol/L — ABNORMAL LOW (ref 135–145)

## 2022-10-20 LAB — CBC
HCT: 27 % — ABNORMAL LOW (ref 39.0–52.0)
Hemoglobin: 9.2 g/dL — ABNORMAL LOW (ref 13.0–17.0)
MCH: 33.6 pg (ref 26.0–34.0)
MCHC: 34.1 g/dL (ref 30.0–36.0)
MCV: 98.5 fL (ref 80.0–100.0)
Platelets: 183 10*3/uL (ref 150–400)
RBC: 2.74 MIL/uL — ABNORMAL LOW (ref 4.22–5.81)
RDW: 15 % (ref 11.5–15.5)
WBC: 8.1 10*3/uL (ref 4.0–10.5)
nRBC: 0 % (ref 0.0–0.2)

## 2022-10-20 LAB — HEPARIN LEVEL (UNFRACTIONATED)
Heparin Unfractionated: 0.71 IU/mL — ABNORMAL HIGH (ref 0.30–0.70)
Heparin Unfractionated: 0.6 IU/mL (ref 0.30–0.70)

## 2022-10-20 LAB — TSH: TSH: 5.173 u[IU]/mL — ABNORMAL HIGH (ref 0.350–4.500)

## 2022-10-20 LAB — T4, FREE: Free T4: 0.92 ng/dL (ref 0.61–1.12)

## 2022-10-20 LAB — LIPOPROTEIN A (LPA): Lipoprotein (a): 35.5 nmol/L — ABNORMAL HIGH (ref <75.0)

## 2022-10-20 MED ORDER — AZITHROMYCIN 250 MG PO TABS
250.0000 mg | ORAL_TABLET | Freq: Every day | ORAL | Status: DC
  Administered 2022-10-21 – 2022-10-22 (×2): 250 mg via ORAL
  Filled 2022-10-20 (×2): qty 1

## 2022-10-20 MED ORDER — AZITHROMYCIN 250 MG PO TABS: 250.0000 mg | ORAL_TABLET | Freq: Every day | ORAL | Status: DC

## 2022-10-20 MED ORDER — SODIUM CHLORIDE 0.9 % IV SOLN
1.0000 g | INTRAVENOUS | Status: DC
  Administered 2022-10-20 – 2022-10-22 (×3): 1 g via INTRAVENOUS
  Filled 2022-10-20 (×4): qty 10

## 2022-10-20 MED ORDER — DARBEPOETIN ALFA 40 MCG/0.4ML IJ SOSY
40.0000 ug | PREFILLED_SYRINGE | INTRAMUSCULAR | Status: DC
  Administered 2022-10-20: 40 ug via SUBCUTANEOUS
  Filled 2022-10-20: qty 0.4

## 2022-10-20 MED ORDER — AZITHROMYCIN 250 MG PO TABS
500.0000 mg | ORAL_TABLET | Freq: Every day | ORAL | Status: AC
  Administered 2022-10-20: 500 mg via ORAL
  Filled 2022-10-20: qty 2

## 2022-10-20 NOTE — Progress Notes (Signed)
PT Cancellation Note  Patient Details Name: Kerry Underwood MRN: [id number] DOB: [birth year]   Cancelled Treatment:    Reason Eval/Treat Not Completed: Patient at procedure or test/unavailable (HD). Will follow-up for PT treatment as schedule permits.  Mabeline Caras, PT, DPT Acute Rehabilitation Services  Personal: [facility] Office: [place] 10/20/2022, 2:20 PM

## 2022-10-20 NOTE — Progress Notes (Addendum)
Notified by nurse regarding concern for cath site ooze. Dr. Acie Fredrickson had evaluated earlier per notes, new dressing placed at that time. The new dressing has also had fresh blood come through. On exam, left groin cath site continues to have very slow ooze. No overt hematoma or groin pain. Patient remains clinically stable, breathing appears improved from yesterday, Hgb similar to prior. Per d/w Dr. Debara Pickett, we will stop heparin for now and have staff apply pressure for 20 minutes then reassess. Will also keep on bedrest for today. I changed heparin order to tentatively begin tomorrow AM as DVT prophylaxis if groin ooze resolved just so this is not overlooked. Hold off SCDs to avoid manual compression to the blood vessels in that leg. CBC also ordered for AM.  Addendum: slow ooze despite 27m hold. Reviewed with Dr. Debara Pickett who suggests trial of pressure dressing, time to allow heparin to dissipate. He is OK with patient going to HD but suggested to defer decision to nephrology, nurse aware. Nephrology agreed with proceeding with dialysis as scheduled. This also lended some additional bedrest time. Patient remains hemodynamically stable at this time.   Addendum 5pm: Dr. Debara Pickett reassessed in HD and groin bandage was dry. Will need re-eval of groin site in AM. CBC ordered for AM. Please reach out to cardiology for any recurrent issues.

## 2022-10-20 NOTE — Progress Notes (Signed)
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  Allergies  Allergen Reactions   Strawberry (Diagnostic) Rash    Patient Measurements: Height: 5\' 5"  (165.1 cm) Weight: 78.4 kg (172 lb 13.5 oz) IBW/kg (Calculated) : 61.5 Heparin Dosing Weight: 80kg  Vital Signs: Temp: 99.3 F (37.4 C) (11/10 2339) Temp Source: Oral (11/10 2339) BP: 121/64 (11/10 2339) Pulse Rate: 73 (11/10 2339)  Labs: Recent Labs    10/17/22 2104 10/17/22 2344 10/18/22 0318 10/18/22 0941 10/18/22 1650 10/19/22 0152 10/19/22 0940 10/19/22 1813 10/20/22 0105  HGB  --   --  9.6*   < > 8.8*  --  9.2*  --  9.2*  HCT  --   --  28.6*   < > 26.0*  --  27.9*  --  27.0*  PLT  --   --  186  --   --   --  209  --  183  HEPARINUNFRC  --   --   --    < >  --   --  0.16* 0.53 0.71*  CREATININE  --   --  9.06*  --   --  5.37*  --   --  7.32*  TROPONINIHS 1,286* 3,927* 6,053*  --   --   --   --   --   --    < > = values in this interval not displayed.     Estimated Creatinine Clearance: 8.7 mL/min (A) (by C-G formula based on SCr of 7.32 mg/dL (H)).  Assessment: 71yo male presents w/ respiratory distress, initial troponin elevated and rising, started on IV heparin for ACS.  Pt s/p LHC with mvCAD, planning surgical consult. Heparin level just above goal @ 0.71. No overt s/sx of bleeding per RN. Hgb stable  Goal of Therapy:  Heparin level 0.3-0.7 units/ml Monitor platelets by anticoagulation protocol: Yes   Plan:  Reduce heparin gtt to 1500 units/hr Monitor for s/sx of bleeding Daily heparin level and CBC  Georga Bora, PharmD Clinical Pharmacist 10/20/2022 3:17 AM Please check AMION for all [place] numbers

## 2022-10-20 NOTE — Progress Notes (Addendum)
During assessment Left Groin cath site dressing bloody.  Asked Dr. Acie Fredrickson (on department) to come assess patient.  Dr. Acie Fredrickson states no need to hold pressure, stating it is a venous ooze, MD asked RN to apply new dressing and re assess later.  Patient denies pain at site or in abdomen.  New dressing applied.

## 2022-10-20 NOTE — Progress Notes (Signed)
[facility] PROGRESS NOTE  Kerry Underwood (DOB: 08-15-1951) SPQ:[id number] PCP: [name], FNP   Brief Narrative: Kerry Underwood is a 71 y.o. male with a history of ESRD, COPD, CVA, HTN, and recent admission for acute on chronic HFpEF who presented to the ED with at [facility] on 10/17/2022 with worsening exertional dyspnea, also episode of presyncope found to have wheezing, pulmonary edema and NSTEMI with troponins rising precipitously. He was transferred to [facility] for cardiac catheterization which showed severe multivessel CAD for which CT surgery was consulted. Also found to have severe aortic stenosis, felt to be a significant driver of symptoms. His COPD and ESRD appear to present prohibitive risk for CABG, though work up for AVR is underway. He continues to have respiratory distress, undergoing additional UF with HD for continued pulmonary edema, getting nebulized therapies for possibility of superimposed COPD exacerbation.  Subjective: Has been wheezing for 20 years, does not think this is worse currently, but cannot catch his breath when ambulating despite adequate oxygenation.  Objective: BP 123/69 (BP Location: Left Arm)   Pulse 73   Temp 98.5 F (36.9 C) (Oral)   Resp 17   Ht 5\' 5"  (1.651 m)   Wt 78.6 kg   SpO2 98%   BMI 28.84 kg/m   Gen: Pleasant, chronically ill-appearing male in no distress Pulm: Diminished but clear, nonlabored  CV: RRR, II/VI RUSB and II/VI systolic murmurs at apex. GI: Soft, NT, ND, +BS  Neuro: Alert and oriented. No new focal deficits. Ext: Warm, no deformities Skin: No significant edema, no new rashes, lesions or ulcers on visualized skin   Assessment & Plan: Acute hypoxic respiratory failure: Due to acute on chronic HFpEF/pulmonary edema. Baseline COPD contributing, though not clearly in severe exacerbation. Given respiratory distress out of proportion to crackles on exam, suspect there is cardiac component.  - Continue supplemental oxygen to  maintain SpO2 >89%, can use BiPAP prn for WOB as well. Some chronic CO2 retention noted by blood gas with normal pH.  - D-dimer modestly elevated. Though there are plenty of explanations for his presentation, CTA (as part of valve evaluation) should sufficiently evaluate for PE. Read still pending. - Repeat HD today, still some pleural effusions on CT after HD yesterday. - Monitor I/O, weights closely - Continue bronchodilators, avoiding steroids since no current wheezing. - CT showed tree-in-bud subpleural opacities, had WBC on admission that resolved without abx. Given respiratory failure, will treat empirically with ceftriaxone/azithromycin x5 days - Some atelectasis noted as well, continue OOB as much as possible, incentive spirometry.   Multivessel CAD, NSTEMI, HLD: Said not to have good PCI targets and cardiothoracic surgery evaluation stated he is at prohibitive risk for CABG.  - Continue heparin gtt, ASA - Continue statin, changing to atorvastatin given ESRD. LDL is 77. HbA1c is 5.4%.  - Continue metoprolol 25mg  daily, norvasc 2.5mg  daily. - prn NTG.  Ventricular bigeminy: Noted on personal review of telemetry and seen on monitor during encounter.  - Defer rhythm management to cardiology, continuing cardioselective beta blocker.  - Last TSH was normal, 5.173 here, will check free T3, free T4.   ESRD: Note patient still makes urine. Contrast with CTAs present a nonzero risk, though this is in pursuit of work up for very important procedure.  - HD per nephrology. Really appreciate their assistance, plan to return to typical TTS schedule 11/11 as well.   Anemia of ESRD:  - Stable  PAD: s/p right femoral endarterectomy and fem-pop bypass Oct 2022, quiescent  currently.  - Antiplatelet, statin  Patrecia Pour, MD [facility] [email or web address] 10/20/2022, 11:37 AM

## 2022-10-20 NOTE — Progress Notes (Signed)
Mobility Specialist - Progress Note   10/20/22 1010  Mobility  Activity Ambulated with assistance in hallway  Level of Assistance Standby assist, set-up cues, supervision of patient - no hands on  Assistive Device None  Distance Ambulated (ft) 240 ft  Activity Response Tolerated well  Mobility Referral Yes  $Mobility charge 1 Mobility    Pre-mobility: 98%SpO2 During mobility: 98% SpO2 Post-mobility:100% SpO2  Pt received in bed and agreeable to mobility. Pt required x1 standing rest break d/t feeling SOB, with O2 being 98%. Pt was returned to EOB with all needs met.   Franki Monte  Mobility Specialist Please contact via Solicitor or Rehab office at [phone]

## 2022-10-20 NOTE — Progress Notes (Signed)
Expand All Collapse All  Subjective: Seen in room status more comfortable today after extra dialysis yesterday for volume overload.  Today dialysis normal schedule for him pending.  Stated he had shortness of breath after walking in hall  earlier today.  Resting in bed currently no chest pain or shortness of breath   Objective Vital signs in last 24 hours:       Vitals:    10/18/22 2333 10/18/22 2354 10/19/22 0412 10/19/22 0816  BP: 120/74   132/77 118/69  Pulse: 88   87 77  Resp: 16   20 18   Temp: 98.4 F (36.9 C) 98.4 F (36.9 C) 97.7 F (36.5 C) 98.4 F (36.9 C)  TempSrc: Oral   Oral Oral  SpO2: 94%   98% 96%  Weight:     79.6 kg    Height:            Weight change: -5.939 kg   Physical Exam: General: Thin elderly male chronically ill-appearing no acute distress Heart: RRR, 2/6 SEM, no rub or gallop Lungs: Decreased breath sound bases and right sided rales soft / nasal cannula oxygen /nonlabored breathing Abdomen: NABS, soft NTND no ascites Extremities: No significant pedal edema  dialysis Access: RUA AVF+ bruit     OP HD= [facility] T[facility] , 4 hr 85min,  UF P #4. EDW 79.5 kg 2K 2.5 CA, 180 dialyzer , Heparin 5000 units Mircera 30 mcg every 4 weeks but not given recently because of hemoglobins 12, 10.1, 10.2 Calcitriol 0.25 p.o. on HD Access RUA AVF   Stat  AM chest x-ray 11/10 /23 =-Unchanged CHF pattern.    Problem/Plan: SOB/DOE with pulmonary edema, three-vessel CAD, AOS / HO COPD =  Has HD 2.5 L uf  at [facility] on admit  11/09 ,,then yest 10/19/22 short of breath with continued chest x-ray showing CHF 2 L UF HD , plan again HD  today to keep on TTS schedule  ESRD -HD TTS, as above HD today  NSTEMI / Card Cath = Nl LVEF,severe three-vessel obstructive CAD, Sev AS= Card. following / consulting CT surgery.  Thought to be high risk for surgery, Dr. Darcey Nora  Severe aortic stenosis= Card following, CT surgery, Dr. Darcey Nora suggested possible TAVR History of  COPD= tobacco abuse currently, Mr. Devonshire thinks he "may stop smoking now" Anemia of CKD-Hgb 9.2, restart today 40 mcg Aranesp continue on schedule Aranesp weekly in hospital Secondary hyperparathyroidism - Corec  CA 9.3 .continue calcitriol/ phos 6.5 , on Vel phoro as binder        Ernest Haber, PA-C [facility] Bee[facility] [phone] 10/20/2022,3:33 PM  LOS: 3 days   Labs: Basic Metabolic Panel: Recent Labs  Lab 10/18/22 0318 10/18/22 1646 10/18/22 1650 10/19/22 0152 10/20/22 0105  NA 130*   < > 136 134* 131*  K 4.7   < > 3.6 4.1 4.4  CL 92*  --   --  94* 94*  CO2 24  --   --  28 27  GLUCOSE 173*  --   --  98 95  BUN 68*  --   --  36* 53*  CREATININE 9.06*  --   --  5.37* 7.32*  CALCIUM 8.2*  --   --  8.4* 8.4*  PHOS 7.4*  --   --  5.8* 6.5*   < > = values in this interval not displayed.   Liver Function Tests: Recent Labs  Lab 10/17/22 1912 10/18/22 0318 10/19/22 0152 10/20/22  0105  AST 40 47*  --   --   ALT 19 16  --   --   ALKPHOS 59 51  --   --   BILITOT 0.6 0.4  --   --   PROT 7.9 7.0  --   --   ALBUMIN 3.4* 3.0* 2.9* 2.8*   No results for input(s): "LIPASE", "AMYLASE" in the last 168 hours. No results for input(s): "AMMONIA" in the last 168 hours. CBC: Recent Labs  Lab 10/14/22 0355 10/17/22 1912 10/18/22 0318 10/18/22 1646 10/18/22 1650 10/19/22 0940 10/20/22 0105  WBC 7.4 13.0* 7.3  --   --  8.5 8.1  NEUTROABS  --  11.5*  --   --   --   --   --   HGB 9.5* 11.7* 9.6*   < > 8.8* 9.2* 9.2*  HCT 28.1* 34.6* 28.6*   < > 26.0* 27.9* 27.0*  MCV 99.3 98.6 99.0  --   --  100.0 98.5  PLT 236 377 186  --   --  209 183   < > = values in this interval not displayed.   Cardiac Enzymes: No results for input(s): "CKTOTAL", "CKMB", "CKMBINDEX", "TROPONINI" in the last 168 hours. CBG: No results for input(s): "GLUCAP" in the last 168 hours.  Studies/Results: CT CORONARY MORPH W/CTA COR W/SCORE W/CA W/CM &/OR WO/CM  Result Date:  10/20/2022 EXAM: OVER-READ INTERPRETATION  CT CHEST The following report is an over-read performed by radiologist Dr. Ruthann Cancer of [facility] [place] on 10/20/2022. This over-read does not include interpretation of cardiac or coronary anatomy or pathology. The coronary calcium score interpretation by the cardiologist is attached. COMPARISON:  None Available. FINDINGS: Cardiovascular: The heart is normal in size. Severe mitral annular and aortic valvular calcifications are noted. Ascending thoracic aorta is normal in size. Atherosclerotic calcification of the visualized thoracic aorta. Mediastinum/Nodes: Visualized esophagus and trachea within normal limits. No mediastinal lymphadenopathy. Lungs/Pleura: Subpleural tree-in-bud opacities in the lateral right lower lobe and perifissural right middle lobe. Partially visualized bibasilar subsegmental atelectasis with possible trace bilateral pleural effusions. Upper Abdomen: The visualized upper abdomen is within normal limits. Musculoskeletal: No acute osseous abnormality. Bilateral symmetric gynecomastia. IMPRESSION: 1. Scattered right middle and right lower lower lobe tree-in-bud opacities as could be seen with infectious/inflammatory etiology. 2. Partially visualized bibasilar subsegmental atelectasis with possible trace bilateral pleural effusions. 3. Severe mitral annular and aortic valvular calcifications. 4.  Aortic Atherosclerosis (ICD10-I70.0). Ruthann Cancer, MD [facility] Electronically Signed   By: Ruthann Cancer M.D.   On: 10/20/2022 06:50   DG CHEST PORT 1 VIEW  Result Date: 10/19/2022 CLINICAL DATA:  Shortness of breath.  Respiratory distress. EXAM: PORTABLE CHEST 1 VIEW COMPARISON:  10/17/2022 FINDINGS: Cardiac enlargement, stable. Aortic atherosclerosis. Pulmonary vascular congestion and mild interstitial edema is unchanged. Blunting of the costophrenic angles are identified  consistent with small effusions. No airspace consolidation. IMPRESSION: Unchanged CHF pattern. Electronically Signed   By: Kerby Moors M.D.   On: 10/19/2022 10:07   CARDIAC CATHETERIZATION  Result Date: 10/18/2022   Dist RCA lesion is 60% stenosed.   Mid LM to Dist LM lesion is 70% stenosed.   Ost LAD to Prox LAD lesion is 80% stenosed.   Prox LAD to Mid LAD lesion is 85% stenosed.   Lat 1st Diag lesion is 90% stenosed.   1st Diag lesion is 70% stenosed.   Ramus lesion is 60% stenosed.   Ost Cx to Prox Cx  lesion is 99% stenosed.   LV end diastolic pressure is mildly elevated.   Hemodynamic findings consistent with moderate pulmonary hypertension.   There is severe aortic valve stenosis. Severe 3 vessel obstructive CAD Severe aortic stenosis. Mean gradient 32 mm Hg. AVA 0.9 cm squared with index 0.46 Mildly elevated LV filling pressures. LVEDP 18 mm Hg. PCWP mean 19 mm Hg Moderate pulmonary HTN. PAP 54/21 mean 36 mm Hg Normal cardiac output. By Kathlen Brunswick 7.58 lL/min with index 3.92. by Thermodilution output 3.28 L/min with index 3.28. Plan; will consult CT surgery. I don't think there are any PCI options with severely calcified vessels, diffuse disease and trifurcation disease in LAD/diagonal distribution. Will review options  with heart team approach.   Medications:  sodium chloride     cefTRIAXone (ROCEPHIN)  IV      amLODipine  2.5 mg Oral QHS   aspirin EC  81 mg Oral Daily   atorvastatin  80 mg Oral Daily   azithromycin  500 mg Oral Daily   Followed by   Derrill Memo ON 10/21/2022] azithromycin  250 mg Oral Daily   calcitRIOL  0.25 mcg Oral Q T,Th,Sa-HD   darbepoetin (ARANESP) injection - DIALYSIS  40 mcg Subcutaneous Q Sat-1800   fluticasone furoate-vilanterol  1 puff Inhalation Daily   metoprolol succinate  25 mg Oral Daily   sodium chloride flush  3 mL Intravenous Q12H   sucroferric oxyhydroxide  500 mg Oral TID WC   tamsulosin  0.4 mg Oral QHS   umeclidinium bromide  1 puff Inhalation Daily

## 2022-10-20 NOTE — Progress Notes (Signed)
Oozing to new dressing noted.  Melina Copa PA notified.  Stated she would come assess patient.  1135: Order received to stop IV heparin and to hold pressure for 20 minutes.  Pressure applied to left groin at 1140 to 1200.  Site continues to ooze.  Dayna Dunn PA (on department) stated to hold pressure until she gets in touch with DR Hilty .    1220: stopped manual pressure and placed pressure dressing per Melina Copa PA.  Via secure chat updated Dr Jonnie Finner, Dr. Bonner Puna, and  Ernest Haber, PA of happenings. Dr. Jonnie Finner states ok to send patient to HD.  HD updated for transport.  New IV and PO antibiotics to be started after today's HD treatment per  Ernest Haber, PA.

## 2022-10-20 NOTE — Progress Notes (Signed)
Received patient in bed to unit. Alert and orientedx4 Informed consent signed and in chart.   Treatment initiated: 1416  Treatment completed: 8185   Patient tolerated well. Transported back to the room Alert, without acute distress. Hand-off given to patient's nurse.   Access used: avf using 15G needle Access issues:  Dressing: surgifoam, 4x4 paper tape Total UF removed: 1.5L Medication(s) given: none  Post HD VS: see table below  Post HD weight: 77.4 kg   10/20/22 1806  Vitals  BP (!) 146/75  MAP (mmHg) 95  BP Location Left Arm  BP Method Automatic  Patient Position (if appropriate) Lying  Pulse Rate 71  Pulse Rate Source Monitor  ECG Heart Rate 73  Resp (!) 22  Oxygen Therapy  SpO2 100 %  O2 Device Room Air  During Treatment Monitoring  Intra-Hemodialysis Comments See progress note

## 2022-10-20 NOTE — Progress Notes (Signed)
Rounding Note    Patient Name: Kerry Underwood Date of Encounter: 10/20/2022  [place] Cardiologist: Freada Bergeron, MD    Subjective   71 year old gentleman with a history of PAD, hypertension, COPD, ESRD, moderate aortic stenosis/mitral stenosis, coronary artery disease.  He was originally admitted to [facility] on November 4 with acute hypoxic respiratory failure in the setting of CHF and a COPD exacerbation.  Found to have normal LVEF.  He had moderately elevated pulmonary pressures.  Heart catheterization November night, 2023 revealed severe three-vessel coronary artery disease, moderate - severe aortic stenosis.  Was seen by Dr. Darcey Nora from [facility].   He was thought to be a high risk for surgery.  Dr. Darcey Nora suggested possible TAVR.  Still smoking  Advised to stop     Inpatient Medications    Scheduled Meds:  amLODipine  2.5 mg Oral QHS   aspirin EC  81 mg Oral Daily   atorvastatin  80 mg Oral Daily   calcitRIOL  0.25 mcg Oral Q T,Th,Sa-HD   fluticasone furoate-vilanterol  1 puff Inhalation Daily   metoprolol succinate  25 mg Oral Daily   sodium chloride flush  3 mL Intravenous Q12H   sucroferric oxyhydroxide  500 mg Oral TID WC   tamsulosin  0.4 mg Oral QHS   umeclidinium bromide  1 puff Inhalation Daily   Continuous Infusions:  sodium chloride     heparin 1,500 Units/hr (10/20/22 0430)   PRN Meds: sodium chloride, acetaminophen **OR** acetaminophen, albuterol, heparin, lidocaine (PF), lidocaine-prilocaine, morphine injection, ondansetron **OR** ondansetron (ZOFRAN) IV, oxyCODONE, pentafluoroprop-tetrafluoroeth, sodium chloride flush   Vital Signs    Vitals:   10/19/22 2040 10/19/22 2339 10/20/22 0456 10/20/22 0812  BP: 125/76 121/64 121/68 129/76  Pulse: 77 73 75 77  Resp: 18 17 18 16   Temp: 97.9 F (36.6 C) 99.3 F (37.4 C) 98.1 F (36.7 C) 98.5 F (36.9 C)  TempSrc: Oral Oral Oral Oral  SpO2: 95% 97% 100% 99%  Weight:    78.6 kg   Height:        Intake/Output Summary (Last 24 hours) at 10/20/2022 0820 Last data filed at 10/20/2022 0502 Gross per 24 hour  Intake 646.32 ml  Output 2500 ml  Net -1853.68 ml      10/20/2022    4:56 AM 10/19/2022    5:00 PM 10/19/2022    2:28 PM  Last 3 Weights  Weight (lbs) 173 lb 4.5 oz 172 lb 13.5 oz 177 lb 0.5 oz  Weight (kg) 78.6 kg 78.4 kg 80.3 kg      Telemetry     NSR  Personally Reviewed  ECG     - Personally Reviewed  Physical Exam   GEN:  middle age male, appears older thanstated age.  .   Neck: No JVD Cardiac: RRR, . + AS murmur   Respiratory: decreased breath sounds bilat c/w COPD  GI: Soft, nontender, non-distended  MS: No edema; dialysis fistula in right upper arm  Neuro:  Nonfocal  Psych: Normal affect   Labs    High Sensitivity Troponin:   Recent Labs  Lab 10/17/22 1912 10/17/22 2104 10/17/22 2344 10/18/22 0318  TROPONINIHS 226* 1,286* 3,927* 6,053*     Chemistry Recent Labs  Lab 10/14/22 0355 10/14/22 0355 10/17/22 1912 10/18/22 0318 10/18/22 1646 10/18/22 1650 10/19/22 0152 10/20/22 0105  NA 136  --  133* 130*   < > 136 134* 131*  K 4.1  --  5.1 4.7   < >  3.6 4.1 4.4  CL 96*  --  91* 92*  --   --  94* 94*  CO2 27  --  25 24  --   --  28 27  GLUCOSE 110*  --  230* 173*  --   --  98 95  BUN 29*  --  62* 68*  --   --  36* 53*  CREATININE 6.55*  --  8.75* 9.06*  --   --  5.37* 7.32*  CALCIUM 8.6*  --  8.3* 8.2*  --   --  8.4* 8.4*  MG 1.8  --   --  2.0  --   --   --   --   PROT  --   --  7.9 7.0  --   --   --   --   ALBUMIN  --    < > 3.4* 3.0*  --   --  2.9* 2.8*  AST  --   --  40 47*  --   --   --   --   ALT  --   --  19 16  --   --   --   --   ALKPHOS  --   --  59 51  --   --   --   --   BILITOT  --   --  0.6 0.4  --   --   --   --   GFRNONAA 8*  --  6* 6*  --   --  11* 7*  ANIONGAP 13  --  17* 14  --   --  12 10   < > = values in this interval not displayed.    Lipids  Recent Labs  Lab 10/19/22 0152   CHOL 132  TRIG 88  HDL 37*  LDLCALC 77  CHOLHDL 3.6    Hematology Recent Labs  Lab 10/18/22 0318 10/18/22 1646 10/18/22 1650 10/19/22 0940 10/20/22 0105  WBC 7.3  --   --  8.5 8.1  RBC 2.89*  --   --  2.79* 2.74*  HGB 9.6*   < > 8.8* 9.2* 9.2*  HCT 28.6*   < > 26.0* 27.9* 27.0*  MCV 99.0  --   --  100.0 98.5  MCH 33.2  --   --  33.0 33.6  MCHC 33.6  --   --  33.0 34.1  RDW 14.7  --   --  14.9 15.0  PLT 186  --   --  209 183   < > = values in this interval not displayed.   Thyroid  Recent Labs  Lab 10/20/22 0105  TSH 5.173*    BNP Recent Labs  Lab 10/17/22 1912  BNP 1,821.0*    DDimer  Recent Labs  Lab 10/18/22 0318  DDIMER 1.12*     Radiology    CT CORONARY MORPH W/CTA COR W/SCORE W/CA W/CM &/OR WO/CM  Result Date: 10/20/2022 EXAM: OVER-READ INTERPRETATION  CT CHEST The following report is an over-read performed by radiologist Dr. Ruthann Cancer of [facility], [place] on 10/20/2022. This over-read does not include interpretation of cardiac or coronary anatomy or pathology. The coronary calcium score interpretation by the cardiologist is attached. COMPARISON:  None Available. FINDINGS: Cardiovascular: The heart is normal in size. Severe mitral annular and aortic valvular calcifications are noted. Ascending thoracic aorta is normal in size. Atherosclerotic calcification of the visualized thoracic aorta. Mediastinum/Nodes: Visualized esophagus and trachea within normal limits. No mediastinal  lymphadenopathy. Lungs/Pleura: Subpleural tree-in-bud opacities in the lateral right lower lobe and perifissural right middle lobe. Partially visualized bibasilar subsegmental atelectasis with possible trace bilateral pleural effusions. Upper Abdomen: The visualized upper abdomen is within normal limits. Musculoskeletal: No acute osseous abnormality. Bilateral symmetric gynecomastia. IMPRESSION: 1. Scattered right middle and right lower lower lobe tree-in-bud opacities as could  be seen with infectious/inflammatory etiology. 2. Partially visualized bibasilar subsegmental atelectasis with possible trace bilateral pleural effusions. 3. Severe mitral annular and aortic valvular calcifications. 4.  Aortic Atherosclerosis (ICD10-I70.0). Ruthann Cancer, MD [facility] Electronically Signed   By: Ruthann Cancer M.D.   On: 10/20/2022 06:50   DG CHEST PORT 1 VIEW  Result Date: 10/19/2022 CLINICAL DATA:  Shortness of breath.  Respiratory distress. EXAM: PORTABLE CHEST 1 VIEW COMPARISON:  10/17/2022 FINDINGS: Cardiac enlargement, stable. Aortic atherosclerosis. Pulmonary vascular congestion and mild interstitial edema is unchanged. Blunting of the costophrenic angles are identified consistent with small effusions. No airspace consolidation. IMPRESSION: Unchanged CHF pattern. Electronically Signed   By: Kerby Moors M.D.   On: 10/19/2022 10:07   CARDIAC CATHETERIZATION  Result Date: 10/18/2022   Dist RCA lesion is 60% stenosed.   Mid LM to Dist LM lesion is 70% stenosed.   Ost LAD to Prox LAD lesion is 80% stenosed.   Prox LAD to Mid LAD lesion is 85% stenosed.   Lat 1st Diag lesion is 90% stenosed.   1st Diag lesion is 70% stenosed.   Ramus lesion is 60% stenosed.   Ost Cx to Prox Cx lesion is 99% stenosed.   LV end diastolic pressure is mildly elevated.   Hemodynamic findings consistent with moderate pulmonary hypertension.   There is severe aortic valve stenosis. Severe 3 vessel obstructive CAD Severe aortic stenosis. Mean gradient 32 mm Hg. AVA 0.9 cm squared with index 0.46 Mildly elevated LV filling pressures. LVEDP 18 mm Hg. PCWP mean 19 mm Hg Moderate pulmonary HTN. PAP 54/21 mean 36 mm Hg Normal cardiac output. By Kathlen Brunswick 7.58 lL/min with index 3.92. by Thermodilution output 3.28 L/min with index 3.28. Plan; will consult CT surgery. I don't think there are any PCI options with severely calcified vessels, diffuse disease and  trifurcation disease in LAD/diagonal distribution. Will review options  with heart team approach.   ECHOCARDIOGRAM LIMITED  Result Date: 10/18/2022    ECHOCARDIOGRAM LIMITED REPORT   Patient Name:   KIKO RIPP Date of Exam: 10/18/2022 Medical Rec #:  [id number]    Height:       65.0 in Accession #:    [id number]   Weight:       188.5 lb Date of Birth:  1951-07-29    BSA:          1.929 m Patient Age:    15 years     BP:           125/63 mmHg Patient Gender: M            HR:           76 bpm. Exam Location:  [facility] Procedure: Limited Echo Indications:    NSTEMI  History:        Patient has prior history of Echocardiogram examinations, most                 recent 10/13/2022. CHF, Acute MI, COPD; Risk                 Factors:Hypertension.  Sonographer:  Wenda Low Referring Phys: [id number] [place]  1. Limited Echo to assess change in LVEF. Doppler was not performed.  2. Left ventricular ejection fraction, by estimation, is 70 to 75%. The left ventricle has hyperdynamic function. The left ventricle has no regional wall motion abnormalities. There is mild left ventricular hypertrophy.  3. Severe mitral annular calcification.  4. The inferior vena cava is normal in size with greater than 50% respiratory variability, suggesting right atrial pressure of 3 mmHg.Limited FINDINGS  Left Ventricle: Left ventricular ejection fraction, by estimation, is 70 to 75%. The left ventricle has hyperdynamic function. The left ventricle has no regional wall motion abnormalities. There is mild left ventricular hypertrophy. Mitral Valve: Severe mitral annular calcification. Tricuspid Valve: The tricuspid valve is not well visualized. Aortic Valve: The aortic valve was not well visualized. Pulmonic Valve: The pulmonic valve was not well visualized. Venous: The inferior vena cava is normal in size with greater than 50% respiratory variability, suggesting right atrial pressure of 3 mmHg. LEFT VENTRICLE PLAX  2D LVIDd:         4.60 cm LVIDs:         2.80 cm LV PW:         1.30 cm LV IVS:        1.30 cm  LEFT ATRIUM         Index LA diam:    4.60 cm 2.38 cm/m Vishnu Priya Mallipeddi Electronically signed by Lorelee Cover Mallipeddi Signature Date/Time: 10/18/2022/11:37:44 AM    Final     Cardiac Studies     Patient Profile     71 y.o. male  with COPD, ESRD, AS,   Assessment & Plan    1.  Moderate to severe aortic stenosis: In the process of getting a TAVR work-up.  He also has severe lung disease and severe coronary artery disease.  2.  COPD: He had 2 respiratory rest over the past month. I strongly advised him to quit smoking.  3.  Severe coronary artery disease: He has tight left main stenosis of approximately 70%.  He has significant CAD involving the LAD, RCA, circumflex.  He has been seen by T CTS and is poor candidate coronary artery bypass grafting. Dr. Darcey Nora suggested TAVR for relief of his symptoms and did not suggest CABG  due to high risk   4.  ESRD :  on HD       For questions or updates, please contact Elizabeth Please consult [email or web address] for contact info under        Signed, Mertie Moores, MD  10/20/2022, 8:20 AM

## 2022-10-20 NOTE — Progress Notes (Signed)
Reassessed left groin.  Some blood noted on dressing.  Spoke to [employer], [place].  She states not to apply pressure dressing and she will come assess site if just a few minutes.

## 2022-10-21 DIAGNOSIS — I2510 Atherosclerotic heart disease of native coronary artery without angina pectoris: Secondary | ICD-10-CM

## 2022-10-21 DIAGNOSIS — J9601 Acute respiratory failure with hypoxia: Secondary | ICD-10-CM | POA: Diagnosis not present

## 2022-10-21 DIAGNOSIS — I350 Nonrheumatic aortic (valve) stenosis: Secondary | ICD-10-CM | POA: Diagnosis not present

## 2022-10-21 DIAGNOSIS — I25118 Atherosclerotic heart disease of native coronary artery with other forms of angina pectoris: Secondary | ICD-10-CM | POA: Diagnosis not present

## 2022-10-21 LAB — RENAL FUNCTION PANEL
Albumin: 2.6 g/dL — ABNORMAL LOW (ref 3.5–5.0)
Anion gap: 12 (ref 5–15)
BUN: 31 mg/dL — ABNORMAL HIGH (ref 8–23)
CO2: 29 mmol/L (ref 22–32)
Calcium: 8.2 mg/dL — ABNORMAL LOW (ref 8.9–10.3)
Chloride: 94 mmol/L — ABNORMAL LOW (ref 98–111)
Creatinine, Ser: 4.62 mg/dL — ABNORMAL HIGH (ref 0.61–1.24)
GFR, Estimated: 13 mL/min — ABNORMAL LOW (ref >60)
Glucose, Bld: 81 mg/dL (ref 70–99)
Phosphorus: 4.9 mg/dL — ABNORMAL HIGH (ref 2.5–4.6)
Potassium: 4.1 mmol/L (ref 3.5–5.1)
Sodium: 135 mmol/L (ref 135–145)

## 2022-10-21 LAB — CBC
HCT: 26 % — ABNORMAL LOW (ref 39.0–52.0)
Hemoglobin: 8.9 g/dL — ABNORMAL LOW (ref 13.0–17.0)
MCH: 33.3 pg (ref 26.0–34.0)
MCHC: 34.2 g/dL (ref 30.0–36.0)
MCV: 97.4 fL (ref 80.0–100.0)
Platelets: 147 10*3/uL — ABNORMAL LOW (ref 150–400)
RBC: 2.67 MIL/uL — ABNORMAL LOW (ref 4.22–5.81)
RDW: 14.6 % (ref 11.5–15.5)
WBC: 6.2 10*3/uL (ref 4.0–10.5)
nRBC: 0 % (ref 0.0–0.2)

## 2022-10-21 MED ORDER — PREDNISONE 20 MG PO TABS
40.0000 mg | ORAL_TABLET | Freq: Every day | ORAL | Status: DC
  Administered 2022-10-21 – 2022-10-24 (×4): 40 mg via ORAL
  Filled 2022-10-21 (×4): qty 2

## 2022-10-21 NOTE — Progress Notes (Addendum)
Subjective: Seen in room, no current complaints except shortness of breath when walks.  Said tolerated dialysis yesterday on schedule 1.5 L UF (  Objective Vital signs in last 24 hours: Vitals:   10/20/22 2257 10/21/22 0528 10/21/22 0909 10/21/22 0924  BP: 133/68 122/70  111/66  Pulse:  74  75  Resp:  18    Temp:  98.7 F (37.1 C)    TempSrc:  Oral    SpO2:  98% 98%   Weight:  77.7 kg    Height:       Weight change: -1.7 kg  Physical Exam: General: Thin elderly male chronically ill-appearing no acute distress Heart: RRR, 2/6 SEM, no rub or gallop Lungs: Decreased breath sound bases bilaterally, otherwise clear just had nebulizer treatment, nasal cannula oxygen /nonlabored breathing Abdomen: NABS, soft NTND no ascites Extremities: No significant pedal edema  dialysis Access: RUA AVF+ bruit     OP HD= [facility] TTS , 4 hr 57min,  UF P #4. EDW 79.5 kg 2K 2.5 CA, 180 dialyzer , Heparin 5000 units Mircera 30 mcg every 4 weeks but not given recently because of hemoglobins 12, 10.1, 10.2 Calcitriol 0.25 p.o. on HD Access RUA AVF   Stat  AM chest x-ray 11/10 /23 =-Unchanged CHF pattern.    Problem/Plan: SOB/DOE with pulmonary edema, three-vessel CAD, AOS / HO COPD =  Has HD 2.5 L uf  at [facility] on admit  11/09 ,,then 11/10 consecutive day HD secondary to volume overload on CXR /sob in setting of severe three-vessel coronary disease and severe AS)yest  HD  on TTS schedule 1.5 l uf , now 2 l below EDW.  BP stable this a.m. 111/66 ESRD -HD TTS, as above HD yesterday Saturday on schedule NSTEMI / Card Cath = Nl LVEF,severe three-vessel obstructive CAD, Sev AS= Card. following / consulting CT surgery.  Thought to be high risk for CABG  Severe aortic stenosis= Card following, CT surgery, Dr. Darcey Nora suggested possible TAVR History of COPD= tobacco abuse currently, Mr. Mcilhenny thinks he "may stop smoking now" Anemia of CKD-Hgb 8.9< 9.2, restart ESA 10/20/22  40 mcg Aranesp  continue on schedule  weekly in hospital Secondary hyperparathyroidism - Corec  CA 9.3 .continue calcitriol/ phos 6.5 , on Vel phoro as binder  Ernest Haber, PA-C [facility] Beeper [phone] 10/21/2022,12:11 PM  LOS: 4 days   Labs: Basic Metabolic Panel: Recent Labs  Lab 10/19/22 0152 10/20/22 0105 10/21/22 0114  NA 134* 131* 135  K 4.1 4.4 4.1  CL 94* 94* 94*  CO2 28 27 29   GLUCOSE 98 95 81  BUN 36* 53* 31*  CREATININE 5.37* 7.32* 4.62*  CALCIUM 8.4* 8.4* 8.2*  PHOS 5.8* 6.5* 4.9*   Liver Function Tests: Recent Labs  Lab 10/17/22 1912 10/18/22 0318 10/19/22 0152 10/20/22 0105 10/21/22 0114  AST 40 47*  --   --   --   ALT 19 16  --   --   --   ALKPHOS 59 51  --   --   --   BILITOT 0.6 0.4  --   --   --   PROT 7.9 7.0  --   --   --   ALBUMIN 3.4* 3.0* 2.9* 2.8* 2.6*   No results for input(s): "LIPASE", "AMYLASE" in the last 168 hours. No results for input(s): "AMMONIA" in the last 168 hours. CBC: Recent Labs  Lab 10/17/22 1912 10/18/22 0318 10/18/22 1646 10/19/22 0940 10/20/22 0105 10/21/22 0114  WBC 13.0* 7.3  --  8.5 8.1 6.2  NEUTROABS 11.5*  --   --   --   --   --   HGB 11.7* 9.6*   < > 9.2* 9.2* 8.9*  HCT 34.6* 28.6*   < > 27.9* 27.0* 26.0*  MCV 98.6 99.0  --  100.0 98.5 97.4  PLT 377 186  --  209 183 147*   < > = values in this interval not displayed.   Cardiac Enzymes: No results for input(s): "CKTOTAL", "CKMB", "CKMBINDEX", "TROPONINI" in the last 168 hours. CBG: No results for input(s): "GLUCAP" in the last 168 hours.  Medications:  sodium chloride     cefTRIAXone (ROCEPHIN)  IV 1 g (10/20/22 1856)    amLODipine  2.5 mg Oral QHS   aspirin EC  81 mg Oral Daily   atorvastatin  80 mg Oral Daily   azithromycin  250 mg Oral Daily   calcitRIOL  0.25 mcg Oral Q T,Th,Sa-HD   darbepoetin (ARANESP) injection - DIALYSIS  40 mcg Subcutaneous Q Sat-1800   fluticasone furoate-vilanterol  1 puff Inhalation Daily   metoprolol  succinate  25 mg Oral Daily   predniSONE  40 mg Oral Q breakfast   sodium chloride flush  3 mL Intravenous Q12H   sucroferric oxyhydroxide  500 mg Oral TID WC   tamsulosin  0.4 mg Oral QHS   umeclidinium bromide  1 puff Inhalation Daily

## 2022-10-21 NOTE — Evaluation (Signed)
Occupational Therapy Evaluation Patient Details Name: Kerry Underwood MRN: [id number] DOB: [birth year] Today's Date: 10/21/2022   History of Present Illness Pt is a 71 y.o. male admitted 10/17/22 with DOE, presyncope, wheezing. Workup for acute hypoxic respiratory failure secondary to HFpEF/pulmonary edema, NSTEMI. [place] 11/9 with severe 3-vessel disease, severe AS. Deemed not a candidate for surgical AVR with CABG; may be candidate for TAVR. Bedrest 11/11 for cath site oozing, verbal order cleared by cardiology 11/12. PMH includes CAD, HLD, HF, COPD, PAD, ESRD, viral hepatitis.   Clinical Impression   PTA patient independent. Admitted for for above and limited by problem list below, including decreased activity tolerance.  Patient currently on 1L O2 during session, SpO2 maintained.  Completing ADLs and mobility with supervision with education on Spo2 line mgmt and safety.  Initiated education on energy conservation. Pt with poor health literacy, reports unable to read or write.  Will benefit from acute OT services to optimize tolerance and safety but anticipate no further needs after dc home.      Recommendations for follow up therapy are one component of a multi-disciplinary discharge planning process, led by the attending physician.  Recommendations may be updated based on patient status, additional functional criteria and insurance authorization.   Follow Up Recommendations  No OT follow up    Assistance Recommended at Discharge PRN  Patient can return home with the following      Functional Status Assessment     Equipment Recommendations  None recommended by OT    Recommendations for Other Services       Precautions / Restrictions Precautions Precautions: Other (comment) Precaution Comments: watch o2 Restrictions Weight Bearing Restrictions: No      Mobility Bed Mobility               General bed mobility comments: EOB upon entry    Transfers Overall transfer level:  Needs assistance   Transfers: Sit to/from Stand Sit to Stand: Supervision           General transfer comment: line mgmt      Balance Overall balance assessment: Mild deficits observed, not formally tested                                         ADL either performed or assessed with clinical judgement   ADL Overall ADL's : Needs assistance/impaired     Grooming: Supervision/safety;Standing           Upper Body Dressing : Sitting;Set up   Lower Body Dressing: Supervision/safety;Sit to/from stand   Toilet Transfer: Supervision/safety;Ambulation   Toileting- Clothing Manipulation and Hygiene: Supervision/safety;Sit to/from stand   Tub/ Shower Transfer: Walk-in shower;Supervision/safety;Ambulation Tub/Shower Transfer Details (indicate cue type and reason): simulated Functional mobility during ADLs: Supervision/safety General ADL Comments: assist for O2 line mgmt     Vision   Vision Assessment?: No apparent visual deficits     Perception     Praxis      Pertinent Vitals/Pain Pain Assessment Pain Assessment: No/denies pain     Hand Dominance Right   Extremity/Trunk Assessment Upper Extremity Assessment Upper Extremity Assessment: Overall WFL for tasks assessed   Lower Extremity Assessment Lower Extremity Assessment: Defer to PT evaluation       Communication Communication Communication: No difficulties   Cognition Arousal/Alertness: Awake/alert Behavior During Therapy: WFL for tasks assessed/performed Overall Cognitive Status: Within Functional Limits for tasks assessed  General Comments  pt educated on O2 and energy conservation, pt will benefit from further education due to poor health literacy (reports he is unable to read/write)    Exercises     Shoulder Instructions      Home Living Family/patient expects to be discharged to:: Private residence Living Arrangements:  Alone Available Help at Discharge:  (none) Type of Home: Mobile home Home Access: Stairs to enter Entrance Stairs-Number of Steps: 1 Entrance Stairs-Rails: Left Home Layout: One level     Bathroom Shower/Tub: Occupational psychologist: Standard     Home Equipment: None          Prior Functioning/Environment Prior Level of Function : Independent/Modified Independent;Driving               ADLs Comments: 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, theatre/television/film Problem List: Decreased strength;Decreased activity tolerance;Decreased knowledge of use of DME or AE;Decreased knowledge of precautions;Cardiopulmonary status limiting activity      OT Treatment/Interventions: Self-care/ADL training;Therapeutic exercise;DME and/or AE instruction;Therapeutic activities;Patient/family education;Balance training;Energy conservation    OT Goals(Current goals can be found in the care plan section) Acute Rehab OT Goals Patient Stated Goal: get better OT Goal Formulation: With patient Time For Goal Achievement: 11/04/22 Potential to Achieve Goals: Good  OT Frequency: Min 2X/week    Co-evaluation              AM-PAC OT "6 Clicks" Daily Activity     Outcome Measure Help from another person eating meals?: None Help from another person taking care of personal grooming?: A Little Help from another person toileting, which includes using toliet, bedpan, or urinal?: A Little Help from another person bathing (including washing, rinsing, drying)?: A Little Help from another person to put on and taking off regular upper body clothing?: A Little Help from another person to put on and taking off regular lower body clothing?: A Little 6 Click Score: 19   End of Session Equipment Utilized During Treatment: Oxygen Nurse Communication: Mobility status  Activity Tolerance: Patient tolerated treatment well Patient left: Other (comment) (with PT)  OT Visit Diagnosis: Other abnormalities of gait and  mobility (R26.89)                Time: 2751-7001 OT Time Calculation (min): 8 min Charges:  OT General Charges $OT Visit: 1 Visit OT Evaluation $OT Eval Low Complexity: [place], OT Acute Rehabilitation Services Office [phone]   [name] 10/21/2022, 2:45 PM

## 2022-10-21 NOTE — Evaluation (Signed)
Physical Therapy Evaluation Patient Details Name: Kerry Underwood MRN: [id number] DOB: September 11, 1951 Today's Date: 10/21/2022  History of Present Illness  Pt is a 71 y.o. male admitted 10/17/22 with DOE, presyncope, wheezing. Workup for acute hypoxic respiratory failure secondary to HFpEF/pulmonary edema, NSTEMI. [place] 11/9 with severe 3-vessel disease, severe AS. Deemed not a candidate for surgical AVR with CABG; may be candidate for TAVR. Bedrest 11/11 for cath site oozing, verbal order cleared by cardiology 11/12. PMH includes CAD, HLD, HF, COPD, PAD, ESRD, viral hepatitis.  Clinical Impression  Pt doing well with mobility. If pt is to have TAVR will need six minute walk test but otherwise likely will not need much more in the way of PT. Should be able to return home.        Recommendations for follow up therapy are one component of a multi-disciplinary discharge planning process, led by the attending physician.  Recommendations may be updated based on patient status, additional functional criteria and insurance authorization.  Follow Up Recommendations No PT follow up      Assistance Recommended at Discharge None  Patient can return home with the following       Equipment Recommendations None recommended by PT  Recommendations for Other Services       Functional Status Assessment Patient has had a recent decline in their functional status and demonstrates the ability to make significant improvements in function in a reasonable and predictable amount of time.     Precautions / Restrictions Precautions Precautions: Other (comment) Precaution Comments: watch o2 Restrictions Weight Bearing Restrictions: No      Mobility  Bed Mobility               General bed mobility comments: EOB upon entry    Transfers Overall transfer level: Needs assistance Equipment used: None Transfers: Sit to/from Stand Sit to Stand: Supervision           General transfer comment: safety and  lines    Ambulation/Gait Ambulation/Gait assistance: Supervision Gait Distance (Feet): 350 Feet Assistive device: None Gait Pattern/deviations: Step-through pattern Gait velocity: adequate Gait velocity interpretation: >2.62 ft/sec, indicative of community ambulatory   General Gait Details: Assist to monitor vitals and for safety.  Stairs            Wheelchair Mobility    Modified Rankin (Stroke Patients Only)       Balance Overall balance assessment: Mild deficits observed, not formally tested                                           Pertinent Vitals/Pain Pain Assessment Pain Assessment: No/denies pain    Home Living Family/patient expects to be discharged to:: Private residence Living Arrangements: Alone Available Help at Discharge:  (none) Type of Home: Mobile home Home Access: Stairs to enter Entrance Stairs-Rails: Left Entrance Stairs-Number of Steps: 1   Home Layout: One level Home Equipment: None      Prior Function Prior Level of Function : Independent/Modified Independent;Driving             Mobility Comments: No assistive device ADLs Comments: reports works [employer]   Dominant Hand: Right    Extremity/Trunk Assessment   Upper Extremity Assessment Upper Extremity Assessment: Defer to OT evaluation    Lower Extremity Assessment Lower Extremity Assessment: Overall WFL for tasks assessed  Communication   Communication: No difficulties  Cognition Arousal/Alertness: Awake/alert Behavior During Therapy: WFL for tasks assessed/performed Overall Cognitive Status: Within Functional Limits for tasks assessed                                          General Comments General comments (skin integrity, edema, etc.): SpO2 98% on RA with amb. Dyspnea 2/4 with amb    Exercises     Assessment/Plan    PT Assessment Patient needs continued PT services  PT Problem List  Decreased activity tolerance;Decreased mobility       PT Treatment Interventions Gait training;Functional mobility training;Patient/family education;Therapeutic activities    PT Goals (Current goals can be found in the Care Plan section)  Acute Rehab PT Goals Patient Stated Goal: return home PT Goal Formulation: With patient Time For Goal Achievement: 10/28/22 Potential to Achieve Goals: Good    Frequency Min 2X/week     Co-evaluation               AM-PAC PT "6 Clicks" Mobility  Outcome Measure Help needed turning from your back to your side while in a flat bed without using bedrails?: None Help needed moving from lying on your back to sitting on the side of a flat bed without using bedrails?: None Help needed moving to and from a bed to a chair (including a wheelchair)?: A Little Help needed standing up from a chair using your arms (e.g., wheelchair or bedside chair)?: A Little Help needed to walk in hospital room?: A Little Help needed climbing 3-5 steps with a railing? : A Little 6 Click Score: 20    End of Session   Activity Tolerance: Patient tolerated treatment well Patient left: in bed;with call bell/phone within reach Nurse Communication: Mobility status PT Visit Diagnosis: Other abnormalities of gait and mobility (R26.89)    Time: 4401-0272 PT Time Calculation (min) (ACUTE ONLY): 10 min   Charges:   PT Evaluation $PT Eval Low Complexity: 1 Low          [place] [facility] [place] 10/21/2022, 4:25 PM

## 2022-10-21 NOTE — Progress Notes (Signed)
OT Cancellation Note  Patient Details Name: Kerry Underwood MRN: [id number] DOB: 1951/07/28   Cancelled Treatment:    Reason Eval/Treat Not Completed: Active bedrest order due to bleeding at cath site.  Will follow and see as able/appropriate.   Jolaine Artist, OT Acute Rehabilitation Services Office [phone]   [name] 10/21/2022, 7:41 AM

## 2022-10-21 NOTE — Progress Notes (Signed)
Rounding Note    Patient Name: Kerry Underwood Date of Encounter: 10/21/2022  [place] Cardiologist: Freada Bergeron, MD    Subjective   71 year old gentleman with a history of PAD, hypertension, COPD, ESRD, moderate aortic stenosis/mitral stenosis, coronary artery disease.  He was originally admitted to [facility] on November 4 with acute hypoxic respiratory failure in the setting of CHF and a COPD exacerbation.  Found to have normal LVEF.  He had moderately elevated pulmonary pressures.  Heart catheterization November night, 2023 revealed severe three-vessel coronary artery disease, moderate - severe aortic stenosis.  Was seen by Dr. Darcey Nora from [place].   He was thought to be a high risk for surgery.  Dr. Darcey Nora suggested possible TAVR.  Still smoking  Advised to stop   Had a slow venous groin ooz yesterday  Heparin was stopped for a day  Groin looks stable now Will restart heparin today      Inpatient Medications    Scheduled Meds:  amLODipine  2.5 mg Oral QHS   aspirin EC  81 mg Oral Daily   atorvastatin  80 mg Oral Daily   azithromycin  250 mg Oral Daily   calcitRIOL  0.25 mcg Oral Q T,Th,Sa-HD   darbepoetin (ARANESP) injection - DIALYSIS  40 mcg Subcutaneous Q Sat-1800   fluticasone furoate-vilanterol  1 puff Inhalation Daily   metoprolol succinate  25 mg Oral Daily   predniSONE  40 mg Oral Q breakfast   sodium chloride flush  3 mL Intravenous Q12H   sucroferric oxyhydroxide  500 mg Oral TID WC   tamsulosin  0.4 mg Oral QHS   umeclidinium bromide  1 puff Inhalation Daily   Continuous Infusions:  sodium chloride     cefTRIAXone (ROCEPHIN)  IV 1 g (10/20/22 1856)   PRN Meds: sodium chloride, acetaminophen **OR** acetaminophen, albuterol, heparin, lidocaine (PF), lidocaine-prilocaine, morphine injection, ondansetron **OR** ondansetron (ZOFRAN) IV, oxyCODONE, pentafluoroprop-tetrafluoroeth, sodium chloride flush   Vital Signs     Vitals:   10/20/22 2257 10/21/22 0528 10/21/22 0909 10/21/22 0924  BP: 133/68 122/70  111/66  Pulse:  74  75  Resp:  18    Temp:  98.7 F (37.1 C)    TempSrc:  Oral    SpO2:  98% 98%   Weight:  77.7 kg    Height:        Intake/Output Summary (Last 24 hours) at 10/21/2022 1227 Last data filed at 10/21/2022 0930 Gross per 24 hour  Intake 183 ml  Output 101.5 ml  Net 81.5 ml       10/21/2022    5:28 AM 10/20/2022    6:06 PM 10/20/2022    2:27 PM  Last 3 Weights  Weight (lbs) 171 lb 4.8 oz 170 lb 10.2 oz 173 lb 4.5 oz  Weight (kg) 77.701 kg 77.4 kg 78.6 kg      Telemetry     NSR  Personally Reviewed  ECG     - Personally Reviewed  Physical Exam   GEN:  middle age male, appears older thanstated age.  .   Neck: No JVD Cardiac: RRR, . + AS murmur   Respiratory: decreased breath sounds bilat c/w COPD  GI: Soft, nontender, non-distended  MS: No edema; dialysis fistula in right upper arm  Neuro:  Nonfocal  Psych: Normal affect   Labs    High Sensitivity Troponin:   Recent Labs  Lab 10/17/22 1912 10/17/22 2104 10/17/22 2344 10/18/22 0318  TROPONINIHS 226* 1,286* 3,927*  6,053*      Chemistry Recent Labs  Lab 10/17/22 1912 10/18/22 0318 10/18/22 1646 10/19/22 0152 10/20/22 0105 10/21/22 0114  NA 133* 130*   < > 134* 131* 135  K 5.1 4.7   < > 4.1 4.4 4.1  CL 91* 92*  --  94* 94* 94*  CO2 25 24  --  28 27 29   GLUCOSE 230* 173*  --  98 95 81  BUN 62* 68*  --  36* 53* 31*  CREATININE 8.75* 9.06*  --  5.37* 7.32* 4.62*  CALCIUM 8.3* 8.2*  --  8.4* 8.4* 8.2*  MG  --  2.0  --   --   --   --   PROT 7.9 7.0  --   --   --   --   ALBUMIN 3.4* 3.0*  --  2.9* 2.8* 2.6*  AST 40 47*  --   --   --   --   ALT 19 16  --   --   --   --   ALKPHOS 59 51  --   --   --   --   BILITOT 0.6 0.4  --   --   --   --   GFRNONAA 6* 6*  --  11* 7* 13*  ANIONGAP 17* 14  --  12 10 12    < > = values in this interval not displayed.     Lipids  Recent Labs  Lab  10/19/22 0152  CHOL 132  TRIG 88  HDL 37*  LDLCALC 77  CHOLHDL 3.6     Hematology Recent Labs  Lab 10/19/22 0940 10/20/22 0105 10/21/22 0114  WBC 8.5 8.1 6.2  RBC 2.79* 2.74* 2.67*  HGB 9.2* 9.2* 8.9*  HCT 27.9* 27.0* 26.0*  MCV 100.0 98.5 97.4  MCH 33.0 33.6 33.3  MCHC 33.0 34.1 34.2  RDW 14.9 15.0 14.6  PLT 209 183 147*    Thyroid  Recent Labs  Lab 10/20/22 0105  TSH 5.173*  FREET4 0.92     BNP Recent Labs  Lab 10/17/22 1912  BNP 1,821.0*     DDimer  Recent Labs  Lab 10/18/22 0318  DDIMER 1.12*      Radiology    CT ANGIO ABDOMEN PELVIS  W &/OR WO CONTRAST  Result Date: 10/21/2022 CLINICAL DATA:  70 year old male history of aortic stenosis. Pre procedural study prior to potential transcatheter aortic valve replacement (TAVR) procedure. EXAM: CT ANGIOGRAPHY CHEST, ABDOMEN AND PELVIS TECHNIQUE: Multidetector CT imaging through the chest, abdomen and pelvis was performed using the standard protocol during bolus administration of intravenous contrast. Multiplanar reconstructed images and MIPs were obtained and reviewed to evaluate the vascular anatomy. RADIATION DOSE REDUCTION: This exam was performed according to the departmental dose-optimization program which includes automated exposure control, adjustment of the mA and/or kV according to patient size and/or use of iterative reconstruction technique. CONTRAST:  37mL OMNIPAQUE IOHEXOL 350 MG/ML SOLN COMPARISON:  No prior chest CT. CT of the abdomen and pelvis 10/17/2018. FINDINGS: CTA CHEST FINDINGS Cardiovascular: Heart size is normal. There is no significant pericardial fluid, thickening or pericardial calcification. There is aortic atherosclerosis, as well as atherosclerosis of the great vessels of the mediastinum and the coronary arteries, including calcified atherosclerotic plaque in the left main, left anterior descending, left circumflex and right coronary arteries. Severe calcifications of the aortic  valve. Severe calcifications of the mitral annulus. Mediastinum/Nodes: No pathologically enlarged mediastinal or hilar lymph nodes. Esophagus is unremarkable in  appearance. No axillary lymphadenopathy. Lungs/Pleura: There are some scattered areas of nodular and mass-like architectural distortion in the lungs bilaterally (right greater than left), favored to be of infectious or inflammatory etiology, but nonspecific (neoplasm is not excluded). The largest of these is in the medial segment of the right middle lobe (axial image 54 of series 12) measuring 3.2 x 1.5 cm, with an additional prominent lesion in the posterior aspect of the right upper lobe (axial image 23 of series 12) measuring 2.5 x 1.4 cm. Trace bilateral pleural effusions lying dependently. Linear scarring in the left lower lobe at the base. Musculoskeletal: There are no aggressive appearing lytic or blastic lesions noted in the visualized portions of the skeleton. CTA ABDOMEN AND PELVIS FINDINGS Hepatobiliary: Liver has a severely shrunken appearance and nodular contour, indicative of advanced cirrhosis. No definite suspicious cystic or solid hepatic lesions. No intra or extrahepatic biliary ductal dilatation. Gallbladder is normal in appearance. Pancreas: No definite pancreatic mass. No pancreatic ductal dilatation. No pancreatic or peripancreatic fluid collections or inflammatory changes. Spleen: Unremarkable. Adrenals/Urinary Tract: Severe atrophy of the right kidney. Severe multifocal cortical thinning and parenchymal volume loss in the right kidney, compatible with areas of chronic post infectious or inflammatory scarring. Moderate atrophy of the left kidney. Low-attenuation lesions in the left kidney measuring up to 2.6 cm in diameter, compatible with simple cysts. Subcentimeter low-attenuation lesion in the lower pole of the left kidney, too small to definitively characterize, but statistically likely tiny cysts (no imaging follow-up  recommended). Bilateral adrenal glands are normal in appearance. No hydroureteronephrosis. Urinary bladder wall appears diffusely thickened. No discrete bladder wall mass. Stomach/Bowel: The appearance of the stomach is unremarkable. There is no pathologic dilatation of small bowel or colon. Normal appendix. Vascular/Lymphatic: Extensive aortic atherosclerosis, with vascular findings and measurements pertinent to potential TAVR procedure, as detailed below. No lymphadenopathy noted in the abdomen or pelvis. Reproductive: Prostate gland and seminal vesicles are unremarkable in appearance. Other: Right inguinal hernia containing only fat. No significant volume of ascites. No pneumoperitoneum. Musculoskeletal: There are no aggressive appearing lytic or blastic lesions noted in the visualized portions of the skeleton. VASCULAR MEASUREMENTS PERTINENT TO TAVR: AORTA: Minimal Aortic Diameter-12 x 10 mm Severity of Aortic Calcification-severe RIGHT PELVIS: Right Common Iliac Artery - Minimal Diameter-6.3 x 6.5 mm Tortuosity-mild Calcification-severe Right External Iliac Artery - Minimal Diameter-4.1 x 4.1 mm Tortuosity-mild Calcification-severe Right Common Femoral Artery - Minimal Diameter-7.0 x 5.5 mm Tortuosity-mild Calcification-severe LEFT PELVIS: Left Common Iliac Artery - Minimal Diameter-5.8 x 6.4 mm Tortuosity-mild Calcification-severe Left External Iliac Artery - Minimal Diameter-4.1 x 2.7 mm Tortuosity-moderate Calcification-severe Left Common Femoral Artery - Minimal Diameter-3.0 x 1.9 mm Tortuosity-mild Calcification-severe Review of the MIP images confirms the above findings. IMPRESSION: 1. Vascular findings and measurements pertinent to potential TAVR procedure, as detailed above. 2. Severe calcification of the aortic valve, compatible with reported clinical history of severe aortic stenosis. 3. There is also very severe calcifications of the mitral annulus. 4. Aortic atherosclerosis, in addition to left main  and three-vessel coronary artery disease. 5. Nodular and mass-like areas of architectural distortion in the lungs, as above, favored to be of infectious or inflammatory etiology. However, the possibility of neoplasm is not excluded. Short-term follow-up noncontrast chest CT is recommended in 1-3 months to ensure resolution of these findings. 6. Trace bilateral pleural effusions lying dependently. 7. Morphologic changes in the liver indicative of advanced cirrhosis. 8. Atrophy of the kidneys bilaterally (right greater than left) with extensive scarring in the  right kidney. 9. Additional incidental findings, as above. Electronically Signed   By: Vinnie Langton M.D.   On: 10/21/2022 09:22   CT ANGIO CHEST AORTA W/CM & OR WO/CM  Result Date: 10/21/2022 CLINICAL DATA:  71 year old male history of aortic stenosis. Pre procedural study prior to potential transcatheter aortic valve replacement (TAVR) procedure. EXAM: CT ANGIOGRAPHY CHEST, ABDOMEN AND PELVIS TECHNIQUE: Multidetector CT imaging through the chest, abdomen and pelvis was performed using the standard protocol during bolus administration of intravenous contrast. Multiplanar reconstructed images and MIPs were obtained and reviewed to evaluate the vascular anatomy. RADIATION DOSE REDUCTION: This exam was performed according to the departmental dose-optimization program which includes automated exposure control, adjustment of the mA and/or kV according to patient size and/or use of iterative reconstruction technique. CONTRAST:  13mL OMNIPAQUE IOHEXOL 350 MG/ML SOLN COMPARISON:  No prior chest CT. CT of the abdomen and pelvis 10/17/2018. FINDINGS: CTA CHEST FINDINGS Cardiovascular: Heart size is normal. There is no significant pericardial fluid, thickening or pericardial calcification. There is aortic atherosclerosis, as well as atherosclerosis of the great vessels of the mediastinum and the coronary arteries, including calcified atherosclerotic plaque in the  left main, left anterior descending, left circumflex and right coronary arteries. Severe calcifications of the aortic valve. Severe calcifications of the mitral annulus. Mediastinum/Nodes: No pathologically enlarged mediastinal or hilar lymph nodes. Esophagus is unremarkable in appearance. No axillary lymphadenopathy. Lungs/Pleura: There are some scattered areas of nodular and mass-like architectural distortion in the lungs bilaterally (right greater than left), favored to be of infectious or inflammatory etiology, but nonspecific (neoplasm is not excluded). The largest of these is in the medial segment of the right middle lobe (axial image 54 of series 12) measuring 3.2 x 1.5 cm, with an additional prominent lesion in the posterior aspect of the right upper lobe (axial image 23 of series 12) measuring 2.5 x 1.4 cm. Trace bilateral pleural effusions lying dependently. Linear scarring in the left lower lobe at the base. Musculoskeletal: There are no aggressive appearing lytic or blastic lesions noted in the visualized portions of the skeleton. CTA ABDOMEN AND PELVIS FINDINGS Hepatobiliary: Liver has a severely shrunken appearance and nodular contour, indicative of advanced cirrhosis. No definite suspicious cystic or solid hepatic lesions. No intra or extrahepatic biliary ductal dilatation. Gallbladder is normal in appearance. Pancreas: No definite pancreatic mass. No pancreatic ductal dilatation. No pancreatic or peripancreatic fluid collections or inflammatory changes. Spleen: Unremarkable. Adrenals/Urinary Tract: Severe atrophy of the right kidney. Severe multifocal cortical thinning and parenchymal volume loss in the right kidney, compatible with areas of chronic post infectious or inflammatory scarring. Moderate atrophy of the left kidney. Low-attenuation lesions in the left kidney measuring up to 2.6 cm in diameter, compatible with simple cysts. Subcentimeter low-attenuation lesion in the lower pole of the left  kidney, too small to definitively characterize, but statistically likely tiny cysts (no imaging follow-up recommended). Bilateral adrenal glands are normal in appearance. No hydroureteronephrosis. Urinary bladder wall appears diffusely thickened. No discrete bladder wall mass. Stomach/Bowel: The appearance of the stomach is unremarkable. There is no pathologic dilatation of small bowel or colon. Normal appendix. Vascular/Lymphatic: Extensive aortic atherosclerosis, with vascular findings and measurements pertinent to potential TAVR procedure, as detailed below. No lymphadenopathy noted in the abdomen or pelvis. Reproductive: Prostate gland and seminal vesicles are unremarkable in appearance. Other: Right inguinal hernia containing only fat. No significant volume of ascites. No pneumoperitoneum. Musculoskeletal: There are no aggressive appearing lytic or blastic lesions noted in the visualized portions  of the skeleton. VASCULAR MEASUREMENTS PERTINENT TO TAVR: AORTA: Minimal Aortic Diameter-12 x 10 mm Severity of Aortic Calcification-severe RIGHT PELVIS: Right Common Iliac Artery - Minimal Diameter-6.3 x 6.5 mm Tortuosity-mild Calcification-severe Right External Iliac Artery - Minimal Diameter-4.1 x 4.1 mm Tortuosity-mild Calcification-severe Right Common Femoral Artery - Minimal Diameter-7.0 x 5.5 mm Tortuosity-mild Calcification-severe LEFT PELVIS: Left Common Iliac Artery - Minimal Diameter-5.8 x 6.4 mm Tortuosity-mild Calcification-severe Left External Iliac Artery - Minimal Diameter-4.1 x 2.7 mm Tortuosity-moderate Calcification-severe Left Common Femoral Artery - Minimal Diameter-3.0 x 1.9 mm Tortuosity-mild Calcification-severe Review of the MIP images confirms the above findings. IMPRESSION: 1. Vascular findings and measurements pertinent to potential TAVR procedure, as detailed above. 2. Severe calcification of the aortic valve, compatible with reported clinical history of severe aortic stenosis. 3. There is  also very severe calcifications of the mitral annulus. 4. Aortic atherosclerosis, in addition to left main and three-vessel coronary artery disease. 5. Nodular and mass-like areas of architectural distortion in the lungs, as above, favored to be of infectious or inflammatory etiology. However, the possibility of neoplasm is not excluded. Short-term follow-up noncontrast chest CT is recommended in 1-3 months to ensure resolution of these findings. 6. Trace bilateral pleural effusions lying dependently. 7. Morphologic changes in the liver indicative of advanced cirrhosis. 8. Atrophy of the kidneys bilaterally (right greater than left) with extensive scarring in the right kidney. 9. Additional incidental findings, as above. Electronically Signed   By: Vinnie Langton M.D.   On: 10/21/2022 09:22   CT CORONARY MORPH W/CTA COR W/SCORE W/CA W/CM &/OR WO/CM  Result Date: 10/20/2022 EXAM: OVER-READ INTERPRETATION  CT CHEST The following report is an over-read performed by radiologist Dr. Ruthann Cancer of [facility], [place] on 10/20/2022. This over-read does not include interpretation of cardiac or coronary anatomy or pathology. The coronary calcium score interpretation by the cardiologist is attached. COMPARISON:  None Available. FINDINGS: Cardiovascular: The heart is normal in size. Severe mitral annular and aortic valvular calcifications are noted. Ascending thoracic aorta is normal in size. Atherosclerotic calcification of the visualized thoracic aorta. Mediastinum/Nodes: Visualized esophagus and trachea within normal limits. No mediastinal lymphadenopathy. Lungs/Pleura: Subpleural tree-in-bud opacities in the lateral right lower lobe and perifissural right middle lobe. Partially visualized bibasilar subsegmental atelectasis with possible trace bilateral pleural effusions. Upper Abdomen: The visualized upper abdomen is within normal limits. Musculoskeletal: No acute osseous abnormality. Bilateral symmetric  gynecomastia. IMPRESSION: 1. Scattered right middle and right lower lower lobe tree-in-bud opacities as could be seen with infectious/inflammatory etiology. 2. Partially visualized bibasilar subsegmental atelectasis with possible trace bilateral pleural effusions. 3. Severe mitral annular and aortic valvular calcifications. 4.  Aortic Atherosclerosis (ICD10-I70.0). Ruthann Cancer, MD [facility] Electronically Signed   By: Ruthann Cancer M.D.   On: 10/20/2022 06:50    Cardiac Studies     Patient Profile     71 y.o. male  with COPD, ESRD, AS,   Assessment & Plan    1.  Moderate to severe aortic stenosis: In the process of getting a TAVR work-up.  He also has severe lung disease and severe coronary artery disease.  TAVR team to continue evaluation tomorrow   2.  COPD: He had 2 respiratory rest over the past month. I strongly advised him to quit smoking.  3.  Severe coronary artery disease: He has tight left main stenosis of approximately 70%.  He has significant CAD involving the LAD, RCA, circumflex.  He has been seen by T CTS  and is poor candidate coronary artery bypass grafting.  He has been in IV heparin until yesterday when it was stopped due to a L groin bleed ( venous ooze)    Will not restart heparin since there are no plans to revascularize his coronaries      4.  ESRD :  on HD       For questions or updates, please contact [place] Please consult [email or web address] for contact info under        Signed, Mertie Moores, MD  10/21/2022, 12:27 PM

## 2022-10-21 NOTE — Progress Notes (Addendum)
[facility] PROGRESS NOTE  Kerry Underwood (DOB: 05-06-1951) ELF:[id number] PCP: [name], FNP   Brief Narrative: Kerry Underwood is a 71 y.o. male with a history of ESRD, COPD, CVA, HTN, and recent admission for acute on chronic HFpEF who presented to the ED with at [facility] on 10/17/2022 with worsening exertional dyspnea, also episode of presyncope found to have wheezing, pulmonary edema and NSTEMI with troponins rising precipitously. He was transferred to [facility] for cardiac catheterization which showed severe multivessel CAD for which CT surgery was consulted. Also found to have severe aortic stenosis, felt to be a significant driver of symptoms. His COPD and ESRD appear to present prohibitive risk for CABG, though work up for AVR is underway. He continues to have respiratory distress, undergoing additional UF with HD for continued pulmonary edema, getting nebulized therapies for possibility of superimposed COPD exacerbation.  Subjective: Continues to feel more short of breath with exertion than is his baseline, though he's much improved from admission. Wheezing, but states this is his baseline. No chest pain.   Objective: BP 111/66   Pulse 75   Temp 98.7 F (37.1 C) (Oral)   Resp 18   Ht 5\' 5"  (1.651 m)   Wt 77.7 kg   SpO2 98%   BMI 28.51 kg/m   Gen: Chronically ill-appearing pleasant male in no distress Pulm: Continued pan-expiratory wheezing, no crackles   CV: RRR, no pitting edema GI: Soft, NT, ND, +BS  Neuro: Alert and oriented. No new focal deficits. Ext: Warm, no deformities Skin: Left femoral inguinal wound with ecchymosis that is nontender, no bruit or thrill, dressing is dry. No other rashes, lesions or ulcers on visualized skin   Assessment & Plan: Acute hypoxic respiratory failure: Due to acute on chronic HFpEF/pulmonary edema. Baseline COPD contributing, though not clearly in severe exacerbation. Given respiratory distress out of proportion to crackles on exam, suspect  there is cardiac component.  - Continue supplemental oxygen to maintain SpO2 >89%. Some chronic CO2 retention noted by blood gas with normal pH. Remains newly hypoxemic with wheezing. While he doesn't feel too far off baseline and is improved overall, steroids may help open him up more. Will start prednisone 40mg  x5 days. - D-dimer modestly elevated. Though there are plenty of explanations for his presentation, CTA (as part of valve evaluation) should sufficiently evaluate for PE. Read still pending. - Continue HD for pulmonary edema.  - Monitor I/O, weights closely - Continue bronchodilators - CT showed tree-in-bud subpleural opacities, had WBC on admission that resolved without abx. Treating empirically with ceftriaxone/azithromycin x5 days - Some atelectasis noted as well, continue OOB as much as possible, incentive spirometry.   Multivessel CAD, NSTEMI, HLD: Said not to have good PCI targets and cardiothoracic surgery evaluation stated he is at prohibitive risk for CABG.  - Continue heparin gtt, ASA - Continue statin, changing to atorvastatin given ESRD. LDL is 77. HbA1c is 5.4%.  - Continue metoprolol 25mg  daily, norvasc 2.5mg  daily. - prn NTG.  Left femoral catheterization site bleeding: Resolved.  - Defer reinitiation of heparin and discontinuation of bed rest orders to cardiology.   Ventricular bigeminy: Noted on personal review of telemetry and seen on monitor during encounter.  - Defer rhythm management to cardiology, continuing cardioselective beta blocker.  - Last TSH was normal, 5.173 here, will check free T3, free T4.   ESRD: Note patient still makes urine. Contrast with CTAs present a nonzero risk, though this is in pursuit of work up for very important  procedure.  - HD per nephrology.    Anemia of ESRD:  - Hgb remains essentially stable despite ecchymosis.  PAD: s/p right femoral endarterectomy and fem-pop bypass Oct 2022, quiescent currently.  - Antiplatelet,  statin  Patrecia Pour, MD [facility] [email or web address] 10/21/2022, 10:51 AM

## 2022-10-22 ENCOUNTER — Inpatient Hospital Stay (HOSPITAL_COMMUNITY): Payer: 59

## 2022-10-22 DIAGNOSIS — I214 Non-ST elevation (NSTEMI) myocardial infarction: Secondary | ICD-10-CM

## 2022-10-22 DIAGNOSIS — Z01810 Encounter for preprocedural cardiovascular examination: Secondary | ICD-10-CM

## 2022-10-22 DIAGNOSIS — I358 Other nonrheumatic aortic valve disorders: Secondary | ICD-10-CM

## 2022-10-22 LAB — CBC
HCT: 28.6 % — ABNORMAL LOW (ref 39.0–52.0)
HCT: 26.4 % — ABNORMAL LOW (ref 39.0–52.0)
Hemoglobin: 9.6 g/dL — ABNORMAL LOW (ref 13.0–17.0)
Hemoglobin: 8.8 g/dL — ABNORMAL LOW (ref 13.0–17.0)
MCH: 33.2 pg (ref 26.0–34.0)
MCH: 32.7 pg (ref 26.0–34.0)
MCHC: 33.6 g/dL (ref 30.0–36.0)
MCHC: 33.3 g/dL (ref 30.0–36.0)
MCV: 99 fL (ref 80.0–100.0)
MCV: 98.1 fL (ref 80.0–100.0)
Platelets: 186 10*3/uL (ref 150–400)
Platelets: 153 10*3/uL (ref 150–400)
RBC: 2.89 MIL/uL — ABNORMAL LOW (ref 4.22–5.81)
RBC: 2.69 MIL/uL — ABNORMAL LOW (ref 4.22–5.81)
RDW: 14.7 % (ref 11.5–15.5)
RDW: 14.6 % (ref 11.5–15.5)
WBC: 7.3 10*3/uL (ref 4.0–10.5)
WBC: 6 10*3/uL (ref 4.0–10.5)
nRBC: 0 % (ref 0.0–0.2)
nRBC: 0 % (ref 0.0–0.2)

## 2022-10-22 LAB — RENAL FUNCTION PANEL
Albumin: 2.7 g/dL — ABNORMAL LOW (ref 3.5–5.0)
Anion gap: 18 — ABNORMAL HIGH (ref 5–15)
BUN: 63 mg/dL — ABNORMAL HIGH (ref 8–23)
CO2: 22 mmol/L (ref 22–32)
Calcium: 8.5 mg/dL — ABNORMAL LOW (ref 8.9–10.3)
Chloride: 90 mmol/L — ABNORMAL LOW (ref 98–111)
Creatinine, Ser: 7.6 mg/dL — ABNORMAL HIGH (ref 0.61–1.24)
GFR, Estimated: 7 mL/min — ABNORMAL LOW (ref >60)
Glucose, Bld: 127 mg/dL — ABNORMAL HIGH (ref 70–99)
Phosphorus: 6.6 mg/dL — ABNORMAL HIGH (ref 2.5–4.6)
Potassium: 4.8 mmol/L (ref 3.5–5.1)
Sodium: 130 mmol/L — ABNORMAL LOW (ref 135–145)

## 2022-10-22 MED ORDER — HEPARIN SODIUM (PORCINE) 5000 UNIT/ML IJ SOLN
5000.0000 [IU] | Freq: Three times a day (TID) | INTRAMUSCULAR | Status: DC
  Administered 2022-10-22 – 2022-10-24 (×6): 5000 [IU] via SUBCUTANEOUS
  Filled 2022-10-22 (×6): qty 1

## 2022-10-22 MED ORDER — IOHEXOL 350 MG/ML SOLN
95.0000 mL | Freq: Once | INTRAVENOUS | Status: AC | PRN
  Administered 2022-10-22: 95 mL via INTRAVENOUS

## 2022-10-22 NOTE — Progress Notes (Addendum)
Rounding Note    Patient Name: Kerry Underwood Date of Encounter: 10/22/2022  [place] Cardiologist: Freada Bergeron, MD   Subjective   Feeling well. No chest pain, sob or palpitations.    Inpatient Medications    Scheduled Meds:  amLODipine  2.5 mg Oral QHS   aspirin EC  81 mg Oral Daily   atorvastatin  80 mg Oral Daily   azithromycin  250 mg Oral Daily   calcitRIOL  0.25 mcg Oral Q T,Th,Sa-HD   darbepoetin (ARANESP) injection - DIALYSIS  40 mcg Subcutaneous Q Sat-1800   fluticasone furoate-vilanterol  1 puff Inhalation Daily   heparin injection (subcutaneous)  5,000 Units Subcutaneous Q8H   metoprolol succinate  25 mg Oral Daily   predniSONE  40 mg Oral Q breakfast   sodium chloride flush  3 mL Intravenous Q12H   sucroferric oxyhydroxide  500 mg Oral TID WC   tamsulosin  0.4 mg Oral QHS   umeclidinium bromide  1 puff Inhalation Daily   Continuous Infusions:  sodium chloride     cefTRIAXone (ROCEPHIN)  IV 1 g (10/21/22 1749)   PRN Meds: sodium chloride, acetaminophen **OR** acetaminophen, albuterol, heparin, lidocaine (PF), lidocaine-prilocaine, morphine injection, ondansetron **OR** ondansetron (ZOFRAN) IV, oxyCODONE, pentafluoroprop-tetrafluoroeth, sodium chloride flush   Vital Signs    Vitals:   10/21/22 2100 10/22/22 0517 10/22/22 0846 10/22/22 0909  BP: 123/67 110/64 115/62   Pulse: 74 71 70   Resp: 18 18    Temp: 98.2 F (36.8 C) 98.1 F (36.7 C)    TempSrc: Oral Oral    SpO2: 98% 99%  98%  Weight:  79.3 kg    Height:        Intake/Output Summary (Last 24 hours) at 10/22/2022 0911 Last data filed at 10/22/2022 0900 Gross per 24 hour  Intake 298 ml  Output --  Net 298 ml      10/22/2022    5:17 AM 10/21/2022    5:28 AM 10/20/2022    6:06 PM  Last 3 Weights  Weight (lbs) 174 lb 12.8 oz 171 lb 4.8 oz 170 lb 10.2 oz  Weight (kg) 79.289 kg 77.701 kg 77.4 kg      Telemetry    SR, PVCs - Personally Reviewed  ECG     N/A  Physical Exam   GEN: No acute distress.   Neck: No JVD Cardiac: RRR, 3/6 systolic murmurs, rubs, or gallops.  Respiratory: Clear to auscultation bilaterally. GI: Soft, nontender, non-distended  MS: No edema; No deformity. Neuro:  Nonfocal  Psych: Normal affect   Labs    High Sensitivity Troponin:   Recent Labs  Lab 10/17/22 1912 10/17/22 2104 10/17/22 2344 10/18/22 0318  TROPONINIHS 226* 1,286* 3,927* 6,053*     Chemistry Recent Labs  Lab 10/17/22 1912 10/18/22 0318 10/18/22 1646 10/20/22 0105 10/21/22 0114 10/22/22 0158  NA 133* 130*   < > 131* 135 130*  K 5.1 4.7   < > 4.4 4.1 4.8  CL 91* 92*   < > 94* 94* 90*  CO2 25 24   < > 27 29 22   GLUCOSE 230* 173*   < > 95 81 127*  BUN 62* 68*   < > 53* 31* 63*  CREATININE 8.75* 9.06*   < > 7.32* 4.62* 7.60*  CALCIUM 8.3* 8.2*   < > 8.4* 8.2* 8.5*  MG  --  2.0  --   --   --   --   PROT 7.9  7.0  --   --   --   --   ALBUMIN 3.4* 3.0*   < > 2.8* 2.6* 2.7*  AST 40 47*  --   --   --   --   ALT 19 16  --   --   --   --   ALKPHOS 59 51  --   --   --   --   BILITOT 0.6 0.4  --   --   --   --   GFRNONAA 6* 6*   < > 7* 13* 7*  ANIONGAP 17* 14   < > 10 12 18*   < > = values in this interval not displayed.    Lipids  Recent Labs  Lab 10/19/22 0152  CHOL 132  TRIG 88  HDL 37*  LDLCALC 77  CHOLHDL 3.6    Hematology Recent Labs  Lab 10/20/22 0105 10/21/22 0114 10/22/22 0158  WBC 8.1 6.2 6.0  RBC 2.74* 2.67* 2.69*  HGB 9.2* 8.9* 8.8*  HCT 27.0* 26.0* 26.4*  MCV 98.5 97.4 98.1  MCH 33.6 33.3 32.7  MCHC 34.1 34.2 33.3  RDW 15.0 14.6 14.6  PLT 183 147* 153   Thyroid  Recent Labs  Lab 10/20/22 0105  TSH 5.173*  FREET4 0.92    BNP Recent Labs  Lab 10/17/22 1912  BNP 1,821.0*    DDimer  Recent Labs  Lab 10/18/22 0318  DDIMER 1.12*     Radiology    No results found.  Cardiac Studies    Cath 10/18/22   Dist RCA lesion is 60% stenosed.   Mid LM to Dist LM lesion is 70% stenosed.   Ost  LAD to Prox LAD lesion is 80% stenosed.   Prox LAD to Mid LAD lesion is 85% stenosed.   Lat 1st Diag lesion is 90% stenosed.   1st Diag lesion is 70% stenosed.   Ramus lesion is 60% stenosed.   Ost Cx to Prox Cx lesion is 99% stenosed.   LV end diastolic pressure is mildly elevated.   Hemodynamic findings consistent with moderate pulmonary hypertension.   There is severe aortic valve stenosis.   Severe 3 vessel obstructive CAD Severe aortic stenosis. Mean gradient 32 mm Hg. AVA 0.9 cm squared with index 0.46 Mildly elevated LV filling pressures. LVEDP 18 mm Hg. PCWP mean 19 mm Hg Moderate pulmonary HTN. PAP 54/21 mean 36 mm Hg Normal cardiac output. By Kathlen Brunswick 7.58 lL/min with index 3.92. by Thermodilution output 3.28 L/min with index 3.28.    Plan; will consult CT surgery. I don't think there are any PCI options with severely calcified vessels, diffuse disease and trifurcation disease in LAD/diagonal distribution. Will review options  with heart team approach.    2D echo 10/18/22  1. Limited Echo to assess change in LVEF. Doppler was not performed.   2. Left ventricular ejection fraction, by estimation, is 70 to 75%. The  left ventricle has hyperdynamic function. The left ventricle has no  regional wall motion abnormalities. There is mild left ventricular  hypertrophy.   3. Severe mitral annular calcification.   4. The inferior vena cava is normal in size with greater than 50%  respiratory variability, suggesting right atrial pressure of 3  mmHg.Limited    2D echo 10/13/22  1. Left ventricular ejection fraction, by estimation, is 70 to 75%. The  left ventricle has hyperdynamic function. The left ventricle has no  regional wall motion abnormalities. There is mild left ventricular  hypertrophy. Left ventricular diastolic  parameters are indeterminate.   2. Right ventricular systolic function is normal. The right ventricular  size is normal. There is moderately elevated pulmonary artery  systolic  pressure. The estimated right ventricular systolic pressure is 27.7 mmHg.   3. Left atrial size was moderately dilated.   4. Right atrial size was mildly dilated.   5. The mitral valve is degenerative. Mild mitral valve regurgitation.  Moderate mitral stenosis. The mean mitral valve gradient is 9.5 mmHg with  average heart rate of 82 bpm. Severe mitral annular calcification. DVI  0.52, in setting of hyperdynamic  ventricle.   6. The aortic valve is abnormal. There is moderate calcification of the  aortic valve. There is moderate thickening of the aortic valve. Aortic  valve regurgitation is not visualized. Moderate to severe aortic valve  stenosis. Aortic valve mean gradient  measures 35.5 mmHg. Aortic valve Vmax measures 3.84 m/s.   7. The inferior vena cava is normal in size with <50% respiratory  variability, suggesting right atrial pressure of 8 mmHg.  Patient Profile     71 y.o. male with PAD (s/p right femoral endarterectomy and fem-pop bypass in 09/2021), HTN, COPD, ESRD, moderate AS/MS by prior echo, NSTEMI 12/2021 (during COPD/flu A admit; patient declined further testing). Admitted to [facility] 11/4-11/5 for  acute hypoxic respiratory failure in the setting of a CHF exacerbation and COPD exacerbation. Volume was managed with HD and repeat echocardiogram showed that his EF was preserved at 70 to 75% with no regional wall motion abnormalities. RV function was normal but PASP was moderately elevated at 57.3 mmHg. He did have mild MR, moderate MS and moderate to severe aortic valve stenosis. He did have a hospital follow-up visit with Dr. Johney Frame on 10/16/2022 but no showed for the visit. Returned to the ED 10/17/22 for worsening dyspnea, found to have NSTEMI with troponin up to 6053. Transferred to [facility] for further evaluation.   Assessment & Plan    NSTEMI/CAD - Cath 10/18/22 with severe 3V CAD, severe AS, mildly elevated LV filling pressures, moderate pulm HTN, normal CO  - Seen by  Dr. Lawson Fiscal >> felt not a candidate for surgical AVR with CABG - Likely medical management of CAD - No chest pain - Continue ASA - Continue Lipitor 80mg  qd  - Continue BB  2. Valvular heart disease with moderate-severe AS, moderate MR by echo  - Cath with Severe aortic stenosis. Mean gradient 32 mm Hg. AVA 0.9 cm squared with index 0.46 - Did not felt candidate for AVR   3. PAD - He is s/p right femoral endarterectomy and fem-pop bypass in 09/2021.   - Continue ASA and statin   4. Groin cath site ozzing - He had oozing for cath site. Stopped after DC IV heparin  - Site stable  Will add [place] heparin for DVT prophylaxis.   For questions or updates, please contact [place] Please consult [email or web address] for contact info under        Signed, Leanor Kail, PA  10/22/2022, 9:11 AM      ATTENDING ATTESTATION:  After conducting a review of all available clinical information with the care team, interviewing the patient, and performing a physical exam, I agree with the findings and plan described in this note.   GEN: No acute distress.   HEENT:  MMM, no JVD, no scleral icterus Cardiac: RRR, 3/6 SEM Respiratory: Coarse without wheezes GI: Soft, nontender, non-distended  MS: No edema; No deformity. Neuro:  Nonfocal  Vasc:  +1 femoral pulses  The patient is doing well today.  His breathing is stable.  He is scheduled for dialysis tomorrow.  We are repeating a TAVR protocol CTA which we hope to review today.  I had a chance to review all of the imaging data collected thus far.  The patient has moderate to severe aortic stenosis with preserved ejection fraction on his echocardiogram.  He has moderate to severe coronary artery disease that I would not categorize as critical.  Perhaps importantly the patient is without angina.  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  He has had a couple of episodes of interrupted dialysis due to low blood pressures.  Apparently last week the patient had 2 episodes of severe shortness of breath with NYHA class IV symptoms.  One happened after dialysis leading to admission.  He has been sustained on dialysis for 3 years without issues.  He does have a history of peripheral arterial disease with right-sided femoral endarterectomy with right femoral-popliteal bypass.  He denies any claudication symptoms.  I reviewed his CTA images that are available which demonstrate a relatively good caliber right common femoral with a filling defect which may be a small dissection in the common iliac artery on the right side.  Left side does not look to be adequate with a very calcified common femoral artery on that side.  His left carotid and left subclavian arteries seem to be good caliber as well.  Given his good functional capacity, compliance with dialysis over the last 3 years, and lack of angina I believe TAVR should be considered.  Given his lack of angina I do not think his coronary artery disease needs to be treated prior to TAVR.  I do not think rapid pacing will be problematic with his burden of coronary artery disease.  He did develop an acute coronary syndrome but we will defer Plavix for now until a plan for therapy is devised particularly if a surgical approach is pursued.  We will review CT imaging when available.  Further recommendations will follow.   Lenna Sciara, MD Pager [phone]  Addendum: Reviewed case with cardiothoracic surgery.  We will obtain PFTs and plan on patient seeing Dr. Lavonna Monarch as outpatient.  I discussed this with the patient and he is in agreement with this plan.

## 2022-10-22 NOTE — Progress Notes (Signed)
[facility] PROGRESS NOTE  Kerry Underwood (DOB: 19-Oct-1951) MGQ:[id number] PCP: [name], FNP   Brief Narrative: Kerry Underwood is a 71 y.o. male with a history of ESRD, COPD, CVA, HTN, and recent admission for acute on chronic HFpEF who presented to the ED with at [facility] on 10/17/2022 with worsening exertional dyspnea, also episode of presyncope found to have wheezing, pulmonary edema and NSTEMI with troponins rising precipitously. He was transferred to [facility] for cardiac catheterization which showed severe multivessel CAD for which CT surgery was consulted. Also found to have severe aortic stenosis, felt to be a significant driver of symptoms. His COPD and ESRD appear to present prohibitive risk for CABG, though work up for AVR is underway. He continues to have respiratory distress, undergoing additional UF with HD for continued pulmonary edema, getting nebulized therapies for possibility of superimposed COPD exacerbation.  Subjective: Breathing is better, back to his baseline. Eager to know cardiology plan this AM. No chest pain  Objective: BP 126/65 (BP Location: Left Arm)   Pulse 77   Temp 98.3 F (36.8 C) (Oral)   Resp 18   Ht 5\' 5"  (1.651 m)   Wt 79.3 kg   SpO2 100%   BMI 29.09 kg/m   Gen: Pleasant male in no distress Pulm: Clear without wheezes, mild crackles at bases CV: RRR, no edema currently.  GI: Soft, NT, ND, +BS  Neuro: Alert and oriented. No new focal deficits. Ext: Warm, no deformities Skin: No new rashes, lesions or ulcers on visualized skin   Assessment & Plan: Acute hypoxic respiratory failure: Due to acute on chronic HFpEF/pulmonary edema. Baseline COPD contributing, though not clearly in severe exacerbation. Given respiratory distress out of proportion to crackles on exam, suspect there is cardiac component.  - Continue supplemental oxygen to maintain SpO2 >89%. Some chronic CO2 retention noted by blood gas with normal pH.  - Continue prednisone 40mg  x5 days  (11/12- 11/16) - D-dimer modestly elevated, no mention of PE on CTAs. - Continue HD for pulmonary edema.  - Monitor I/O, weights closely - Continue bronchodilators - CT showed tree-in-bud subpleural opacities, had WBC on admission that resolved without abx. Treating empirically with ceftriaxone/azithromycin x5 days (11/11 - 11/15). Suggest recheck CT in 1-3 months (this was verbally discussed with the patient) - Some atelectasis noted as well, continue OOB as much as possible, incentive spirometry.   Multivessel CAD, NSTEMI, HLD: Said not to have good PCI targets and cardiothoracic surgery evaluation stated he is at prohibitive risk for CABG.  - Continue ASA, hold plavix until procedural plans finalized. - Continue statin, changed to atorvastatin given ESRD. LDL is 77. HbA1c is 5.4%.  - Continue metoprolol 25mg  daily, norvasc 2.5mg  daily. - prn NTG.  Left femoral catheterization site bleeding: Resolved.  - Heparin stopped per cardiology.  Ventricular bigeminy: Noted on personal review of telemetry and seen on monitor during encounter.  - Defer rhythm management to cardiology, continuing cardioselective beta blocker.  - Last TSH was normal, 5.173 here, free T4 wnl at 0.92.    ESRD: Note patient still makes urine. Contrast with CTAs present a nonzero risk, though this is in pursuit of work up for very important procedure.  - HD per nephrology.    Anemia of ESRD:  - Hgb remains essentially stable despite ecchymosis.  PAD: s/p right femoral endarterectomy and fem-pop bypass Oct 2022, quiescent currently.  - Antiplatelet, statin  Patrecia Pour, MD [facility] [email or web address] 10/22/2022, 3:04 PM

## 2022-10-22 NOTE — Progress Notes (Signed)
[facility] Progress Note   Subjective: Seen in room. No new complaints, thinks his breathing is getting better. Waiting to hear from cardiology.   Objective Vitals:   10/22/22 0517 10/22/22 0846 10/22/22 0909 10/22/22 1100  BP: 110/64 115/62  108/67  Pulse: 71 70  74  Resp: 18   18  Temp: 98.1 F (36.7 C)   98 F (36.7 C)  TempSrc: Oral   Oral  SpO2: 99%  98% 100%  Weight: 79.3 kg     Height:         Additional Objective Labs: Basic Metabolic Panel: Recent Labs  Lab 10/20/22 0105 10/21/22 0114 10/22/22 0158  NA 131* 135 130*  K 4.4 4.1 4.8  CL 94* 94* 90*  CO2 27 29 22   GLUCOSE 95 81 127*  BUN 53* 31* 63*  CREATININE 7.32* 4.62* 7.60*  CALCIUM 8.4* 8.2* 8.5*  PHOS 6.5* 4.9* 6.6*   CBC: Recent Labs  Lab 10/17/22 1912 10/18/22 0318 10/18/22 1646 10/19/22 0940 10/20/22 0105 10/21/22 0114 10/22/22 0158  WBC 13.0* 7.3  --  8.5 8.1 6.2 6.0  NEUTROABS 11.5*  --   --   --   --   --   --   HGB 11.7* 9.6*   < > 9.2* 9.2* 8.9* 8.8*  HCT 34.6* 28.6*   < > 27.9* 27.0* 26.0* 26.4*  MCV 98.6 99.0  --  100.0 98.5 97.4 98.1  PLT 377 186  --  209 183 147* 153   < > = values in this interval not displayed.   Blood Culture    Component Value Date/Time   SDES BLOOD LEFT HAND 09/15/2021 1504   SPECREQUEST  09/15/2021 1504    Blood Culture results may not be optimal due to an excessive volume of blood received in culture bottles BOTTLES DRAWN AEROBIC AND ANAEROBIC   CULT  09/15/2021 1504    NO GROWTH 5 DAYS Performed at [facility], [address], [place],  [address]    REPTSTATUS 09/20/2021 FINAL 09/15/2021 1504     Physical Exam General: Alert, chronically ill appearing nad  Heart: RRR No r,g  Lungs: Decreased at bases Abdomen: soft non-tender Extremities: No LE edema  Dialysis Access: R AVF +bruit   Medications:  sodium chloride     cefTRIAXone (ROCEPHIN)  IV 1 g (10/21/22 1749)    amLODipine  2.5 mg Oral QHS   aspirin EC  81 mg  Oral Daily   atorvastatin  80 mg Oral Daily   azithromycin  250 mg Oral Daily   calcitRIOL  0.25 mcg Oral Q T,Th,Sa-HD   darbepoetin (ARANESP) injection - DIALYSIS  40 mcg Subcutaneous Q Sat-1800   fluticasone furoate-vilanterol  1 puff Inhalation Daily   heparin injection (subcutaneous)  5,000 Units Subcutaneous Q8H   metoprolol succinate  25 mg Oral Daily   predniSONE  40 mg Oral Q breakfast   sodium chloride flush  3 mL Intravenous Q12H   sucroferric oxyhydroxide  500 mg Oral TID WC   tamsulosin  0.4 mg Oral QHS   umeclidinium bromide  1 puff Inhalation Daily    Dialysis Orders:  [facility] TTS , 4 hr 25min,  UFP #4. EDW 79.5 kg 2K 2.5 CA, 180 dialyzer Heparin 5000 units Mircera 30 mcg every 4 weeks but not given recently because of hemoglobins 12, 10.1, 10.2 Calcitriol 0.25 p.o. on HD Access RUA AVF  Assessment/Plan: Acute hypoxic respiratory failure - Secondary to pulmonary edema/Acute/Chronic CHF/severe CAD/COPD.  UF with HD to get volume down. Now below dry weight. Post HD wt 77.7kg on 11/11.  ESRD -HD TTS. Next HD Tues.  NSTEMI/CAD - severe three-vessel obstructive CAD on cardiac cath. Per cardiology. Thought to be high risk for CABG  Severe aortic stenosis- Card following, CT surgery, Dr. Darcey Nora suggested possible TAVR History of COPD - Tobacco abuse currently, Mr. Mcclean thinks he "may stop smoking now" Anemia of CKD-Hb 8.8  restart ESA 10/20/22  40 mcg Aranesp continue on schedule  weekly in hospital Secondary hyperparathyroidism - Continue calcitriol/ Velphoro binder   Lynnda Child PA-C [facility] 10/22/2022,12:35 PM

## 2022-10-22 NOTE — Care Management Important Message (Signed)
Important Message  Patient Details  Name: Saad Buhl MRN: [id number] Date of Birth: 03/14/1951   Medicare Important Message Given:  Yes     Jenavi Beedle 10/22/2022, 4:20 PM

## 2022-10-22 NOTE — Progress Notes (Signed)
Mobility Specialist - Progress Note   10/22/22 1555  Mobility  Activity Ambulated with assistance in hallway  Level of Assistance Standby assist, set-up cues, supervision of patient - no hands on  Assistive Device None  Distance Ambulated (ft) 240 ft  Activity Response Tolerated well  Mobility Referral Yes  $Mobility charge 1 Mobility   Pt received in bed and aggreable to mobility. No complaints throughout. Pt was returned to EOB with all needs met.  Amaya Reives  Mobility Specialist Please contact via SecureChat or Rehab office at [phone]  

## 2022-10-23 ENCOUNTER — Telehealth: Payer: Self-pay | Admitting: Cardiology

## 2022-10-23 ENCOUNTER — Inpatient Hospital Stay (HOSPITAL_COMMUNITY): Payer: 59

## 2022-10-23 LAB — PULMONARY FUNCTION TEST
DL/VA % pred: 112 %
DL/VA: 4.6 ml/min/mmHg/L
DLCO cor % pred: 75 %
DLCO cor: 16.44 ml/min/mmHg
DLCO unc % pred: 59 %
DLCO unc: 12.93 ml/min/mmHg
FEF 25-75 Post: 0.64 L/sec
FEF 25-75 Pre: 0.45 L/sec
FEF2575-%Change-Post: 41 %
FEF2575-%Pred-Post: 34 %
FEF2575-%Pred-Pre: 24 %
FEV1-%Change-Post: 14 %
FEV1-%Pred-Post: 46 %
FEV1-%Pred-Pre: 40 %
FEV1-Post: 1.17 L
FEV1-Pre: 1.02 L
FEV1FVC-%Change-Post: 8 %
FEV1FVC-%Pred-Pre: 75 %
FEV6-%Change-Post: 6 %
FEV6-%Pred-Post: 58 %
FEV6-%Pred-Pre: 54 %
FEV6-Post: 1.92 L
FEV6-Pre: 1.8 L
FEV6FVC-%Change-Post: 1 %
FEV6FVC-%Pred-Post: 105 %
FEV6FVC-%Pred-Pre: 104 %
FVC-%Change-Post: 5 %
FVC-%Pred-Post: 55 %
FVC-%Pred-Pre: 52 %
FVC-Post: 1.94 L
FVC-Pre: 1.84 L
Post FEV1/FVC ratio: 60 %
Post FEV6/FVC ratio: 99 %
Pre FEV1/FVC ratio: 55 %
Pre FEV6/FVC Ratio: 97 %
RV % pred: 164 %
RV: 3.67 L
TLC % pred: 94 %
TLC: 5.67 L

## 2022-10-23 LAB — CBC
HCT: 26.4 % — ABNORMAL LOW (ref 39.0–52.0)
Hemoglobin: 8.8 g/dL — ABNORMAL LOW (ref 13.0–17.0)
MCH: 32.6 pg (ref 26.0–34.0)
MCHC: 33.3 g/dL (ref 30.0–36.0)
MCV: 97.8 fL (ref 80.0–100.0)
Platelets: 175 10*3/uL (ref 150–400)
RBC: 2.7 MIL/uL — ABNORMAL LOW (ref 4.22–5.81)
RDW: 14.8 % (ref 11.5–15.5)
WBC: 7.2 10*3/uL (ref 4.0–10.5)
nRBC: 0 % (ref 0.0–0.2)

## 2022-10-23 LAB — RENAL FUNCTION PANEL
Albumin: 2.8 g/dL — ABNORMAL LOW (ref 3.5–5.0)
Anion gap: 16 — ABNORMAL HIGH (ref 5–15)
BUN: 85 mg/dL — ABNORMAL HIGH (ref 8–23)
CO2: 22 mmol/L (ref 22–32)
Calcium: 8.6 mg/dL — ABNORMAL LOW (ref 8.9–10.3)
Chloride: 92 mmol/L — ABNORMAL LOW (ref 98–111)
Creatinine, Ser: 9.34 mg/dL — ABNORMAL HIGH (ref 0.61–1.24)
GFR, Estimated: 5 mL/min — ABNORMAL LOW (ref >60)
Glucose, Bld: 122 mg/dL — ABNORMAL HIGH (ref 70–99)
Phosphorus: 8.6 mg/dL — ABNORMAL HIGH (ref 2.5–4.6)
Potassium: 5.1 mmol/L (ref 3.5–5.1)
Sodium: 130 mmol/L — ABNORMAL LOW (ref 135–145)

## 2022-10-23 LAB — T3, FREE: T3, Free: 3 pg/mL (ref 2.0–4.4)

## 2022-10-23 MED ORDER — HEPARIN SODIUM (PORCINE) 1000 UNIT/ML DIALYSIS
5000.0000 [IU] | Freq: Once | INTRAMUSCULAR | Status: DC
  Filled 2022-10-23: qty 5

## 2022-10-23 MED ORDER — CHLORHEXIDINE GLUCONATE CLOTH 2 % EX PADS: 6.0000 | MEDICATED_PAD | Freq: Every day | CUTANEOUS | Status: DC

## 2022-10-23 MED ORDER — CLOPIDOGREL BISULFATE 75 MG PO TABS
75.0000 mg | ORAL_TABLET | Freq: Every day | ORAL | Status: DC
  Administered 2022-10-23: 75 mg via ORAL
  Filled 2022-10-23: qty 1

## 2022-10-23 NOTE — Progress Notes (Addendum)
Rounding Note    Patient Name: Kerry Underwood Date of Encounter: 10/23/2022  [facility] Cardiologist: Freada Bergeron, MD   Subjective   Breathing improved. Ongoing TVAR work up.   Inpatient Medications    Scheduled Meds:  amLODipine  2.5 mg Oral QHS   aspirin EC  81 mg Oral Daily   atorvastatin  80 mg Oral Daily   azithromycin  250 mg Oral Daily   calcitRIOL  0.25 mcg Oral Q T,Th,Sa-HD   darbepoetin (ARANESP) injection - DIALYSIS  40 mcg Subcutaneous Q Sat-1800   fluticasone furoate-vilanterol  1 puff Inhalation Daily   heparin injection (subcutaneous)  5,000 Units Subcutaneous Q8H   metoprolol succinate  25 mg Oral Daily   predniSONE  40 mg Oral Q breakfast   sodium chloride flush  3 mL Intravenous Q12H   sucroferric oxyhydroxide  500 mg Oral TID WC   tamsulosin  0.4 mg Oral QHS   umeclidinium bromide  1 puff Inhalation Daily   Continuous Infusions:  sodium chloride     cefTRIAXone (ROCEPHIN)  IV 1 g (10/22/22 1857)   PRN Meds: sodium chloride, acetaminophen **OR** acetaminophen, albuterol, heparin, lidocaine (PF), lidocaine-prilocaine, morphine injection, ondansetron **OR** ondansetron (ZOFRAN) IV, oxyCODONE, pentafluoroprop-tetrafluoroeth, sodium chloride flush   Vital Signs    Vitals:   10/22/22 2107 10/23/22 0333 10/23/22 0819 10/23/22 0830  BP: (!) 145/77 127/74 114/72   Pulse: 74 71 74   Resp: _0 Temp: 98.5 F (36.9 C) 97.6 F (36.4 C) 97.6 F (36.4 C)   TempSrc: Oral Oral Oral   SpO2: 98% 99% 98% 100%  Weight:  80.7 kg    Height:        Intake/Output Summary (Last 24 hours) at 10/23/2022 0853 Last data filed at 10/23/2022 0818 Gross per 24 hour  Intake 606 ml  Output --  Net 606 ml      10/23/2022    3:33 AM 10/22/2022    5:17 AM 10/21/2022    5:28 AM  Last 3 Weights  Weight (lbs) 177 lb 14.4 oz 174 lb 12.8 oz 171 lb 4.8 oz  Weight (kg) 80.695 kg 79.289 kg 77.701 kg      Telemetry    Sinus rhythm - Personally  Reviewed  ECG    N//A  Physical Exam   GEN: No acute distress.   Neck: No JVD Cardiac: RRR, 3/6 systolic murmurs, rubs, or gallops.  Respiratory: Clear to auscultation bilaterally. GI: Soft, nontender, non-distended  MS: No edema; No deformity. Neuro:  Nonfocal  Psych: Normal affect   Labs    High Sensitivity Troponin:   Recent Labs  Lab 10/17/22 1912 10/17/22 2104 10/17/22 2344 10/18/22 0318  TROPONINIHS 226* 1,286* 3,927* 6,053*     Chemistry Recent Labs  Lab 10/17/22 1912 10/18/22 0318 10/18/22 1646 10/21/22 0114 10/22/22 0158 10/23/22 0208  NA 133* 130*   < > 135 130* 130*  K 5.1 4.7   < > 4.1 4.8 5.1  CL 91* 92*   < > 94* 90* 92*  CO2 25 24   < > _1 GLUCOSE 230* 173*   < > 81 127* 122*  BUN 62* 68*   < > 31* 63* 85*  CREATININE 8.75* 9.06*   < > 4.62* 7.60* 9.34*  CALCIUM 8.3* 8.2*   < > 8.2* 8.5* 8.6*  MG  --  2.0  --   --   --   --   PROT  7.9 7.0  --   --   --   --   ALBUMIN 3.4* 3.0*   < > 2.6* 2.7* 2.8*  AST 40 47*  --   --   --   --   ALT 19 16  --   --   --   --   ALKPHOS 59 51  --   --   --   --   BILITOT 0.6 0.4  --   --   --   --   GFRNONAA 6* 6*   < > 13* 7* 5*  ANIONGAP 17* 14   < > 12 18* 16*   < > = values in this interval not displayed.    Lipids  Recent Labs  Lab 10/19/22 0152  CHOL 132  TRIG 88  HDL 37*  LDLCALC 77  CHOLHDL 3.6    Hematology Recent Labs  Lab 10/21/22 0114 10/22/22 0158 10/23/22 0208  WBC 6.2 6.0 7.2  RBC 2.67* 2.69* 2.70*  HGB 8.9* 8.8* 8.8*  HCT 26.0* 26.4* 26.4*  MCV 97.4 98.1 97.8  MCH 33.3 32.7 32.6  MCHC 34.2 33.3 33.3  RDW 14.6 14.6 14.8  PLT 147* 153 175   Thyroid  Recent Labs  Lab 10/20/22 0105  TSH 5.173*  FREET4 0.92    BNP Recent Labs  Lab 10/17/22 1912  BNP 1,821.0*    DDimer  Recent Labs  Lab 10/18/22 0318  DDIMER 1.12*     Radiology    CT CORONARY MORPH W/CTA COR W/SCORE W/CA W/CM &/OR WO/CM  Addendum Date: 10/23/2022   ADDENDUM REPORT: 10/23/2022 08:43  ADDENDUM: The appearance of the visualized portions of the thorax is essentially unchanged compared to recent prior CTA 10/19/2022. Please see separate dictation for CTA chest, abdomen and pelvis dated 10/19/2022 for full description of relevant extracardiac findings. Electronically Signed   By: Vinnie Langton M.D.   On: 10/23/2022 08:43   Result Date: 10/23/2022 CLINICAL DATA:  37M with severe aortic stenosis being evaluated for a TAVR procedure. EXAM: Cardiac TAVR CT TECHNIQUE: The patient was scanned on a [employer]. A 120 kV retrospective scan was triggered in the descending thoracic aorta at 111 HU's. Gantry rotation speed was 250 msecs and collimation was .6 mm. No beta blockade or nitro were given. The 3D data set was reconstructed in 5% intervals of the R-R cycle. Systolic and diastolic phases were analyzed on a dedicated work station using MPR, MIP and VRT modes. The patient received 100 cc of contrast. FINDINGS: Aortic Root: Aortic valve: Trileaflet Aortic valve calcium score: 2915 Aortic annulus: Diameter: 24m x 262mPerimeter: 7156mrea: 385 mm^2 Calcifications: No calcifications Coronary height: Min Left - 82m62min Right - 16mm55motubular height: Left cusp - 20mm;61mht cusp - 22mm; 42moronary cusp - 22mm LV55mas measured 3 mm below the annulus): Diameter: 27mm x 129mArea60m9mm^2 Cal12mcations: No calcifications Aortic sinus width: Left cusp - 32mm; Right50mp - 30mm; Noncor67my cusp - 31mm Sinotubu7mjunction width: 29mmx28mm Opti63mFluoroscopic Angle for Delivery: LAO 5 CRA 1 Cardiac: Right atrium: Mild enlargement Right ventricle: Mild dilatation Pulmonary arteries: Normal size Pulmonary veins: Normal configuration Left atrium: Moderate enlargement Left ventricle: Normal size Pericardium: Normal thickness Coronary arteries: Coronary calcium score 4796 (98th percentile) Mitral valve: Severe MAC IMPRESSION: 1. Trileaflet aortic valve with severe calcifications (AV calcium  score 2915) 2. Aortic annulus measures 26mm x 20mm in 60meter23mh perimeter 71mm and area 38540m2. No annular or  LVOT calcifications. Annular measurements are suitable for delivery of 64m Edwards Sapien 3 valve 3. Sufficient coronary to annulus distance. 4. Optimum Fluoroscopic Angle for Delivery:  LAO 5 CRA 1 5. Coronary calcium score 4796 (98th percentile) Electronically Signed: By: COswaldo MilianM.D. On: 10/22/2022 17:41    Cardiac Studies   Cath 10/18/22   Dist RCA lesion is 60% stenosed.   Mid LM to Dist LM lesion is 70% stenosed.   Ost LAD to Prox LAD lesion is 80% stenosed.   Prox LAD to Mid LAD lesion is 85% stenosed.   Lat 1st Diag lesion is 90% stenosed.   1st Diag lesion is 70% stenosed.   Ramus lesion is 60% stenosed.   Ost Cx to Prox Cx lesion is 99% stenosed.   LV end diastolic pressure is mildly elevated.   Hemodynamic findings consistent with moderate pulmonary hypertension.   There is severe aortic valve stenosis.   Severe 3 vessel obstructive CAD Severe aortic stenosis. Mean gradient 32 mm Hg. AVA 0.9 cm squared with index 0.46 Mildly elevated LV filling pressures. LVEDP 18 mm Hg. PCWP mean 19 mm Hg Moderate pulmonary HTN. PAP 54/21 mean 36 mm Hg Normal cardiac output. By FKathlen Brunswick7.58 lL/min with index 3.92. by Thermodilution output 3.28 L/min with index 3.28.    Plan; will consult CT surgery. I don't think there are any PCI options with severely calcified vessels, diffuse disease and trifurcation disease in LAD/diagonal distribution. Will review options  with heart team approach.    2D echo 10/18/22  1. Limited Echo to assess change in LVEF. Doppler was not performed.   2. Left ventricular ejection fraction, by estimation, is 70 to 75%. The  left ventricle has hyperdynamic function. The left ventricle has no  regional wall motion abnormalities. There is mild left ventricular  hypertrophy.   3. Severe mitral annular calcification.   4. The inferior vena cava  is normal in size with greater than 50%  respiratory variability, suggesting right atrial pressure of 3  mmHg.Limited    2D echo 10/13/22  1. Left ventricular ejection fraction, by estimation, is 70 to 75%. The  left ventricle has hyperdynamic function. The left ventricle has no  regional wall motion abnormalities. There is mild left ventricular  hypertrophy. Left ventricular diastolic  parameters are indeterminate.   2. Right ventricular systolic function is normal. The right ventricular  size is normal. There is moderately elevated pulmonary artery systolic  pressure. The estimated right ventricular systolic pressure is 509.7mmHg.   3. Left atrial size was moderately dilated.   4. Right atrial size was mildly dilated.   5. The mitral valve is degenerative. Mild mitral valve regurgitation.  Moderate mitral stenosis. The mean mitral valve gradient is 9.5 mmHg with  average heart rate of 82 bpm. Severe mitral annular calcification. DVI  0.52, in setting of hyperdynamic  ventricle.   6. The aortic valve is abnormal. There is moderate calcification of the  aortic valve. There is moderate thickening of the aortic valve. Aortic  valve regurgitation is not visualized. Moderate to severe aortic valve  stenosis. Aortic valve mean gradient  measures 35.5 mmHg. Aortic valve Vmax measures 3.84 m/s.   7. The inferior vena cava is normal in size with <50% respiratory  variability, suggesting right atrial pressure of 8 mmHg.   Patient Profile     71y.o. male with PAD (s/p right femoral endarterectomy and fem-pop bypass in 09/2021), HTN, COPD, ESRD, moderate AS/MS by prior echo,  NSTEMI 12/2021 (during COPD/flu A admit; patient declined further testing). Admitted to [facility]/4-11[facility]5 for  acute hypoxic respiratory failure in the setting of a CHF exacerbation and COPD exacerbation. Volume was managed with HD and repeat echocardiogram showed that his EF was preserved at 70 to 75% with no regional wall motion  abnormalities. RV function was normal but PASP was moderately elevated at 57.3 mmHg. He did have mild MR, moderate MS and moderate to severe aortic valve stenosis. He did have a hospital follow-up visit with Dr. Johney Frame on 10/16/2022 but no showed for the visit. Returned to the ED 10/17/22 for worsening dyspnea, found to have NSTEMI with troponin up to 6053. Transferred to [facility] for further evaluation.    Assessment & Plan    NSTEMI/CAD - Cath 10/18/22 with severe 3V CAD, severe AS, mildly elevated LV filling pressures, moderate pulm HTN, normal CO  - Seen by Dr. Lawson Fiscal >> felt not a candidate for surgical AVR with CABG - Likely medical management of CAD. He is very active at baseline without anginal symptoms.  - Continue ASA - Continue Lipitor 9m qd  - Continue BB -Has follow-up for second opinion with cardiothoracic surgery next week; discussed with cardiothoracic surgery will start Plavix 75 mg daily.   2. Valvular heart disease with moderate-severe AS, moderate MR by echo  - Cath with Severe aortic stenosis. Mean gradient 32 mm Hg. AVA 0.9 cm squared with index 0.46 - Did not felt candidate for AVR - Undergoing TVAR evaluation    3. PAD - He is s/p right femoral endarterectomy and fem-pop bypass in 09/2021.   - Continue ASA and statin    4. Groin cath site ozzing - He had oozing for cath site. Stopped after DC IV heparin  - Site stable. No reoccurrence   5. PVCs - Continue BB   6. ESRD on HD  For questions or updates, please contact [place]ount [place]Please consult [email or web address] for contact info under        Signed, BLeanor Kail PA  10/23/2022, 8:53 AM     ATTENDING ATTESTATION:  After conducting a review of all available clinical information with the care team, interviewing the patient, and performing a physical exam, I agree with the findings and plan described in this note.   GEN: No acute distress.   HEENT:  MMM, no JVD, no scleral  icterus Cardiac: RRR, 3/6 murmur without rubs, or gallops.  Respiratory: Clear to auscultation bilaterally. GI: Soft, nontender, non-distended  MS: No edema; No deformity. Neuro:  Nonfocal  Vasc:  +2 radial pulses; RUE fistula  The patient continues to do well.  He was seen in the dialysis unit.  He denies any shortness of breath or chest pain.  He has been ambulating without any issues.  After review with cardiothoracic surgery he will be seen for second opinion regarding his candidacy for surgical aortic valve replacement plus CABG.  His PFTs today demonstrated an FEV1 of close to 1 L.  We will start Plavix 740mfor medical treatment of myocardial infarction with plan to hold Plavix to be informed after surgical opinion next week.  We will sign off okay to discharge.  Please call with questions.  ArLenna SciaraMD Pager [phone]

## 2022-10-23 NOTE — Progress Notes (Signed)
PT Cancellation Note  Patient Details Name: Kerry Underwood MRN: [id number] DOB: Jan 20, 1951   Cancelled Treatment:    Reason Eval/Treat Not Completed: Patient declined, no reason specified (pt declined to participate in physical therapy session or 5 meter walk test. States he is awaiting HD and does not feel like doing it).  Wyona Almas, PT, DPT Acute Rehabilitation Services Office [place] 10/23/2022, 3:49 PM

## 2022-10-23 NOTE — Telephone Encounter (Signed)
Spoke to Coleman with Respiratory who stated she talked to provider on secure chat.   No further action needed from [facility]

## 2022-10-23 NOTE — Telephone Encounter (Signed)
[facility] is requesting a call back to discuss what type of Pulmonary Function Test needs to be run. Requesting call back. Patient is currently admitted to [facility].

## 2022-10-23 NOTE — Progress Notes (Addendum)
Received patient in bed to unit.  Alert and oriented.  Informed consent signed and in chart.   Treatment initiated: 1628 Treatment completed: 2015  Patient tolerated well.  Transported back to the room  Alert, without acute distress.  Hand-off given to patient's nurse.   Access used: fistula right arm  Access issues: none  Total UF removed: 2.4liters Medication(s) given: none Post HD weight:    [facility]  10/23/22 1900  Vitals  BP (!) 115/93  MAP (mmHg) 101  ECG Heart Rate 76  Oxygen Therapy  SpO2 98 %  O2 Device Nasal Cannula  O2 Flow Rate (L/min) 2 L/min  During Treatment Monitoring  Blood Flow Rate (mL/min) 400 mL/min  Arterial Pressure (mmHg) -170 mmHg  Venous Pressure (mmHg) 220 mmHg  TMP (mmHg) 23 mmHg  Ultrafiltration Rate (mL/min) 1944 mL/min  Dialysate Flow Rate (mL/min) 300 ml/min  HD Safety Checks Performed Yes  Intra-Hemodialysis Comments Progressing as prescribed

## 2022-10-23 NOTE — TOC Initial Note (Signed)
Transition of Care [facility]) - Initial/Assessment Note    Patient Details  Name: Kerry Underwood MRN: [id number] Date of Birth: Apr 28, 1951  Transition of Care [facility]) CM/SW Contact:    Bethena Roys, RN Phone Number: 10/23/2022, 1:08 PM  Clinical Narrative:  Patient presented for presyncope. PTA patient states he was independent from home. Patient states he has a cane for support. Patient has PCP and drives himself to appointments and gets medications without any issues. PT/OT no recommendations for home. Case Manager will continue to follow for transition of care needs as the patient progresses.                 Expected Discharge Plan: Home/Self Care Barriers to Discharge: Continued Medical Work up   Patient Goals and CMS Choice Patient states their goals for this hospitalization and ongoing recovery are:: to return home.      Expected Discharge Plan and Services Expected Discharge Plan: Home/Self Care In-house Referral: NA Discharge Planning Services: CM Consult Post Acute Care Choice: NA Living arrangements for the past 2 months: Single Family Home                   DME Agency: NA     Prior Living Arrangements/Services Living arrangements for the past 2 months: Single Family Home Lives with:: Self Patient language and need for interpreter reviewed:: Yes Do you feel safe going back to the place where you live?: Yes      Need for Family Participation in Patient Care: No (Comment) Care giver support system in place?: No (comment) Current home services: DME (has cane in the home.) Criminal Activity/Legal Involvement Pertinent to Current Situation/Hospitalization: No - Comment as needed   Permission Sought/Granted Permission sought to share information with : Case Manager   Emotional Assessment Appearance:: Appears stated age Attitude/Demeanor/Rapport: Engaged Affect (typically observed): Appropriate Orientation: : Oriented to Situation, Oriented to  Time,  Oriented to Place, Oriented to Self Alcohol / Substance Use: Not Applicable Psych Involvement: No (comment)  Admission diagnosis:  Acute respiratory failure with hypoxia ([place]) [J96.01] NSTEMI (non-ST elevated myocardial infarction) [facility]) [I21.4] Patient Active Problem List   Diagnosis Date Noted   Coronary artery disease 10/21/2022   Nonrheumatic aortic valve stenosis 10/18/2022   Acute hypoxemic respiratory failure ([place]) 10/13/2022   Demand ischemia 11/26/2021   Non-ST elevation (NSTEMI) myocardial infarction ([place]) 11/24/2021   Peripheral vascular disease ([place]) 09/25/2021   Nausea 09/20/2021   PAOD (peripheral arterial occlusive disease) ([place]) 09/18/2021   Cellulitis of right foot 09/15/2021   Vitamin D deficiency 08/23/2021   Acute respiratory failure with hypoxia ([place]) 06/29/2021   COPD (chronic obstructive pulmonary disease) ([place]) 11/23/2020   Influenza A Infection 11/22/2020   COPD with acute exacerbation ([place]) 09/17/2020   ESRD on hemodialysis ([place]) 05/11/2020   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   Unspecified protein-calorie malnutrition ([place]) 04/25/2020   Chronic diastolic heart failure ([place]) 04/20/2020   Secondary hyperparathyroidism of renal origin ([place]) 04/20/2020   Anemia due to chronic kidney disease 04/15/2020   Benign prostatic hyperplasia with urinary retention    Hypertension secondary to other renal disorders    PCP:  [name], FNP Pharmacy:   [id number] - [place], [place] [address] Phone: [phone] Fax: [phone]  Readmission Risk Interventions    11/28/2021    2:32 PM 09/18/2021   10:19 AM  Readmission Risk Prevention Plan  Transportation Screening Complete Complete  HRI or  Home Care Consult  Complete  Social Work Consult for [place] Planning/Counseling  Complete  Palliative Care Screening  Not Applicable  Medication Review (RN Care Manager) Complete Complete   PCP or Specialist appointment within 3-5 days of discharge Complete   HRI or Ages Complete   SW Recovery Care/Counseling Consult Complete   Palliative Care Screening Not [place] Not Applicable

## 2022-10-23 NOTE — Progress Notes (Signed)
Mobility Specialist Progress Note   10/23/22 1130  Mobility  Activity Ambulated independently to bathroom;Dangled on edge of bed  Level of Assistance Independent after set-up  Assistive Device None  Range of Motion/Exercises Active;All extremities  Activity Response Tolerated well   Patient received dangling EOB, deferred hallway ambulation but needed assistance to restroom, more so for line management. Ambulated independently to and from restroom without complaint or incident. Was left dangling EOB with all needs met, call bell in reach.   [place] Manya Balash, BS EXP Mobility Specialist Please contact via SecureChat or Rehab office at [phone]

## 2022-10-23 NOTE — Progress Notes (Signed)
[facility] Progress Note   Subjective: Seen in room. Breathing ok. No chest pain. Getting work-up for TAVR. For dialysis today.    Objective Vitals:   10/22/22 2107 10/23/22 0333 10/23/22 0819 10/23/22 0830  BP: (!) 145/77 127/74 114/72   Pulse: 74 71 74   Resp: 18 17 18    Temp: 98.5 F (36.9 C) 97.6 F (36.4 C) 97.6 F (36.4 C)   TempSrc: Oral Oral Oral   SpO2: 98% 99% 98% 100%  Weight:  80.7 kg    Height:         Additional Objective Labs: Basic Metabolic Panel: Recent Labs  Lab 10/21/22 0114 10/22/22 0158 10/23/22 0208  NA 135 130* 130*  K 4.1 4.8 5.1  CL 94* 90* 92*  CO2 29 22 22   GLUCOSE 81 127* 122*  BUN 31* 63* 85*  CREATININE 4.62* 7.60* 9.34*  CALCIUM 8.2* 8.5* 8.6*  PHOS 4.9* 6.6* 8.6*    CBC: Recent Labs  Lab 10/17/22 1912 10/18/22 0318 10/19/22 0940 10/20/22 0105 10/21/22 0114 10/22/22 0158 10/23/22 0208  WBC 13.0*   < > 8.5 8.1 6.2 6.0 7.2  NEUTROABS 11.5*  --   --   --   --   --   --   HGB 11.7*   < > 9.2* 9.2* 8.9* 8.8* 8.8*  HCT 34.6*   < > 27.9* 27.0* 26.0* 26.4* 26.4*  MCV 98.6   < > 100.0 98.5 97.4 98.1 97.8  PLT 377   < > 209 183 147* 153 175   < > = values in this interval not displayed.    Blood Culture    Component Value Date/Time   SDES BLOOD LEFT HAND 09/15/2021 1504   SPECREQUEST  09/15/2021 1504    Blood Culture results may not be optimal due to an excessive volume of blood received in culture bottles BOTTLES DRAWN AEROBIC AND ANAEROBIC   CULT  09/15/2021 1504    NO GROWTH 5 DAYS Performed at [facility], [address] [place], [place] [address]    REPTSTATUS 09/20/2021 FINAL 09/15/2021 1504     Physical Exam General: Alert, chronically ill appearing nad  Heart: RRR No r,g  Lungs: Decreased at bases Abdomen: soft non-tender Extremities: No LE edema  Dialysis Access: R AVF +bruit   Medications:  sodium chloride     cefTRIAXone (ROCEPHIN)  IV 1 g (10/22/22 1857)    amLODipine  2.5 mg  Oral QHS   aspirin EC  81 mg Oral Daily   atorvastatin  80 mg Oral Daily   azithromycin  250 mg Oral Daily   calcitRIOL  0.25 mcg Oral Q T,Th,Sa-HD   darbepoetin (ARANESP) injection - DIALYSIS  40 mcg Subcutaneous Q Sat-1800   fluticasone furoate-vilanterol  1 puff Inhalation Daily   heparin injection (subcutaneous)  5,000 Units Subcutaneous Q8H   metoprolol succinate  25 mg Oral Daily   predniSONE  40 mg Oral Q breakfast   sodium chloride flush  3 mL Intravenous Q12H   sucroferric oxyhydroxide  500 mg Oral TID WC   tamsulosin  0.4 mg Oral QHS   umeclidinium bromide  1 puff Inhalation Daily    Dialysis Orders:  [facility] center TTS , 4 hr 56min,  UFP #4. EDW 79.5 kg 2K 2.5 CA, 180 dialyzer Heparin 5000 units Mircera 30 mcg every 4 weeks but not given recently because of hemoglobins 12, 10.1, 10.2 Calcitriol 0.25 p.o. on HD Access RUA AVF  Assessment/Plan: Acute hypoxic respiratory  failure - Secondary to pulmonary edema/Acute/Chronic CHF/severe CAD/COPD. UF with HD to get volume down. Now below dry weight. Post HD wt 77.7kg on 11/11.  ESRD -HD TTS. Next HD Tues.  NSTEMI/CAD - severe three-vessel obstructive CAD on cardiac cath. Per cardiology. Thought to be high risk for CABG  Severe aortic stenosis- Card following, CT surgery, Dr. Darcey Nora suggested possible TAVR History of COPD - Tobacco abuse currently, Mr. Episcopo thinks he "may stop smoking now" Anemia of CKD-Hb 8.8  restart ESA 10/20/22  40 mcg Aranesp continue on schedule  weekly in hospital Secondary hyperparathyroidism - Continue calcitriol/ Velphoro binder   Lynnda Child PA-C [place] 10/23/2022,11:55 AM

## 2022-10-23 NOTE — Progress Notes (Signed)
   10/23/22 1900  Vitals  BP (!) 115/93  MAP (mmHg) 101  ECG Heart Rate 76  Oxygen Therapy  SpO2 98 %  O2 Device Nasal Cannula  O2 Flow Rate (L/min) 2 L/min  During Treatment Monitoring  Blood Flow Rate (mL/min) 400 mL/min  Arterial Pressure (mmHg) -170 mmHg  Venous Pressure (mmHg) 220 mmHg  TMP (mmHg) 23 mmHg  Ultrafiltration Rate (mL/min) 1944 mL/min  Dialysate Flow Rate (mL/min) 300 ml/min  HD Safety Checks Performed Yes  Intra-Hemodialysis Comments Progressing as prescribed     10/23/22 1900  Vitals  BP (!) 115/93  MAP (mmHg) 101  ECG Heart Rate 76  Oxygen Therapy  SpO2 98 %  O2 Device Nasal Cannula  O2 Flow Rate (L/min) 2 L/min  During Treatment Monitoring  Blood Flow Rate (mL/min) 400 mL/min  Arterial Pressure (mmHg) -170 mmHg  Venous Pressure (mmHg) 220 mmHg  TMP (mmHg) 23 mmHg  Ultrafiltration Rate (mL/min) 1944 mL/min  Dialysate Flow Rate (mL/min) 300 ml/min  HD Safety Checks Performed Yes  Intra-Hemodialysis Comments Progressing as prescribed

## 2022-10-23 NOTE — Progress Notes (Addendum)
[facility] PROGRESS NOTE  Kerry Underwood (DOB: Oct 26, 1951) CHY:[id number] PCP: [name], FNP   Brief Narrative: Kerry Underwood is a 71 y.o. male with a history of ESRD, COPD, CVA, HTN, and recent admission for acute on chronic HFpEF who presented to the ED with at [facility] on 10/17/2022 with worsening exertional dyspnea, also episode of presyncope found to have wheezing, pulmonary edema and NSTEMI with troponins rising precipitously. He was transferred to [facility] for cardiac catheterization which showed severe multivessel CAD for which CT surgery was consulted. Also found to have severe aortic stenosis, felt to be a significant driver of symptoms. His COPD and ESRD appear to present prohibitive risk for CABG, though work up for AVR is underway. He continues to have respiratory distress, undergoing additional UF with HD for continued pulmonary edema, getting nebulized therapies for possibility of superimposed COPD exacerbation.  Subjective: Breathing better, had a coughing spell and put back on O2 but ambulated without it yesterday. Wheezing has resolved.  Objective: BP 114/72 (BP Location: Left Arm)   Pulse 74   Temp 97.6 F (36.4 C) (Oral)   Resp 18   Ht 5\' 5"  (1.651 m)   Wt 80.7 kg   SpO2 100%   BMI 29.60 kg/m   Gen: Pleasant male in no distress Pulm: Clear, nonlabored  CV: RRR, no edema or JVD GI: Soft, NT, ND, +BS  Neuro: Alert and oriented. No new focal deficits. Ext: Warm, no deformities Skin: No rashes, lesions or ulcers on visualized skin   Assessment & Plan: Acute hypoxic respiratory failure: Due to acute on chronic HFpEF/pulmonary edema and COPD exacerbation.  D-dimer modestly elevated, no mention of PE on CTAs. - Continue supplemental oxygen to maintain SpO2 >89%. Some chronic CO2 retention noted by blood gas with normal pH.  - Continue prednisone 40mg  x5 days (11/12- 11/16) - Continue bronchodilators - CT showed tree-in-bud subpleural opacities, had WBC on admission.  Treating empirically with ceftriaxone/azithromycin x5 days (11/11 - 11/15).  - Suggest recheck CT in 1-3 months (this was verbally discussed with the patient) - Some atelectasis noted as well, continue OOB as much as possible, incentive spirometry.  - Continue HD for pulmonary edema.  - Monitor I/O, weights closely - Full PFTs planned in preoperative work up today. He is currently optimized acutely.  Multivessel CAD, NSTEMI, HLD: Said not to have good PCI targets and cardiothoracic surgery evaluation stated he is at prohibitive risk for CABG. PFTs may inform ability to perform open heart surgery. - Continue ASA, hold plavix until procedural plans finalized. - Continue statin, changed to atorvastatin given ESRD. LDL is 77. HbA1c is 5.4%.  - Continue metoprolol 25mg  daily, norvasc 2.5mg  daily. - prn NTG.  Severe aortic stenosis:  - Undergoing TAVR work up per cardiology.  Left femoral catheterization site bleeding: Resolved.  - Heparin stopped per cardiology.  Ventricular bigeminy: Noted on personal review of telemetry and seen on monitor during encounter.  - Defer rhythm management to cardiology, continuing cardioselective beta blocker.  - Last TSH was normal, 5.173 here, free T4 wnl at 0.92.    ESRD:   - HD per nephrology.    Anemia of ESRD:  - Hgb remains essentially stable despite ecchymosis which is improving.Marland Kitchen  PAD: s/p right femoral endarterectomy and fem-pop bypass Oct 2022, quiescent currently.  - Antiplatelet, statin  Patrecia Pour, MD [facility] [email or web address] 10/23/2022, 9:45 AM

## 2022-10-24 ENCOUNTER — Ambulatory Visit: Payer: 59 | Admitting: Vascular Surgery

## 2022-10-24 LAB — CBC
HCT: 28.2 % — ABNORMAL LOW (ref 39.0–52.0)
Hemoglobin: 9.6 g/dL — ABNORMAL LOW (ref 13.0–17.0)
MCH: 33.4 pg (ref 26.0–34.0)
MCHC: 34 g/dL (ref 30.0–36.0)
MCV: 98.3 fL (ref 80.0–100.0)
Platelets: 191 10*3/uL (ref 150–400)
RBC: 2.87 MIL/uL — ABNORMAL LOW (ref 4.22–5.81)
RDW: 15.1 % (ref 11.5–15.5)
WBC: 8.1 10*3/uL (ref 4.0–10.5)
nRBC: 0.2 % (ref 0.0–0.2)

## 2022-10-24 MED ORDER — AZITHROMYCIN 250 MG PO TABS: 250.0000 mg | ORAL_TABLET | Freq: Every day | ORAL | 0 refills | Status: AC

## 2022-10-24 MED ORDER — AMOXICILLIN 500 MG PO CAPS: 500.0000 mg | ORAL_CAPSULE | Freq: Every day | ORAL | 0 refills | Status: AC

## 2022-10-24 MED ORDER — PREDNISONE 20 MG PO TABS: 40.0000 mg | ORAL_TABLET | Freq: Every day | ORAL | 0 refills | Status: AC

## 2022-10-24 MED ORDER — ATORVASTATIN CALCIUM 80 MG PO TABS: 80.0000 mg | ORAL_TABLET | Freq: Every day | ORAL | 0 refills | Status: AC

## 2022-10-24 NOTE — Discharge Summary (Signed)
Physician Discharge Summary  Kerry Underwood IRJ:[id number] DOB: 31-Mar-1951 DOA: 10/17/2022  PCP: [name], FNP  Admit date: 10/17/2022 Discharge date: 10/24/2022  Time spent: 40 minutes  Recommendations for Outpatient Follow-up:  Follow outpatient CBC/CMP  follow CT surgery outpatient for second opinion  Needs outpatient CT scan in 1-3 months to follow abnormal CT scan here  Discharge Diagnoses:  Principal Problem:   Acute respiratory failure with hypoxia ([place]) Active Problems:   Hypertension secondary to other renal disorders   ESRD on hemodialysis (HCC)   COPD (chronic obstructive pulmonary disease) ([place])   Chronic diastolic heart failure (HCC)   Non-ST elevation (NSTEMI) myocardial infarction [facility])   Demand ischemia   Nonrheumatic aortic valve stenosis   Coronary artery disease   Discharge Condition: stable  Diet recommendation: heart healthy  Filed Weights   10/22/22 0517 10/23/22 0333 10/24/22 0549  Weight: 79.3 kg 80.7 kg 78.4 kg    History of present illness:  Kerry Underwood is Kerry Underwood 71 y.o. male with Kerry Underwood history of ESRD, COPD, CVA, HTN, and recent admission for acute on chronic HFpEF who presented to the ED with at [facility] on 10/17/2022 with worsening exertional dyspnea, also episode of presyncope found to have wheezing, pulmonary edema and NSTEMI with troponins rising precipitously. He was transferred to [facility] for cardiac catheterization which showed severe multivessel CAD for which CT surgery was consulted. Also found to have severe aortic stenosis, felt to be Kerry Underwood significant driver of symptoms. His COPD and ESRD appear to present prohibitive risk for CABG, though work up for AVR is underway. He continues to have respiratory distress, undergoing additional UF with HD for continued pulmonary edema, getting nebulized therapies for possibility of superimposed COPD exacerbation.  He's improved with dialysis, COPD treatment.  S/p L/RHC and CT surgery c/s as no good PCI options.  CT  surgery thought he was not Kerry Underwood candidate for surgical AVR with CABG.  He's improved, plan at this point is for second opinion outpatient with CT surgery.    Hospital Course:  Assessment and Plan: Acute hypoxic respiratory failure: Due to acute on chronic HFpEF/pulmonary edema and COPD exacerbation.  D-dimer modestly elevated, no mention of PE on CTAs. - Continue supplemental oxygen to maintain SpO2 >89%. Some chronic CO2 retention noted by blood gas with normal pH.  - Continue prednisone 81m x5 days (11/12- 11/16) - Continue bronchodilators - CT showed tree-in-bud subpleural opacities, had WBC on admission. Treating empirically with 5 day abx course. - Needs repeat CT scan in 1-3 months (this was verbally discussed with the patient by my partner) - Some atelectasis noted as well, continue OOB as much as possible, incentive spirometry.  - Continue HD for pulmonary edema.  - Monitor I/O, weights closely   Multivessel CAD, NSTEMI, HLD: Said not to have good PCI targets and cardiothoracic surgery evaluation stated he is at prohibitive risk for CABG.  - Continue ASA, plavix - Continue statin, changed to atorvastatin given ESRD. LDL is 77. HbA1c is 5.4%.  - Continue metoprolol 258mdaily, norvasc 2.69m4maily. - prn NTG. - PFT's show severe obstructive airway disease, insignificant response to bronchodilator, airtrapping without overinflation, minimal diffusion defect - plan is for outpatient CT surgery eval for second opinion for surgical AV replacement + CABG   Severe aortic stenosis:  - Undergoing TAVR work up per cardiology.   Left femoral catheterization site bleeding: Resolved.  - Heparin stopped per cardiology.   Ventricular bigeminy:  - Defer rhythm management to cardiology, continuing cardioselective beta  blocker.  - Last TSH was slightly elevated, follow outpatient    ESRD:   - HD per nephrology.     Anemia of ESRD:  - Hgb stable   PAD: s/p right femoral endarterectomy and  fem-pop bypass Oct 2022, quiescent currently.  - Antiplatelet, statin     Procedures: L/RHC   Dist RCA lesion is 60% stenosed.   Mid LM to Dist LM lesion is 70% stenosed.   Ost LAD to Prox LAD lesion is 80% stenosed.   Prox LAD to Mid LAD lesion is 85% stenosed.   Lat 1st Diag lesion is 90% stenosed.   1st Diag lesion is 70% stenosed.   Ramus lesion is 60% stenosed.   Ost Cx to Prox Cx lesion is 99% stenosed.   LV end diastolic pressure is mildly elevated.   Hemodynamic findings consistent with moderate pulmonary hypertension.   There is severe aortic valve stenosis.   Severe 3 vessel obstructive CAD Severe aortic stenosis. Mean gradient 32 mm Hg. AVA 0.9 cm squared with index 0.46 Mildly elevated LV filling pressures. LVEDP 18 mm Hg. PCWP mean 19 mm Hg Moderate pulmonary HTN. PAP 54/21 mean 36 mm Hg Normal cardiac output. By Kathlen Brunswick 7.58 lL/min with index 3.92. by Thermodilution output 3.28 L/min with index 3.28.    Plan; will consult CT surgery. I don't think there are any PCI options with severely calcified vessels, diffuse disease and trifurcation disease in LAD/diagonal distribution. Will review options  with heart team approach.   echo IMPRESSIONS     1. Limited Echo to assess change in LVEF. Doppler was not performed.   2. Left ventricular ejection fraction, by estimation, is 70 to 75%. The  left ventricle has hyperdynamic function. The left ventricle has no  regional wall motion abnormalities. There is mild left ventricular  hypertrophy.   3. Severe mitral annular calcification.   4. The inferior vena cava is normal in size with greater than 50%  respiratory variability, suggesting right atrial pressure of 3  mmHg.Limited    Echo IMPRESSIONS     1. Left ventricular ejection fraction, by estimation, is 70 to 75%. The  left ventricle has hyperdynamic function. The left ventricle has no  regional wall motion abnormalities. There is mild left ventricular   hypertrophy. Left ventricular diastolic  parameters are indeterminate.   2. Right ventricular systolic function is normal. The right ventricular  size is normal. There is moderately elevated pulmonary artery systolic  pressure. The estimated right ventricular systolic pressure is 20.2 mmHg.   3. Left atrial size was moderately dilated.   4. Right atrial size was mildly dilated.   5. The mitral valve is degenerative. Mild mitral valve regurgitation.  Moderate mitral stenosis. The mean mitral valve gradient is 9.5 mmHg with  average heart rate of 82 bpm. Severe mitral annular calcification. DVI  0.52, in setting of hyperdynamic  ventricle.   6. The aortic valve is abnormal. There is moderate calcification of the  aortic valve. There is moderate thickening of the aortic valve. Aortic  valve regurgitation is not visualized. Moderate to severe aortic valve  stenosis. Aortic valve mean gradient  measures 35.5 mmHg. Aortic valve Vmax measures 3.84 m/s.   7. The inferior vena cava is normal in size with <50% respiratory  variability, suggesting right atrial pressure of 8 mmHg.    Consultations: Cardiology CT surgery renal  Discharge Exam: Vitals:   10/24/22 0852 10/24/22 0939  BP:  113/72  Pulse: 82 90  Resp:  20 20  Temp:  97.8 F (36.6 C)  SpO2: 98% 97%   Eager to discharge Feels about his baseline  General: No acute distress. Cardiovascular: systolic murmur, RRR Lungs: Clear to auscultation Abdomen: Soft, nontender, nondistended  Neurological: Alert and oriented 3. Moves all extremities 4 with equal strength. Cranial nerves II through XII grossly intact. Extremities: No clubbing or cyanosis. No edema.  Discharge Instructions   Discharge Instructions     Call MD for:  difficulty breathing, headache or visual disturbances   Complete by: As directed    Call MD for:  extreme fatigue   Complete by: As directed    Call MD for:  hives   Complete by: As directed    Call  MD for:  persistant dizziness or light-headedness   Complete by: As directed    Call MD for:  persistant nausea and vomiting   Complete by: As directed    Call MD for:  redness, tenderness, or signs of infection (pain, swelling, redness, odor or green/yellow discharge around incision site)   Complete by: As directed    Call MD for:  severe uncontrolled pain   Complete by: As directed    Call MD for:  temperature >100.4   Complete by: As directed    Diet - low sodium heart healthy   Complete by: As directed    Discharge instructions   Complete by: As directed    You were seen for shortness of breath due to volume overload and Kerry Underwood COPD exacerbation.  You've improved on steroids and antibiotics as well as breathing treatments and dialysis.  We'll send you home with another dose of steroids and 2 more days of antibiotics.    You had Kerry Underwood cardiac catheterization.  You were seen by CT surgery in the hospital here.  At this point in time, the plan is for you to have Kerry Underwood second opinion regarding this outpatient.  Please follow up with CT surgery as scheduled.  Continue your aspirin, statin (lipitor 80 mg), plavix and metoprolol.  You have abnormal findings in your CT scan of your chest that need to be followed with repeat imaging within 1 month.  Return for new, recurrent, or worsening symptoms.  Please ask your PCP to request records from this hospitalization so they know what was done and what the next steps will be.   Increase activity slowly   Complete by: As directed    No wound care   Complete by: As directed       Allergies as of 10/24/2022       Reactions   Strawberry (diagnostic) Rash        Medication List     TAKE these medications    albuterol 108 (90 Base) MCG/ACT inhaler Commonly known as: VENTOLIN HFA Inhale 2 puffs into the lungs every 4 (four) hours as needed for wheezing or shortness of breath.   amLODipine 5 MG tablet Commonly known as: NORVASC Take 2.5 mg by  mouth at bedtime.   amoxicillin 500 MG capsule Commonly known as: AMOXIL Take 1 capsule (500 mg total) by mouth daily for 2 days.   aspirin EC 325 MG tablet Take 325 mg by mouth. Once every 2 days   atorvastatin 80 MG tablet Commonly known as: LIPITOR Take 1 tablet (80 mg total) by mouth daily. Start taking on: October 25, 2022   azithromycin 250 MG tablet Commonly known as: ZITHROMAX Take 1 tablet (250 mg total) by mouth daily for 2 days.  Cholecalciferol 125 MCG (5000 UT) Tabs Take by mouth.   clopidogrel 75 MG tablet Commonly known as: PLAVIX Take 75 mg by mouth daily.   fluticasone furoate-vilanterol 200-25 MCG/INH Aepb Commonly known as: BREO ELLIPTA Inhale 1 puff into the lungs daily.   metoprolol succinate 25 MG 24 hr tablet Commonly known as: Toprol XL Take 1 tablet (25 mg total) by mouth daily.   multivitamin tablet Take 1 tablet by mouth daily.   oxyCODONE-acetaminophen 5-325 MG tablet Commonly known as: Percocet Take 1 tablet by mouth every 6 (six) hours as needed for severe pain.   predniSONE 20 MG tablet Commonly known as: DELTASONE Take 2 tablets (40 mg total) by mouth daily for 1 day. What changed: medication strength   tamsulosin 0.4 MG Caps capsule Commonly known as: FLOMAX Take 0.4 mg by mouth at bedtime.   tiotropium 18 MCG inhalation capsule Commonly known as: Spiriva HandiHaler Place 1 capsule (18 mcg total) into inhaler and inhale daily.       Allergies  Allergen Reactions   Strawberry (Diagnostic) Rash    Follow-up Information     Coralie Common, MD Follow up on 10/29/2022.   Specialty: Cardiothoracic Surgery Why: _0 :30 Contact information: [place] [address] [phone]                  The results of significant diagnostics from this hospitalization (including imaging, microbiology, ancillary and laboratory) are listed below for reference.    Significant Diagnostic Studies: CT  CORONARY MORPH W/CTA COR W/SCORE W/CA W/CM &/OR WO/CM  Addendum Date: 10/23/2022   ADDENDUM REPORT: 10/23/2022 08:43 ADDENDUM: The appearance of the visualized portions of the thorax is essentially unchanged compared to recent prior CTA 10/19/2022. Please see separate dictation for CTA chest, abdomen and pelvis dated 10/19/2022 for full description of relevant extracardiac findings. Electronically Signed   By: Vinnie Langton M.D.   On: 10/23/2022 08:43   Result Date: 10/23/2022 CLINICAL DATA:  74M with severe aortic stenosis being evaluated for [place] TAVR procedure. EXAM: Cardiac TAVR CT TECHNIQUE: The patient was scanned on [employer]. Jahnessa Vanduyn 120 kV retrospective scan was triggered in the descending thoracic aorta at 111 HU's. Gantry rotation speed was 250 msecs and collimation was .6 mm. No beta blockade or nitro were given. The 3D data set was reconstructed in 5% intervals of the R-R cycle. Systolic and diastolic phases were analyzed on [employer] dedicated work station using MPR, MIP and VRT modes. The patient received 100 cc of contrast. FINDINGS: Aortic Root: Aortic valve: Trileaflet Aortic valve calcium score: 2915 Aortic annulus: Diameter: 99m x 292mPerimeter: 7181mrea: 385 mm^2 Calcifications: No calcifications Coronary height: Min Left - 27m23min Right - 16mm38motubular height: Left cusp - 20mm;65mht cusp - 22mm; 63moronary cusp - 22mm LV70mas measured 3 mm below the annulus): Diameter: 27mm x 173mArea60m9mm^2 Cal74mcations: No calcifications Aortic sinus width: Left cusp - 32mm; Right78mp - 30mm; Noncor26my cusp - 31mm Sinotubu21mjunction width: 29mmx28mm Opti26mFluoroscopic Angle for Delivery: LAO 5 CRA 1 Cardiac: Right atrium: Mild enlargement Right ventricle: Mild dilatation Pulmonary arteries: Normal size Pulmonary veins: Normal configuration Left atrium: Moderate enlargement Left ventricle: Normal size Pericardium: Normal thickness Coronary arteries: Coronary calcium score 4796  (98th percentile) Mitral valve: Severe MAC IMPRESSION: 1. Trileaflet aortic valve with severe calcifications (AV calcium score 2915) 2. Aortic annulus measures 26mm x 20mm in 71meter71mh perimeter 71mm and area 38567m2. No  annular or LVOT calcifications. Annular measurements are suitable for delivery of 47m Edwards Sapien 3 valve 3. Sufficient coronary to annulus distance. 4. Optimum Fluoroscopic Angle for Delivery:  LAO 5 CRA 1 5. Coronary calcium score 4796 (98th percentile) Electronically Signed: By: COswaldo MilianM.D. On: 10/22/2022 17:41   CT ANGIO ABDOMEN PELVIS  W &/OR WO CONTRAST  Result Date: 10/21/2022 CLINICAL DATA:  71year old male history of aortic stenosis. Pre procedural study prior to potential transcatheter aortic valve replacement (TAVR) procedure. EXAM: CT ANGIOGRAPHY CHEST, ABDOMEN AND PELVIS TECHNIQUE: Multidetector CT imaging through the chest, abdomen and pelvis was performed using the standard protocol during bolus administration of intravenous contrast. Multiplanar reconstructed images and MIPs were obtained and reviewed to evaluate the vascular anatomy. RADIATION DOSE REDUCTION: This exam was performed according to the departmental dose-optimization program which includes automated exposure control, adjustment of the mA and/or kV according to patient size and/or use of iterative reconstruction technique. CONTRAST:  998mOMNIPAQUE IOHEXOL 350 MG/ML SOLN COMPARISON:  No prior chest CT. CT of the abdomen and pelvis 10/17/2018. FINDINGS: CTA CHEST FINDINGS Cardiovascular: Heart size is normal. There is no significant pericardial fluid, thickening or pericardial calcification. There is aortic atherosclerosis, as well as atherosclerosis of the great vessels of the mediastinum and the coronary arteries, including calcified atherosclerotic plaque in the left main, left anterior descending, left circumflex and right coronary arteries. Severe calcifications of the aortic valve.  Severe calcifications of the mitral annulus. Mediastinum/Nodes: No pathologically enlarged mediastinal or hilar lymph nodes. Esophagus is unremarkable in appearance. No axillary lymphadenopathy. Lungs/Pleura: There are some scattered areas of nodular and mass-like architectural distortion in the lungs bilaterally (right greater than left), favored to be of infectious or inflammatory etiology, but nonspecific (neoplasm is not excluded). The largest of these is in the medial segment of the right middle lobe (axial image 54 of series 12) measuring 3.2 x 1.5 cm, with an additional prominent lesion in the posterior aspect of the right upper lobe (axial image 23 of series 12) measuring 2.5 x 1.4 cm. Trace bilateral pleural effusions lying dependently. Linear scarring in the left lower lobe at the base. Musculoskeletal: There are no aggressive appearing lytic or blastic lesions noted in the visualized portions of the skeleton. CTA ABDOMEN AND PELVIS FINDINGS Hepatobiliary: Liver has Hildegard Hlavac severely shrunken appearance and nodular contour, indicative of advanced cirrhosis. No definite suspicious cystic or solid hepatic lesions. No intra or extrahepatic biliary ductal dilatation. Gallbladder is normal in appearance. Pancreas: No definite pancreatic mass. No pancreatic ductal dilatation. No pancreatic or peripancreatic fluid collections or inflammatory changes. Spleen: Unremarkable. Adrenals/Urinary Tract: Severe atrophy of the right kidney. Severe multifocal cortical thinning and parenchymal volume loss in the right kidney, compatible with areas of chronic post infectious or inflammatory scarring. Moderate atrophy of the left kidney. Low-attenuation lesions in the left kidney measuring up to 2.6 cm in diameter, compatible with simple cysts. Subcentimeter low-attenuation lesion in the lower pole of the left kidney, too small to definitively characterize, but statistically likely tiny cysts (no imaging follow-up recommended).  Bilateral adrenal glands are normal in appearance. No hydroureteronephrosis. Urinary bladder wall appears diffusely thickened. No discrete bladder wall mass. Stomach/Bowel: The appearance of the stomach is unremarkable. There is no pathologic dilatation of small bowel or colon. Normal appendix. Vascular/Lymphatic: Extensive aortic atherosclerosis, with vascular findings and measurements pertinent to potential TAVR procedure, as detailed below. No lymphadenopathy noted in the abdomen or pelvis. Reproductive: Prostate gland and seminal vesicles are unremarkable in appearance.  Other: Right inguinal hernia containing only fat. No significant volume of ascites. No pneumoperitoneum. Musculoskeletal: There are no aggressive appearing lytic or blastic lesions noted in the visualized portions of the skeleton. VASCULAR MEASUREMENTS PERTINENT TO TAVR: AORTA: Minimal Aortic Diameter-12 x 10 mm Severity of Aortic Calcification-severe RIGHT PELVIS: Right Common Iliac Artery - Minimal Diameter-6.3 x 6.5 mm Tortuosity-mild Calcification-severe Right External Iliac Artery - Minimal Diameter-4.1 x 4.1 mm Tortuosity-mild Calcification-severe Right Common Femoral Artery - Minimal Diameter-7.0 x 5.5 mm Tortuosity-mild Calcification-severe LEFT PELVIS: Left Common Iliac Artery - Minimal Diameter-5.8 x 6.4 mm Tortuosity-mild Calcification-severe Left External Iliac Artery - Minimal Diameter-4.1 x 2.7 mm Tortuosity-moderate Calcification-severe Left Common Femoral Artery - Minimal Diameter-3.0 x 1.9 mm Tortuosity-mild Calcification-severe Review of the MIP images confirms the above findings. IMPRESSION: 1. Vascular findings and measurements pertinent to potential TAVR procedure, as detailed above. 2. Severe calcification of the aortic valve, compatible with reported clinical history of severe aortic stenosis. 3. There is also very severe calcifications of the mitral annulus. 4. Aortic atherosclerosis, in addition to left main and  three-vessel coronary artery disease. 5. Nodular and mass-like areas of architectural distortion in the lungs, as above, favored to be of infectious or inflammatory etiology. However, the possibility of neoplasm is not excluded. Short-term follow-up noncontrast chest CT is recommended in 1-3 months to ensure resolution of these findings. 6. Trace bilateral pleural effusions lying dependently. 7. Morphologic changes in the liver indicative of advanced cirrhosis. 8. Atrophy of the kidneys bilaterally (right greater than left) with extensive scarring in the right kidney. 9. Additional incidental findings, as above. Electronically Signed   By: Vinnie Langton M.D.   On: 10/21/2022 09:22   CT ANGIO CHEST AORTA W/CM & OR WO/CM  Result Date: 10/21/2022 CLINICAL DATA:  71 year old male history of aortic stenosis. Pre procedural study prior to potential transcatheter aortic valve replacement (TAVR) procedure. EXAM: CT ANGIOGRAPHY CHEST, ABDOMEN AND PELVIS TECHNIQUE: Multidetector CT imaging through the chest, abdomen and pelvis was performed using the standard protocol during bolus administration of intravenous contrast. Multiplanar reconstructed images and MIPs were obtained and reviewed to evaluate the vascular anatomy. RADIATION DOSE REDUCTION: This exam was performed according to the departmental dose-optimization program which includes automated exposure control, adjustment of the mA and/or kV according to patient size and/or use of iterative reconstruction technique. CONTRAST:  75m OMNIPAQUE IOHEXOL 350 MG/ML SOLN COMPARISON:  No prior chest CT. CT of the abdomen and pelvis 10/17/2018. FINDINGS: CTA CHEST FINDINGS Cardiovascular: Heart size is normal. There is no significant pericardial fluid, thickening or pericardial calcification. There is aortic atherosclerosis, as well as atherosclerosis of the great vessels of the mediastinum and the coronary arteries, including calcified atherosclerotic plaque in the left  main, left anterior descending, left circumflex and right coronary arteries. Severe calcifications of the aortic valve. Severe calcifications of the mitral annulus. Mediastinum/Nodes: No pathologically enlarged mediastinal or hilar lymph nodes. Esophagus is unremarkable in appearance. No axillary lymphadenopathy. Lungs/Pleura: There are some scattered areas of nodular and mass-like architectural distortion in the lungs bilaterally (right greater than left), favored to be of infectious or inflammatory etiology, but nonspecific (neoplasm is not excluded). The largest of these is in the medial segment of the right middle lobe (axial image 54 of series 12) measuring 3.2 x 1.5 cm, with an additional prominent lesion in the posterior aspect of the right upper lobe (axial image 23 of series 12) measuring 2.5 x 1.4 cm. Trace bilateral pleural effusions lying dependently. Linear scarring in the left  lower lobe at the base. Musculoskeletal: There are no aggressive appearing lytic or blastic lesions noted in the visualized portions of the skeleton. CTA ABDOMEN AND PELVIS FINDINGS Hepatobiliary: Liver has Addalynn Kumari severely shrunken appearance and nodular contour, indicative of advanced cirrhosis. No definite suspicious cystic or solid hepatic lesions. No intra or extrahepatic biliary ductal dilatation. Gallbladder is normal in appearance. Pancreas: No definite pancreatic mass. No pancreatic ductal dilatation. No pancreatic or peripancreatic fluid collections or inflammatory changes. Spleen: Unremarkable. Adrenals/Urinary Tract: Severe atrophy of the right kidney. Severe multifocal cortical thinning and parenchymal volume loss in the right kidney, compatible with areas of chronic post infectious or inflammatory scarring. Moderate atrophy of the left kidney. Low-attenuation lesions in the left kidney measuring up to 2.6 cm in diameter, compatible with simple cysts. Subcentimeter low-attenuation lesion in the lower pole of the left  kidney, too small to definitively characterize, but statistically likely tiny cysts (no imaging follow-up recommended). Bilateral adrenal glands are normal in appearance. No hydroureteronephrosis. Urinary bladder wall appears diffusely thickened. No discrete bladder wall mass. Stomach/Bowel: The appearance of the stomach is unremarkable. There is no pathologic dilatation of small bowel or colon. Normal appendix. Vascular/Lymphatic: Extensive aortic atherosclerosis, with vascular findings and measurements pertinent to potential TAVR procedure, as detailed below. No lymphadenopathy noted in the abdomen or pelvis. Reproductive: Prostate gland and seminal vesicles are unremarkable in appearance. Other: Right inguinal hernia containing only fat. No significant volume of ascites. No pneumoperitoneum. Musculoskeletal: There are no aggressive appearing lytic or blastic lesions noted in the visualized portions of the skeleton. VASCULAR MEASUREMENTS PERTINENT TO TAVR: AORTA: Minimal Aortic Diameter-12 x 10 mm Severity of Aortic Calcification-severe RIGHT PELVIS: Right Common Iliac Artery - Minimal Diameter-6.3 x 6.5 mm Tortuosity-mild Calcification-severe Right External Iliac Artery - Minimal Diameter-4.1 x 4.1 mm Tortuosity-mild Calcification-severe Right Common Femoral Artery - Minimal Diameter-7.0 x 5.5 mm Tortuosity-mild Calcification-severe LEFT PELVIS: Left Common Iliac Artery - Minimal Diameter-5.8 x 6.4 mm Tortuosity-mild Calcification-severe Left External Iliac Artery - Minimal Diameter-4.1 x 2.7 mm Tortuosity-moderate Calcification-severe Left Common Femoral Artery - Minimal Diameter-3.0 x 1.9 mm Tortuosity-mild Calcification-severe Review of the MIP images confirms the above findings. IMPRESSION: 1. Vascular findings and measurements pertinent to potential TAVR procedure, as detailed above. 2. Severe calcification of the aortic valve, compatible with reported clinical history of severe aortic stenosis. 3. There is  also very severe calcifications of the mitral annulus. 4. Aortic atherosclerosis, in addition to left main and three-vessel coronary artery disease. 5. Nodular and mass-like areas of architectural distortion in the lungs, as above, favored to be of infectious or inflammatory etiology. However, the possibility of neoplasm is not excluded. Short-term follow-up noncontrast chest CT is recommended in 1-3 months to ensure resolution of these findings. 6. Trace bilateral pleural effusions lying dependently. 7. Morphologic changes in the liver indicative of advanced cirrhosis. 8. Atrophy of the kidneys bilaterally (right greater than left) with extensive scarring in the right kidney. 9. Additional incidental findings, as above. Electronically Signed   By: Vinnie Langton M.D.   On: 10/21/2022 09:22   CT CORONARY MORPH W/CTA COR W/SCORE W/CA W/CM &/OR WO/CM  Result Date: 10/20/2022 EXAM: OVER-READ INTERPRETATION  CT CHEST The following report is an over-read performed by radiologist Dr. Ruthann Cancer of [facility], [place] on 10/20/2022. This over-read does not include interpretation of cardiac or coronary anatomy or pathology. The coronary calcium score interpretation by the cardiologist is attached. COMPARISON:  None Available. FINDINGS: Cardiovascular: The heart is normal in size. Severe mitral  annular and aortic valvular calcifications are noted. Ascending thoracic aorta is normal in size. Atherosclerotic calcification of the visualized thoracic aorta. Mediastinum/Nodes: Visualized esophagus and trachea within normal limits. No mediastinal lymphadenopathy. Lungs/Pleura: Subpleural tree-in-bud opacities in the lateral right lower lobe and perifissural right middle lobe. Partially visualized bibasilar subsegmental atelectasis with possible trace bilateral pleural effusions. Upper Abdomen: The visualized upper abdomen is within normal limits. Musculoskeletal: No acute osseous abnormality. Bilateral symmetric  gynecomastia. IMPRESSION: 1. Scattered right middle and right lower lower lobe tree-in-bud opacities as could be seen with infectious/inflammatory etiology. 2. Partially visualized bibasilar subsegmental atelectasis with possible trace bilateral pleural effusions. 3. Severe mitral annular and aortic valvular calcifications. 4.  Aortic Atherosclerosis (ICD10-I70.0). Ruthann Cancer, MD [facility] Electronically Signed   By: Ruthann Cancer M.D.   On: 10/20/2022 06:50   DG CHEST PORT 1 VIEW  Result Date: 10/19/2022 CLINICAL DATA:  Shortness of breath.  Respiratory distress. EXAM: PORTABLE CHEST 1 VIEW COMPARISON:  10/17/2022 FINDINGS: Cardiac enlargement, stable. Aortic atherosclerosis. Pulmonary vascular congestion and mild interstitial edema is unchanged. Blunting of the costophrenic angles are identified consistent with small effusions. No airspace consolidation. IMPRESSION: Unchanged CHF pattern. Electronically Signed   By: Kerby Moors M.D.   On: 10/19/2022 10:07   CARDIAC CATHETERIZATION  Result Date: 10/18/2022   Dist RCA lesion is 60% stenosed.   Mid LM to Dist LM lesion is 70% stenosed.   Ost LAD to Prox LAD lesion is 80% stenosed.   Prox LAD to Mid LAD lesion is 85% stenosed.   Lat 1st Diag lesion is 90% stenosed.   1st Diag lesion is 70% stenosed.   Ramus lesion is 60% stenosed.   Ost Cx to Prox Cx lesion is 99% stenosed.   LV end diastolic pressure is mildly elevated.   Hemodynamic findings consistent with moderate pulmonary hypertension.   There is severe aortic valve stenosis. Severe 3 vessel obstructive CAD Severe aortic stenosis. Mean gradient 32 mm Hg. AVA 0.9 cm squared with index 0.46 Mildly elevated LV filling pressures. LVEDP 18 mm Hg. PCWP mean 19 mm Hg Moderate pulmonary HTN. PAP 54/21 mean 36 mm Hg Normal cardiac output. By Kathlen Brunswick 7.58 lL/min with index 3.92. by Thermodilution output 3.28 L/min with index 3.28. Plan; will consult  CT surgery. I don't think there are any PCI options with severely calcified vessels, diffuse disease and trifurcation disease in LAD/diagonal distribution. Will review options  with heart team approach.   ECHOCARDIOGRAM LIMITED  Result Date: 10/18/2022    ECHOCARDIOGRAM LIMITED REPORT   Patient Name:   DWANE ANDRES Date of Exam: 10/18/2022 Medical Rec #:  [id number]    Height:       65.0 in Accession #:    [id number]   Weight:       188.5 lb Date of Birth:  1951-02-07    BSA:          1.929 m Patient Age:    61 years     BP:           125/63 mmHg Patient Gender: M            HR:           76 bpm. Exam Location:  [facility] Procedure: Limited Echo Indications:    NSTEMI  History:        Patient has prior history of Echocardiogram examinations, most  recent 10/13/2022. CHF, Acute MI, COPD; Risk                 Factors:Hypertension.  Sonographer:    Wenda Low Referring Phys: [id number] [place]  1. Limited Echo to assess change in LVEF. Doppler was not performed.  2. Left ventricular ejection fraction, by estimation, is 70 to 75%. The left ventricle has hyperdynamic function. The left ventricle has no regional wall motion abnormalities. There is mild left ventricular hypertrophy.  3. Severe mitral annular calcification.  4. The inferior vena cava is normal in size with greater than 50% respiratory variability, suggesting right atrial pressure of 3 mmHg.Limited FINDINGS  Left Ventricle: Left ventricular ejection fraction, by estimation, is 70 to 75%. The left ventricle has hyperdynamic function. The left ventricle has no regional wall motion abnormalities. There is mild left ventricular hypertrophy. Mitral Valve: Severe mitral annular calcification. Tricuspid Valve: The tricuspid valve is not well visualized. Aortic Valve: The aortic valve was not well visualized. Pulmonic Valve: The pulmonic valve was not well visualized. Venous: The inferior vena cava is normal in size with  greater than 50% respiratory variability, suggesting right atrial pressure of 3 mmHg. LEFT VENTRICLE PLAX 2D LVIDd:         4.60 cm LVIDs:         2.80 cm LV PW:         1.30 cm LV IVS:        1.30 cm  LEFT ATRIUM         Index LA diam:    4.60 cm 2.38 cm/m Vishnu Priya Mallipeddi Electronically signed by Lorelee Cover Mallipeddi Signature Date/Time: 10/18/2022/11:37:44 AM    Final    DG Chest Portable 1 View  Result Date: 10/17/2022 CLINICAL DATA:  Shortness of breath. EXAM: PORTABLE CHEST 1 VIEW COMPARISON:  Chest radiograph dated 10/13/2022. FINDINGS: Diffuse interstitial prominence and Kerley B-lines consistent with edema. Mild blunting of the costophrenic angles, new or progressed since the prior radiograph and may represent small pleural effusions. No consolidative changes. No pneumothorax. The cardiac silhouette is within normal limits. Atherosclerotic calcification of the aorta. No acute osseous pathology. IMPRESSION: Relatively similar appearance of interstitial edema. Electronically Signed   By: Anner Crete M.D.   On: 10/17/2022 19:55   ECHOCARDIOGRAM COMPLETE  Result Date: 10/13/2022    ECHOCARDIOGRAM REPORT   Patient Name:   JOSECARLOS HARRIOTT Date of Exam: 10/13/2022 Medical Rec #:  [id number]    Height:       65.0 in Accession #:    [id number]   Weight:       184.3 lb Date of Birth:  March 23, 1951    BSA:          1.911 m Patient Age:    59 years     BP:           161/81 mmHg Patient Gender: M            HR:           83 bpm. Exam Location:  [facility] Procedure: 2D Echo, Cardiac Doppler and Color Doppler Indications:    CHF  History:        Patient has prior history of Echocardiogram examinations, most                 recent 11/25/2021. CHF, COPD; Risk Factors:Hypertension.  Sonographer:    Wenda Low Referring Phys: [id number] [place]  1. Left ventricular ejection fraction, by estimation,  is 70 to 75%. The left ventricle has hyperdynamic function. The left ventricle has no  regional wall motion abnormalities. There is mild left ventricular hypertrophy. Left ventricular diastolic parameters are indeterminate.  2. Right ventricular systolic function is normal. The right ventricular size is normal. There is moderately elevated pulmonary artery systolic pressure. The estimated right ventricular systolic pressure is 93.7 mmHg.  3. Left atrial size was moderately dilated.  4. Right atrial size was mildly dilated.  5. The mitral valve is degenerative. Mild mitral valve regurgitation. Moderate mitral stenosis. The mean mitral valve gradient is 9.5 mmHg with average heart rate of 82 bpm. Severe mitral annular calcification. DVI 0.52, in setting of hyperdynamic ventricle.  6. The aortic valve is abnormal. There is moderate calcification of the aortic valve. There is moderate thickening of the aortic valve. Aortic valve regurgitation is not visualized. Moderate to severe aortic valve stenosis. Aortic valve mean gradient measures 35.5 mmHg. Aortic valve Vmax measures 3.84 m/s.  7. The inferior vena cava is normal in size with <50% respiratory variability, suggesting right atrial pressure of 8 mmHg. FINDINGS  Left Ventricle: Left ventricular ejection fraction, by estimation, is 70 to 75%. The left ventricle has hyperdynamic function. The left ventricle has no regional wall motion abnormalities. The left ventricular internal cavity size was normal in size. There is mild left ventricular hypertrophy. Left ventricular diastolic function could not be evaluated due to mitral annular calcification (moderate or greater). Left ventricular diastolic parameters are indeterminate. Right Ventricle: The right ventricular size is normal. No increase in right ventricular wall thickness. Right ventricular systolic function is normal. There is moderately elevated pulmonary artery systolic pressure. The tricuspid regurgitant velocity is 3.51 m/s, and with an assumed right atrial pressure of 8 mmHg, the estimated  right ventricular systolic pressure is 16.9 mmHg. Left Atrium: Left atrial size was moderately dilated. Right Atrium: Right atrial size was mildly dilated. Pericardium: There is no evidence of pericardial effusion. Mitral Valve: The mitral valve is degenerative in appearance. Severe mitral annular calcification. Mild mitral valve regurgitation. Moderate mitral valve stenosis. MV peak gradient, 22.5 mmHg. The mean mitral valve gradient is 9.5 mmHg with average heart  rate of 82 bpm. Tricuspid Valve: The tricuspid valve is normal in structure. Tricuspid valve regurgitation is mild . No evidence of tricuspid stenosis. Aortic Valve: The aortic valve is abnormal. There is moderate calcification of the aortic valve. There is moderate thickening of the aortic valve. Aortic valve regurgitation is not visualized. Moderate to severe aortic stenosis is present. Aortic valve mean gradient measures 35.5 mmHg. Aortic valve peak gradient measures 59.1 mmHg. Aortic valve area, by VTI measures 0.99 cm. Pulmonic Valve: The pulmonic valve was normal in structure. Pulmonic valve regurgitation is trivial. No evidence of pulmonic stenosis. Aorta: The ascending aorta was not well visualized and the aortic root is normal in size and structure. Venous: The inferior vena cava is normal in size with less than 50% respiratory variability, suggesting right atrial pressure of 8 mmHg. IAS/Shunts: No atrial level shunt detected by color flow Doppler.  LEFT VENTRICLE PLAX 2D LVIDd:         4.70 cm   Diastology LVIDs:         3.20 cm   LV e' medial:  8.05 cm/s LV PW:         1.30 cm   LV e' lateral: 10.40 cm/s LV IVS:        1.00 cm LVOT diam:     2.00 cm  LV SV:         95 LV SV Index:   50 LVOT Area:     3.14 cm  RIGHT VENTRICLE RV Basal diam:  3.20 cm RV Mid diam:    2.50 cm RV S prime:     12.50 cm/s TAPSE (M-mode): 2.6 cm LEFT ATRIUM             Index        RIGHT ATRIUM           Index LA diam:        4.40 cm 2.30 cm/m   RA Area:     23.10  cm LA Vol (A2C):   98.0 ml 51.29 ml/m  RA Volume:   68.60 ml  35.90 ml/m LA Vol (A4C):   77.2 ml 40.41 ml/m LA Biplane Vol: 96.3 ml 50.40 ml/m  AORTIC VALVE                     PULMONIC VALVE AV Area (Vmax):    1.05 cm      PV Vmax:       1.31 m/s AV Area (Vmean):   0.91 cm      PV Peak grad:  6.9 mmHg AV Area (VTI):     0.99 cm AV Vmax:           384.50 cm/s AV Vmean:          273.333 cm/s AV VTI:            0.964 m AV Peak Grad:      59.1 mmHg AV Mean Grad:      35.5 mmHg LVOT Vmax:         128.50 cm/s LVOT Vmean:        79.100 cm/s LVOT VTI:          0.303 m LVOT/AV VTI ratio: 0.31  AORTA Ao Root diam: 2.80 cm Ao Asc diam:  3.60 cm MITRAL VALVE                  TRICUSPID VALVE MV Area (PHT): 3.93 cm       TR Peak grad:   49.3 mmHg MV Area VTI:   1.65 cm       TR Vmax:        351.00 cm/s MV Peak grad:  22.5 mmHg MV Mean grad:  9.5 mmHg       SHUNTS MV Vmax:       2.37 m/s       Systemic VTI:  0.30 m MV Vmean:      144.0 cm/s     Systemic Diam: 2.00 cm MR Peak grad:    155.8 mmHg MR Mean grad:    94.0 mmHg MR Vmax:         624.00 cm/s MR Vmean:        442.0 cm/s MR PISA:         2.26 cm MR PISA Eff ROA: 33 mm MR PISA Radius:  0.60 cm Cherlynn Kaiser MD Electronically signed by Cherlynn Kaiser MD Signature Date/Time: 10/13/2022/3:47:34 PM    Final    DG Chest Port 1 View  Result Date: 10/13/2022 CLINICAL DATA:  71 year old male with history of shortness of breath. EXAM: PORTABLE CHEST 1 VIEW COMPARISON:  Chest x-ray 11/26/2021. FINDINGS: There is cephalization of the pulmonary vasculature and slight indistinctness of the interstitial markings suggestive of mild pulmonary edema. No confluent consolidative airspace disease. Trace right pleural  effusion. No definite left pleural effusion. No pneumothorax. Heart size is upper limits of normal. Upper mediastinal contours are within normal limits. Atherosclerotic calcifications are noted in the thoracic aorta. IMPRESSION: 1. The appearance of the chest  suggest pulmonary edema. Given the normal cardiac size, this may reflect noncardiogenic pulmonary edema, fluid overload, or potential diastolic heart failure. Further clinical evaluation is recommended. Electronically Signed   By: Vinnie Langton M.D.   On: 10/13/2022 08:26    Microbiology: Recent Results (from the past 240 hour(s))  MRSA Next Gen by PCR, Nasal     Status: None   Collection Time: 10/19/22  6:26 AM   Specimen: Nasal Mucosa; Nasal Swab  Result Value Ref Range Status   MRSA by PCR Next Gen NOT DETECTED NOT DETECTED Final    Comment: (NOTE) The GeneXpert MRSA Assay (FDA approved for NASAL specimens only), is one component of [facility] comprehensive MRSA colonization surveillance program. It is not intended to diagnose MRSA infection nor to guide or monitor treatment for MRSA infections. Test performance is not FDA approved in patients less than 45 years old. Performed at [facility], [place] [address] [place], [place] [address]      Labs: Basic Metabolic Panel: Recent Labs  Lab 10/18/22 0318 10/18/22 1646 10/19/22 0152 10/20/22 0105 10/21/22 0114 10/22/22 0158 10/23/22 0208  NA 130*   < > 134* 131* 135 130* 130*  K 4.7   < > 4.1 4.4 4.1 4.8 5.1  CL 92*  --  94* 94* 94* 90* 92*  CO2 24  --  _0 GLUCOSE 173*  --  98 95 81 127* 122*  BUN 68*  --  36* 53* 31* 63* 85*  CREATININE 9.06*  --  5.37* 7.32* 4.62* 7.60* 9.34*  CALCIUM 8.2*  --  8.4* 8.4* 8.2* 8.5* 8.6*  MG 2.0  --   --   --   --   --   --   PHOS 7.4*  --  5.8* 6.5* 4.9* 6.6* 8.6*   < > = values in this interval not displayed.   Liver Function Tests: Recent Labs  Lab 10/17/22 1912 10/18/22 0318 10/19/22 0152 10/20/22 0105 10/21/22 0114 10/22/22 0158 10/23/22 0208  AST 40 47*  --   --   --   --   --   ALT 19 16  --   --   --   --   --   ALKPHOS 59 51  --   --   --   --   --   BILITOT 0.6 0.4  --   --   --   --   --   PROT 7.9 7.0  --   --   --   --   --   ALBUMIN 3.4* 3.0* 2.9* 2.8*  2.6* 2.7* 2.8*   No results for input(s): "LIPASE", "AMYLASE" in the last 168 hours. No results for input(s): "AMMONIA" in the last 168 hours. CBC: Recent Labs  Lab 10/17/22 1912 10/18/22 0318 10/20/22 0105 10/21/22 0114 10/22/22 0158 10/23/22 0208 10/24/22 0232  WBC 13.0*   < > 8.1 6.2 6.0 7.2 8.1  NEUTROABS 11.5*  --   --   --   --   --   --   HGB 11.7*   < > 9.2* 8.9* 8.8* 8.8* 9.6*  HCT 34.6*   < > 27.0* 26.0* 26.4* 26.4* 28.2*  MCV 98.6   < > 98.5 97.4 98.1  97.8 98.3  PLT 377   < > 183 147* 153 175 191   < > = values in this interval not displayed.   Cardiac Enzymes: No results for input(s): "CKTOTAL", "CKMB", "CKMBINDEX", "TROPONINI" in the last 168 hours. BNP: BNP (last 3 results) Recent Labs    10/13/22 0805 10/17/22 1912  BNP 2,290.0* 1,821.0*    ProBNP (last 3 results) No results for input(s): "PROBNP" in the last 8760 hours.  CBG: No results for input(s): "GLUCAP" in the last 168 hours.     Signed:  Fayrene Helper MD.  [facility] 10/24/2022, 10:58 AM

## 2022-10-24 NOTE — Care Management Important Message (Signed)
Important Message  Patient Details  Name: Kerry Underwood MRN: [id number] Date of Birth: 20-Dec-1950   Medicare Important Message Given:  Yes     Shelda Altes 10/24/2022, 10:57 AM

## 2022-10-24 NOTE — Progress Notes (Signed)
Mobility Specialist - Progress Note   10/24/22 1116  Mobility  Activity Ambulated with assistance in hallway  Level of Assistance Standby assist, set-up cues, supervision of patient - no hands on  Assistive Device None  Distance Ambulated (ft) 240 ft  Activity Response Tolerated well  Mobility Referral Yes  $Mobility charge 1 Mobility   Pt received in bed and agreeable to mobility. No complaints throughout. Pt was returned to EOB with all needs met  Franki Monte  Mobility Specialist Please contact via [place] or Rehab office at [phone]

## 2022-10-24 NOTE — Progress Notes (Signed)
D/C order noted. Contacted [place] and spoke to [place]. Clinic advised pt will d/c today and resume resume care tomorrow.   Melven Sartorius Renal Navigator [phone]

## 2022-10-24 NOTE — Progress Notes (Signed)
SATURATION QUALIFICATIONS: (This note is used to comply with regulatory documentation for home oxygen)  Patient Saturations on Room Air at Rest = 97%  Patient Saturations on Room Air while Ambulating = 94%   Please briefly explain why patient needs home oxygen: Pt is maintaining sats >/= 94% on RA and thus does not require home O2.   Moishe Spice, PT, DPT Acute Rehabilitation Services  Office: [phone]

## 2022-10-24 NOTE — Care Management Important Message (Signed)
Important Message  Patient Details  Name: Kerry Underwood MRN: [id number] Date of Birth: Sep 12, 1951   Medicare Important Message Given:  Yes     Shelda Altes 10/24/2022, 10:56 AM

## 2022-10-24 NOTE — Progress Notes (Signed)
Physical Therapy Treatment Patient Details Name: Dina Mobley MRN: [id number] DOB: 1951-05-21 Today's Date: 10/24/2022   History of Present Illness Pt is a 71 y.o. male admitted 10/17/22 with DOE, presyncope, wheezing. Workup for acute hypoxic respiratory failure secondary to HFpEF/pulmonary edema, NSTEMI. [place] 11/9 with severe 3-vessel disease, severe AS. Deemed not a candidate for surgical AVR with CABG; may be candidate for TAVR. Bedrest 11/11 for cath site oozing, verbal order cleared by cardiology 11/12. PMH includes CAD, HLD, HF, COPD, PAD, ESRD, viral hepatitis.    PT Comments    Session focused on assessing pt's SpO2 levels and gait speed via conducting 5 MW Test. Pt was able to maintain sats >/= 94% on RA throughout the session. He is able to ambulate smoothly without LOB, but with decreased stride length and speed, see below. Will continue to follow acutely.  5MW Test   Trial 1 - 7.3 sec, 2.25 ft/sec Trial 2 - 6.96 sec, 2.36 ft/sec Trial 3 - 7.73 sec, 2.12 ft/sec  Average 5MW Test = 2.24 ft/sec      Recommendations for follow up therapy are one component of a multi-disciplinary discharge planning process, led by the attending physician.  Recommendations may be updated based on patient status, additional functional criteria and insurance authorization.  Follow Up Recommendations  No PT follow up     Assistance Recommended at Discharge None  Patient can return home with the following     Equipment Recommendations  None recommended by PT    Recommendations for Other Services       Precautions / Restrictions Precautions Precautions: None Restrictions Weight Bearing Restrictions: No     Mobility  Bed Mobility Overal bed mobility: Modified Independent             General bed mobility comments: Pt able to perform bed mobility without assistance, HOB elevated    Transfers Overall transfer level: Needs assistance Equipment used: None Transfers: Sit to/from  Stand Sit to Stand: Supervision           General transfer comment: Extra time due to stiffness for first time getting up this morning, supervision for safety, no LOB    Ambulation/Gait Ambulation/Gait assistance: Supervision Gait Distance (Feet): 260 Feet Assistive device: None Gait Pattern/deviations: Step-through pattern Gait velocity: adequate Gait velocity interpretation: 1.31 - 2.62 ft/sec, indicative of limited community ambulator   General Gait Details: Pt with slightly slowed pace, but no LOB, supervision for safety   Stairs             Wheelchair Mobility    Modified Rankin (Stroke Patients Only)       Balance Overall balance assessment: Mild deficits observed, not formally tested                                          Cognition Arousal/Alertness: Awake/alert Behavior During Therapy: WFL for tasks assessed/performed Overall Cognitive Status: Within Functional Limits for tasks assessed                                          Exercises      General Comments General comments (skin integrity, edema, etc.): SpO2 >/= 94% on RA throughout; 5MW Test = Trial 1 - 7.3 sec, 2.25 ft/sec; Trial 2 - 6.96 sec, 2.36 ft/sec; Trial 3 -  7.73 sec, 2.12 ft/sec; Average 5MW Test = 2.24 ft/sec      Pertinent Vitals/Pain Pain Assessment Pain Assessment: No/denies pain    Home Living                          Prior Function            PT Goals (current goals can now be found in the care plan section) Acute Rehab PT Goals Patient Stated Goal: to go home today PT Goal Formulation: With patient Time For Goal Achievement: 10/28/22 Potential to Achieve Goals: Good Progress towards PT goals: Progressing toward goals    Frequency    Min 2X/week      PT Plan Current plan remains appropriate    Co-evaluation              AM-PAC PT "6 Clicks" Mobility   Outcome Measure  Help needed turning from your  back to your side while in a flat bed without using bedrails?: None Help needed moving from lying on your back to sitting on the side of a flat bed without using bedrails?: None Help needed moving to and from a bed to a chair (including a wheelchair)?: A Little Help needed standing up from a chair using your arms (e.g., wheelchair or bedside chair)?: A Little Help needed to walk in hospital room?: A Little Help needed climbing 3-5 steps with a railing? : A Little 6 Click Score: 20    End of Session   Activity Tolerance: Patient tolerated treatment well Patient left: in bed;with call bell/phone within reach Nurse Communication: Mobility status;Other (comment) (sats) PT Visit Diagnosis: Other abnormalities of gait and mobility (R26.89)     Time: 6962-9528 PT Time Calculation (min) (ACUTE ONLY): 15 min  Charges:  $Gait Training: 8-22 mins                     Moishe Spice, PT, DPT Acute Rehabilitation Services  Office: [place] 10/24/2022, 9:59 AM

## 2022-10-25 ENCOUNTER — Telehealth (HOSPITAL_COMMUNITY): Payer: Self-pay | Admitting: Nephrology

## 2022-10-25 NOTE — Telephone Encounter (Signed)
Transition of care contact from inpatient facility  Date of discharge: 10/24/22 Date of contact: 10/25/22 Method: Phone Spoke to: Patient  Patient contacted to discuss transition of care from recent inpatient hospitalization. Patient was admitted to [facility] from 11/8 - 10/24/2022 with discharge diagnosis of acute respiratory failure, pulm edema, pneumonia, CAD, aortic stenosis.   Medication changes were reviewed. Says friend will be picking them up today.  Was supposed to have outpatient dialysis today. Says he didn't go today - wasn't feeling well and didn't trust himself to drive. We had a long talk that  not coming for dialysis is only going to exacerbate his breathing issues. He understands. Offered HD tomorrow instead - he was agreeable (although will see if actually will go). Called his outpatient HD unit to have them call and offer him HD spot tomorrow.  Veneta Penton, PA-C [employer] Pager [phone]

## 2022-10-26 ENCOUNTER — Emergency Department (HOSPITAL_COMMUNITY)
Admission: EM | Admit: 2022-10-26 | Discharge: 2022-11-09 | Disposition: E | Payer: 59 | Attending: Emergency Medicine | Admitting: Emergency Medicine

## 2022-10-26 ENCOUNTER — Emergency Department (HOSPITAL_COMMUNITY): Payer: 59

## 2022-10-26 DIAGNOSIS — R778 Other specified abnormalities of plasma proteins: Secondary | ICD-10-CM | POA: Insufficient documentation

## 2022-10-26 DIAGNOSIS — M7981 Nontraumatic hematoma of soft tissue: Secondary | ICD-10-CM | POA: Insufficient documentation

## 2022-10-26 DIAGNOSIS — I509 Heart failure, unspecified: Secondary | ICD-10-CM | POA: Insufficient documentation

## 2022-10-26 DIAGNOSIS — D72829 Elevated white blood cell count, unspecified: Secondary | ICD-10-CM | POA: Diagnosis not present

## 2022-10-26 DIAGNOSIS — Z79899 Other long term (current) drug therapy: Secondary | ICD-10-CM | POA: Insufficient documentation

## 2022-10-26 DIAGNOSIS — Z7902 Long term (current) use of antithrombotics/antiplatelets: Secondary | ICD-10-CM | POA: Diagnosis not present

## 2022-10-26 DIAGNOSIS — R7309 Other abnormal glucose: Secondary | ICD-10-CM | POA: Insufficient documentation

## 2022-10-26 DIAGNOSIS — I469 Cardiac arrest, cause unspecified: Secondary | ICD-10-CM | POA: Diagnosis present

## 2022-10-26 DIAGNOSIS — Z7982 Long term (current) use of aspirin: Secondary | ICD-10-CM | POA: Diagnosis not present

## 2022-10-26 DIAGNOSIS — I132 Hypertensive heart and chronic kidney disease with heart failure and with stage 5 chronic kidney disease, or end stage renal disease: Secondary | ICD-10-CM | POA: Insufficient documentation

## 2022-10-26 DIAGNOSIS — J449 Chronic obstructive pulmonary disease, unspecified: Secondary | ICD-10-CM | POA: Insufficient documentation

## 2022-10-26 DIAGNOSIS — N186 End stage renal disease: Secondary | ICD-10-CM | POA: Diagnosis not present

## 2022-10-26 DIAGNOSIS — Z992 Dependence on renal dialysis: Secondary | ICD-10-CM | POA: Diagnosis not present

## 2022-10-26 LAB — CBC
HCT: 32.7 % — ABNORMAL LOW (ref 39.0–52.0)
Hemoglobin: 10.1 g/dL — ABNORMAL LOW (ref 13.0–17.0)
MCH: 33.3 pg (ref 26.0–34.0)
MCHC: 30.9 g/dL (ref 30.0–36.0)
MCV: 107.9 fL — ABNORMAL HIGH (ref 80.0–100.0)
Platelets: 251 10*3/uL (ref 150–400)
RBC: 3.03 MIL/uL — ABNORMAL LOW (ref 4.22–5.81)
RDW: 16.5 % — ABNORMAL HIGH (ref 11.5–15.5)
WBC: 21.6 10*3/uL — ABNORMAL HIGH (ref 4.0–10.5)
nRBC: 1.3 % — ABNORMAL HIGH (ref 0.0–0.2)

## 2022-10-26 LAB — BASIC METABOLIC PANEL
Anion gap: 21 — ABNORMAL HIGH (ref 5–15)
BUN: 90 mg/dL — ABNORMAL HIGH (ref 8–23)
CO2: 14 mmol/L — ABNORMAL LOW (ref 22–32)
Calcium: 9.3 mg/dL (ref 8.9–10.3)
Chloride: 100 mmol/L (ref 98–111)
Creatinine, Ser: 9.71 mg/dL — ABNORMAL HIGH (ref 0.61–1.24)
GFR, Estimated: 5 mL/min — ABNORMAL LOW (ref >60)
Glucose, Bld: 376 mg/dL — ABNORMAL HIGH (ref 70–99)
Potassium: 4.7 mmol/L (ref 3.5–5.1)
Sodium: 135 mmol/L (ref 135–145)

## 2022-10-26 LAB — LACTIC ACID, PLASMA: Lactic Acid, Venous: >9 mmol/L (ref 0.5–1.9)

## 2022-10-26 LAB — CBG MONITORING, ED: Glucose-Capillary: 243 mg/dL — ABNORMAL HIGH (ref 70–99)

## 2022-10-26 LAB — TROPONIN I (HIGH SENSITIVITY): Troponin I (High Sensitivity): 190 ng/L (ref <18)

## 2022-10-26 MED ORDER — SODIUM BICARBONATE 8.4 % IV SOLN
INTRAVENOUS | Status: AC | PRN
  Administered 2022-10-26: 50 meq via INTRAVENOUS

## 2022-10-26 MED ORDER — EPINEPHRINE 1 MG/10ML IJ SOSY
PREFILLED_SYRINGE | INTRAMUSCULAR | Status: AC | PRN
  Administered 2022-10-26 (×3): 1 mg via INTRAVENOUS

## 2022-10-26 MED ORDER — CALCIUM CHLORIDE 10 % IV SOLN
INTRAVENOUS | Status: AC | PRN
  Administered 2022-10-26: 1 g via INTRAVENOUS

## 2022-10-29 ENCOUNTER — Encounter: Payer: 59 | Admitting: Thoracic Surgery (Cardiothoracic Vascular Surgery)

## 2022-10-30 LAB — I-STAT CHEM 8, ED
BUN: 124 mg/dL — ABNORMAL HIGH (ref 8–23)
Calcium, Ion: 1.05 mmol/L — ABNORMAL LOW (ref 1.15–1.40)
Chloride: 96 mmol/L — ABNORMAL LOW (ref 98–111)
Creatinine, Ser: 10.7 mg/dL — ABNORMAL HIGH (ref 0.61–1.24)
Glucose, Bld: 273 mg/dL — ABNORMAL HIGH (ref 70–99)
HCT: 36 % — ABNORMAL LOW (ref 39.0–52.0)
Hemoglobin: 12.2 g/dL — ABNORMAL LOW (ref 13.0–17.0)
Potassium: 4.6 mmol/L (ref 3.5–5.1)
Sodium: 132 mmol/L — ABNORMAL LOW (ref 135–145)
TCO2: 23 mmol/L (ref 22–32)

## 2022-11-09 NOTE — ED Notes (Signed)
Kerry Underwood Restarted after pulse check and rhythm check

## 2022-11-09 NOTE — ED Notes (Signed)
MD requests compressions to be ongoing

## 2022-11-09 NOTE — Progress Notes (Signed)
Patient expired before it was time for ABG to be drawn.

## 2022-11-09 NOTE — ED Triage Notes (Signed)
Per [facility], the pt called out for respiratory distress with known hx of COPD, while at the scene the pt went unresponsive and pulseless and CPR was initiated, Upon arrival to the ED the pt was asystole and chest compressions were ongoing. Gilford Raid, MD at bedside, ACLS protocol in place, LUCAS applied, see code narrator charting

## 2022-11-09 NOTE — ED Notes (Signed)
No family with the pt, Charge RN  will attempt to notify family, pt transported to the morgue

## 2022-11-09 NOTE — ED Provider Notes (Signed)
[name] EMERGENCY DEPARTMENT Provider Note   CSN: [id number] Arrival date & time: 19-Nov-2022  1553     History  Chief Complaint  Patient presents with   Cardiac Arrest    Kerry Underwood is a 71 y.o. male.  Pt is a 71 yo male with a pmhx significant for ESRD on HD, COPD, HTN, CHF, CVA, and PAD.  Per epic notes, pt has missed a few dialysis sessions due to SOB.  EMS was called out for sob.  They said he could only say 1 or 2 words and was in significant resp distress.  They put him on cpap and he lost pulses.  No meds given, no airway inserted, no cpr.  It is unclear how long he was without a pulse, but they said they were close.  Pt is unable to give any hx as he is unresponsive and asystolic.       Home Medications Prior to Admission medications   Medication Sig Start Date End Date Taking? Authorizing Provider  albuterol (VENTOLIN HFA) 108 (90 Base) MCG/ACT inhaler Inhale 2 puffs into the lungs every 4 (four) hours as needed for wheezing or shortness of breath. 10/14/22   Manuella Ghazi, Pratik D, DO  amLODipine (NORVASC) 5 MG tablet Take 2.5 mg by mouth at bedtime. 09/01/22   [provider]  amoxicillin (AMOXIL) 500 MG capsule Take 1 capsule (500 mg total) by mouth daily for 2 days. 10/24/22 11/19/2022  Elodia Florence., MD  aspirin EC 325 MG tablet Take 325 mg by mouth. Once every 2 days 02/27/22   [provider]  atorvastatin (LIPITOR) 80 MG tablet Take 1 tablet (80 mg total) by mouth daily. 10/25/22 11/24/22  Elodia Florence., MD  azithromycin (ZITHROMAX) 250 MG tablet Take 1 tablet (250 mg total) by mouth daily for 2 days. 10/24/22 November 19, 2022  Elodia Florence., MD  Cholecalciferol 125 MCG (5000 UT) TABS Take by mouth. 02/27/22   [provider]  clopidogrel (PLAVIX) 75 MG tablet Take 75 mg by mouth daily.    [provider]  fluticasone furoate-vilanterol (BREO ELLIPTA) 200-25 MCG/INH AEPB Inhale 1 puff into the lungs daily. 10/14/22    Manuella Ghazi, Pratik D, DO  metoprolol succinate (TOPROL XL) 25 MG 24 hr tablet Take 1 tablet (25 mg total) by mouth daily. 12/20/21   Strader, Fransisco Hertz, PA-C  Multiple Vitamin (MULTIVITAMIN) tablet Take 1 tablet by mouth daily.    [provider]  oxyCODONE-acetaminophen (PERCOCET) 5-325 MG tablet Take 1 tablet by mouth every 6 (six) hours as needed for severe pain. Patient not taking: Reported on 09/12/2022 02/13/22   Rosetta Posner, MD  tamsulosin (FLOMAX) 0.4 MG CAPS capsule Take 0.4 mg by mouth at bedtime. 05/24/20   [provider]  tiotropium (SPIRIVA HANDIHALER) 18 MCG inhalation capsule Place 1 capsule (18 mcg total) into inhaler and inhale daily. 10/14/22   Manuella Ghazi, Pratik D, DO      Allergies    Strawberry (diagnostic)    Review of Systems   Review of Systems  Unable to perform ROS: Patient unresponsive    Physical Exam Updated Vital Signs There were no vitals taken for this visit. Physical Exam Vitals and nursing note reviewed.  Constitutional:      General: He is in acute distress.     Comments: unresponsive  HENT:     Head: Normocephalic and atraumatic.     Right Ear: External ear normal.     Left Ear: External  ear normal.     Nose: Nose normal.     Mouth/Throat:     Mouth: Mucous membranes are dry.  Eyes:     Comments: Pupils fixed  Cardiovascular:     Comments: asystole Pulmonary:     Comments: No spont resp Abdominal:     General: Abdomen is flat.     Palpations: Abdomen is soft.     Comments: Some bruising to lower abd  Musculoskeletal:        General: No deformity.     Cervical back: Normal range of motion and neck supple.     Comments: RUE AVF (no thrill)  Skin:    General: Skin is dry.     Capillary Refill: Capillary refill takes more than 3 seconds.  Neurological:     Mental Status: He is unresponsive.     ED Results / Procedures / Treatments   Labs (all labs ordered are listed, but only abnormal results are displayed) Labs Reviewed   CBC - Abnormal; Notable for the following components:      Result Value   WBC 21.6 (*)    RBC 3.03 (*)    Hemoglobin 10.1 (*)    HCT 32.7 (*)    MCV 107.9 (*)    RDW 16.5 (*)    nRBC 1.3 (*)    All other components within normal limits  CBG MONITORING, ED - Abnormal; Notable for the following components:   Glucose-Capillary 243 (*)    All other components within normal limits  BASIC METABOLIC PANEL  LACTIC ACID, PLASMA  LACTIC ACID, PLASMA  BLOOD GAS, ARTERIAL  I-STAT CHEM 8, ED  TROPONIN I (HIGH SENSITIVITY)    EKG None  Radiology No results found.  Procedures .Central Line  Date/Time: November 11, 2022 4:59 PM  Performed by: Isla Pence, MD Authorized by: Isla Pence, MD   Consent:    Consent obtained:  Emergent situation Universal protocol:    Patient identity confirmed:  Arm band Pre-procedure details:    Indication(s): central venous access and insufficient peripheral access     Hand hygiene: Hand hygiene performed prior to insertion     Sterile barrier technique: All elements of maximal sterile technique followed     Skin preparation:  Chlorhexidine   Skin preparation agent: Skin preparation agent completely dried prior to procedure   Procedure details:    Location:  R femoral   Patient position:  Supine   Procedural supplies:  Triple lumen   Catheter size:  7 Fr   Landmarks identified: yes     Ultrasound guidance: no     Number of attempts:  1   Successful placement: yes   Post-procedure details:    Post-procedure:  Dressing applied and line sutured   Assessment:  Blood return through all ports and free fluid flow   Procedure completion:  Tolerated well, no immediate complications IO LINE INSERTION  Date/Time: 2022-11-11 4:59 PM  Performed by: Isla Pence, MD Authorized by: Isla Pence, MD   Consent:    Consent obtained:  Emergent situation Universal protocol:    Patient identity confirmed:  Arm band Pre-procedure details:    Site  preparation:  Chlorhexidine Anesthesia:    Anesthesia method:  None Procedure details:    Insertion site:  R proximal tibia   Insertion device:  Drill device   Insertion: Needle was inserted through the bony cortex     Number of attempts:  1   Insertion confirmation:  Aspiration of blood/marrow, easy infusion of  fluids and stability of the needle Post-procedure details:    Procedure completion:  Tolerated well, no immediate complications Procedure Name: Intubation Date/Time: Nov 05, 2022 5:00 PM  Performed by: Isla Pence, MDVentilation: Mask ventilation without difficulty Laryngoscope Size: Glidescope and 3 Tube size: 7.5 mm Number of attempts: 1 Placement Confirmation: ETT inserted through vocal cords under direct vision, Positive ETCO2 and Breath sounds checked- equal and bilateral Secured at: 23 cm Tube secured with: ETT holder Dental Injury: Teeth and Oropharynx as per pre-operative assessment         Medications Ordered in ED Medications  EPINEPHrine (ADRENALIN) 1 MG/10ML injection (1 mg Intravenous Given November 05, 2022 1603)  calcium chloride injection (1 g Intravenous Given 2022/11/05 1557)  EPINEPHrine (ADRENALIN) 1 MG/10ML injection (1 mg Intravenous Given 11-05-22 1609)  sodium bicarbonate injection (50 mEq Intravenous Given 11-05-2022 1601)    ED Course/ Medical Decision Making/ A&P                           Medical Decision Making Amount and/or Complexity of Data Reviewed Labs: ordered.  Risk Prescription drug management.   This patient presents to the ED for concern of cardiac arrest, this involves an extensive number of treatment options, and is a complaint that carries with it a high risk of complications and morbidity.  The differential diagnosis includes resp arrest, cardiac arrest   Co morbidities that complicate the patient evaluation   ESRD on HD, COPD, HTN, CHF, CVA, and PAD   Additional history obtained:  Additional history obtained from epic  chart review External records from outside source obtained and reviewed including EMS report   Lab Tests:  I Ordered, and personally interpreted labs.  The pertinent results include:  cbc with wbc elevated at 21.6, hgb 10.1 (chronic); bmp with k 4.7, glucose elevated at 376, bun 90 and cr 9.71; lactic >9, trop elevated at 190     Cardiac Monitoring:  The patient was maintained on a cardiac monitor.  I personally viewed and interpreted the cardiac monitored which showed an underlying rhythm of: asystole   Medicines ordered and prescription drug management:  Pt given several epis, calcium, bicarb, levophed, dopamine, ivfs during code.  Please see nurse documentation I have reviewed the patients home medicines and have made adjustments as needed  Critical Interventions:  CPR, intubation, io, central line    Problem List / ED Course:  Cardiac arrest:  likely secondary to resp arrest.  CPR started upon pt arrival.  Pt bvm'd.  IO placed and pt given several rounds of epi, etc.  Pt intubated.   Central line placed.  Pt's k was nl on the I-stat, but he was given calcium prior to that.  Bedside US done several times to check rhythms.  At one point, he did seem to have an organized rhythm, so the cpr was stopped.  Pt's bp undetectable, so pt started on levophed and then dopamine.  He lost pulses again and code was called at 1633.  Pt's PCP's office called and notified.  I did talk to a friend, Clovis Cao and left a message with Mr. Sallyanne Havers (only number on chart).  He has a daughter, but no one knows her number.    Social Determinants of Health:  Lives alone   Dispostion:  Expired  CRITICAL CARE Performed by: Isla Pence   Total critical care time: 60 minutes  Critical care time was exclusive of separately billable procedures and treating other patients.  Critical care was necessary to treat or prevent imminent or life-threatening deterioration.  Critical care was  time spent personally by me on the following activities: development of treatment plan with patient and/or surrogate as well as nursing, discussions with consultants, evaluation of patient's response to treatment, examination of patient, obtaining history from patient or surrogate, ordering and performing treatments and interventions, ordering and review of laboratory studies, ordering and review of radiographic studies, pulse oximetry and re-evaluation of patient's condition.         Final Clinical Impression(s) / ED Diagnoses Final diagnoses:  Cardiac arrest ([place])  ESRD on hemodialysis [facility])    Rx / DC Orders ED Discharge Orders     None         Isla Pence, MD 11/06/2022 [phone]

## 2022-11-09 NOTE — ED Notes (Signed)
Kerry Underwood restarted

## 2022-11-09 NOTE — ED Notes (Signed)
Kerry Underwood restarted after pulse check

## 2022-11-09 NOTE — ED Notes (Signed)
Verbal order to start Levophed infusion at 20 ml/hr d/t sys BP reading of 37

## 2022-11-09 NOTE — ED Notes (Addendum)
Error in charting.

## 2022-11-09 NOTE — ED Notes (Signed)
R femoral central line started by Isla Pence, MD

## 2022-11-09 NOTE — ED Notes (Signed)
Verbal order to start Levophed infusion at 30 ml/hr d/t sys BP reading of 37

## 2022-11-09 NOTE — ED Notes (Signed)
LUCAS remains in place

## 2022-11-09 NOTE — ED Notes (Signed)
CPR ongoing, pt on [place]

## 2022-11-09 NOTE — ED Notes (Signed)
CPR ongoing per MD, Linton Rump to remain in place

## 2022-11-09 DEATH — deceased
